# Patient Record
Sex: Male | Born: 1965
Health system: Southern US, Community
[De-identification: ages and names within clinical notes are randomized; demographics above are authoritative.]

## PROBLEM LIST (undated history)

## (undated) DIAGNOSIS — G4730 Sleep apnea, unspecified: Secondary | ICD-10-CM

## (undated) DIAGNOSIS — R0683 Snoring: Secondary | ICD-10-CM

## (undated) DIAGNOSIS — M1990 Unspecified osteoarthritis, unspecified site: Secondary | ICD-10-CM

## (undated) DIAGNOSIS — F419 Anxiety disorder, unspecified: Secondary | ICD-10-CM

## (undated) DIAGNOSIS — R569 Unspecified convulsions: Secondary | ICD-10-CM

## (undated) DIAGNOSIS — F32A Depression, unspecified: Secondary | ICD-10-CM

## (undated) DIAGNOSIS — D649 Anemia, unspecified: Secondary | ICD-10-CM

## (undated) DIAGNOSIS — T4145XA Adverse effect of unspecified anesthetic, initial encounter: Secondary | ICD-10-CM

## (undated) DIAGNOSIS — E119 Type 2 diabetes mellitus without complications: Secondary | ICD-10-CM

## (undated) DIAGNOSIS — F329 Major depressive disorder, single episode, unspecified: Secondary | ICD-10-CM

## (undated) DIAGNOSIS — T8859XA Other complications of anesthesia, initial encounter: Secondary | ICD-10-CM

## (undated) DIAGNOSIS — T7840XA Allergy, unspecified, initial encounter: Secondary | ICD-10-CM

## (undated) HISTORY — PX: HERNIA REPAIR: SHX51

## (undated) HISTORY — DX: Allergy, unspecified, initial encounter: T78.40XA

## (undated) HISTORY — PX: OTHER SURGICAL HISTORY: SHX169

## (undated) HISTORY — DX: Major depressive disorder, single episode, unspecified: F32.9

## (undated) HISTORY — DX: Anemia, unspecified: D64.9

## (undated) HISTORY — DX: Anxiety disorder, unspecified: F41.9

## (undated) HISTORY — DX: Depression, unspecified: F32.A

## (undated) HISTORY — DX: Type 2 diabetes mellitus without complications: E11.9

---

## 2006-06-21 ENCOUNTER — Encounter: Admission: RE | Admit: 2006-06-21 | Discharge: 2006-06-21 | Payer: Self-pay | Admitting: Gastroenterology

## 2007-12-03 ENCOUNTER — Encounter: Admission: RE | Admit: 2007-12-03 | Discharge: 2007-12-03 | Payer: Self-pay | Admitting: Family Medicine

## 2008-01-23 HISTORY — PX: SHOULDER SURGERY: SHX246

## 2008-01-23 HISTORY — PX: ELBOW SURGERY: SHX618

## 2008-08-05 ENCOUNTER — Emergency Department (HOSPITAL_COMMUNITY): Admission: EM | Admit: 2008-08-05 | Discharge: 2008-08-06 | Payer: Self-pay | Admitting: Emergency Medicine

## 2009-01-03 ENCOUNTER — Encounter
Admission: RE | Admit: 2009-01-03 | Discharge: 2009-01-12 | Payer: Self-pay | Admitting: Physical Medicine & Rehabilitation

## 2009-01-04 ENCOUNTER — Ambulatory Visit: Payer: Self-pay | Admitting: Physical Medicine & Rehabilitation

## 2009-01-22 HISTORY — PX: KNEE SURGERY: SHX244

## 2009-01-28 ENCOUNTER — Encounter
Admission: RE | Admit: 2009-01-28 | Discharge: 2009-04-28 | Payer: Self-pay | Admitting: Physical Medicine & Rehabilitation

## 2009-01-31 ENCOUNTER — Ambulatory Visit: Payer: Self-pay | Admitting: Physical Medicine & Rehabilitation

## 2009-05-05 ENCOUNTER — Encounter: Admission: RE | Admit: 2009-05-05 | Discharge: 2009-05-05 | Payer: Self-pay | Admitting: Rheumatology

## 2010-01-20 ENCOUNTER — Encounter: Payer: Self-pay | Admitting: Cardiovascular Disease

## 2010-01-20 ENCOUNTER — Ambulatory Visit: Admission: RE | Admit: 2010-01-20 | Discharge: 2010-01-20 | Payer: Self-pay | Source: Home / Self Care

## 2010-01-20 DIAGNOSIS — M79609 Pain in unspecified limb: Secondary | ICD-10-CM | POA: Insufficient documentation

## 2010-01-24 ENCOUNTER — Encounter: Payer: Self-pay | Admitting: Cardiovascular Disease

## 2010-02-23 NOTE — Miscellaneous (Signed)
Summary: Orders Update  Clinical Lists Changes  Problems: Added new problem of LEG PAIN, RIGHT (ICD-729.5) Orders: Added new Test order of Venous Duplex Lower Extremity (Venous Duplex Lower) - Signed

## 2010-02-28 ENCOUNTER — Other Ambulatory Visit: Payer: Self-pay | Admitting: Orthopedic Surgery

## 2010-02-28 ENCOUNTER — Ambulatory Visit
Admission: RE | Admit: 2010-02-28 | Discharge: 2010-02-28 | Disposition: A | Discharge status: Home / Self Care | Payer: BC Managed Care – PPO | Source: Ambulatory Visit | Attending: Orthopedic Surgery | Admitting: Orthopedic Surgery

## 2010-02-28 DIAGNOSIS — M79606 Pain in leg, unspecified: Secondary | ICD-10-CM

## 2010-02-28 DIAGNOSIS — R609 Edema, unspecified: Secondary | ICD-10-CM

## 2010-05-17 ENCOUNTER — Other Ambulatory Visit: Payer: Self-pay | Admitting: Family Medicine

## 2010-05-17 DIAGNOSIS — M545 Low back pain, unspecified: Secondary | ICD-10-CM

## 2010-05-23 ENCOUNTER — Other Ambulatory Visit: Payer: BC Managed Care – PPO

## 2010-06-05 ENCOUNTER — Ambulatory Visit
Admission: RE | Admit: 2010-06-05 | Discharge: 2010-06-05 | Disposition: A | Discharge status: Home / Self Care | Payer: BC Managed Care – PPO | Source: Ambulatory Visit | Attending: Family Medicine | Admitting: Family Medicine

## 2010-06-05 DIAGNOSIS — M545 Low back pain, unspecified: Secondary | ICD-10-CM

## 2011-05-16 ENCOUNTER — Emergency Department (HOSPITAL_COMMUNITY): Payer: BC Managed Care – PPO

## 2011-05-16 ENCOUNTER — Encounter (HOSPITAL_COMMUNITY): Payer: Self-pay | Admitting: Emergency Medicine

## 2011-05-16 ENCOUNTER — Emergency Department (HOSPITAL_COMMUNITY)
Admission: EM | Admit: 2011-05-16 | Discharge: 2011-05-16 | Disposition: A | Discharge status: Home Health | Payer: BC Managed Care – PPO | Attending: Emergency Medicine | Admitting: Emergency Medicine

## 2011-05-16 DIAGNOSIS — R05 Cough: Secondary | ICD-10-CM | POA: Insufficient documentation

## 2011-05-16 DIAGNOSIS — R509 Fever, unspecified: Secondary | ICD-10-CM

## 2011-05-16 DIAGNOSIS — I10 Essential (primary) hypertension: Secondary | ICD-10-CM

## 2011-05-16 DIAGNOSIS — R059 Cough, unspecified: Secondary | ICD-10-CM | POA: Insufficient documentation

## 2011-05-16 MED ORDER — ACETAMINOPHEN 500 MG PO TABS
1000.0000 mg | ORAL_TABLET | Freq: Once | ORAL | Status: AC
  Administered 2011-05-16: 1000 mg via ORAL
  Filled 2011-05-16: qty 2

## 2011-05-16 MED ORDER — AMOXICILLIN 500 MG PO CAPS: 500.0000 mg | ORAL_CAPSULE | Freq: Three times a day (TID) | ORAL | Status: AC

## 2011-05-16 NOTE — Discharge Instructions (Signed)
The x-ray did not show a definite pneumonia but it is possible that it is still too early to show up. Take antibiotics as prescribed. Follow up with your doctor if not getting any better. Return to emergency room for worsening symptoms   Fever, Adult A fever is a higher than normal body temperature. In an adult, an oral temperature around 98.6 F (37 C) is considered normal. A temperature of 100.4 F (38 C) or higher is generally considered a fever. Mild or moderate fevers generally have no long-term effects and often do not require treatment. Extreme fever (greater than or equal to 106 F or 41.1 C) can cause seizures. The sweating that may occur with repeated or prolonged fever may cause dehydration. Elderly people can develop confusion during a fever. A measured temperature can vary with:  Age.   Time of day.   Method of measurement (mouth, underarm, rectal, or ear).  The fever is confirmed by taking a temperature with a thermometer. Temperatures can be taken different ways. Some methods are accurate and some are not.  An oral temperature is used most commonly. Electronic thermometers are fast and accurate.   An ear temperature will only be accurate if the thermometer is positioned as recommended by the manufacturer.   A rectal temperature is accurate and done for those adults who have a condition where an oral temperature cannot be taken.   An underarm (axillary) temperature is not accurate and not recommended.  Fever is a symptom, not a disease.  CAUSES   Infections commonly cause fever.   Some noninfectious causes for fever include:   Some arthritis conditions.   Some thyroid or adrenal gland conditions.   Some immune system conditions.   Some types of cancer.   A medicine reaction.   High doses of certain street drugs such as methamphetamine.   Dehydration.   Exposure to high outside or room temperatures.   Occasionally, the source of a fever cannot be  determined. This is sometimes called a "fever of unknown origin" (FUO).   Some situations may lead to a temporary rise in body temperature that may go away on its own. Examples are:   Childbirth.   Surgery.   Intense exercise.  HOME CARE INSTRUCTIONS   Take appropriate medicines for fever. Follow dosing instructions carefully. If you use acetaminophen to reduce the fever, be careful to avoid taking other medicines that also contain acetaminophen. Do not take aspirin for a fever if you are younger than age 45. There is an association with Reye's syndrome. Reye's syndrome is a rare but potentially deadly disease.   If an infection is present and antibiotics have been prescribed, take them as directed. Finish them even if you start to feel better.   Rest as needed.   Maintain an adequate fluid intake. To prevent dehydration during an illness with prolonged or recurrent fever, you may need to drink extra fluid.Drink enough fluids to keep your urine clear or pale yellow.   Sponging or bathing with room temperature water may help reduce body temperature. Do not use ice water or alcohol sponge baths.   Dress comfortably, but do not over-bundle.  SEEK MEDICAL CARE IF:   You are unable to keep fluids down.   You develop vomiting or diarrhea.   You are not feeling at least partly better after 3 days.   You develop new symptoms or problems.  SEEK IMMEDIATE MEDICAL CARE IF:   You have shortness of breath or trouble breathing.  You develop excessive weakness.   You are dizzy or you faint.   You are extremely thirsty or you are making little or no urine.   You develop new pain that was not there before (such as in the head, neck, chest, back, or abdomen).   You have persistant vomiting and diarrhea for more than 1 to 2 days.   You develop a stiff neck or your eyes become sensitive to light.   You develop a skin rash.   You have a fever or persistent symptoms for more than 2 to 3  days.   You have a fever and your symptoms suddenly get worse.  MAKE SURE YOU:   Understand these instructions.   Will watch your condition.   Will get help right away if you are not doing well or get worse.  Document Released: 07/04/2000 Document Revised: 12/28/2010 Document Reviewed: 11/09/2010 [facility] Patient Information 2012 [place], [place].Pneumonia, Adult Pneumonia is an infection of the lungs.  CAUSES Pneumonia may be caused by bacteria or a virus. Usually, these infections are caused by breathing infectious particles into the lungs (respiratory tract). SYMPTOMS   Cough.   Fever.   Chest pain.   Increased rate of breathing.   Wheezing.   Mucus production.  DIAGNOSIS  If you have the common symptoms of pneumonia, your caregiver will typically confirm the diagnosis with a chest X-ray. The X-ray will show an abnormality in the lung (pulmonary infiltrate) if you have pneumonia. Other tests of your blood, urine, or sputum may be done to find the specific cause of your pneumonia. Your caregiver may also do tests (blood gases or pulse oximetry) to see how well your lungs are working. TREATMENT  Some forms of pneumonia may be spread to other people when you cough or sneeze. You may be asked to wear a mask before and during your exam. Pneumonia that is caused by bacteria is treated with antibiotic medicine. Pneumonia that is caused by the influenza virus may be treated with an antiviral medicine. Most other viral infections must run their course. These infections will not respond to antibiotics.  PREVENTION A pneumococcal shot (vaccine) is available to prevent a common bacterial cause of pneumonia. This is usually suggested for:  People over 50 years old.   Patients on chemotherapy.   People with chronic lung problems, such as bronchitis or emphysema.   People with immune system problems.  If you are over 65 or have a high risk condition, you may receive the pneumococcal  vaccine if you have not received it before. In some countries, a routine influenza vaccine is also recommended. This vaccine can help prevent some cases of pneumonia.You may be offered the influenza vaccine as part of your care. If you smoke, it is time to quit. You may receive instructions on how to stop smoking. Your caregiver can provide medicines and counseling to help you quit. HOME CARE INSTRUCTIONS   Cough suppressants may be used if you are losing too much rest. However, coughing protects you by clearing your lungs. You should avoid using cough suppressants if you can.   Your caregiver may have prescribed medicine if he or she thinks your pneumonia is caused by a bacteria or influenza. Finish your medicine even if you start to feel better.   Your caregiver may also prescribe an expectorant. This loosens the mucus to be coughed up.   Only take over-the-counter or prescription medicines for pain, discomfort, or fever as directed by your caregiver.  Do not smoke. Smoking is a common cause of bronchitis and can contribute to pneumonia. If you are a smoker and continue to smoke, your cough may last several weeks after your pneumonia has cleared.   A cold steam vaporizer or humidifier in your room or home may help loosen mucus.   Coughing is often worse at night. Sleeping in a semi-upright position in a recliner or using a couple pillows under your head will help with this.   Get rest as you feel it is needed. Your body will usually let you know when you need to rest.  SEEK IMMEDIATE MEDICAL CARE IF:   Your illness becomes worse. This is especially true if you are elderly or weakened from any other disease.   You cannot control your cough with suppressants and are losing sleep.   You begin coughing up blood.   You develop pain which is getting worse or is uncontrolled with medicines.   You have a fever.   Any of the symptoms which initially brought you in for treatment are getting  worse rather than better.   You develop shortness of breath or chest pain.  MAKE SURE YOU:   Understand these instructions.   Will watch your condition.   Will get help right away if you are not doing well or get worse.  Document Released: 01/08/2005 Document Revised: 12/28/2010 Document Reviewed: 03/30/2010 [facility] Patient Information 2012 [place], [place].

## 2011-05-16 NOTE — ED Notes (Signed)
AVW:[id number]<XB> Expected date:05/16/11<BR> Expected time:<BR> Means of arrival:<BR> Comments:<BR> EMS 80 GC - fever

## 2011-05-16 NOTE — ED Notes (Signed)
Pt states he has had a temp of 104 for 4 days. Has taken tylenol, ibuprofen, etc... But fever continues to return. Burns up and sweats and has chills. Has been drinking water non-stop. Went to PCP today but states that they said it could just be bronchitis. States he just got back from [place] and there was a hispanic man infront of him that was coughing continually. Last ibuprofen was at 805

## 2011-05-16 NOTE — ED Provider Notes (Signed)
History     CSN: [id number]  Arrival date & time 05/16/11  2044   First MD Initiated Contact with Patient 05/16/11 2058      Chief Complaint  Patient presents with  . Fever     HPI Pt has been having fever for the last few days.  Pt went to see his doctor today and was started empricially on a z pack.  He has been having some cough.  No vomiting, diarrhea, dysuria.  No headache or neck stiffness.  No foreign travel.  He was exposed to a man that was coughing a lot [place] the [place].  Pt normally does not have high blood pressure.  He has been taking ibuprofen without relief. History reviewed. No pertinent past medical history.  Past Surgical History  Procedure Date  . Knee surgery   . Back surgery     No family history on file.  History  Substance Use Topics  . Smoking status: Not on file  . Smokeless tobacco: Not on file  . Alcohol Use:       Review of Systems  All other systems reviewed and are negative.    Allergies  Review of patient's allergies indicates no known allergies.  Home Medications  No current outpatient prescriptions on file.  BP 200/20  Pulse 110  Temp(Src) 102.8 F (39.3 C) (Tympanic)  Resp 18  SpO2 95%  Physical Exam  Nursing note and vitals reviewed. Constitutional: He appears well-developed and well-nourished. No distress.  HENT:  Head: Normocephalic and atraumatic.  Right Ear: External ear normal.  Left Ear: External ear normal.  Eyes: Conjunctivae are normal. Right eye exhibits no discharge. Left eye exhibits no discharge. No scleral icterus.  Neck: Neck supple. No tracheal deviation present.  Cardiovascular: Normal rate, regular rhythm and intact distal pulses.   Pulmonary/Chest: Effort normal. No stridor. No respiratory distress. He has no wheezes. He has rales in the left middle field and the left lower field.  Abdominal: Soft. Bowel sounds are normal. He exhibits no distension. There is no tenderness. There is no rebound and no  guarding.  Musculoskeletal: He exhibits no edema and no tenderness.  Neurological: He is alert. He has normal strength. No sensory deficit. Cranial nerve deficit:  no gross defecits noted. He exhibits normal muscle tone. He displays no seizure activity. Coordination normal.  Skin: Skin is warm and dry. No rash noted.  Psychiatric: He has a normal mood and affect.    ED Course  Procedures (including critical care time)  Labs Reviewed - No data to display Dg Chest 2 View  05/16/2011  *RADIOLOGY REPORT*  Clinical Data: Fever, cough, and shortness of breath for 2 days. Smoker.  CHEST - 2 VIEW  Comparison: None.  Findings: Normal heart size and pulmonary vascularity.  Patchy central interstitial changes in the lungs which might represent fibrosis or chronic bronchitic changes.  No focal airspace consolidation in the lungs.  No blunting of costophrenic angles. No pneumothorax.  IMPRESSION: Fibrosis versus chronic bronchitic changes in the lungs.  No evidence of active pulmonary disease.  Original Report Authenticated By: Marlon Pel, M.D.     1. Fever       MDM  Clinically the patient appears to have a left lower lobe pneumonia. He has crackles on exam in the left lower lung.  He has been having fevers as well as a cough. Patient denies any other symptoms to suggest an alternative etiology. He was noted to be hypertensive here in  the emergency room. He is asymptomatic with this and I have encouraged to follow up with his PCP about this.        Celene Kras, MD 05/16/11 2258

## 2011-10-29 ENCOUNTER — Encounter (HOSPITAL_COMMUNITY): Payer: Self-pay | Admitting: *Deleted

## 2011-10-29 ENCOUNTER — Observation Stay (HOSPITAL_COMMUNITY)
Admission: EM | Admit: 2011-10-29 | Discharge: 2011-10-31 | Disposition: A | Discharge status: Home / Self Care | Payer: BC Managed Care – PPO | Attending: Internal Medicine | Admitting: Internal Medicine

## 2011-10-29 DIAGNOSIS — M549 Dorsalgia, unspecified: Secondary | ICD-10-CM | POA: Insufficient documentation

## 2011-10-29 DIAGNOSIS — F17200 Nicotine dependence, unspecified, uncomplicated: Secondary | ICD-10-CM | POA: Insufficient documentation

## 2011-10-29 DIAGNOSIS — R739 Hyperglycemia, unspecified: Secondary | ICD-10-CM | POA: Diagnosis present

## 2011-10-29 DIAGNOSIS — J189 Pneumonia, unspecified organism: Principal | ICD-10-CM | POA: Diagnosis present

## 2011-10-29 DIAGNOSIS — M79609 Pain in unspecified limb: Secondary | ICD-10-CM

## 2011-10-29 DIAGNOSIS — Z79899 Other long term (current) drug therapy: Secondary | ICD-10-CM | POA: Insufficient documentation

## 2011-10-29 DIAGNOSIS — E119 Type 2 diabetes mellitus without complications: Secondary | ICD-10-CM | POA: Diagnosis present

## 2011-10-29 DIAGNOSIS — IMO0002 Reserved for concepts with insufficient information to code with codable children: Secondary | ICD-10-CM | POA: Insufficient documentation

## 2011-10-29 DIAGNOSIS — G8929 Other chronic pain: Secondary | ICD-10-CM | POA: Insufficient documentation

## 2011-10-29 DIAGNOSIS — J441 Chronic obstructive pulmonary disease with (acute) exacerbation: Secondary | ICD-10-CM | POA: Insufficient documentation

## 2011-10-29 LAB — COMPREHENSIVE METABOLIC PANEL
ALT: 66 U/L — ABNORMAL HIGH (ref 0–53)
Albumin: 3.9 g/dL (ref 3.5–5.2)
Alkaline Phosphatase: 133 U/L — ABNORMAL HIGH (ref 39–117)
BUN: 7 mg/dL (ref 6–23)
CO2: 29 mEq/L (ref 19–32)
Calcium: 9.2 mg/dL (ref 8.4–10.5)
Chloride: 99 mEq/L (ref 96–112)
Creatinine, Ser: 0.85 mg/dL (ref 0.50–1.35)
Total Bilirubin: 0.6 mg/dL (ref 0.3–1.2)
Total Protein: 7.4 g/dL (ref 6.0–8.3)

## 2011-10-29 LAB — CBC WITH DIFFERENTIAL/PLATELET
Lymphocytes Relative: 14 % (ref 12–46)
Lymphs Abs: 1.6 10*3/uL (ref 0.7–4.0)
MCH: 31 pg (ref 26.0–34.0)
MCHC: 34.3 g/dL (ref 30.0–36.0)
Monocytes Absolute: 0.6 10*3/uL (ref 0.1–1.0)
Platelets: 145 10*3/uL — ABNORMAL LOW (ref 150–400)
RBC: 4.77 MIL/uL (ref 4.22–5.81)
RDW: 12.9 % (ref 11.5–15.5)
WBC: 12 10*3/uL — ABNORMAL HIGH (ref 4.0–10.5)

## 2011-10-29 NOTE — ED Notes (Signed)
Pulse ox consistently staying between 96-97% while ambulating

## 2011-10-29 NOTE — ED Notes (Signed)
Chest pain for 2 weeks he has been seen at numerus places for the same and was seen today at [facility] walk-in and was diagnosed with pneumonia.

## 2011-10-29 NOTE — ED Provider Notes (Signed)
History     CSN: [id number]  Arrival date & time 10/29/11  1943   First MD Initiated Contact with Patient 10/29/11 2259      Chief Complaint  Patient presents with  . Chest Pain    (Consider location/radiation/quality/duration/timing/severity/associated sxs/prior treatment) HPI HX per PT cough and not feeling well last week, went to [place] walk in clinic and had an xray, was told he has bronchitis and started on Avelox. Finished RX and today woke with HA, temp to 103 and mild cough. He saw PCP at [facility] again, had another xray. PCP called, told him he has PNA and was prescribed Augmentin. Tonight labored breathing and L sided sharp CP worse with  Breathing, called PCP and referred here. Using albuterol with no sig relief. Has arthritis no other medical issues.    History reviewed. No pertinent past medical history.  Past Surgical History  Procedure Date  . Knee surgery   . Back surgery     No family history on file.  History  Substance Use Topics  . Smoking status: Current Every Day Smoker  . Smokeless tobacco: Not on file  . Alcohol Use: No      Review of Systems  Constitutional: Negative for fever and chills.  HENT: Negative for neck pain and neck stiffness.   Eyes: Negative for pain.  Respiratory: Positive for cough and shortness of breath.   Cardiovascular: Positive for chest pain.  Gastrointestinal: Negative for vomiting and abdominal pain.  Genitourinary: Negative for dysuria.  Musculoskeletal: Negative for back pain.  Skin: Negative for rash.  Neurological: Negative for headaches.  All other systems reviewed and are negative.    Allergies  Indocin and Zanaflex  Home Medications   Current Outpatient Rx  Name Route Sig Dispense Refill  . ALBUTEROL SULFATE HFA 108 (90 BASE) MCG/ACT IN AERS Inhalation Inhale 2 puffs into the lungs every 6 (six) hours as needed. For shortness of breath    . ALLOPURINOL 300 MG PO TABS Oral Take 300 mg by mouth daily.    Marland Kitchen  ALPRAZOLAM 1 MG PO TABS Oral Take 1 mg by mouth at bedtime as needed. For anxiety    . AMOXICILLIN-POT CLAVULANATE 875-125 MG PO TABS Oral Take 1 tablet by mouth 2 (two) times daily.    . BUMETANIDE 0.5 MG PO TABS Oral Take 0.5 mg by mouth daily.    . CYCLOBENZAPRINE HCL 10 MG PO TABS Oral Take 10 mg by mouth 3 (three) times daily as needed. For muscle spasms    . DULOXETINE HCL 60 MG PO CPEP Oral Take 60 mg by mouth 2 (two) times daily.    Marland Kitchen MIRTAZAPINE 30 MG PO TABS Oral Take 30 mg by mouth at bedtime.    . OXYCODONE HCL ER 80 MG PO TB12 Oral Take 160 mg by mouth every 12 (twelve) hours.    . OXYCODONE HCL 30 MG PO TABS Oral Take 30 mg by mouth every 4 (four) hours as needed. For breakthrough pain    . PREDNISONE 5 MG PO TABS Oral Take 15 mg by mouth daily.      BP 146/81  Pulse 95  Temp 98 F (36.7 C) (Oral)  Resp 20  SpO2 92%  Physical Exam  Constitutional: He is oriented to person, place, and time. He appears well-developed and well-nourished.  HENT:  Head: Normocephalic and atraumatic.  Eyes: Conjunctivae normal and EOM are normal. Pupils are equal, round, and reactive to light.  Neck: Trachea normal. Neck  supple. No thyromegaly present.  Cardiovascular: Normal rate, regular rhythm, S1 normal, S2 normal and normal pulses.     No systolic murmur is present   No diastolic murmur is present  Pulses:      Radial pulses are 2+ on the right side, and 2+ on the left side.  Pulmonary/Chest: Effort normal. He has no wheezes. He has no rhonchi. He has no rales. He exhibits no tenderness.       Course bilateral breath sounds  Abdominal: Soft. Normal appearance and bowel sounds are normal. There is no tenderness. There is no rebound, no guarding, no CVA tenderness and negative Murphy's sign.  Musculoskeletal:       BLE:s Calves nontender, no cords or erythema, negative Homans sign  Neurological: He is alert and oriented to person, place, and time. He has normal strength. No cranial nerve  deficit or sensory deficit. GCS eye subscore is 4. GCS verbal subscore is 5. GCS motor subscore is 6.  Skin: Skin is warm and dry. No rash noted. He is not diaphoretic.  Psychiatric: His speech is normal.       Cooperative and appropriate    ED Course  Procedures (including critical care time)  Results for orders placed during the hospital encounter of 10/29/11  CBC WITH DIFFERENTIAL      Component Value Range   WBC 12.0 (*) 4.0 - 10.5 K/uL   RBC 4.77  4.22 - 5.81 MIL/uL   Hemoglobin 14.8  13.0 - 17.0 g/dL   HCT 19.1  47.8 - 29.5 %   MCV 90.4  78.0 - 100.0 fL   MCH 31.0  26.0 - 34.0 pg   MCHC 34.3  30.0 - 36.0 g/dL   RDW 62.1  30.8 - 65.7 %   Platelets 145 (*) 150 - 400 K/uL   Neutrophils Relative 80 (*) 43 - 77 %   Neutro Abs 9.7 (*) 1.7 - 7.7 K/uL   Lymphocytes Relative 14  12 - 46 %   Lymphs Abs 1.6  0.7 - 4.0 K/uL   Monocytes Relative 5  3 - 12 %   Monocytes Absolute 0.6  0.1 - 1.0 K/uL   Eosinophils Relative 1  0 - 5 %   Eosinophils Absolute 0.1  0.0 - 0.7 K/uL   Basophils Relative 0  0 - 1 %   Basophils Absolute 0.0  0.0 - 0.1 K/uL  COMPREHENSIVE METABOLIC PANEL      Component Value Range   Sodium 138  135 - 145 mEq/L   Potassium 4.2  3.5 - 5.1 mEq/L   Chloride 99  96 - 112 mEq/L   CO2 29  19 - 32 mEq/L   Glucose, Bld 260 (*) 70 - 99 mg/dL   BUN 7  6 - 23 mg/dL   Creatinine, Ser 8.46  0.50 - 1.35 mg/dL   Calcium 9.2  8.4 - 96.2 mg/dL   Total Protein 7.4  6.0 - 8.3 g/dL   Albumin 3.9  3.5 - 5.2 g/dL   AST 49 (*) 0 - 37 U/L   ALT 66 (*) 0 - 53 U/L   Alkaline Phosphatase 133 (*) 39 - 117 U/L   Total Bilirubin 0.6  0.3 - 1.2 mg/dL   GFR calc non Af Amer >90  >90 mL/min   GFR calc Af Amer >90  >90 mL/min  POCT I-STAT TROPONIN I      Component Value Range   Troponin i, poc 0.00  0.00 - 0.08 ng/mL  Comment 3            Ct Angio Chest Pe W/cm &/or Wo Cm  10/30/2011  *RADIOLOGY REPORT*  Clinical Data: Chest pain shortness of breath, recent diagnosis of pneumonia   CT ANGIOGRAPHY CHEST  Technique:  Multidetector CT imaging of the chest using the standard protocol during bolus administration of intravenous contrast. Multiplanar reconstructed images including MIPs were obtained and reviewed to evaluate the vascular anatomy.  Contrast: OMNIPAQUE IOHEXOL 350 MG/ML SOLN  Comparison: Chest radiograph - 10/29/2011; 10/14/2011  Vascular Findings:  There is adequate opacification of the pulmonary vasculature in the main pulmonary artery measuring 250 HU.  There are no discrete filling defects within the pulmonary arterial tree to the level of the bilateral subsegmental pulmonary arteries.  Evaluation of the distal subsegmental pulmonary arteries is degraded secondary to suboptimal vessel opacification.  Normal caliber of the main pulmonary artery.  Normal heart size.  No pericardial effusion.  Normal caliber of the thoracic aorta.  Normal configuration of the aortic arch. Visualized portions of the cervical vasculature are patent.  No thoracic aortic dissection.  No periaortic stranding.  Nonvascular findings:  Ill-defined heterogeneous air space opacities within the left lower lung (images 57, 67 and 68, series 6) correlate with the findings on recent chest radiograph and are worrisome for infection. Bibasilar dependent ground-glass opacities compatible with atelectasis.  No pleural effusion or pneumothorax.  The central airways appear patent.  Scattered shoddy mediastinal lymph nodes are not enlarged by CT criteria with index pretracheal node measuring 8 mm in short axis diameter (image 34, series 5), not enlarged by CT criteria. Borderline enlarged right hilar lymph node measures approximately 1.1 cm in short axis diameter (image 41).  No definite left hilar adenopathy.  No axillary adenopathy.  Incidental imaging of the upper abdomen demonstrates diffuse decreased attenuation of the hepatic parenchyma suggestive of hepatic steatosis.  Possible sub centimeter nodule within the  left lobe of the thyroid lobe ( image 5).  No acute or aggressive osseous abnormalities.  IMPRESSION:  1.  Negative for pulmonary embolism to the level of the bilateral subsegmental pulmonary arteries. 2.  Ill-defined left lower lobe heterogeneous air space opacities correlate with the findings on recent chest radiograph and are worrisome for infection.   A follow-up chest radiograph in 4 to 6 weeks after treatment is recommended to ensure resolution.  3.  Shoddy mediastinal and right hilar lymph nodes, presumably reactive in etiology. 4.  Hepatic steatosis suspected.   Original Report Authenticated By: Waynard Reeds, M.D.      Date: 10/29/2011  Rate: 89  Rhythm: normal sinus rhythm  QRS Axis: normal  Intervals: normal  ST/T Wave abnormalities: nonspecific ST changes  Conduction Disutrbances:none  Narrative Interpretation:   Old EKG Reviewed: none available  Labs reviewed - PT aware he has had elevated LFTs in the past, no ABD pain or RUQ pain.   Labs and imaging reviewed. 2:23 AM d/w [facility] hospitalist on call, concern for PNA despite full course of avelox - failed outpatient management. Plan admit IV ABx. Bed request placed, DR Onalee Hua to evaluate in the ED.   MDM   Cough, fever, finished Avelox and now placed on Augment for PNA. Temp 103 at home. CT and labs as above. VS and nursing notes reviewed.         Sunnie Nielsen, MD 10/30/11 [phone]

## 2011-10-30 ENCOUNTER — Emergency Department (HOSPITAL_COMMUNITY): Payer: BC Managed Care – PPO

## 2011-10-30 ENCOUNTER — Encounter (HOSPITAL_COMMUNITY): Payer: Self-pay | Admitting: Radiology

## 2011-10-30 DIAGNOSIS — M79609 Pain in unspecified limb: Secondary | ICD-10-CM

## 2011-10-30 DIAGNOSIS — J189 Pneumonia, unspecified organism: Secondary | ICD-10-CM | POA: Diagnosis present

## 2011-10-30 LAB — GLUCOSE, CAPILLARY: Glucose-Capillary: 116 mg/dL — ABNORMAL HIGH (ref 70–99)

## 2011-10-30 LAB — LEGIONELLA ANTIGEN, URINE

## 2011-10-30 LAB — CBC WITH DIFFERENTIAL/PLATELET
Eosinophils Absolute: 0.1 10*3/uL (ref 0.0–0.7)
Eosinophils Relative: 1 % (ref 0–5)
Hemoglobin: 13.9 g/dL (ref 13.0–17.0)
Lymphs Abs: 3.2 10*3/uL (ref 0.7–4.0)
MCH: 31.2 pg (ref 26.0–34.0)
MCV: 90.8 fL (ref 78.0–100.0)
Monocytes Absolute: 0.6 10*3/uL (ref 0.1–1.0)
Monocytes Relative: 6 % (ref 3–12)
RBC: 4.46 MIL/uL (ref 4.22–5.81)

## 2011-10-30 LAB — BASIC METABOLIC PANEL
CO2: 27 mEq/L (ref 19–32)
Glucose, Bld: 197 mg/dL — ABNORMAL HIGH (ref 70–99)
Potassium: 3.7 mEq/L (ref 3.5–5.1)
Sodium: 136 mEq/L (ref 135–145)

## 2011-10-30 LAB — HEMOGLOBIN A1C: Mean Plasma Glucose: 154 mg/dL — ABNORMAL HIGH (ref <117)

## 2011-10-30 MED ORDER — MIRTAZAPINE 30 MG PO TABS
30.0000 mg | ORAL_TABLET | Freq: Every day | ORAL | Status: DC
  Administered 2011-10-30: 30 mg via ORAL
  Filled 2011-10-30 (×2): qty 1

## 2011-10-30 MED ORDER — INSULIN ASPART 100 UNIT/ML ~~LOC~~ SOLN
0.0000 [IU] | Freq: Three times a day (TID) | SUBCUTANEOUS | Status: DC
  Administered 2011-10-31: 2 [IU] via SUBCUTANEOUS

## 2011-10-30 MED ORDER — PREDNISONE 10 MG PO TABS
15.0000 mg | ORAL_TABLET | Freq: Every day | ORAL | Status: DC
Start: 2011-10-30 — End: 2011-10-30
  Administered 2011-10-30: 15 mg via ORAL
  Filled 2011-10-30 (×2): qty 1

## 2011-10-30 MED ORDER — SODIUM CHLORIDE 0.9 % IJ SOLN
3.0000 mL | Freq: Two times a day (BID) | INTRAMUSCULAR | Status: DC
  Administered 2011-10-30: 3 mL via INTRAVENOUS

## 2011-10-30 MED ORDER — OXYCODONE HCL 40 MG PO TB12
160.0000 mg | ORAL_TABLET | Freq: Two times a day (BID) | ORAL | Status: DC
  Administered 2011-10-30 – 2011-10-31 (×3): 160 mg via ORAL
  Filled 2011-10-30 (×3): qty 4

## 2011-10-30 MED ORDER — VANCOMYCIN HCL 1000 MG IV SOLR
1250.0000 mg | Freq: Three times a day (TID) | INTRAVENOUS | Status: DC
  Filled 2011-10-30 (×2): qty 1250

## 2011-10-30 MED ORDER — CYCLOBENZAPRINE HCL 10 MG PO TABS
10.0000 mg | ORAL_TABLET | Freq: Three times a day (TID) | ORAL | Status: DC | PRN
  Administered 2011-10-30: 10 mg via ORAL
  Filled 2011-10-30: qty 1

## 2011-10-30 MED ORDER — BUMETANIDE 0.5 MG PO TABS
0.5000 mg | ORAL_TABLET | Freq: Every day | ORAL | Status: DC
  Administered 2011-10-30 – 2011-10-31 (×2): 0.5 mg via ORAL
  Filled 2011-10-30 (×2): qty 1

## 2011-10-30 MED ORDER — AZITHROMYCIN 500 MG IV SOLR
500.0000 mg | INTRAVENOUS | Status: DC
  Administered 2011-10-30: 500 mg via INTRAVENOUS
  Filled 2011-10-30 (×2): qty 500

## 2011-10-30 MED ORDER — PREDNISONE 20 MG PO TABS
40.0000 mg | ORAL_TABLET | Freq: Every day | ORAL | Status: DC
  Administered 2011-10-31: 40 mg via ORAL
  Filled 2011-10-30 (×3): qty 2

## 2011-10-30 MED ORDER — ALPRAZOLAM 0.5 MG PO TABS
1.0000 mg | ORAL_TABLET | Freq: Every evening | ORAL | Status: DC | PRN
  Administered 2011-10-30: 1 mg via ORAL
  Filled 2011-10-30: qty 2

## 2011-10-30 MED ORDER — SODIUM CHLORIDE 0.9 % IV SOLN
250.0000 mL | INTRAVENOUS | Status: DC | PRN
  Administered 2011-10-30: 250 mL via INTRAVENOUS

## 2011-10-30 MED ORDER — ALLOPURINOL 300 MG PO TABS
300.0000 mg | ORAL_TABLET | Freq: Every day | ORAL | Status: DC
  Administered 2011-10-30 – 2011-10-31 (×2): 300 mg via ORAL
  Filled 2011-10-30 (×2): qty 1

## 2011-10-30 MED ORDER — OXYCODONE HCL 5 MG PO TABS: 30.0000 mg | ORAL_TABLET | ORAL | Status: DC | PRN

## 2011-10-30 MED ORDER — IOHEXOL 350 MG/ML SOLN
100.0000 mL | Freq: Once | INTRAVENOUS | Status: AC | PRN
  Administered 2011-10-30: 100 mL via INTRAVENOUS

## 2011-10-30 MED ORDER — CEFTRIAXONE SODIUM 1 G IJ SOLR
1.0000 g | INTRAMUSCULAR | Status: DC
  Administered 2011-10-30: 1 g via INTRAVENOUS
  Filled 2011-10-30 (×2): qty 10

## 2011-10-30 MED ORDER — SODIUM CHLORIDE 0.9 % IJ SOLN: 3.0000 mL | INTRAMUSCULAR | Status: DC | PRN

## 2011-10-30 MED ORDER — SODIUM CHLORIDE 0.9 % IV SOLN: INTRAVENOUS | Status: DC

## 2011-10-30 MED ORDER — DULOXETINE HCL 60 MG PO CPEP
60.0000 mg | ORAL_CAPSULE | Freq: Two times a day (BID) | ORAL | Status: DC
  Administered 2011-10-30 – 2011-10-31 (×3): 60 mg via ORAL
  Filled 2011-10-30 (×4): qty 1

## 2011-10-30 MED ORDER — OXYCODONE HCL 80 MG PO TB12: 160.0000 mg | ORAL_TABLET | Freq: Two times a day (BID) | ORAL | Status: DC

## 2011-10-30 MED ORDER — VANCOMYCIN HCL IN DEXTROSE 1-5 GM/200ML-% IV SOLN: 1000.0000 mg | Freq: Once | INTRAVENOUS | Status: DC

## 2011-10-30 MED ORDER — PIPERACILLIN-TAZOBACTAM 3.375 G IVPB: 3.3750 g | Freq: Once | INTRAVENOUS | Status: DC

## 2011-10-30 MED ORDER — VANCOMYCIN HCL 1000 MG IV SOLR
2000.0000 mg | Freq: Once | INTRAVENOUS | Status: AC
  Administered 2011-10-30: 2000 mg via INTRAVENOUS
  Filled 2011-10-30: qty 2000

## 2011-10-30 MED ORDER — PIPERACILLIN-TAZOBACTAM 3.375 G IVPB
3.3750 g | Freq: Three times a day (TID) | INTRAVENOUS | Status: DC
  Administered 2011-10-30: 3.375 g via INTRAVENOUS
  Filled 2011-10-30 (×3): qty 50

## 2011-10-30 NOTE — H&P (Signed)
PCP:   Lenora Boys, MD   Chief Complaint:  Fever, cough, cp  HPI: 46 yo male tx for pna with 10 days of avelox finished several days ago comes in with fever 103 yesterday and general malaise since yesterday.  Ct chest today shows pna.  No other med issues except arthritis.  Otherwise doing well.  No n/v/d.  No abd pain.  Some mild pleuritic cp associated with cough.    Review of Systems:  O/w neg  Past Medical History: History reviewed. No pertinent past medical history. Past Surgical History  Procedure Date  . Knee surgery   . Back surgery     Medications: Prior to Admission medications   Medication Sig Start Date End Date Taking? Authorizing Provider  albuterol (PROVENTIL HFA;VENTOLIN HFA) 108 (90 BASE) MCG/ACT inhaler Inhale 2 puffs into the lungs every 6 (six) hours as needed. For shortness of breath   Yes Historical Provider, MD  allopurinol (ZYLOPRIM) 300 MG tablet Take 300 mg by mouth daily.   Yes Historical Provider, MD  ALPRAZolam Prudy Feeler) 1 MG tablet Take 1 mg by mouth at bedtime as needed. For anxiety   Yes Historical Provider, MD  amoxicillin-clavulanate (AUGMENTIN) 875-125 MG per tablet Take 1 tablet by mouth 2 (two) times daily.   Yes Historical Provider, MD  bumetanide (BUMEX) 0.5 MG tablet Take 0.5 mg by mouth daily.   Yes Historical Provider, MD  cyclobenzaprine (FLEXERIL) 10 MG tablet Take 10 mg by mouth 3 (three) times daily as needed. For muscle spasms   Yes Historical Provider, MD  DULoxetine (CYMBALTA) 60 MG capsule Take 60 mg by mouth 2 (two) times daily.   Yes Historical Provider, MD  mirtazapine (REMERON) 30 MG tablet Take 30 mg by mouth at bedtime.   Yes Historical Provider, MD  oxyCODONE (OXYCONTIN) 80 MG 12 hr tablet Take 160 mg by mouth every 12 (twelve) hours.   Yes Historical Provider, MD  oxycodone (ROXICODONE) 30 MG immediate release tablet Take 30 mg by mouth every 4 (four) hours as needed. For breakthrough pain   Yes Historical Provider, MD    predniSONE (DELTASONE) 5 MG tablet Take 15 mg by mouth daily.   Yes Historical Provider, MD    Allergies:   Allergies  Allergen Reactions  . Indocin (Indomethacin) Nausea And Vomiting  . Zanaflex (Tizanidine Hcl) Swelling    Social History:  reports that he has been smoking.  He does not have any smokeless tobacco history on file. He reports that he does not drink alcohol. His drug history not on file.  Physical Exam: Filed Vitals:   10/29/11 1949 10/30/11 0040  BP: 146/81 121/73  Pulse: 95 68  Temp: 98 F (36.7 C) 98 F (36.7 C)  TempSrc: Oral Oral  Resp: 20 20  SpO2: 92% 93%   General appearance: alert, cooperative and no distress Lungs: clear to auscultation bilaterally Heart: regular rate and rhythm, S1, S2 normal, no murmur, click, rub or gallop Abdomen: soft, non-tender; bowel sounds normal; no masses,  no organomegaly Extremities: extremities normal, atraumatic, no cyanosis or edema Pulses: 2+ and symmetric Skin: Skin color, texture, turgor normal. No rashes or lesions Neurologic: Grossly normal    Labs on Admission:   [facility] 10/29/11 2005  NA 138  K 4.2  CL 99  CO2 29  GLUCOSE 260*  BUN 7  CREATININE 0.85  CALCIUM 9.2  MG --  PHOS --    Basename 10/29/11 2005  AST 49*  ALT 66*  ALKPHOS  133*  BILITOT 0.6  PROT 7.4  ALBUMIN 3.9    Basename 10/29/11 2005  WBC 12.0*  NEUTROABS 9.7*  HGB 14.8  HCT 43.1  MCV 90.4  PLT 145*   Radiological Exams on Admission: Ct Angio Chest Pe W/cm &/or Wo Cm  10/30/2011  *RADIOLOGY REPORT*  Clinical Data: Chest pain shortness of breath, recent diagnosis of pneumonia  CT ANGIOGRAPHY CHEST  Technique:  Multidetector CT imaging of the chest using the standard protocol during bolus administration of intravenous contrast. Multiplanar reconstructed images including MIPs were obtained and reviewed to evaluate the vascular anatomy.  Contrast: OMNIPAQUE IOHEXOL 350 MG/ML SOLN  Comparison: Chest radiograph -  10/29/2011; 10/14/2011  Vascular Findings:  There is adequate opacification of the pulmonary vasculature in the main pulmonary artery measuring 250 HU.  There are no discrete filling defects within the pulmonary arterial tree to the level of the bilateral subsegmental pulmonary arteries.  Evaluation of the distal subsegmental pulmonary arteries is degraded secondary to suboptimal vessel opacification.  Normal caliber of the main pulmonary artery.  Normal heart size.  No pericardial effusion.  Normal caliber of the thoracic aorta.  Normal configuration of the aortic arch. Visualized portions of the cervical vasculature are patent.  No thoracic aortic dissection.  No periaortic stranding.  Nonvascular findings:  Ill-defined heterogeneous air space opacities within the left lower lung (images 57, 67 and 68, series 6) correlate with the findings on recent chest radiograph and are worrisome for infection. Bibasilar dependent ground-glass opacities compatible with atelectasis.  No pleural effusion or pneumothorax.  The central airways appear patent.  Scattered shoddy mediastinal lymph nodes are not enlarged by CT criteria with index pretracheal node measuring 8 mm in short axis diameter (image 34, series 5), not enlarged by CT criteria. Borderline enlarged right hilar lymph node measures approximately 1.1 cm in short axis diameter (image 41).  No definite left hilar adenopathy.  No axillary adenopathy.  Incidental imaging of the upper abdomen demonstrates diffuse decreased attenuation of the hepatic parenchyma suggestive of hepatic steatosis.  Possible sub centimeter nodule within the left lobe of the thyroid lobe ( image 5).  No acute or aggressive osseous abnormalities.  IMPRESSION:  1.  Negative for pulmonary embolism to the level of the bilateral subsegmental pulmonary arteries. 2.  Ill-defined left lower lobe heterogeneous air space opacities correlate with the findings on recent chest radiograph and are worrisome  for infection.   A follow-up chest radiograph in 4 to 6 weeks after treatment is recommended to ensure resolution.  3.  Shoddy mediastinal and right hilar lymph nodes, presumably reactive in etiology. 4.  Hepatic steatosis suspected.   Original Report Authenticated By: Waynard Reeds, M.D.     Assessment/Plan Present on Admission:  46 yo male with failed outp tx pna .PNA (pneumonia)  Broaden abx to vanco/zosyn.  Blood cx done.  vss all stable.  Obtain sputum sample.  Ck hiv.  pna pathway.   DAVID,RACHAL A [id number] 10/30/2011, 2:38 AM

## 2011-10-30 NOTE — Progress Notes (Signed)
Sent page to [facility]. Pt has no history of Diabetes. CBG on arrival to floor 197. I am asking if they want to start SSI orders. Pt did state he has been on 80 mg of prednisone for 8 days prior to admission.

## 2011-10-30 NOTE — Progress Notes (Signed)
Inpatient Diabetes Program Recommendations  AACE/ADA: New Consensus Statement on Inpatient Glycemic Control (2013)  Target Ranges:  Prepandial:   less than 140 mg/dL      Peak postprandial:   less than 180 mg/dL (1-2 hours)      Critically ill patients:  140 - 180 mg/dL   Reason for Visit: Results for Brandon Miller, Brandon Miller (MRN [id number]) as of 10/30/2011 11:13  Ref. Range 10/29/2011 20:05 10/30/2011 04:45  Glucose Latest Range: 70-99 mg/dL 409 (H) 811 (H)   Note Lab glucoses elevated.  No previous history of diabetes noted.  Please check A1C and consider checking CBG's tid with meals and HS.  If CBG's greater than 150 mg/dL, please add Novolog moderate correction.   Will follow.

## 2011-10-30 NOTE — Progress Notes (Signed)
ANTIBIOTIC CONSULT NOTE - INITIAL  Pharmacy Consult for Vancomycin and Zosyn  Indication: pneumonia  Allergies  Allergen Reactions  . Indocin (Indomethacin) Nausea And Vomiting  . Zanaflex (Tizanidine Hcl) Swelling    Patient Measurements: Height: 6\' 3"  (190.5 cm) Weight: 269 lb 12.8 oz (122.38 kg) IBW/kg (Calculated) : 84.5  Adjusted Body Weight: 100 kg  Vital Signs: Temp: 98.1 F (36.7 C) (10/08 0318) Temp src: Oral (10/08 0318) BP: 128/75 mmHg (10/08 0318) Pulse Rate: 77  (10/08 0318) Intake/Output from previous day:   Intake/Output from this shift:    Labs:  Basename 10/29/11 2005  WBC 12.0*  HGB 14.8  PLT 145*  LABCREA --  CREATININE 0.85   Estimated Creatinine Clearance: 154.8 ml/min (by C-G formula based on Cr of 0.85). No results found for this basename: VANCOTROUGH:2,VANCOPEAK:2,VANCORANDOM:2,GENTTROUGH:2,GENTPEAK:2,GENTRANDOM:2,TOBRATROUGH:2,TOBRAPEAK:2,TOBRARND:2,AMIKACINPEAK:2,AMIKACINTROU:2,AMIKACIN:2, in the last 72 hours   Microbiology: No results found for this or any previous visit (from the past 720 hour(s)).  Medical History: History reviewed. No pertinent past medical history.  Medications:  Prescriptions prior to admission  Medication Sig Dispense Refill  . albuterol (PROVENTIL HFA;VENTOLIN HFA) 108 (90 BASE) MCG/ACT inhaler Inhale 2 puffs into the lungs every 6 (six) hours as needed. For shortness of breath      . allopurinol (ZYLOPRIM) 300 MG tablet Take 300 mg by mouth daily.      Marland Kitchen ALPRAZolam (XANAX) 1 MG tablet Take 1 mg by mouth at bedtime as needed. For anxiety      . amoxicillin-clavulanate (AUGMENTIN) 875-125 MG per tablet Take 1 tablet by mouth 2 (two) times daily.      . bumetanide (BUMEX) 0.5 MG tablet Take 0.5 mg by mouth daily.      . cyclobenzaprine (FLEXERIL) 10 MG tablet Take 10 mg by mouth 3 (three) times daily as needed. For muscle spasms      . DULoxetine (CYMBALTA) 60 MG capsule Take 60 mg by mouth 2 (two) times daily.       . mirtazapine (REMERON) 30 MG tablet Take 30 mg by mouth at bedtime.      Marland Kitchen oxyCODONE (OXYCONTIN) 80 MG 12 hr tablet Take 160 mg by mouth every 12 (twelve) hours.      Marland Kitchen oxycodone (ROXICODONE) 30 MG immediate release tablet Take 30 mg by mouth every 4 (four) hours as needed. For breakthrough pain      . predniSONE (DELTASONE) 5 MG tablet Take 15 mg by mouth daily.       Assessment: 46 yo male with PNA for empiric antibiotics  Goal of Therapy:  Vancomycin trough level 15-20 mcg/ml  Plan:  Zosyn 3.375 g IV q8h Vancomycin 2 g IV now, then 1250 mg IV q8h  Josselyne Onofrio, Gary Fleet 10/30/2011,3:50 AM

## 2011-10-30 NOTE — ED Notes (Signed)
Unable to locate pt at this time - pt not in room or restroom.

## 2011-10-30 NOTE — ED Notes (Signed)
Patient noted to have intermittent low oxygen readings of 87 percent on room air,  Oxygen placed per orders

## 2011-10-30 NOTE — Progress Notes (Signed)
[facility] PROGRESS NOTE  Assessment/Plan: PNA (pneumonia) (10/30/2011) -patient has never been hospitalize. -Will start on rocephin and azithro. -Afebrile, leukocytosis resolved. -increase steroids.  Code Status: full  Disposition Plan: home  Antibiotics:  Vanc and zosyn 10/29/2011  Rocephin and azithro 10/30/2011  HPI/Subjective: SOB and cough improved.  Objective: Filed Vitals:   10/30/11 0040 10/30/11 0318 10/30/11 0351 10/30/11 0352  BP: 121/73 128/75 132/64   Pulse: 68 77 84   Temp: 98 F (36.7 C) 98.1 F (36.7 C) 98 F (36.7 C)   TempSrc: Oral Oral Oral   Resp: 20 18 14    Height:  6\' 3"  (1.905 m)  6\' 3"  (1.905 m)  Weight:  122.38 kg (269 lb 12.8 oz)  122.38 kg (269 lb 12.8 oz)  SpO2: 93% 95% 98%     Intake/Output Summary (Last 24 hours) at 10/30/11 0840 Last data filed at 10/30/11 0700  Gross per 24 hour  Intake      0 ml  Output   1950 ml  Net  -1950 ml   Filed Weights   10/30/11 0318 10/30/11 0352  Weight: 122.38 kg (269 lb 12.8 oz) 122.38 kg (269 lb 12.8 oz)    Exam:  General: Alert, awake, oriented x3, in no acute distress.  HEENT: No bruits, no goiter.  Heart: Regular rate and rhythm, without murmurs, rubs, gallops.  Lungs: Good air movement, wheezing bilaterally Abdomen: Soft, nontender, nondistended, positive bowel sounds.  Neuro: Grossly intact, nonfocal.   Data Reviewed: Basic Metabolic Panel:  Lab 10/30/11 1610 10/29/11 2005  NA 136 138  K 3.7 4.2  CL 98 99  CO2 27 29  GLUCOSE 197* 260*  BUN 7 7  CREATININE 0.80 0.85  CALCIUM 8.7 9.2  MG -- --  PHOS -- --   Liver Function Tests:  Lab 10/29/11 2005  AST 49*  ALT 66*  ALKPHOS 133*  BILITOT 0.6  PROT 7.4  ALBUMIN 3.9   No results found for this basename: LIPASE:5,AMYLASE:5 in the last 168 hours No results found for this basename: AMMONIA:5 in the last 168 hours CBC:  Lab 10/30/11 0445 10/29/11 2005  WBC 10.4 12.0*  NEUTROABS 6.5 9.7*  HGB 13.9 14.8  HCT  40.5 43.1  MCV 90.8 90.4  PLT 124* 145*   Cardiac Enzymes: No results found for this basename: CKTOTAL:5,CKMB:5,CKMBINDEX:5,TROPONINI:5 in the last 168 hours BNP (last 3 results) No results found for this basename: PROBNP:3 in the last 8760 hours CBG:  Lab 10/30/11 0503  GLUCAP 197*    No results found for this or any previous visit (from the past 240 hour(s)).   Studies: Ct Angio Chest Pe W/cm &/or Wo Cm  10/30/2011  *RADIOLOGY REPORT*  Clinical Data: Chest pain shortness of breath, recent diagnosis of pneumonia  CT ANGIOGRAPHY CHEST  Technique:  Multidetector CT imaging of the chest using the standard protocol during bolus administration of intravenous contrast. Multiplanar reconstructed images including MIPs were obtained and reviewed to evaluate the vascular anatomy.  Contrast: OMNIPAQUE IOHEXOL 350 MG/ML SOLN  Comparison: Chest radiograph - 10/29/2011; 10/14/2011  Vascular Findings:  There is adequate opacification of the pulmonary vasculature in the main pulmonary artery measuring 250 HU.  There are no discrete filling defects within the pulmonary arterial tree to the level of the bilateral subsegmental pulmonary arteries.  Evaluation of the distal subsegmental pulmonary arteries is degraded secondary to suboptimal vessel opacification.  Normal caliber of the main pulmonary artery.  Normal heart size.  No pericardial effusion.  Normal caliber of the thoracic aorta.  Normal configuration of the aortic arch. Visualized portions of the cervical vasculature are patent.  No thoracic aortic dissection.  No periaortic stranding.  Nonvascular findings:  Ill-defined heterogeneous air space opacities within the left lower lung (images 57, 67 and 68, series 6) correlate with the findings on recent chest radiograph and are worrisome for infection. Bibasilar dependent ground-glass opacities compatible with atelectasis.  No pleural effusion or pneumothorax.  The central airways appear patent.   Scattered shoddy mediastinal lymph nodes are not enlarged by CT criteria with index pretracheal node measuring 8 mm in short axis diameter (image 34, series 5), not enlarged by CT criteria. Borderline enlarged right hilar lymph node measures approximately 1.1 cm in short axis diameter (image 41).  No definite left hilar adenopathy.  No axillary adenopathy.  Incidental imaging of the upper abdomen demonstrates diffuse decreased attenuation of the hepatic parenchyma suggestive of hepatic steatosis.  Possible sub centimeter nodule within the left lobe of the thyroid lobe ( image 5).  No acute or aggressive osseous abnormalities.  IMPRESSION:  1.  Negative for pulmonary embolism to the level of the bilateral subsegmental pulmonary arteries. 2.  Ill-defined left lower lobe heterogeneous air space opacities correlate with the findings on recent chest radiograph and are worrisome for infection.   A follow-up chest radiograph in 4 to 6 weeks after treatment is recommended to ensure resolution.  3.  Shoddy mediastinal and right hilar lymph nodes, presumably reactive in etiology. 4.  Hepatic steatosis suspected.   Original Report Authenticated By: Waynard Reeds, M.D.     Scheduled Meds:   . allopurinol  300 mg Oral Daily  . bumetanide  0.5 mg Oral Daily  . DULoxetine  60 mg Oral BID  . mirtazapine  30 mg Oral QHS  . oxyCODONE  160 mg Oral Q12H  . piperacillin-tazobactam (ZOSYN)  IV  3.375 g Intravenous Q8H  . predniSONE  15 mg Oral Q breakfast  . sodium chloride  3 mL Intravenous Q12H  . vancomycin  1,250 mg Intravenous Q8H  . vancomycin  2,000 mg Intravenous Once  . DISCONTD: sodium chloride   Intravenous STAT  . DISCONTD: piperacillin-tazobactam (ZOSYN)  IV  3.375 g Intravenous Once  . DISCONTD: vancomycin  1,000 mg Intravenous Once   Continuous Infusions:    Marinda Elk  [facility] Pager [phone]. If 8PM-8AM, please contact night-coverage at [email or web address], password  [facility] 10/30/2011, 8:40 AM  LOS: 1 day

## 2011-10-30 NOTE — ED Notes (Signed)
Pt returned to room - states he had to go out to his truck to take his night time medications - smells of cigarette smoke.

## 2011-10-31 DIAGNOSIS — E119 Type 2 diabetes mellitus without complications: Secondary | ICD-10-CM | POA: Diagnosis present

## 2011-10-31 DIAGNOSIS — R739 Hyperglycemia, unspecified: Secondary | ICD-10-CM | POA: Diagnosis present

## 2011-10-31 DIAGNOSIS — R7309 Other abnormal glucose: Secondary | ICD-10-CM

## 2011-10-31 MED ORDER — PREDNISONE 5 MG PO TABS: 15.0000 mg | ORAL_TABLET | Freq: Every day | ORAL | Status: DC

## 2011-10-31 MED ORDER — PREDNISONE (PAK) 10 MG PO TABS: 40.0000 mg | ORAL_TABLET | Freq: Every day | ORAL | Status: DC

## 2011-10-31 MED ORDER — LEVOFLOXACIN 500 MG PO TABS: 500.0000 mg | ORAL_TABLET | Freq: Every day | ORAL | Status: DC

## 2011-10-31 NOTE — Progress Notes (Signed)
Pt given discharge instructions, medication lists, follow up appointments, and when to call the doctor.  Pt verbalizes understanding. Brandon Miller   

## 2011-10-31 NOTE — Care Management Note (Signed)
    Page 1 of 1   10/31/2011     11:43:49 AM   CARE MANAGEMENT NOTE 10/31/2011  Patient:  DONNELLE, MINCY   Account Number:  [id number]  Date Initiated:  10/31/2011  Documentation initiated by:  Maylen Waltermire  Subjective/Objective Assessment:   PT ADM 10/7 WITH PNEUMONIA; PTA, PT INDEPENDENT, LIVES WITH FRIENDS.     Action/Plan:   WILL FOLLOW FOR HOME NEEDS AS PT PROGRESSES.  NO HOME NEEDS ANTICIPATED.   Anticipated DC Date:  10/31/2011   Anticipated DC Plan:  HOME/SELF CARE      DC Planning Services  CM consult      Choice offered to / List presented to:             Status of service:  Completed, signed off Medicare Important Message given?   (If response is "NO", the following Medicare IM given date fields will be blank) Date Medicare IM given:   Date Additional Medicare IM given:    Discharge Disposition:  HOME/SELF CARE  Per UR Regulation:  Reviewed for med. necessity/level of care/duration of stay  If discussed at Long Length of Stay Meetings, dates discussed:    Comments:

## 2011-11-02 NOTE — Discharge Summary (Signed)
Physician Discharge Summary  Patient ID: Yaacov Koziol MRN: [id number] DOB/AGE: May 21, 1965 46 y.o.  Admit date: 10/29/2011 Discharge date: 11/02/2011  Primary Care Physician:  Lenora Boys, MD  Disposition and Follow-up:  PCP in 1 week FU CXR in 4-6 weeks to ensure resolution  Discharge Diagnoses:   Community acquired pneumonia COPD exacerbation Active Problems:  Diabetes mellitus, type 2  Hyperglycemia exacerbated by steroids  Chronic back pain  Tobacco use  Arthritis ? RA followed by Rheumatology    Medication List     As of 11/02/2011 11:21 AM    STOP taking these medications         amoxicillin-clavulanate 875-125 MG per tablet   Commonly known as: AUGMENTIN      TAKE these medications         albuterol 108 (90 BASE) MCG/ACT inhaler   Commonly known as: PROVENTIL HFA;VENTOLIN HFA   Inhale 2 puffs into the lungs every 6 (six) hours as needed. For shortness of breath      allopurinol 300 MG tablet   Commonly known as: ZYLOPRIM   Take 300 mg by mouth daily.      ALPRAZolam 1 MG tablet   Commonly known as: XANAX   Take 1 mg by mouth at bedtime as needed. For anxiety      bumetanide 0.5 MG tablet   Commonly known as: BUMEX   Take 0.5 mg by mouth daily.      cyclobenzaprine 10 MG tablet   Commonly known as: FLEXERIL   Take 10 mg by mouth 3 (three) times daily as needed. For muscle spasms      DULoxetine 60 MG capsule   Commonly known as: CYMBALTA   Take 60 mg by mouth 2 (two) times daily.      levofloxacin 500 MG tablet   Commonly known as: LEVAQUIN   Take 1 tablet (500 mg total) by mouth daily. For 7 days      mirtazapine 30 MG tablet   Commonly known as: REMERON   Take 30 mg by mouth at bedtime.      oxycodone 30 MG immediate release tablet   Commonly known as: ROXICODONE   Take 30 mg by mouth every 4 (four) hours as needed. For breakthrough pain      oxyCODONE 80 MG 12 hr tablet   Commonly known as: OXYCONTIN   Take 160 mg by mouth every 12  (twelve) hours.      predniSONE 5 MG tablet   Commonly known as: DELTASONE   Take 3 tablets (15 mg total) by mouth daily. Resume 15mg  daily after prednisone taper      predniSONE 10 MG tablet   Commonly known as: STERAPRED UNI-PAK   Take 4 tablets (40 mg total) by mouth daily. Take 40mg  for 3 days then 20mg  for 3 days them back to basal dose of 15mg  daily         Disposition and Follow-up:  PCP in 1 week  Significant Diagnostic Studies:  CT ANGIOGRAPHY CHEST IMPRESSION:  1. Negative for pulmonary embolism to the level of the bilateral subsegmental pulmonary arteries. 2. Ill-defined left lower lobe heterogeneous air space opacities correlate with the findings on recent chest radiograph and are worrisome for infection. A follow-up chest radiograph in 4 to 6 weeks after treatment is recommended to ensure resolution.  3. Shoddy mediastinal and right hilar lymph nodes, presumably reactive in etiology. 4. Hepatic steatosis suspected.     Brief H and P: 46 yo male  tx for pna with 10 days of avelox finished several days ago comes in with fever 103 yesterday and general malaise since yesterday. Ct chest today shows pna. No other med issues except arthritis. Otherwise doing well. No n/v/d. No abd pain. Some mild pleuritic cp associated with cough.   Hospital Course:  1. Community Acquired pneumonia with COPD exacerbation and ongoing tobacco use, clinically improved with IV antibiotics /Steroids, nebs. defervesced and leukocytosis resolved Discharged home on oral levofloxacin with a prednisone taper till his basal dose of 15mg  as prescribed by his rheumatologist, advised to quit smoking  2. Hyperglycemia secondary to DM vs steroids His HbAic was 7.0 above the threshold for diagnosis of DM, but patient stated that he had been on steroids for few months now for suspected RA per his rheumatologist. This will need to be followed closely, and rechecked once off steroids, advised to be on  a carbohydrate modified diet/life style modification  3. Chronic pain: continued on home dose of narcotics  Time spent on Discharge:  Signed: Dalanie Kisner [facility]  11/02/2011, 11:22 AM

## 2011-11-05 LAB — CULTURE, BLOOD (ROUTINE X 2): Culture: NO GROWTH

## 2012-10-02 ENCOUNTER — Ambulatory Visit: Payer: Self-pay | Admitting: Family Medicine

## 2012-10-02 VITALS — BP 112/74 | HR 76 | Temp 98.2°F | Resp 18 | Ht 74.0 in | Wt 267.0 lb

## 2012-10-02 DIAGNOSIS — E119 Type 2 diabetes mellitus without complications: Secondary | ICD-10-CM

## 2012-10-02 DIAGNOSIS — R739 Hyperglycemia, unspecified: Secondary | ICD-10-CM

## 2012-10-02 DIAGNOSIS — Z0289 Encounter for other administrative examinations: Secondary | ICD-10-CM

## 2012-10-02 NOTE — Progress Notes (Signed)
UA- spgr- 1.015, glucose-neg, protein-neg, blood-neg

## 2012-10-02 NOTE — Progress Notes (Addendum)
Commercial Driver Medical Examination   Brandon Miller is a 47 y.o. male who presents today for a commercial driver fitness determination physical exam. The patient reports no problems.  Note from PCP Dr. Foy Guadalajara reviewed and scanned inwho states that pt has been on oxycodone stable dose for a prolonged period and that he has become completely tolerant to his sedative effects and that he would be less safe to drive OFF of the medication. His psychiatric medications are also prescribed by his PCP and he state she only takes the alprazolam, mirtazapine, and flexeril in the evening when he is not working to help him relax and sleep. He has been on oxycodone for over 4 years and alprazolam for over 6 years.  He reports that his cbgs have been about 100 in the a.m. Fasting.  Denies any hypoglycemic episodes, does not know what his a1c is but reports his DM, which was diagnosed < 1 yr prev, has been well controlled on glucophage and glucotrol.  The following portions of the patient's history were reviewed and updated as appropriate: allergies, current medications, past family history, past medical history, past social history, past surgical history and problem list. Review of Systems Pertinent items are noted in HPI.   Objective:    Vision:  Uncorrected Corrected Horizontal Field of Vision  Right Eye 20/30 NA 70 degrees  Left Eye  20/30 NA 70 degrees  Both Eyes  20/30 NA    Applicant can recognize and distinguish among traffic control signals and devices showing standard red, green, and amber colors.     Monocular Vision?: No   Hearing:   500 Hz 1000 Hz 2000 Hz 4000 Hz  Right Ear  NA NA NA NA  Left Ear  NA NA NA NA   Can hear forced whisper at 9 feet bilaterally    BP 112/74  Pulse 76  Temp(Src) 98.2 F (36.8 C) (Oral)  Resp 18  Ht 6\' 2"  (1.88 m)  Wt 267 lb (121.11 kg)  BMI 34.27 kg/m2  SpO2 93%  General Appearance:    Alert, cooperative, no distress, appears stated age  Head:     Normocephalic, without obvious abnormality, atraumatic  Eyes:    PERRL, conjunctiva/corneas clear, EOM's intact, fundi    benign, both eyes       Ears:    Normal TM's and external ear canals, both ears  Nose:   Nares normal, septum midline, mucosa normal, no drainage    or sinus tenderness  Throat:   Lips, mucosa, and tongue normal; teeth and gums normal  Neck:   Supple, symmetrical, trachea midline, no adenopathy;       thyroid:  No enlargement/tenderness/nodules; no carotid   bruit or JVD  Back:     Symmetric, no curvature, ROM normal, no CVA tenderness  Lungs:     Clear to auscultation bilaterally, respirations unlabored  Chest wall:    No tenderness or deformity  Heart:    Regular rate and rhythm, S1 and S2 normal, no murmur, rub   or gallop  Abdomen:     Soft, non-tender, bowel sounds active all four quadrants,    no masses, no organomegaly - small umbilical hernia and diastasis recti, abdominal obesity  Genitalia:    Normal male without lesion, discharge or tenderness     Extremities:   Extremities normal, atraumatic, no cyanosis or edema  Pulses:   1+ DP pulse and symmetric all extremities  Skin:   Skin color, texture, turgor normal, no  rashes or lesions  Lymph nodes:   Cervical, supraclavicular, and axillary nodes normal  Neurologic:   CNII-XII intact. Normal strength, sensation and reflexes      Throughout, decreased lumbar flexion and extension    Labs: No results found for this basename: SPECGRAV, PROTEINUR, BILIRUBINUR, GLUCOSEU  See nurse note, SG 1.015, neg prot, neg blood, neg sugar    Assessment:    Healthy male exam.  Meets standards, but periodic monitoring required due to DM, sedative medications, tobacco use.  Driver qualified only for 1 year.    Plan:    Medical examiners certificate completed and printed. Return as needed.

## 2012-10-07 ENCOUNTER — Encounter: Payer: Self-pay | Admitting: Family Medicine

## 2013-05-18 ENCOUNTER — Other Ambulatory Visit: Payer: Self-pay | Admitting: Physician Assistant

## 2013-06-08 ENCOUNTER — Encounter (HOSPITAL_BASED_OUTPATIENT_CLINIC_OR_DEPARTMENT_OTHER): Payer: Self-pay | Admitting: *Deleted

## 2013-06-08 NOTE — Progress Notes (Signed)
06/08/13 1537  OBSTRUCTIVE SLEEP APNEA  Have you ever been diagnosed with sleep apnea through a sleep study? No  Do you snore loudly (loud enough to be heard through closed doors)?  1  Do you often feel tired, fatigued, or sleepy during the daytime? 0  Has anyone observed you stop breathing during your sleep? 1  Do you have, or are you being treated for high blood pressure? 1  BMI more than 35 kg/m2? 0  Age over 47 years old? 0  Neck circumference greater than 40 cm/16 inches? 1  Gender: 1  Obstructive Sleep Apnea Score 5  Score 4 or greater  Results sent to PCP

## 2013-06-08 NOTE — Progress Notes (Signed)
Pt to come in for ekg-bmet-probably has sleep apnea-told to bring meds and overnight bag

## 2013-06-10 ENCOUNTER — Encounter (HOSPITAL_BASED_OUTPATIENT_CLINIC_OR_DEPARTMENT_OTHER)
Admission: RE | Admit: 2013-06-10 | Discharge: 2013-06-10 | Disposition: A | Discharge status: Home / Self Care | Payer: BC Managed Care – PPO | Source: Ambulatory Visit | Attending: Orthopedic Surgery | Admitting: Orthopedic Surgery

## 2013-06-10 LAB — BASIC METABOLIC PANEL
BUN: 13 mg/dL (ref 6–23)
CALCIUM: 8.9 mg/dL (ref 8.4–10.5)
CO2: 24 meq/L (ref 19–32)
CREATININE: 0.89 mg/dL (ref 0.50–1.35)
Chloride: 104 mEq/L (ref 96–112)
GFR calc Af Amer: >90 mL/min (ref >90)
Glucose, Bld: 158 mg/dL — ABNORMAL HIGH (ref 70–99)
Potassium: 4 mEq/L (ref 3.7–5.3)
Sodium: 139 mEq/L (ref 137–147)

## 2013-06-10 NOTE — H&P (Signed)
  [facility] [address] [place], [place] [address] [phone] A Division of [facility]  Ninetta Lights, M.D.   Robert A. Noemi Chapel, M.D.   Faythe Casa, M.D.   Johnny Bridge, M.D.   Almedia Balls, M.D Ernesta Amble. Brandon Brandon Miller Brandon Miller, M.D.  Joseph Pierini, M.D.  Lanier Prude, M.D.    Verner Chol, M.D. Mary L. Fenton Malling, PA-C  Kirstin A. Shepperson, PA-C  Josh Maquon, PA-C [place], [place]   RE:  Brandon Brandon Miller, Brandon Miller PROGRESS NOTE: 04-28-13 Brandon Miller comes in for his right knee. Recurrent medial mechanical symptoms. Recently seen and evaluated by Dr. Alfonso Ramus. Notes from that visit 3/23 reviewed. X-rays also reviewed which show reasonable maintenance of joint space on 4 view standing films. Continues under care for RA with Humira and Methotrexate. This is controlling symptoms in multiple joints. Right knee continues to be markedly symptomatic. Given failure with conservative treatment underwent an MRI. This shows progressive medial meniscus degeneration with extrusion and a new tear. Mild changes otherwise. Not a lot of inflammation except for suprapatellar pouch. I reviewed the scan and report and discussed it with him. Remaining history general exam is outlined included in the chart.  EXAMINATION: In general inflammatory arthritis is under fairly good control. His right knee has 1 to 2+ effusion, point tenderness medial joint line positive medial McMurray's. Stable ligaments.  DISPOSITION: Rheumatoid arthritis under good control medically. Progressive medial meniscus tear right knee. Sufficient symptoms to warrant treatment. More than 25 minutes spent covering these issues and plans with him. Proceed with arthroscopic assessment and debridement. Discussed this with him he understands. Discussed risks benefits and possible complications in detail. Paperwork complete all questions answered.  Ninetta Lights,  M.D.  Electronically verified by Ninetta Lights, M.D. DFM:kah D 04-30-13 T 04-30-13

## 2013-06-11 ENCOUNTER — Encounter (HOSPITAL_BASED_OUTPATIENT_CLINIC_OR_DEPARTMENT_OTHER): Payer: Self-pay | Admitting: *Deleted

## 2013-06-11 ENCOUNTER — Ambulatory Visit (HOSPITAL_BASED_OUTPATIENT_CLINIC_OR_DEPARTMENT_OTHER): Payer: BC Managed Care – PPO | Admitting: Certified Registered"

## 2013-06-11 ENCOUNTER — Encounter (HOSPITAL_BASED_OUTPATIENT_CLINIC_OR_DEPARTMENT_OTHER)
Admission: RE | Disposition: A | Discharge status: Home / Self Care | Payer: Self-pay | Source: Ambulatory Visit | Attending: Orthopedic Surgery

## 2013-06-11 ENCOUNTER — Ambulatory Visit (HOSPITAL_BASED_OUTPATIENT_CLINIC_OR_DEPARTMENT_OTHER)
Admission: RE | Admit: 2013-06-11 | Discharge: 2013-06-11 | Disposition: A | Discharge status: Home / Self Care | Payer: BC Managed Care – PPO | Source: Ambulatory Visit | Attending: Orthopedic Surgery | Admitting: Orthopedic Surgery

## 2013-06-11 ENCOUNTER — Encounter (HOSPITAL_BASED_OUTPATIENT_CLINIC_OR_DEPARTMENT_OTHER): Payer: BC Managed Care – PPO | Admitting: Certified Registered"

## 2013-06-11 DIAGNOSIS — F411 Generalized anxiety disorder: Secondary | ICD-10-CM | POA: Insufficient documentation

## 2013-06-11 DIAGNOSIS — F329 Major depressive disorder, single episode, unspecified: Secondary | ICD-10-CM | POA: Insufficient documentation

## 2013-06-11 DIAGNOSIS — M2240 Chondromalacia patellae, unspecified knee: Secondary | ICD-10-CM | POA: Insufficient documentation

## 2013-06-11 DIAGNOSIS — M1710 Unilateral primary osteoarthritis, unspecified knee: Secondary | ICD-10-CM | POA: Insufficient documentation

## 2013-06-11 DIAGNOSIS — E119 Type 2 diabetes mellitus without complications: Secondary | ICD-10-CM | POA: Insufficient documentation

## 2013-06-11 DIAGNOSIS — F17200 Nicotine dependence, unspecified, uncomplicated: Secondary | ICD-10-CM | POA: Insufficient documentation

## 2013-06-11 DIAGNOSIS — M069 Rheumatoid arthritis, unspecified: Secondary | ICD-10-CM | POA: Insufficient documentation

## 2013-06-11 DIAGNOSIS — M659 Unspecified synovitis and tenosynovitis, unspecified site: Secondary | ICD-10-CM | POA: Insufficient documentation

## 2013-06-11 DIAGNOSIS — F3289 Other specified depressive episodes: Secondary | ICD-10-CM | POA: Insufficient documentation

## 2013-06-11 DIAGNOSIS — X58XXXA Exposure to other specified factors, initial encounter: Secondary | ICD-10-CM | POA: Insufficient documentation

## 2013-06-11 DIAGNOSIS — IMO0002 Reserved for concepts with insufficient information to code with codable children: Secondary | ICD-10-CM | POA: Insufficient documentation

## 2013-06-11 HISTORY — DX: Adverse effect of unspecified anesthetic, initial encounter: T41.45XA

## 2013-06-11 HISTORY — DX: Other complications of anesthesia, initial encounter: T88.59XA

## 2013-06-11 HISTORY — DX: Snoring: R06.83

## 2013-06-11 HISTORY — PX: KNEE ARTHROSCOPY WITH MEDIAL MENISECTOMY: SHX5651

## 2013-06-11 LAB — GLUCOSE, CAPILLARY
GLUCOSE-CAPILLARY: 185 mg/dL — AB (ref 70–99)
GLUCOSE-CAPILLARY: 120 mg/dL — AB (ref 70–99)

## 2013-06-11 LAB — POCT HEMOGLOBIN-HEMACUE: Hemoglobin: 14.6 g/dL (ref 13.0–17.0)

## 2013-06-11 SURGERY — ARTHROSCOPY, KNEE, WITH MEDIAL MENISCECTOMY
Anesthesia: General | Site: Knee | Laterality: Right

## 2013-06-11 MED ORDER — OXYCODONE HCL 5 MG PO TABS
5.0000 mg | ORAL_TABLET | Freq: Once | ORAL | Status: AC | PRN
  Administered 2013-06-11: 5 mg via ORAL

## 2013-06-11 MED ORDER — FENTANYL CITRATE 0.05 MG/ML IJ SOLN
INTRAMUSCULAR | Status: DC | PRN
  Administered 2013-06-11: 100 ug via INTRAVENOUS
  Administered 2013-06-11 (×3): 25 ug via INTRAVENOUS

## 2013-06-11 MED ORDER — METHYLPREDNISOLONE ACETATE 80 MG/ML IJ SUSP
INTRAMUSCULAR | Status: DC | PRN
  Administered 2013-06-11: 80 mg

## 2013-06-11 MED ORDER — MIDAZOLAM HCL 2 MG/2ML IJ SOLN
INTRAMUSCULAR | Status: AC
Start: 2013-06-11 — End: 2013-06-11
  Filled 2013-06-11: qty 2

## 2013-06-11 MED ORDER — CEFAZOLIN SODIUM 1-5 GM-% IV SOLN
INTRAVENOUS | Status: AC
  Filled 2013-06-11: qty 50

## 2013-06-11 MED ORDER — OXYCODONE HCL 5 MG PO TABS
ORAL_TABLET | ORAL | Status: AC
  Filled 2013-06-11: qty 1

## 2013-06-11 MED ORDER — SODIUM CHLORIDE 0.9 % IR SOLN
Status: DC | PRN
  Administered 2013-06-11: 6000 mL

## 2013-06-11 MED ORDER — OXYCODONE HCL 5 MG PO TABS: 5.0000 mg | ORAL_TABLET | ORAL | Status: DC | PRN

## 2013-06-11 MED ORDER — FENTANYL CITRATE 0.05 MG/ML IJ SOLN: 50.0000 ug | INTRAMUSCULAR | Status: DC | PRN

## 2013-06-11 MED ORDER — ACETAMINOPHEN 500 MG PO TABS
ORAL_TABLET | ORAL | Status: AC
  Filled 2013-06-11: qty 2

## 2013-06-11 MED ORDER — MIDAZOLAM HCL 2 MG/2ML IJ SOLN: 1.0000 mg | INTRAMUSCULAR | Status: DC | PRN

## 2013-06-11 MED ORDER — HYDROMORPHONE HCL PF 1 MG/ML IJ SOLN
0.2500 mg | INTRAMUSCULAR | Status: DC | PRN
  Administered 2013-06-11 (×2): 0.5 mg via INTRAVENOUS

## 2013-06-11 MED ORDER — KETOROLAC TROMETHAMINE 30 MG/ML IJ SOLN
INTRAMUSCULAR | Status: DC | PRN
  Administered 2013-06-11: 30 mg via INTRAVENOUS

## 2013-06-11 MED ORDER — BUPIVACAINE HCL (PF) 0.25 % IJ SOLN
INTRAMUSCULAR | Status: AC
  Filled 2013-06-11: qty 30

## 2013-06-11 MED ORDER — DEXTROSE 5 % IV SOLN
3.0000 g | INTRAVENOUS | Status: AC
  Administered 2013-06-11: 3 g via INTRAVENOUS

## 2013-06-11 MED ORDER — PROPOFOL 10 MG/ML IV BOLUS
INTRAVENOUS | Status: DC | PRN
  Administered 2013-06-11: 200 mg via INTRAVENOUS

## 2013-06-11 MED ORDER — CHLORHEXIDINE GLUCONATE 4 % EX LIQD: 60.0000 mL | Freq: Once | CUTANEOUS | Status: DC

## 2013-06-11 MED ORDER — LACTATED RINGERS IV SOLN
INTRAVENOUS | Status: DC
  Administered 2013-06-11 (×2): via INTRAVENOUS

## 2013-06-11 MED ORDER — ACETAMINOPHEN 500 MG PO TABS: 1000.0000 mg | ORAL_TABLET | Freq: Once | ORAL | Status: DC

## 2013-06-11 MED ORDER — ONDANSETRON HCL 4 MG/2ML IJ SOLN
INTRAMUSCULAR | Status: DC | PRN
  Administered 2013-06-11: 4 mg via INTRAVENOUS

## 2013-06-11 MED ORDER — METOCLOPRAMIDE HCL 5 MG/ML IJ SOLN
INTRAMUSCULAR | Status: DC | PRN
Start: 2013-06-11 — End: 2013-06-11
  Administered 2013-06-11: 10 mg via INTRAVENOUS

## 2013-06-11 MED ORDER — HYDROMORPHONE HCL PF 1 MG/ML IJ SOLN
INTRAMUSCULAR | Status: AC
  Filled 2013-06-11: qty 1

## 2013-06-11 MED ORDER — CEFAZOLIN SODIUM-DEXTROSE 2-3 GM-% IV SOLR
INTRAVENOUS | Status: AC
  Filled 2013-06-11: qty 50

## 2013-06-11 MED ORDER — OXYCODONE HCL 5 MG/5ML PO SOLN: 5.0000 mg | Freq: Once | ORAL | Status: AC | PRN

## 2013-06-11 MED ORDER — LIDOCAINE HCL (CARDIAC) 20 MG/ML IV SOLN
INTRAVENOUS | Status: DC | PRN
  Administered 2013-06-11: 100 mg via INTRAVENOUS

## 2013-06-11 MED ORDER — BUPIVACAINE HCL (PF) 0.5 % IJ SOLN
INTRAMUSCULAR | Status: AC
  Filled 2013-06-11: qty 30

## 2013-06-11 MED ORDER — MIDAZOLAM HCL 5 MG/5ML IJ SOLN
INTRAMUSCULAR | Status: DC | PRN
  Administered 2013-06-11: 2 mg via INTRAVENOUS

## 2013-06-11 MED ORDER — FENTANYL CITRATE 0.05 MG/ML IJ SOLN
INTRAMUSCULAR | Status: AC
  Filled 2013-06-11: qty 4

## 2013-06-11 MED ORDER — METHYLPREDNISOLONE ACETATE 80 MG/ML IJ SUSP
INTRAMUSCULAR | Status: AC
  Filled 2013-06-11: qty 1

## 2013-06-11 MED ORDER — LACTATED RINGERS IV SOLN
INTRAVENOUS | Status: DC
  Administered 2013-06-11: 07:00:00 via INTRAVENOUS

## 2013-06-11 MED ORDER — BUPIVACAINE HCL (PF) 0.5 % IJ SOLN
INTRAMUSCULAR | Status: DC | PRN
  Administered 2013-06-11: 20 mL

## 2013-06-11 SURGICAL SUPPLY — 38 items
BANDAGE ELASTIC 6 VELCRO ST LF (GAUZE/BANDAGES/DRESSINGS) ×2 IMPLANT
BLADE CUDA 5.5 (BLADE) IMPLANT
BLADE CUDA GRT WHITE 3.5 (BLADE) IMPLANT
BLADE CUTTER GATOR 3.5 (BLADE) ×2 IMPLANT
BLADE CUTTER MENIS 5.5 (BLADE) IMPLANT
BLADE GREAT WHITE 4.2 (BLADE) ×2 IMPLANT
BUR OVAL 4.0 (BURR) IMPLANT
CANISTER SUCT 3000ML (MISCELLANEOUS) IMPLANT
CUTTER MENISCUS  4.2MM (BLADE)
CUTTER MENISCUS 4.2MM (BLADE) IMPLANT
DRAPE ARTHROSCOPY W/POUCH 90 (DRAPES) ×2 IMPLANT
DURAPREP 26ML APPLICATOR (WOUND CARE) ×2 IMPLANT
ELECT MENISCUS 165MM 90D (ELECTRODE) IMPLANT
ELECT REM PT RETURN 9FT ADLT (ELECTROSURGICAL)
ELECTRODE REM PT RTRN 9FT ADLT (ELECTROSURGICAL) IMPLANT
GAUZE SPONGE 4X4 12PLY STRL (GAUZE/BANDAGES/DRESSINGS) ×4 IMPLANT
GAUZE XEROFORM 1X8 LF (GAUZE/BANDAGES/DRESSINGS) ×2 IMPLANT
GLOVE BIOGEL PI IND STRL 7.0 (GLOVE) ×1 IMPLANT
GLOVE BIOGEL PI INDICATOR 7.0 (GLOVE) ×1
GLOVE ECLIPSE 6.5 STRL STRAW (GLOVE) ×2 IMPLANT
GLOVE ORTHO TXT STRL SZ7.5 (GLOVE) ×2 IMPLANT
GOWN STRL REUS W/ TWL LRG LVL3 (GOWN DISPOSABLE) ×2 IMPLANT
GOWN STRL REUS W/ TWL XL LVL3 (GOWN DISPOSABLE) ×1 IMPLANT
GOWN STRL REUS W/TWL LRG LVL3 (GOWN DISPOSABLE) ×4
GOWN STRL REUS W/TWL XL LVL3 (GOWN DISPOSABLE) ×2
HOLDER KNEE FOAM BLUE (MISCELLANEOUS) ×2 IMPLANT
IV NS IRRIG 3000ML ARTHROMATIC (IV SOLUTION) ×4 IMPLANT
KNEE WRAP E Z 3 GEL PACK (MISCELLANEOUS) ×2 IMPLANT
MANIFOLD NEPTUNE II (INSTRUMENTS) ×2 IMPLANT
PACK ARTHROSCOPY DSU (CUSTOM PROCEDURE TRAY) ×2 IMPLANT
PACK BASIN DAY SURGERY FS (CUSTOM PROCEDURE TRAY) ×2 IMPLANT
PENCIL BUTTON HOLSTER BLD 10FT (ELECTRODE) IMPLANT
SET ARTHROSCOPY TUBING (MISCELLANEOUS) ×2
SET ARTHROSCOPY TUBING LN (MISCELLANEOUS) ×1 IMPLANT
SUT ETHILON 3 0 PS 1 (SUTURE) ×2 IMPLANT
SUT VIC AB 3-0 FS2 27 (SUTURE) IMPLANT
TOWEL OR 17X24 6PK STRL BLUE (TOWEL DISPOSABLE) ×2 IMPLANT
WATER STERILE IRR 1000ML POUR (IV SOLUTION) ×2 IMPLANT

## 2013-06-11 NOTE — Anesthesia Preprocedure Evaluation (Addendum)
Anesthesia Evaluation  Patient identified by MRN, date of birth, ID band Patient awake    Reviewed: Allergy & Precautions, H&P , NPO status , Patient's Chart, lab work & pertinent test results  History of Anesthesia Complications (+) PROLONGED EMERGENCENegative for: history of anesthetic complications  Airway Mallampati: II TM Distance: >3 FB Neck ROM: Full    Dental no notable dental hx. (+) Teeth Intact, Dental Advisory Given   Pulmonary Current Smoker,  breath sounds clear to auscultation  Pulmonary exam normal       Cardiovascular negative cardio ROS  Rhythm:Regular Rate:Normal     Neuro/Psych Anxiety Depression negative neurological ROS     GI/Hepatic negative GI ROS, Neg liver ROS,   Endo/Other  diabetes, Type 2, Oral Hypoglycemic Agents  Renal/GU negative Renal ROS  negative genitourinary   Musculoskeletal   Abdominal   Peds  Hematology negative hematology ROS (+)   Anesthesia Other Findings   Reproductive/Obstetrics negative OB ROS                          Anesthesia Physical Anesthesia Plan  ASA: III  Anesthesia Plan: General   Post-op Pain Management:    Induction: Intravenous  Airway Management Planned: LMA  Additional Equipment:   Intra-op Plan:   Post-operative Plan: Extubation in OR  Informed Consent: I have reviewed the patients History and Physical, chart, labs and discussed the procedure including the risks, benefits and alternatives for the proposed anesthesia with the patient or authorized representative who has indicated his/her understanding and acceptance.   Dental advisory given  Plan Discussed with: CRNA  Anesthesia Plan Comments:         Anesthesia Quick Evaluation

## 2013-06-11 NOTE — Interval H&P Note (Signed)
History and Physical Interval Note:  06/11/2013 7:31 AM  Brandon Miller  has presented today for surgery, with the diagnosis of RIGHT KNEE [place]  The various methods of treatment have been discussed with the patient and family. After consideration of risks, benefits and other options for treatment, the patient has consented to  Procedure(s): RIGHT KNEE ARTHROSCOPY WITH MEDIAL MENISECTOMY AND CHONDROPLASTY (Right) as a surgical intervention .  The patient's history has been reviewed, patient examined, no change in status, stable for surgery.  I have reviewed the patient's chart and labs.  Questions were answered to the patient's satisfaction.     Ninetta Lights

## 2013-06-11 NOTE — Discharge Instructions (Signed)
Arthroscopic Procedure, Knee, Care After Refer to this sheet in the next few weeks. These discharge instructions provide you with general information on caring for yourself after you leave the hospital. Your health care provider may also give you specific instructions. Your treatment has been planned according to the most current medical practices available, but unavoidable complications sometimes occur. If you have any problems or questions after discharge, please call your health care provider. HOME CARE INSTRUCTIONS   Weight bearing as tolerated.  May remove dressing and place band-aids over incisions in 3 days.  May apply ice for up to 20 minutes at a time for pain and swelling.  Follow up appointment in one week.    It is normal to be sore for a couple days after surgery. See your health care provider if this seems to be getting worse rather than better.  Only take over-the-counter or prescription medicines for pain, discomfort, or fever as directed by your health care provider.  Take showers rather than baths, or as directed by your health care provider.  Change bandages (dressings) if necessary or as directed.  You may resume normal diet and activities as directed or allowed.  Avoid lifting and driving until you are directed otherwise.  Make an appointment to see your health care provider for stitches (suture) or staple removal as directed.  You may put ice on the area.  Put the ice in a plastic bag. Place a towel between your skin and the bag.  Leave the ice on for 15 20 minutes, three to four times per day for the first 2 days.  Elevate the knee above the level of your heart to reduce swelling, and avoid dangling the leg.  Do 10-15 ankle pumps (pointing your toes toward you and then away from you) two to three times daily.  If you are given compression stockings to wear after surgery, use them for as long as your surgeon tells you (around 10-14 days).  Avoid smoking and  exposure to second-hand smoke. SEEK MEDICAL CARE IF:   You have increased bleeding from your wounds.  You see redness or swelling or you have increasing pain in your wounds.  You have pus coming from your wound.  You have a fever or persistent symptoms for more than 2 3 days.  You notice a bad smell coming from the wound or dressing.  You have severe pain with any motion of your knee. SEEK IMMEDIATE MEDICAL CARE IF:   You develop a rash.  You have difficulty breathing.  You develop any reaction or side effects to medicines taken.  You develop pain in the calves or back of the knee.  You develop chest pain, shortness of breath, or difficulty breathing.  You develop numbness or tingling in the leg or foot. MAKE SURE YOU:   Understand these instructions.  Will watch your condition.  Will get help right away if you are not doing well or you get worse. Document Released: 07/28/2004 Document Revised: 09/10/2012 Document Reviewed: 06/05/2012 [facility] Patient Information 2014 [place], [place].    Post Anesthesia Home Care Instructions  Activity: Get plenty of rest for the remainder of the day. A responsible adult should stay with you for 24 hours following the procedure.  For the next 24 hours, DO NOT: -Drive a car -Paediatric nurse -Drink alcoholic beverages -Take any medication unless instructed by your physician -Make any legal decisions or sign important papers.  Meals: Start with liquid foods such as gelatin or soup. Progress  to regular foods as tolerated. Avoid greasy, spicy, heavy foods. If nausea and/or vomiting occur, drink only clear liquids until the nausea and/or vomiting subsides. Call your physician if vomiting continues.  Special Instructions/Symptoms: Your throat may feel dry or sore from the anesthesia or the breathing tube placed in your throat during surgery. If this causes discomfort, gargle with warm salt water. The discomfort should disappear within  24 hours.

## 2013-06-11 NOTE — Anesthesia Procedure Notes (Signed)
Procedure Name: LMA Insertion Date/Time: 06/11/2013 7:42 AM Performed by: Lyndee Leo Pre-anesthesia Checklist: Patient identified, Emergency Drugs available, Suction available and Patient being monitored Patient Re-evaluated:Patient Re-evaluated prior to inductionOxygen Delivery Method: Circle System Utilized Preoxygenation: Pre-oxygenation with 100% oxygen Intubation Type: IV induction Ventilation: Mask ventilation without difficulty LMA: LMA inserted LMA Size: 5.0 Number of attempts: 1 Airway Equipment and Method: bite block Placement Confirmation: positive ETCO2 Tube secured with: Tape Dental Injury: Teeth and Oropharynx as per pre-operative assessment

## 2013-06-11 NOTE — Transfer of Care (Signed)
Immediate Anesthesia Transfer of Care Note  Patient: Brandon Miller  Procedure(s) Performed: Procedure(s) with comments: RIGHT KNEE ARTHROSCOPY WITH MEDIAL MENISECTOMY AND CHONDROPLASTY (Right) - Chondroplasty, loose bodies, medial menisectomy, lysis of adhesions.  Patient Location: PACU  Anesthesia Type:General  Level of Consciousness: awake, sedated and patient cooperative  Airway & Oxygen Therapy: Patient Spontanous Breathing and Patient connected to face mask oxygen  Post-op Assessment: Report given to PACU RN and Post -op Vital signs reviewed and stable  Post vital signs: Reviewed and stable  Complications: No apparent anesthesia complications

## 2013-06-11 NOTE — Anesthesia Postprocedure Evaluation (Signed)
  Anesthesia Post-op Note  Patient: Brandon Miller  Procedure(s) Performed: Procedure(s) with comments: RIGHT KNEE ARTHROSCOPY WITH MEDIAL MENISECTOMY AND CHONDROPLASTY (Right) - Chondroplasty, loose bodies, medial menisectomy, lysis of adhesions.  Patient Location: PACU  Anesthesia Type:General  Level of Consciousness: awake and alert   Airway and Oxygen Therapy: Patient Spontanous Breathing  Post-op Pain: mild  Post-op Assessment: Post-op Vital signs reviewed, Patient's Cardiovascular Status Stable and Respiratory Function Stable  Post-op Vital Signs: Reviewed  Filed Vitals:   06/11/13 0930  BP: 104/58  Pulse: 60  Temp:   Resp: 14    Complications: No apparent anesthesia complications

## 2013-06-12 ENCOUNTER — Encounter (HOSPITAL_BASED_OUTPATIENT_CLINIC_OR_DEPARTMENT_OTHER): Payer: Self-pay | Admitting: Orthopedic Surgery

## 2013-06-12 NOTE — Op Note (Signed)
NAMEBALDO, Brandon Miller               ACCOUNT NO.:  [id number]  MEDICAL RECORD NO.:  [id number]  LOCATION:                                 FACILITY:  PHYSICIAN:  Ninetta Lights, M.D. DATE OF BIRTH:  Jun 16, 1965  DATE OF PROCEDURE:  06/11/2013 DATE OF DISCHARGE:  06/11/2013                              OPERATIVE REPORT   PREOPERATIVE DIAGNOSIS:  Right knee progressive degenerative arthritis chondromalacia, recurrent meniscus tear.  POSTOPERATIVE DIAGNOSIS:  Right knee progressive degenerative arthritis chondromalacia, recurrent meniscus tear.  Grade 2 and 3 changes in the peak of patella and diffusely medial compartment numerous chondral loose bodies.  Some reactive synovitis.  Adhesions anteromedial.  PROCEDURE:  Right knee exam under anesthesia, arthroscopy.  Removal of loose bodies.  Chondroplasty patella medial femoral condyle.  Partial medial meniscectomy.  Lysis of adhesions.  SURGEON:  Ninetta Lights, M.D.  ASSIST:  Doran Stabler, PA  ANESTHESIA:  General.  ESTIMATED BLOOD LOSS:  Minimal.  SPECIMENS:  None.  CULTURES:  None.  COMPLICATIONS:  None.  DRESSING:  Soft compressive.  DESCRIPTION OF PROCEDURE:  The patient was brought to operating room, placed on the operating table in supine position.  After adequate anesthesia had been obtained, leg holder applied.  Leg prepped and draped in usual sterile fashion.  Two portals, one each medial and lateral parapatellar.  Arthroscope was introduced.  Knee distended and inspected.  Numerous chondral loose bodies throughout the knee debrided. Reactive synovitis debrided.  Large adhesions anteromedially resected. Recurrent tearing of the medial meniscus throughout the posterior middle 3rd.  Taken out to a stable rim, tapered into remaining meniscus and removing most of the posterior half.  Some grade 2, mild 3 change throughout the compartment debrided.  ACL intact.  Lateral meniscus looked great and  lateral compartment looked great.  Entire knee examined.  No other findings were appreciated.  Instruments were removed.  Knee injected with Depo-Medrol and Marcaine.  Anesthesia reversed.  Brought to the recovery room. Tolerated the surgery well.  No complications.     Ninetta Lights, M.D.     DFM/MEDQ  D:  06/11/2013  T:  06/11/2013  Job:  [id number]

## 2013-06-25 ENCOUNTER — Other Ambulatory Visit: Payer: Self-pay | Admitting: Physical Medicine and Rehabilitation

## 2013-06-25 DIAGNOSIS — M545 Low back pain, unspecified: Secondary | ICD-10-CM

## 2013-06-25 DIAGNOSIS — M5137 Other intervertebral disc degeneration, lumbosacral region: Secondary | ICD-10-CM

## 2013-06-25 DIAGNOSIS — IMO0002 Reserved for concepts with insufficient information to code with codable children: Secondary | ICD-10-CM

## 2013-06-25 DIAGNOSIS — G894 Chronic pain syndrome: Secondary | ICD-10-CM

## 2013-07-05 ENCOUNTER — Ambulatory Visit
Admission: RE | Admit: 2013-07-05 | Discharge: 2013-07-05 | Disposition: A | Discharge status: Home / Self Care | Payer: BC Managed Care – PPO | Source: Ambulatory Visit | Attending: Physical Medicine and Rehabilitation | Admitting: Physical Medicine and Rehabilitation

## 2013-07-05 DIAGNOSIS — G894 Chronic pain syndrome: Secondary | ICD-10-CM

## 2013-07-05 DIAGNOSIS — M545 Low back pain, unspecified: Secondary | ICD-10-CM

## 2013-07-05 DIAGNOSIS — IMO0002 Reserved for concepts with insufficient information to code with codable children: Secondary | ICD-10-CM

## 2013-07-05 DIAGNOSIS — M5137 Other intervertebral disc degeneration, lumbosacral region: Secondary | ICD-10-CM

## 2014-08-10 ENCOUNTER — Other Ambulatory Visit: Payer: Self-pay | Admitting: Physician Assistant

## 2014-08-10 DIAGNOSIS — Z96659 Presence of unspecified artificial knee joint: Secondary | ICD-10-CM

## 2014-08-10 NOTE — H&P (Signed)
TOTAL KNEE ADMISSION H&P  Patient is being admitted for right total knee arthroplasty.  Subjective:  Chief Complaint:right knee pain.  HPI: Brandon Miller, 49 y.o. male, has a history of pain and functional disability in the right knee due to arthritis and has failed non-surgical conservative treatments for greater than 12 weeks to includeNSAID's and/or analgesics, corticosteriod injections, viscosupplementation injections and activity modification.  Onset of symptoms was gradual, starting 8 years ago with gradually worsening course since that time. The patient noted prior procedures on the knee to include  arthroscopy and menisectomy on the right knee(s).  Patient currently rates pain in the right knee(s) at 9 out of 10 with activity. Patient has night pain, worsening of pain with activity and weight bearing and crepitus.  Patient has evidence of subchondral sclerosis and joint space narrowing by imaging studies. There is no active infection.  Patient Active Problem List   Diagnosis Date Noted  . Diabetes mellitus, type 2 10/31/2011  . Hyperglycemia 10/31/2011  . PNA (pneumonia) 10/30/2011  . LEG PAIN, RIGHT 01/20/2010   Past Medical History  Diagnosis Date  . Allergy   . Anemia   . Depression   . Diabetes mellitus without complication   . Anxiety   . Snores   . Complication of anesthesia     hard to wake up-was told he may have sleep apnea-never tested    Past Surgical History  Procedure Laterality Date  . Knee surgery Right 2011  . Hernia repair    . Elbow surgery Right 2010    Dr. Percell Miller  . Shoulder surgery Right 2010    Dr. Percell Miller  . Back surgery      no surgery  . Knee arthroscopy with medial menisectomy Right 06/11/2013    Procedure: RIGHT KNEE ARTHROSCOPY WITH MEDIAL MENISECTOMY AND CHONDROPLASTY;  Surgeon: Ninetta Lights, MD;  Location: [facility];  Service: Orthopedics;  Laterality: Right;  Chondroplasty, loose bodies, medial menisectomy, lysis of  adhesions.     (Not in a hospital admission) Allergies  Allergen Reactions  . Indocin [Indomethacin] Nausea And Vomiting  . Zanaflex [Tizanidine Hcl] Swelling    History  Substance Use Topics  . Smoking status: Current Every Day Smoker -- 0.50 packs/day for 25 years  . Smokeless tobacco: Not on file  . Alcohol Use: No    No family history on file.   Review of Systems  Constitutional: Negative.   HENT: Negative.   Eyes: Negative.   Respiratory: Negative.   Cardiovascular: Negative.   Gastrointestinal: Negative.   Genitourinary: Negative.   Musculoskeletal: Positive for back pain and joint pain.  Skin: Negative.   Neurological: Negative.   Endo/Heme/Allergies: Negative.   Psychiatric/Behavioral: Negative.     Objective:  Physical Exam  Constitutional: He is oriented to person, place, and time. He appears well-developed and well-nourished.  HENT:  Head: Normocephalic and atraumatic.  Eyes: EOM are normal. Pupils are equal, round, and reactive to light.  Neck: Normal range of motion. Neck supple.  Cardiovascular: Normal rate and regular rhythm.  Exam reveals no friction rub.   No murmur heard. Respiratory: Effort normal and breath sounds normal. No respiratory distress. He has no wheezes. He has no rales.  GI: Soft. Bowel sounds are normal.  Musculoskeletal:  Examination of his right knee reveals range of motion 0-120 degrees.  He is stable to valgus and varus stress.  Examination of his left knee reveals range of motion from 0-120 degrees.  Marked patellofemoral crepitus.  Medial joint line tenderness.  Nothing laterally.    Neurological: He is alert and oriented to person, place, and time.  Skin: Skin is warm and dry.  Psychiatric: He has a normal mood and affect. His behavior is normal. Judgment and thought content normal.    Vital signs in last 24 hours: @VSRANGES @  Labs:   Estimated body mass index is 33.93 kg/(m^2) as calculated from the following:   Height  as of 06/11/13: 6\' 2"  (1.88 m).   Weight as of 06/11/13: 119.92 kg (264 lb 6 oz).   Imaging Review Plain radiographs demonstrate severe degenerative joint disease of the right knee(s). The overall alignment isneutral. The bone quality appears to be fair for age and reported activity level.  Assessment/Plan:  End stage arthritis, right knee   The patient history, physical examination, clinical judgment of the provider and imaging studies are consistent with end stage degenerative joint disease of the right knee(s) and total knee arthroplasty is deemed medically necessary. The treatment options including medical management, injection therapy arthroscopy and arthroplasty were discussed at length. The risks and benefits of total knee arthroplasty were presented and reviewed. The risks due to aseptic loosening, infection, stiffness, patella tracking problems, thromboembolic complications and other imponderables were discussed. The patient acknowledged the explanation, agreed to proceed with the plan and consent was signed. Patient is being admitted for inpatient treatment for surgery, pain control, PT, OT, prophylactic antibiotics, VTE prophylaxis, progressive ambulation and ADL's and discharge planning. The patient is planning to be discharged home with home health services

## 2014-08-12 ENCOUNTER — Encounter (HOSPITAL_COMMUNITY): Payer: Self-pay

## 2014-08-12 ENCOUNTER — Encounter (HOSPITAL_COMMUNITY)
Admission: RE | Admit: 2014-08-12 | Discharge: 2014-08-12 | Disposition: A | Discharge status: Home / Self Care | Payer: BLUE CROSS/BLUE SHIELD | Source: Ambulatory Visit | Attending: Orthopedic Surgery | Admitting: Orthopedic Surgery

## 2014-08-12 DIAGNOSIS — G4733 Obstructive sleep apnea (adult) (pediatric): Secondary | ICD-10-CM | POA: Diagnosis not present

## 2014-08-12 DIAGNOSIS — Z79899 Other long term (current) drug therapy: Secondary | ICD-10-CM | POA: Insufficient documentation

## 2014-08-12 DIAGNOSIS — Z01818 Encounter for other preprocedural examination: Secondary | ICD-10-CM | POA: Diagnosis not present

## 2014-08-12 DIAGNOSIS — E119 Type 2 diabetes mellitus without complications: Secondary | ICD-10-CM | POA: Insufficient documentation

## 2014-08-12 DIAGNOSIS — M069 Rheumatoid arthritis, unspecified: Secondary | ICD-10-CM | POA: Insufficient documentation

## 2014-08-12 DIAGNOSIS — Z01812 Encounter for preprocedural laboratory examination: Secondary | ICD-10-CM | POA: Insufficient documentation

## 2014-08-12 DIAGNOSIS — R9431 Abnormal electrocardiogram [ECG] [EKG]: Secondary | ICD-10-CM | POA: Diagnosis not present

## 2014-08-12 DIAGNOSIS — M1711 Unilateral primary osteoarthritis, right knee: Secondary | ICD-10-CM | POA: Insufficient documentation

## 2014-08-12 DIAGNOSIS — Z0183 Encounter for blood typing: Secondary | ICD-10-CM | POA: Diagnosis not present

## 2014-08-12 HISTORY — DX: Unspecified osteoarthritis, unspecified site: M19.90

## 2014-08-12 HISTORY — DX: Sleep apnea, unspecified: G47.30

## 2014-08-12 LAB — COMPREHENSIVE METABOLIC PANEL
ALBUMIN: 3.8 g/dL (ref 3.5–5.0)
ALT: 23 U/L (ref 17–63)
AST: 27 U/L (ref 15–41)
Alkaline Phosphatase: 113 U/L (ref 38–126)
Anion gap: 7 (ref 5–15)
BILIRUBIN TOTAL: 0.5 mg/dL (ref 0.3–1.2)
BUN: 7 mg/dL (ref 6–20)
CHLORIDE: 102 mmol/L (ref 101–111)
CO2: 27 mmol/L (ref 22–32)
Calcium: 8.7 mg/dL — ABNORMAL LOW (ref 8.9–10.3)
Creatinine, Ser: 0.88 mg/dL (ref 0.61–1.24)
GFR calc non Af Amer: >60 mL/min (ref >60)
Glucose, Bld: 149 mg/dL — ABNORMAL HIGH (ref 65–99)
POTASSIUM: 3.8 mmol/L (ref 3.5–5.1)
Sodium: 136 mmol/L (ref 135–145)
Total Protein: 6.5 g/dL (ref 6.5–8.1)

## 2014-08-12 LAB — CBC WITH DIFFERENTIAL/PLATELET
BASOS ABS: 0 10*3/uL (ref 0.0–0.1)
BASOS PCT: 0 % (ref 0–1)
EOS ABS: 0.3 10*3/uL (ref 0.0–0.7)
Eosinophils Relative: 4 % (ref 0–5)
HCT: 39.3 % (ref 39.0–52.0)
Hemoglobin: 13.8 g/dL (ref 13.0–17.0)
LYMPHS PCT: 45 % (ref 12–46)
Lymphs Abs: 3 10*3/uL (ref 0.7–4.0)
MCH: 29.6 pg (ref 26.0–34.0)
MCHC: 35.1 g/dL (ref 30.0–36.0)
MCV: 84.2 fL (ref 78.0–100.0)
Monocytes Absolute: 0.6 10*3/uL (ref 0.1–1.0)
Monocytes Relative: 8 % (ref 3–12)
NEUTROS ABS: 2.9 10*3/uL (ref 1.7–7.7)
Neutrophils Relative %: 43 % (ref 43–77)
PLATELETS: 186 10*3/uL (ref 150–400)
RBC: 4.67 MIL/uL (ref 4.22–5.81)
RDW: 12.9 % (ref 11.5–15.5)
WBC: 6.7 10*3/uL (ref 4.0–10.5)

## 2014-08-12 LAB — PROTIME-INR
INR: 0.95 (ref 0.00–1.49)
PROTHROMBIN TIME: 12.8 s (ref 11.6–15.2)

## 2014-08-12 LAB — SURGICAL PCR SCREEN
MRSA, PCR: NEGATIVE
STAPHYLOCOCCUS AUREUS: NEGATIVE

## 2014-08-12 LAB — APTT: APTT: 30 s (ref 24–37)

## 2014-08-12 LAB — ABO/RH: ABO/RH(D): B POS

## 2014-08-12 LAB — TYPE AND SCREEN
ABO/RH(D): B POS
ANTIBODY SCREEN: NEGATIVE

## 2014-08-12 LAB — GLUCOSE, CAPILLARY: GLUCOSE-CAPILLARY: 129 mg/dL — AB (ref 65–99)

## 2014-08-12 NOTE — Progress Notes (Addendum)
Open places noted on arms pt states they are almost healed, but he keeps them covered with band aids. PCP is Dr Briscoe Deutscher Denies seeing a cardiologist Denies ever having a card cath, stress test, or echo. Reports that he doesn't take his blood sugars often, but when he does they run 110-120's States he had a sleep study, but was never able to wear the Cpap Dr Delman Kitten office called for last hgba1c was done last in Feb of this year order placed for hgba1c on the day of surgery.

## 2014-08-12 NOTE — Pre-Procedure Instructions (Signed)
Calum Cormier  08/12/2014      [id number] - [place], [place] - [address] [place] [address] Phone: [phone] Fax: [phone]    Your procedure is scheduled on Aug. 3  Report to [facility] at 1100 A.M.  Call this number if you have problems the morning of surgery:  [phone]   Remember:  Do not eat food or drink liquids after midnight.  Take these medicines the morning of surgery with A SIP OF WATER allopurinol, Flexeril if needed, Oxycodone if needed, Cymbalta Stop taking aspirin, Ibuprofen, BC's, Goody's herbal medications, Fish Oil, Aleve    Do not wear jewelry, make-up or nail polish.  Do not wear lotions, powders, or perfumes.  You may wear deodorant.  Do not shave 48 hours prior to surgery.  Men may shave face and neck.  Do not bring valuables to the hospital.  [facility] is not responsible for any belongings or valuables.  Contacts, dentures or bridgework may not be worn into surgery.  Leave your suitcase in the car.  After surgery it may be brought to your room.  For patients admitted to the hospital, discharge time will be determined by your treatment team.  Patients discharged the day of surgery will not be allowed to drive home.    Special instructions:  [place] - Preparing for Surgery  Before surgery, you can play an important role.  Because skin is not sterile, your skin needs to be as free of germs as possible.  You can reduce the number of germs on you skin by washing with CHG (chlorahexidine gluconate) soap before surgery.  CHG is an antiseptic cleaner which kills germs and bonds with the skin to continue killing germs even after washing.  Please DO NOT use if you have an allergy to CHG or antibacterial soaps.  If your skin becomes reddened/irritated stop using the CHG and inform your nurse when you arrive at Short Stay.  Do not shave (including legs and  underarms) for at least 48 hours prior to the first CHG shower.  You may shave your face.  Please follow these instructions carefully:   1.  Shower with CHG Soap the night before surgery and the    morning of Surgery.  2.  If you choose to wash your hair, wash your hair first as usual with your  normal shampoo.  3.  After you shampoo, rinse your hair and body thoroughly to remove the  Shampoo.  4.  Use CHG as you would any other liquid soap.  You can apply chg directly  to the skin and wash gently with scrungie or a clean washcloth.  5.  Apply the CHG Soap to your body ONLY FROM THE NECK DOWN.  Do not use on open wounds or open sores.  Avoid contact with your eyes,   ears, mouth and genitals (private parts).  Wash genitals (private parts)    with your normal soap.  6.  Wash thoroughly, paying special attention to the area where your surgery  will be performed.  7.  Thoroughly rinse your body with warm water from the neck down.  8.  DO NOT shower/wash with your normal soap after using and rinsing off  the CHG Soap.  9.  Pat yourself dry with a clean towel.            10.  Wear clean pajamas.  11.  Place clean sheets on your bed the night of your first shower and do not  sleep with pets.  Day of Surgery  Do not apply any lotions/deoderants the morning of surgery.  Please wear clean clothes to the hospital/surgery center.     Please read over the following fact sheets that you were given. Pain Booklet, Coughing and Deep Breathing, Blood Transfusion Information, MRSA Information and Surgical Site Infection Prevention

## 2014-08-13 LAB — URINE CULTURE: CULTURE: NO GROWTH

## 2014-08-16 ENCOUNTER — Encounter (HOSPITAL_COMMUNITY): Payer: Self-pay

## 2014-08-16 NOTE — Progress Notes (Signed)
Anesthesia Chart Review: Patient is a 49 year old male scheduled for right TKA on 08/25/14 by Dr. Kathryne Hitch.  History includes smoking, anemia, depression, anxiety, DM2, arthritis (RA), back surgery. He was told he was hard to wake up after a previous surgery, but may have been related to undiagnosed OSAo--he now reports OSA history but has been intolerant to CPAP. BMI is consistent with obesity. PCP is Dr. Briscoe Deutscher.  Meds include Humira (on hold), allopurinol, Xanax, Adderall XR, Bumex, Flexeril, Cymbalta, Allegra, Folvite, glipizide, metformin, methotrexate (on hold), Remeron, oxycodone.  08/12/14 EKG: NSR, possible LAE, cannot rule out anterior infarct (age undetermined).  Overall, I think his EKG is stable when compared to prior tracings in Epic dating back to 10/29/11. Patient denied prior cardiac testing.   Preoperative labs noted. He is for an A1C on arrival (last A1C at PCP was > 60 days). Patient reported intermittent home glucose monitoring with typical results ~ 110-120's.   If no acute changes then I anticipate that he can proceed as planned.  George Hugh [facility] Short Stay Center/Anesthesiology Phone [phone] 08/16/2014 6:30 PM

## 2014-08-24 NOTE — Progress Notes (Signed)
Attempted to reach patient x4. Patient phone was full and not accepting messages. Called Dr. Debroah Loop office and patient did not answer, nor could I leave a message. The second number was a former employer.

## 2014-08-24 NOTE — Progress Notes (Signed)
Leory Plowman, RN spoke with patient, informed  him of surgery time change and to arrive at short stay at 0930. Pt Voiced understanding.

## 2014-08-24 NOTE — Progress Notes (Signed)
Attempted to call patient's emergency contact to try to reach patient about time change.  Voice mail not set up at [phone]

## 2014-08-25 ENCOUNTER — Inpatient Hospital Stay (HOSPITAL_COMMUNITY): Payer: BLUE CROSS/BLUE SHIELD | Admitting: Certified Registered Nurse Anesthetist

## 2014-08-25 ENCOUNTER — Inpatient Hospital Stay (HOSPITAL_COMMUNITY)
Admission: RE | Admit: 2014-08-25 | Discharge: 2014-08-26 | DRG: 470 | Disposition: A | Discharge status: Home Health | Payer: BLUE CROSS/BLUE SHIELD | Source: Ambulatory Visit | Attending: Orthopedic Surgery | Admitting: Orthopedic Surgery

## 2014-08-25 ENCOUNTER — Inpatient Hospital Stay (HOSPITAL_COMMUNITY): Payer: BLUE CROSS/BLUE SHIELD | Admitting: Vascular Surgery

## 2014-08-25 ENCOUNTER — Encounter (HOSPITAL_COMMUNITY)
Admission: RE | Disposition: A | Discharge status: Home Health | Payer: Self-pay | Source: Ambulatory Visit | Attending: Orthopedic Surgery

## 2014-08-25 ENCOUNTER — Inpatient Hospital Stay (HOSPITAL_COMMUNITY): Payer: BLUE CROSS/BLUE SHIELD

## 2014-08-25 ENCOUNTER — Encounter (HOSPITAL_COMMUNITY): Payer: Self-pay | Admitting: *Deleted

## 2014-08-25 DIAGNOSIS — M1711 Unilateral primary osteoarthritis, right knee: Principal | ICD-10-CM | POA: Diagnosis present

## 2014-08-25 DIAGNOSIS — M1710 Unilateral primary osteoarthritis, unspecified knee: Secondary | ICD-10-CM | POA: Diagnosis present

## 2014-08-25 DIAGNOSIS — F17210 Nicotine dependence, cigarettes, uncomplicated: Secondary | ICD-10-CM | POA: Diagnosis present

## 2014-08-25 DIAGNOSIS — D62 Acute posthemorrhagic anemia: Secondary | ICD-10-CM | POA: Diagnosis not present

## 2014-08-25 DIAGNOSIS — Z96659 Presence of unspecified artificial knee joint: Secondary | ICD-10-CM

## 2014-08-25 DIAGNOSIS — E119 Type 2 diabetes mellitus without complications: Secondary | ICD-10-CM | POA: Diagnosis present

## 2014-08-25 DIAGNOSIS — M25561 Pain in right knee: Secondary | ICD-10-CM | POA: Diagnosis present

## 2014-08-25 DIAGNOSIS — M179 Osteoarthritis of knee, unspecified: Secondary | ICD-10-CM | POA: Diagnosis present

## 2014-08-25 HISTORY — PX: TOTAL KNEE ARTHROPLASTY: SHX125

## 2014-08-25 LAB — GLUCOSE, CAPILLARY
GLUCOSE-CAPILLARY: 89 mg/dL (ref 65–99)
GLUCOSE-CAPILLARY: 73 mg/dL (ref 65–99)
Glucose-Capillary: 98 mg/dL (ref 65–99)
Glucose-Capillary: 108 mg/dL — ABNORMAL HIGH (ref 65–99)
Glucose-Capillary: 92 mg/dL (ref 65–99)

## 2014-08-25 SURGERY — ARTHROPLASTY, KNEE, TOTAL
Anesthesia: Regional | Site: Knee | Laterality: Right

## 2014-08-25 MED ORDER — ONDANSETRON HCL 4 MG/2ML IJ SOLN: 4.0000 mg | Freq: Four times a day (QID) | INTRAMUSCULAR | Status: DC | PRN

## 2014-08-25 MED ORDER — HYDROMORPHONE HCL 1 MG/ML IJ SOLN
INTRAMUSCULAR | Status: AC
  Filled 2014-08-25: qty 1

## 2014-08-25 MED ORDER — ACETAMINOPHEN 325 MG PO TABS: 650.0000 mg | ORAL_TABLET | Freq: Four times a day (QID) | ORAL | Status: DC | PRN

## 2014-08-25 MED ORDER — CEFAZOLIN SODIUM-DEXTROSE 2-3 GM-% IV SOLR
2.0000 g | INTRAVENOUS | Status: AC
  Administered 2014-08-25: 2 g via INTRAVENOUS

## 2014-08-25 MED ORDER — SUCCINYLCHOLINE CHLORIDE 20 MG/ML IJ SOLN
INTRAMUSCULAR | Status: AC
  Filled 2014-08-25: qty 2

## 2014-08-25 MED ORDER — METHOTREXATE 2.5 MG PO TABS: 20.0000 mg | ORAL_TABLET | ORAL | Status: DC

## 2014-08-25 MED ORDER — ONDANSETRON HCL 4 MG/2ML IJ SOLN
INTRAMUSCULAR | Status: DC | PRN
  Administered 2014-08-25: 4 mg via INTRAVENOUS

## 2014-08-25 MED ORDER — HYDROMORPHONE HCL 1 MG/ML IJ SOLN
0.5000 mg | INTRAMUSCULAR | Status: DC | PRN
  Administered 2014-08-25 (×2): 0.5 mg via INTRAVENOUS
  Administered 2014-08-25: 1 mg via INTRAVENOUS

## 2014-08-25 MED ORDER — LIDOCAINE HCL (CARDIAC) 20 MG/ML IV SOLN
INTRAVENOUS | Status: AC
  Filled 2014-08-25: qty 15

## 2014-08-25 MED ORDER — BISACODYL 5 MG PO TBEC: 5.0000 mg | DELAYED_RELEASE_TABLET | Freq: Every day | ORAL | Status: DC | PRN

## 2014-08-25 MED ORDER — MIDAZOLAM HCL 2 MG/2ML IJ SOLN
INTRAMUSCULAR | Status: AC
  Filled 2014-08-25: qty 2

## 2014-08-25 MED ORDER — ALPRAZOLAM 0.5 MG PO TABS
1.0000 mg | ORAL_TABLET | Freq: Every evening | ORAL | Status: DC | PRN
  Administered 2014-08-25: 1 mg via ORAL
  Filled 2014-08-25: qty 2

## 2014-08-25 MED ORDER — BUPIVACAINE LIPOSOME 1.3 % IJ SUSP
20.0000 mL | INTRAMUSCULAR | Status: DC
  Filled 2014-08-25 (×2): qty 20

## 2014-08-25 MED ORDER — LACTATED RINGERS IV SOLN
INTRAVENOUS | Status: DC
  Administered 2014-08-25 (×2): via INTRAVENOUS

## 2014-08-25 MED ORDER — CHLORHEXIDINE GLUCONATE 4 % EX LIQD: 60.0000 mL | Freq: Once | CUTANEOUS | Status: DC

## 2014-08-25 MED ORDER — HYDROMORPHONE HCL 1 MG/ML IJ SOLN
0.5000 mg | INTRAMUSCULAR | Status: DC | PRN
  Administered 2014-08-25 – 2014-08-26 (×6): 1 mg via INTRAVENOUS
  Filled 2014-08-25 (×6): qty 1

## 2014-08-25 MED ORDER — ACETAMINOPHEN 10 MG/ML IV SOLN
INTRAVENOUS | Status: AC
Start: 2014-08-25 — End: 2014-08-26
  Filled 2014-08-25: qty 100

## 2014-08-25 MED ORDER — DIPHENHYDRAMINE HCL 12.5 MG/5ML PO ELIX: 12.5000 mg | ORAL_SOLUTION | ORAL | Status: DC | PRN

## 2014-08-25 MED ORDER — BUPIVACAINE HCL 0.5 % IJ SOLN
INTRAMUSCULAR | Status: DC | PRN
  Administered 2014-08-25: 10 mL

## 2014-08-25 MED ORDER — MIRTAZAPINE 15 MG PO TABS
30.0000 mg | ORAL_TABLET | Freq: Every day | ORAL | Status: DC
  Administered 2014-08-25: 30 mg via ORAL
  Filled 2014-08-25: qty 2

## 2014-08-25 MED ORDER — PROPOFOL 10 MG/ML IV BOLUS
INTRAVENOUS | Status: DC | PRN
  Administered 2014-08-25: 250 mg via INTRAVENOUS

## 2014-08-25 MED ORDER — LIDOCAINE HCL (CARDIAC) 20 MG/ML IV SOLN
INTRAVENOUS | Status: DC | PRN
  Administered 2014-08-25: 80 mg via INTRAVENOUS

## 2014-08-25 MED ORDER — POLYETHYLENE GLYCOL 3350 17 G PO PACK: 17.0000 g | PACK | Freq: Every day | ORAL | Status: DC | PRN

## 2014-08-25 MED ORDER — MENTHOL 3 MG MT LOZG: 1.0000 | LOZENGE | OROMUCOSAL | Status: DC | PRN

## 2014-08-25 MED ORDER — METOCLOPRAMIDE HCL 5 MG PO TABS: 5.0000 mg | ORAL_TABLET | Freq: Three times a day (TID) | ORAL | Status: DC | PRN

## 2014-08-25 MED ORDER — CELECOXIB 200 MG PO CAPS
200.0000 mg | ORAL_CAPSULE | Freq: Two times a day (BID) | ORAL | Status: DC
  Administered 2014-08-25 – 2014-08-26 (×2): 200 mg via ORAL
  Filled 2014-08-25 (×2): qty 1

## 2014-08-25 MED ORDER — ROCURONIUM BROMIDE 50 MG/5ML IV SOLN
INTRAVENOUS | Status: AC
  Filled 2014-08-25: qty 2

## 2014-08-25 MED ORDER — ONDANSETRON HCL 4 MG PO TABS: 4.0000 mg | ORAL_TABLET | Freq: Four times a day (QID) | ORAL | Status: DC | PRN

## 2014-08-25 MED ORDER — FENTANYL CITRATE (PF) 250 MCG/5ML IJ SOLN
INTRAMUSCULAR | Status: AC
  Filled 2014-08-25: qty 5

## 2014-08-25 MED ORDER — OXYCODONE HCL 5 MG PO TABS
5.0000 mg | ORAL_TABLET | ORAL | Status: DC | PRN
  Administered 2014-08-25 – 2014-08-26 (×6): 10 mg via ORAL
  Filled 2014-08-25 (×5): qty 2

## 2014-08-25 MED ORDER — MIDAZOLAM HCL 2 MG/2ML IJ SOLN
INTRAMUSCULAR | Status: AC
  Filled 2014-08-25: qty 4

## 2014-08-25 MED ORDER — SODIUM CHLORIDE 0.9 % IR SOLN
Status: DC | PRN
  Administered 2014-08-25: 3000 mL

## 2014-08-25 MED ORDER — BUPIVACAINE-EPINEPHRINE (PF) 0.5% -1:200000 IJ SOLN
INTRAMUSCULAR | Status: AC
  Filled 2014-08-25: qty 30

## 2014-08-25 MED ORDER — KETOROLAC TROMETHAMINE 30 MG/ML IJ SOLN
INTRAMUSCULAR | Status: DC | PRN
  Administered 2014-08-25: 30 mg via INTRAVENOUS

## 2014-08-25 MED ORDER — HYDROMORPHONE HCL 4 MG PO TABS: ORAL_TABLET | ORAL | Status: DC

## 2014-08-25 MED ORDER — ONDANSETRON HCL 4 MG PO TABS: 4.0000 mg | ORAL_TABLET | Freq: Three times a day (TID) | ORAL | Status: DC | PRN

## 2014-08-25 MED ORDER — HYDROMORPHONE HCL 1 MG/ML IJ SOLN
0.2500 mg | INTRAMUSCULAR | Status: DC | PRN
  Administered 2014-08-25 (×4): 0.5 mg via INTRAVENOUS

## 2014-08-25 MED ORDER — DOCUSATE SODIUM 100 MG PO CAPS
100.0000 mg | ORAL_CAPSULE | Freq: Two times a day (BID) | ORAL | Status: DC
Start: 2014-08-25 — End: 2014-08-26
  Administered 2014-08-25 – 2014-08-26 (×2): 100 mg via ORAL
  Filled 2014-08-25 (×2): qty 1

## 2014-08-25 MED ORDER — OXYCODONE HCL 5 MG PO TABS: 5.0000 mg | ORAL_TABLET | Freq: Once | ORAL | Status: DC | PRN

## 2014-08-25 MED ORDER — OXYCODONE HCL 5 MG/5ML PO SOLN: 5.0000 mg | Freq: Once | ORAL | Status: DC | PRN

## 2014-08-25 MED ORDER — BUPIVACAINE-EPINEPHRINE (PF) 0.5% -1:200000 IJ SOLN
INTRAMUSCULAR | Status: DC | PRN
  Administered 2014-08-25: 30 mL via PERINEURAL

## 2014-08-25 MED ORDER — ROCURONIUM BROMIDE 100 MG/10ML IV SOLN
INTRAVENOUS | Status: DC | PRN
  Administered 2014-08-25: 50 mg via INTRAVENOUS

## 2014-08-25 MED ORDER — MAGNESIUM CITRATE PO SOLN: 1.0000 | Freq: Once | ORAL | Status: AC | PRN

## 2014-08-25 MED ORDER — CEFAZOLIN SODIUM-DEXTROSE 2-3 GM-% IV SOLR
INTRAVENOUS | Status: AC
  Filled 2014-08-25: qty 50

## 2014-08-25 MED ORDER — METHOCARBAMOL 1000 MG/10ML IJ SOLN
500.0000 mg | Freq: Four times a day (QID) | INTRAMUSCULAR | Status: DC | PRN
  Filled 2014-08-25: qty 5

## 2014-08-25 MED ORDER — FENTANYL CITRATE (PF) 100 MCG/2ML IJ SOLN
INTRAMUSCULAR | Status: AC
  Administered 2014-08-25: 100 ug
  Filled 2014-08-25: qty 2

## 2014-08-25 MED ORDER — CEFAZOLIN SODIUM-DEXTROSE 2-3 GM-% IV SOLR
2.0000 g | Freq: Four times a day (QID) | INTRAVENOUS | Status: AC
  Administered 2014-08-25 (×2): 2 g via INTRAVENOUS
  Filled 2014-08-25 (×2): qty 50

## 2014-08-25 MED ORDER — POTASSIUM CHLORIDE IN NACL 20-0.9 MEQ/L-% IV SOLN
INTRAVENOUS | Status: DC
  Administered 2014-08-25: 18:00:00 via INTRAVENOUS
  Filled 2014-08-25: qty 1000

## 2014-08-25 MED ORDER — EPHEDRINE SULFATE 50 MG/ML IJ SOLN
INTRAMUSCULAR | Status: AC
  Filled 2014-08-25: qty 1

## 2014-08-25 MED ORDER — SODIUM CHLORIDE 0.9 % IV SOLN
INTRAVENOUS | Status: DC | PRN
  Administered 2014-08-25: 60 mL

## 2014-08-25 MED ORDER — ACETAMINOPHEN 10 MG/ML IV SOLN
1000.0000 mg | Freq: Four times a day (QID) | INTRAVENOUS | Status: DC
  Administered 2014-08-25: 1000 mg via INTRAVENOUS

## 2014-08-25 MED ORDER — FENTANYL CITRATE (PF) 100 MCG/2ML IJ SOLN
INTRAMUSCULAR | Status: DC | PRN
  Administered 2014-08-25: 50 ug via INTRAVENOUS
  Administered 2014-08-25: 100 ug via INTRAVENOUS
  Administered 2014-08-25: 50 ug via INTRAVENOUS
  Administered 2014-08-25: 100 ug via INTRAVENOUS
  Administered 2014-08-25: 50 ug via INTRAVENOUS
  Administered 2014-08-25: 100 ug via INTRAVENOUS
  Administered 2014-08-25: 50 ug via INTRAVENOUS
  Administered 2014-08-25: 100 ug via INTRAVENOUS
  Administered 2014-08-25: 50 ug via INTRAVENOUS
  Administered 2014-08-25: 100 ug via INTRAVENOUS

## 2014-08-25 MED ORDER — GLYCOPYRROLATE 0.2 MG/ML IJ SOLN
INTRAMUSCULAR | Status: AC
  Filled 2014-08-25: qty 7

## 2014-08-25 MED ORDER — DULOXETINE HCL 60 MG PO CPEP
60.0000 mg | ORAL_CAPSULE | Freq: Two times a day (BID) | ORAL | Status: DC
  Administered 2014-08-25 – 2014-08-26 (×2): 60 mg via ORAL
  Filled 2014-08-25 (×2): qty 1

## 2014-08-25 MED ORDER — METFORMIN HCL ER 500 MG PO TB24
500.0000 mg | ORAL_TABLET | Freq: Every day | ORAL | Status: DC
  Administered 2014-08-26: 500 mg via ORAL
  Filled 2014-08-25: qty 1

## 2014-08-25 MED ORDER — ALLOPURINOL 300 MG PO TABS
300.0000 mg | ORAL_TABLET | Freq: Every day | ORAL | Status: DC
  Administered 2014-08-26: 300 mg via ORAL
  Filled 2014-08-25: qty 1

## 2014-08-25 MED ORDER — INSULIN ASPART 100 UNIT/ML ~~LOC~~ SOLN: 0.0000 [IU] | Freq: Three times a day (TID) | SUBCUTANEOUS | Status: DC

## 2014-08-25 MED ORDER — SODIUM CHLORIDE 0.9 % IJ SOLN
INTRAMUSCULAR | Status: AC
  Filled 2014-08-25: qty 10

## 2014-08-25 MED ORDER — GLIPIZIDE ER 5 MG PO TB24
5.0000 mg | ORAL_TABLET | Freq: Every day | ORAL | Status: DC
  Administered 2014-08-26: 5 mg via ORAL
  Filled 2014-08-25 (×3): qty 1

## 2014-08-25 MED ORDER — APIXABAN 2.5 MG PO TABS
2.5000 mg | ORAL_TABLET | Freq: Two times a day (BID) | ORAL | Status: DC
  Administered 2014-08-26: 2.5 mg via ORAL
  Filled 2014-08-25: qty 1

## 2014-08-25 MED ORDER — OXYCODONE HCL ER 10 MG PO T12A
20.0000 mg | EXTENDED_RELEASE_TABLET | Freq: Two times a day (BID) | ORAL | Status: DC
  Administered 2014-08-25 – 2014-08-26 (×2): 20 mg via ORAL
  Filled 2014-08-25 (×2): qty 2

## 2014-08-25 MED ORDER — ACETAMINOPHEN 650 MG RE SUPP: 650.0000 mg | Freq: Four times a day (QID) | RECTAL | Status: DC | PRN

## 2014-08-25 MED ORDER — PHENYLEPHRINE 40 MCG/ML (10ML) SYRINGE FOR IV PUSH (FOR BLOOD PRESSURE SUPPORT)
PREFILLED_SYRINGE | INTRAVENOUS | Status: AC
  Filled 2014-08-25: qty 30

## 2014-08-25 MED ORDER — MIDAZOLAM HCL 2 MG/2ML IJ SOLN
INTRAMUSCULAR | Status: AC
  Administered 2014-08-25: 2 mg
  Filled 2014-08-25: qty 2

## 2014-08-25 MED ORDER — PHENOL 1.4 % MT LIQD: 1.0000 | OROMUCOSAL | Status: DC | PRN

## 2014-08-25 MED ORDER — OXYCODONE HCL 5 MG PO TABS
ORAL_TABLET | ORAL | Status: AC
  Filled 2014-08-25: qty 2

## 2014-08-25 MED ORDER — NEOSTIGMINE METHYLSULFATE 10 MG/10ML IV SOLN
INTRAVENOUS | Status: AC
  Filled 2014-08-25: qty 3

## 2014-08-25 MED ORDER — NEOSTIGMINE METHYLSULFATE 10 MG/10ML IV SOLN
INTRAVENOUS | Status: DC | PRN
  Administered 2014-08-25: 3 mg via INTRAVENOUS

## 2014-08-25 MED ORDER — MIDAZOLAM HCL 2 MG/2ML IJ SOLN
1.0000 mg | INTRAMUSCULAR | Status: DC | PRN
  Administered 2014-08-25 (×3): 1 mg via INTRAVENOUS

## 2014-08-25 MED ORDER — APIXABAN 2.5 MG PO TABS: ORAL_TABLET | ORAL | Status: DC

## 2014-08-25 MED ORDER — METOCLOPRAMIDE HCL 5 MG/ML IJ SOLN: 5.0000 mg | Freq: Three times a day (TID) | INTRAMUSCULAR | Status: DC | PRN

## 2014-08-25 MED ORDER — METHOCARBAMOL 500 MG PO TABS
ORAL_TABLET | ORAL | Status: AC
  Filled 2014-08-25: qty 1

## 2014-08-25 MED ORDER — ONDANSETRON HCL 4 MG/2ML IJ SOLN
INTRAMUSCULAR | Status: AC
  Filled 2014-08-25: qty 8

## 2014-08-25 MED ORDER — GLYCOPYRROLATE 0.2 MG/ML IJ SOLN
INTRAMUSCULAR | Status: DC | PRN
  Administered 2014-08-25: .4 mg via INTRAVENOUS

## 2014-08-25 MED ORDER — METHOTREXATE (ANTI-RHEUMATIC) 2.5 MG PO TABS: 20.0000 mg | ORAL_TABLET | ORAL | Status: DC

## 2014-08-25 MED ORDER — METHOCARBAMOL 500 MG PO TABS
500.0000 mg | ORAL_TABLET | Freq: Four times a day (QID) | ORAL | Status: DC | PRN
  Administered 2014-08-25 – 2014-08-26 (×4): 500 mg via ORAL
  Filled 2014-08-25 (×3): qty 1

## 2014-08-25 MED ORDER — CYCLOBENZAPRINE HCL 10 MG PO TABS: 10.0000 mg | ORAL_TABLET | Freq: Three times a day (TID) | ORAL | Status: DC | PRN

## 2014-08-25 SURGICAL SUPPLY — 69 items
APL SKNCLS STERI-STRIP NONHPOA (GAUZE/BANDAGES/DRESSINGS) ×1
BANDAGE ELASTIC 4 VELCRO ST LF (GAUZE/BANDAGES/DRESSINGS) ×2 IMPLANT
BANDAGE ELASTIC 6 VELCRO ST LF (GAUZE/BANDAGES/DRESSINGS) ×2 IMPLANT
BANDAGE ESMARK 6X9 LF (GAUZE/BANDAGES/DRESSINGS) ×1 IMPLANT
BENZOIN TINCTURE PRP APPL 2/3 (GAUZE/BANDAGES/DRESSINGS) ×2 IMPLANT
BLADE SAG 18X100X1.27 (BLADE) ×4 IMPLANT
BNDG CMPR 9X6 STRL LF SNTH (GAUZE/BANDAGES/DRESSINGS) ×1
BNDG ESMARK 6X9 LF (GAUZE/BANDAGES/DRESSINGS) ×2
BOWL SMART MIX CTS (DISPOSABLE) ×2 IMPLANT
CAPT KNEE TOTAL 3 ×1 IMPLANT
CEMENT BONE SIMPLEX SPEEDSET (Cement) ×4 IMPLANT
CLSR STERI-STRIP ANTIMIC 1/2X4 (GAUZE/BANDAGES/DRESSINGS) ×1 IMPLANT
COVER SURGICAL LIGHT HANDLE (MISCELLANEOUS) ×2 IMPLANT
CUFF TOURNIQUET SINGLE 34IN LL (TOURNIQUET CUFF) ×2 IMPLANT
DRAPE EXTREMITY T 121X128X90 (DRAPE) ×2 IMPLANT
DRAPE IMP U-DRAPE 54X76 (DRAPES) ×2 IMPLANT
DRAPE PROXIMA HALF (DRAPES) ×2 IMPLANT
DRAPE U-SHAPE 47X51 STRL (DRAPES) ×2 IMPLANT
DRSG PAD ABDOMINAL 8X10 ST (GAUZE/BANDAGES/DRESSINGS) ×2 IMPLANT
DURAPREP 26ML APPLICATOR (WOUND CARE) ×4 IMPLANT
ELECT CAUTERY BLADE 6.4 (BLADE) ×2 IMPLANT
ELECT REM PT RETURN 9FT ADLT (ELECTROSURGICAL) ×2
ELECTRODE REM PT RTRN 9FT ADLT (ELECTROSURGICAL) ×1 IMPLANT
EVACUATOR 1/8 PVC DRAIN (DRAIN) ×2 IMPLANT
FACESHIELD WRAPAROUND (MASK) ×4 IMPLANT
FACESHIELD WRAPAROUND OR TEAM (MASK) ×2 IMPLANT
GAUZE SPONGE 4X4 12PLY STRL (GAUZE/BANDAGES/DRESSINGS) ×2 IMPLANT
GLOVE BIOGEL PI IND STRL 7.0 (GLOVE) ×1 IMPLANT
GLOVE BIOGEL PI INDICATOR 7.0 (GLOVE) ×1
GLOVE ORTHO TXT STRL SZ7.5 (GLOVE) ×2 IMPLANT
GLOVE SURG ORTHO 7.0 STRL STRW (GLOVE) ×2 IMPLANT
GOWN STRL REUS W/ TWL LRG LVL3 (GOWN DISPOSABLE) ×2 IMPLANT
GOWN STRL REUS W/ TWL XL LVL3 (GOWN DISPOSABLE) ×1 IMPLANT
GOWN STRL REUS W/TWL LRG LVL3 (GOWN DISPOSABLE) ×4
GOWN STRL REUS W/TWL XL LVL3 (GOWN DISPOSABLE) ×2
HANDPIECE INTERPULSE COAX TIP (DISPOSABLE) ×2
IMMOBILIZER KNEE 22 UNIV (SOFTGOODS) ×2 IMPLANT
IMMOBILIZER KNEE 24 THIGH 36 (MISCELLANEOUS) IMPLANT
IMMOBILIZER KNEE 24 UNIV (MISCELLANEOUS)
KIT BASIN OR (CUSTOM PROCEDURE TRAY) ×2 IMPLANT
KIT ROOM TURNOVER OR (KITS) ×2 IMPLANT
MANIFOLD NEPTUNE II (INSTRUMENTS) ×2 IMPLANT
NDL 18GX1X1/2 (RX/OR ONLY) (NEEDLE) ×1 IMPLANT
NDL HYPO 25GX1X1/2 BEV (NEEDLE) ×1 IMPLANT
NEEDLE 18GX1X1/2 (RX/OR ONLY) (NEEDLE) ×2 IMPLANT
NEEDLE HYPO 25GX1X1/2 BEV (NEEDLE) ×2 IMPLANT
NS IRRIG 1000ML POUR BTL (IV SOLUTION) ×2 IMPLANT
PACK TOTAL JOINT (CUSTOM PROCEDURE TRAY) ×2 IMPLANT
PACK UNIVERSAL I (CUSTOM PROCEDURE TRAY) ×2 IMPLANT
PAD ARMBOARD 7.5X6 YLW CONV (MISCELLANEOUS) ×4 IMPLANT
PAD CAST 4YDX4 CTTN HI CHSV (CAST SUPPLIES) ×1 IMPLANT
PADDING CAST COTTON 4X4 STRL (CAST SUPPLIES) ×2
PADDING CAST COTTON 6X4 STRL (CAST SUPPLIES) ×1 IMPLANT
SET HNDPC FAN SPRY TIP SCT (DISPOSABLE) ×1 IMPLANT
SPONGE GAUZE 4X4 12PLY STER LF (GAUZE/BANDAGES/DRESSINGS) ×1 IMPLANT
STRIP CLOSURE SKIN 1/2X4 (GAUZE/BANDAGES/DRESSINGS) ×4 IMPLANT
SUCTION FRAZIER TIP 10 FR DISP (SUCTIONS) ×2 IMPLANT
SUT MNCRL AB 4-0 PS2 18 (SUTURE) ×2 IMPLANT
SUT VIC AB 0 CT1 27 (SUTURE)
SUT VIC AB 0 CT1 27XBRD ANBCTR (SUTURE) IMPLANT
SUT VIC AB 1 CTX 36 (SUTURE) ×2
SUT VIC AB 1 CTX36XBRD ANBCTR (SUTURE) ×1 IMPLANT
SUT VIC AB 2-0 CT1 27 (SUTURE) ×4
SUT VIC AB 2-0 CT1 TAPERPNT 27 (SUTURE) ×2 IMPLANT
SYR 50ML LL SCALE MARK (SYRINGE) ×2 IMPLANT
SYR CONTROL 10ML LL (SYRINGE) ×2 IMPLANT
TOWEL OR 17X24 6PK STRL BLUE (TOWEL DISPOSABLE) ×2 IMPLANT
TOWEL OR 17X26 10 PK STRL BLUE (TOWEL DISPOSABLE) ×2 IMPLANT
WATER STERILE IRR 1000ML POUR (IV SOLUTION) ×2 IMPLANT

## 2014-08-25 NOTE — Anesthesia Preprocedure Evaluation (Signed)
Anesthesia Evaluation  Patient identified by MRN, date of birth, ID band Patient awake    Reviewed: Allergy & Precautions, NPO status , Patient's Chart, lab work & pertinent test results  Airway Mallampati: II   Neck ROM: full    Dental   Pulmonary sleep apnea , Current Smoker,  breath sounds clear to auscultation        Cardiovascular negative cardio ROS  Rhythm:regular Rate:Normal     Neuro/Psych Anxiety Depression    GI/Hepatic   Endo/Other  diabetes, Type 2obese  Renal/GU      Musculoskeletal  (+) Arthritis -,   Abdominal   Peds  Hematology   Anesthesia Other Findings   Reproductive/Obstetrics                             Anesthesia Physical Anesthesia Plan  ASA: II  Anesthesia Plan: General and Regional   Post-op Pain Management: MAC Combined w/ Regional for Post-op pain   Induction: Intravenous  Airway Management Planned: LMA  Additional Equipment:   Intra-op Plan:   Post-operative Plan:   Informed Consent: I have reviewed the patients History and Physical, chart, labs and discussed the procedure including the risks, benefits and alternatives for the proposed anesthesia with the patient or authorized representative who has indicated his/her understanding and acceptance.     Plan Discussed with: CRNA, Anesthesiologist and Surgeon  Anesthesia Plan Comments:         Anesthesia Quick Evaluation

## 2014-08-25 NOTE — Anesthesia Procedure Notes (Addendum)
Anesthesia Regional Block:  Femoral nerve block  Pre-Anesthetic Checklist: ,, timeout performed, Correct Patient, Correct Site, Correct Laterality, Correct Procedure,, site marked, risks and benefits discussed, Surgical consent,  Pre-op evaluation,  At surgeon's request and post-op pain management  Laterality: Right  Prep: chloraprep       Needles:  Injection technique: Single-shot  Needle Type: Echogenic Stimulator Needle     Needle Length: 9cm 9 cm Needle Gauge: 21 and 21 G    Additional Needles:  Procedures: nerve stimulator Femoral nerve block  Nerve Stimulator or Paresthesia:  Response: Quadriceps muscle contraction, 0.45 mA,   Additional Responses:   Narrative:  Start time: 08/25/2014 10:46 AM End time: 08/25/2014 10:56 AM Injection made incrementally with aspirations every 5 mL.  Performed by: Personally  Anesthesiologist: HODIERNE, ADAM  Additional Notes: Functioning IV was confirmed and monitors were applied.  A 44mm 21ga Arrow echogenic stimulator needle was used. Sterile prep and drape,hand hygiene and sterile gloves were used.  Negative aspiration and negative test dose prior to incremental administration of local anesthetic. The patient tolerated the procedure well.     Procedure Name: Intubation Date/Time: 08/25/2014 11:38 AM Performed by: Merdis Delay Pre-anesthesia Checklist: Patient identified, Emergency Drugs available, Suction available, Patient being monitored and Timeout performed Patient Re-evaluated:Patient Re-evaluated prior to inductionOxygen Delivery Method: Circle system utilized Preoxygenation: Pre-oxygenation with 100% oxygen Intubation Type: IV induction Ventilation: Mask ventilation without difficulty and Oral airway inserted - appropriate to patient size Laryngoscope Size: Mac and 4 Grade View: Grade III Tube type: Oral Tube size: 7.5 mm Number of attempts: 2 Airway Equipment and Method: Video-laryngoscopy Placement Confirmation:  positive ETCO2,  CO2 detector,  breath sounds checked- equal and bilateral and ETT inserted through vocal cords under direct vision Secured at: 23 cm Tube secured with: Tape Dental Injury: Teeth and Oropharynx as per pre-operative assessment  Difficulty Due To: Difficulty was unanticipated Comments: Grade 3/4 view with MAC 4- anterior airway with steep angle. Glidescope utilized on second pass.

## 2014-08-25 NOTE — Transfer of Care (Signed)
Immediate Anesthesia Transfer of Care Note  Patient: Brandon Miller  Procedure(s) Performed: Procedure(s): TOTAL KNEE ARTHROPLASTY (Right)  Patient Location: PACU  Anesthesia Type:General  Level of Consciousness: awake, alert  and oriented  Airway & Oxygen Therapy: Patient Spontanous Breathing and Patient connected to nasal cannula oxygen  Post-op Assessment: Report given to RN and Post -op Vital signs reviewed and stable  Post vital signs: Reviewed and stable  Last Vitals:  Filed Vitals:   08/25/14 1319  BP:   Pulse:   Temp: 36.6 C  Resp:     Complications: No apparent anesthesia complications

## 2014-08-25 NOTE — Progress Notes (Signed)
Orthopedic Tech Progress Note Patient Details:  Brandon Miller 03-Apr-1965 [id number]  CPM Right Knee CPM Right Knee: On Right Knee Flexion (Degrees): 90 Right Knee Extension (Degrees): 0 Additional Comments: trapeze bar patient helper Viewed order from doctor's order list  [name] 08/25/2014, 2:05 PM

## 2014-08-25 NOTE — Procedures (Signed)
Pt refused Cpap. RT told patient to inform respiratory if he changes his mind.

## 2014-08-25 NOTE — Plan of Care (Signed)
Problem: Consults Goal: Diagnosis- Total Joint Replacement Outcome: Completed/Met Date Met:  08/25/14 Primary Total Knee right

## 2014-08-25 NOTE — Interval H&P Note (Signed)
History and Physical Interval Note:  08/25/2014 8:26 AM  Brandon Miller  has presented today for surgery, with the diagnosis of DJD RIGHT KNEE  The various methods of treatment have been discussed with the patient and family. After consideration of risks, benefits and other options for treatment, the patient has consented to  Procedure(s): TOTAL KNEE ARTHROPLASTY (Right) as a surgical intervention .  The patient's history has been reviewed, patient examined, no change in status, stable for surgery.  I have reviewed the patient's chart and labs.  Questions were answered to the patient's satisfaction.     Ninetta Lights

## 2014-08-25 NOTE — Discharge Summary (Signed)
Patient ID: Brandon Miller MRN: [id number] DOB/AGE: [birth year] 49 y.o.  Admit date: 08/25/2014 Discharge date: 08/26/2014  Admission Diagnoses:  Active Problems:   DJD (degenerative joint disease) of knee   Discharge Diagnoses:  Same  Past Medical History  Diagnosis Date  . Allergy   . Anemia   . Depression   . Diabetes mellitus without complication   . Anxiety   . Snores   . Complication of anesthesia     hard to wake up-was told he may have sleep apnea-never tested  . Sleep apnea   . Arthritis     RA    Surgeries: Procedure(s): TOTAL KNEE ARTHROPLASTY on 08/25/2014   Consultants:    Discharged Condition: Improved  Hospital Course: Brandon Miller is an 49 y.o. male who was admitted 08/25/2014 for operative treatment of primary localized osteoarthritis right knee. Patient has severe unremitting pain that affects sleep, daily activities, and work/hobbies. After pre-op clearance the patient was taken to the operating room on 08/25/2014 and underwent  Procedure(s): TOTAL KNEE ARTHROPLASTY.  Patient with a pre-op Hb of 13.8 developed ABLA on pod #1 with a Hb of 11.1.  Gurvir is currently stable but we will continue to follow.  Patient was given perioperative antibiotics:      Anti-infectives    Start     Dose/Rate Route Frequency Ordered Stop   08/25/14 1715  ceFAZolin (ANCEF) IVPB 2 g/50 mL premix     2 g 100 mL/hr over 30 Minutes Intravenous Every 6 hours 08/25/14 1638 08/25/14 2336   08/25/14 0937  ceFAZolin (ANCEF) 2-3 GM-% IVPB SOLR    Comments:  Nyoka Cowden   : cabinet override      08/25/14 7062 08/25/14 2144   08/25/14 0930  ceFAZolin (ANCEF) IVPB 2 g/50 mL premix     2 g 100 mL/hr over 30 Minutes Intravenous On call to O.R. 08/25/14 0930 08/25/14 1126       Patient was given sequential compression devices, early ambulation, and chemoprophylaxis to prevent DVT.  Patient benefited maximally from hospital stay and there were no complications.    Recent vital  signs:  Patient Vitals for the past 24 hrs:  BP Temp Temp src Pulse Resp SpO2 Height Weight  08/26/14 0126 (!) 120/57 mmHg 98.4 F (36.9 C) - 68 16 93 % - -  08/25/14 2101 (!) 102/48 mmHg 98 F (36.7 C) - (!) 55 17 96 % - -  08/25/14 1708 (!) 102/50 mmHg 98.1 F (36.7 C) - 75 18 97 % - -  08/25/14 1635 (!) 123/99 mmHg 98 F (36.7 C) Oral 74 16 96 % - -  08/25/14 1600 (!) 109/40 mmHg 98.2 F (36.8 C) - 70 17 100 % - -  08/25/14 1545 (!) 98/40 mmHg - - 77 12 100 % - -  08/25/14 1530 (!) 93/42 mmHg - - 60 11 98 % - -  08/25/14 1515 (!) 96/52 mmHg - - 65 (!) 9 100 % - -  08/25/14 1500 94/63 mmHg - - 72 19 100 % - -  08/25/14 1445 (!) 81/59 mmHg - - 64 14 100 % - -  08/25/14 1430 (!) 96/35 mmHg - - 62 15 98 % - -  08/25/14 1415 (!) 94/43 mmHg - - (!) 55 (!) 8 (!) 89 % - -  08/25/14 1400 115/78 mmHg - - 62 14 100 % - -  08/25/14 1345 - - - 68 18 98 % - -  08/25/14 1340 112/64 mmHg - -  67 11 98 % - -  08/25/14 1334 - - - 68 16 98 % - -  08/25/14 1322 (!) 171/140 mmHg - - 75 19 97 % - -  08/25/14 1319 - 97.9 F (36.6 C) - 73 19 (!) 88 % - -  08/25/14 1055 (!) 114/35 mmHg - - 61 18 100 % - -  08/25/14 1050 - - - 64 (!) 23 95 % - -  08/25/14 1045 - - - (!) 56 13 93 % - -  08/25/14 1035 - - - 63 (!) 9 95 % - -  08/25/14 1025 - - - 69 16 95 % - -  08/25/14 0939 123/69 mmHg 98.8 F (37.1 C) Oral 69 18 98 % _0  (1.854 m) 118.888 kg (262 lb 1.6 oz)     Recent laboratory studies:   Recent Labs  08/26/14 0413  WBC 7.5  HGB 11.1*  HCT 33.5*  PLT 150     Discharge Medications:     Medication List    STOP taking these medications        oxycodone 30 MG immediate release tablet  Commonly known as:  ROXICODONE     OXYCONTIN 80 mg T12a 12 hr tablet  Generic drug:  OxyCODONE      TAKE these medications        allopurinol 300 MG tablet  Commonly known as:  ZYLOPRIM  Take 300 mg by mouth daily.     ALPRAZolam 1 MG tablet  Commonly known as:  XANAX  Take 1 mg by mouth at  bedtime as needed. For anxiety     amphetamine-dextroamphetamine 25 MG 24 hr capsule  Commonly known as:  ADDERALL XR  Take 25 mg by mouth every morning.     apixaban 2.5 MG Tabs tablet  Commonly known as:  ELIQUIS  Take 1 tab po q12 hours x 14 days following surgery to prevent blood clots     bisacodyl 5 MG EC tablet  Commonly known as:  DULCOLAX  Take 1 tablet (5 mg total) by mouth daily as needed for moderate constipation.     bumetanide 0.5 MG tablet  Commonly known as:  BUMEX  Take 0.5 mg by mouth daily.     cyclobenzaprine 10 MG tablet  Commonly known as:  FLEXERIL  Take 10 mg by mouth 3 (three) times daily as needed. For muscle spasms     DULoxetine 60 MG capsule  Commonly known as:  CYMBALTA  Take 60 mg by mouth 2 (two) times daily.     fexofenadine 180 MG tablet  Commonly known as:  ALLEGRA  Take 180 mg by mouth daily.     folic acid 1 MG tablet  Commonly known as:  FOLVITE  Take 1 mg by mouth daily.     glipiZIDE 5 MG 24 hr tablet  Commonly known as:  GLUCOTROL XL  Take 5 mg by mouth daily with breakfast.     HUMIRA 20 MG/0.4ML Kit  Generic drug:  Adalimumab  Inject into the skin. Every 2 weeks     HYDROmorphone 4 MG tablet  Commonly known as:  DILAUDID  Take 1-2 tabs po q4-6 hours prn pain     metFORMIN 500 MG 24 hr tablet  Commonly known as:  GLUCOPHAGE-XR  TAKE 1 TABLET BY MOUTH EVERY DAY WITH EVENING MEAL     methotrexate 2.5 MG tablet  Commonly known as:  RHEUMATREX  Take 20 mg by mouth once a week. Sunday  mirtazapine 30 MG tablet  Commonly known as:  REMERON  Take 30 mg by mouth at bedtime.     ondansetron 4 MG tablet  Commonly known as:  ZOFRAN  Take 1 tablet (4 mg total) by mouth every 8 (eight) hours as needed for nausea or vomiting.        Diagnostic Studies: Dg Knee Right Port  08/25/2014   CLINICAL DATA:  Right knee replacement  EXAM: PORTABLE RIGHT KNEE - 1-2 VIEW  COMPARISON:  MRI 09/07/2011  FINDINGS: Changes of right knee  replacement. Soft tissue drain in place. Soft tissue and joint space gas present. No hardware or bony complicating feature.  IMPRESSION: Right knee replacement without complicating feature   Electronically Signed   By: Rolm Baptise M.D.   On: 08/25/2014 13:51    Disposition: 01-Home or Self Care    Follow-up Information    Follow up with Ninetta Lights, MD. Schedule an appointment as soon as possible for a visit in 2 weeks.   Specialty:  Orthopedic Surgery   Contact information:   [address] [place] [address] [phone]        Signed: Fannie Knee 08/26/2014, 5:57 AM

## 2014-08-25 NOTE — H&P (View-Only) (Signed)
TOTAL KNEE ADMISSION H&P  Patient is being admitted for right total knee arthroplasty.  Subjective:  Chief Complaint:right knee pain.  HPI: Brandon Miller, 49 y.o. male, has a history of pain and functional disability in the right knee due to arthritis and has failed non-surgical conservative treatments for greater than 12 weeks to includeNSAID's and/or analgesics, corticosteriod injections, viscosupplementation injections and activity modification.  Onset of symptoms was gradual, starting 8 years ago with gradually worsening course since that time. The patient noted prior procedures on the knee to include  arthroscopy and menisectomy on the right knee(s).  Patient currently rates pain in the right knee(s) at 9 out of 10 with activity. Patient has night pain, worsening of pain with activity and weight bearing and crepitus.  Patient has evidence of subchondral sclerosis and joint space narrowing by imaging studies. There is no active infection.  Patient Active Problem List   Diagnosis Date Noted  . Diabetes mellitus, type 2 10/31/2011  . Hyperglycemia 10/31/2011  . PNA (pneumonia) 10/30/2011  . LEG PAIN, RIGHT 01/20/2010   Past Medical History  Diagnosis Date  . Allergy   . Anemia   . Depression   . Diabetes mellitus without complication   . Anxiety   . Snores   . Complication of anesthesia     hard to wake up-was told he may have sleep apnea-never tested    Past Surgical History  Procedure Laterality Date  . Knee surgery Right 2011  . Hernia repair    . Elbow surgery Right 2010    Dr. Percell Miller  . Shoulder surgery Right 2010    Dr. Percell Miller  . Back surgery      no surgery  . Knee arthroscopy with medial menisectomy Right 06/11/2013    Procedure: RIGHT KNEE ARTHROSCOPY WITH MEDIAL MENISECTOMY AND CHONDROPLASTY;  Surgeon: Ninetta Lights, MD;  Location: [facility];  Service: Orthopedics;  Laterality: Right;  Chondroplasty, loose bodies, medial menisectomy, lysis of  adhesions.     (Not in a hospital admission) Allergies  Allergen Reactions  . Indocin [Indomethacin] Nausea And Vomiting  . Zanaflex [Tizanidine Hcl] Swelling    History  Substance Use Topics  . Smoking status: Current Every Day Smoker -- 0.50 packs/day for 25 years  . Smokeless tobacco: Not on file  . Alcohol Use: No    No family history on file.   Review of Systems  Constitutional: Negative.   HENT: Negative.   Eyes: Negative.   Respiratory: Negative.   Cardiovascular: Negative.   Gastrointestinal: Negative.   Genitourinary: Negative.   Musculoskeletal: Positive for back pain and joint pain.  Skin: Negative.   Neurological: Negative.   Endo/Heme/Allergies: Negative.   Psychiatric/Behavioral: Negative.     Objective:  Physical Exam  Constitutional: He is oriented to person, place, and time. He appears well-developed and well-nourished.  HENT:  Head: Normocephalic and atraumatic.  Eyes: EOM are normal. Pupils are equal, round, and reactive to light.  Neck: Normal range of motion. Neck supple.  Cardiovascular: Normal rate and regular rhythm.  Exam reveals no friction rub.   No murmur heard. Respiratory: Effort normal and breath sounds normal. No respiratory distress. He has no wheezes. He has no rales.  GI: Soft. Bowel sounds are normal.  Musculoskeletal:  Examination of his right knee reveals range of motion 0-120 degrees.  He is stable to valgus and varus stress.  Examination of his left knee reveals range of motion from 0-120 degrees.  Marked patellofemoral crepitus.  Medial joint line tenderness.  Nothing laterally.    Neurological: He is alert and oriented to person, place, and time.  Skin: Skin is warm and dry.  Psychiatric: He has a normal mood and affect. His behavior is normal. Judgment and thought content normal.    Vital signs in last 24 hours: @VSRANGES @  Labs:   Estimated body mass index is 33.93 kg/(m^2) as calculated from the following:   Height  as of 06/11/13: 6\' 2"  (1.88 m).   Weight as of 06/11/13: 119.92 kg (264 lb 6 oz).   Imaging Review Plain radiographs demonstrate severe degenerative joint disease of the right knee(s). The overall alignment isneutral. The bone quality appears to be fair for age and reported activity level.  Assessment/Plan:  End stage arthritis, right knee   The patient history, physical examination, clinical judgment of the provider and imaging studies are consistent with end stage degenerative joint disease of the right knee(s) and total knee arthroplasty is deemed medically necessary. The treatment options including medical management, injection therapy arthroscopy and arthroplasty were discussed at length. The risks and benefits of total knee arthroplasty were presented and reviewed. The risks due to aseptic loosening, infection, stiffness, patella tracking problems, thromboembolic complications and other imponderables were discussed. The patient acknowledged the explanation, agreed to proceed with the plan and consent was signed. Patient is being admitted for inpatient treatment for surgery, pain control, PT, OT, prophylactic antibiotics, VTE prophylaxis, progressive ambulation and ADL's and discharge planning. The patient is planning to be discharged home with home health services

## 2014-08-26 ENCOUNTER — Encounter (HOSPITAL_COMMUNITY): Payer: Self-pay | Admitting: Orthopedic Surgery

## 2014-08-26 LAB — GLUCOSE, CAPILLARY
Glucose-Capillary: 115 mg/dL — ABNORMAL HIGH (ref 65–99)
Glucose-Capillary: 114 mg/dL — ABNORMAL HIGH (ref 65–99)
Glucose-Capillary: 120 mg/dL — ABNORMAL HIGH (ref 65–99)

## 2014-08-26 LAB — CBC
HEMATOCRIT: 33.5 % — AB (ref 39.0–52.0)
Hemoglobin: 11.1 g/dL — ABNORMAL LOW (ref 13.0–17.0)
MCH: 28.7 pg (ref 26.0–34.0)
MCHC: 33.1 g/dL (ref 30.0–36.0)
MCV: 86.6 fL (ref 78.0–100.0)
PLATELETS: 150 10*3/uL (ref 150–400)
RBC: 3.87 MIL/uL — AB (ref 4.22–5.81)
RDW: 13.8 % (ref 11.5–15.5)
WBC: 7.5 10*3/uL (ref 4.0–10.5)

## 2014-08-26 LAB — BASIC METABOLIC PANEL
ANION GAP: 4 — AB (ref 5–15)
BUN: 8 mg/dL (ref 6–20)
CO2: 27 mmol/L (ref 22–32)
Calcium: 8.1 mg/dL — ABNORMAL LOW (ref 8.9–10.3)
Chloride: 106 mmol/L (ref 101–111)
Creatinine, Ser: 0.84 mg/dL (ref 0.61–1.24)
GFR calc Af Amer: >60 mL/min (ref >60)
GLUCOSE: 98 mg/dL (ref 65–99)
Potassium: 4.3 mmol/L (ref 3.5–5.1)
Sodium: 137 mmol/L (ref 135–145)

## 2014-08-26 LAB — HEMOGLOBIN A1C
HEMOGLOBIN A1C: 6.2 % — AB (ref 4.8–5.6)
MEAN PLASMA GLUCOSE: 131 mg/dL

## 2014-08-26 NOTE — Care Management Note (Signed)
Case Management Note  Patient Details  Name: Brandon Miller MRN: [id number] Date of Birth: 1965/08/27  Subjective/Objective:                 S/p right total knee arthroplasty   Action/Plan: Set up for HHPT with [facility] by MD office. Spoke with patient, no change in discharge plan. [employer] has delivered CPM, rolling walker and 3N1 to patient's home. Patient states that his sister will be assisting him after discharge.    Expected Discharge Date:                  Expected Discharge Plan:  [place]  In-House Referral:  NA  Discharge planning Services  CM Consult  Post Acute Care Choice:  Durable Medical Equipment, Home Health Choice offered to:  Patient  DME Arranged:  3-N-1, CPM, Walker rolling DME Agency:  [employer]  HH Arranged:  PT [place]:  [place]  Status of Service:  Completed, signed off  Medicare Important Message Given:    Date Medicare IM Given:    Medicare IM give by:    Date Additional Medicare IM Given:    Additional Medicare Important Message give by:     If discussed at [place] of Stay Meetings, dates discussed:    Additional Comments:  Nila Nephew, RN 08/26/2014, 12:11 PM

## 2014-08-26 NOTE — Discharge Instructions (Signed)
INSTRUCTIONS AFTER JOINT REPLACEMENT   o Remove items at home which could result in a fall. This includes throw rugs or furniture in walking pathways o ICE to the affected joint every three hours while awake for 30 minutes at a time, for at least the first 3-5 days, and then as needed for pain and swelling.  Continue to use ice for pain and swelling. You may notice swelling that will progress down to the foot and ankle.  This is normal after surgery.  Elevate your leg when you are not up walking on it.   o Continue to use the breathing machine you got in the hospital (incentive spirometer) which will help keep your temperature down.  It is common for your temperature to cycle up and down following surgery, especially at night when you are not up moving around and exerting yourself.  The breathing machine keeps your lungs expanded and your temperature down.  BLOOD THINNER: TAKE EILIQUIS AS INSTRUCTED FOR A TOTAL OF 14 DAYS FOLLOWING SURGERY TO PREVENT BLOOD CLOTS.  ONCE FINISHED WITH THIS, TAKE ASPIRIN 325 MG ONE TAB DAILY FOR THE NEXT 14 DAYS.  THIS IS ALSO TO PREVENT BLOOD CLOTS.  DIET:  As you were doing prior to hospitalization, we recommend a well-balanced diet.  DRESSING / WOUND CARE / SHOWERING  You may change your dressing 3-5 days after surgery.  Then change the dressing every day with sterile gauze.  Please use good hand washing techniques before changing the dressing.  Do not use any lotions or creams on the incision until instructed by your surgeon. and You may shower 3 days after surgery, but keep the wounds dry during showering.  You may use an occlusive plastic wrap (Press'n Seal for example), NO SOAKING/SUBMERGING IN THE BATHTUB.  If the bandage gets wet, change with a clean dry gauze.  If the incision gets wet, pat the wound dry with a clean towel.  ACTIVITY  o Increase activity slowly as tolerated, but follow the weight bearing instructions below.   o No driving for 6 weeks or until  further direction given by your physician.  You cannot drive while taking narcotics.  o No lifting or carrying greater than 10 lbs. until further directed by your surgeon. o Avoid periods of inactivity such as sitting longer than an hour when not asleep. This helps prevent blood clots.  o You may return to work once you are authorized by your doctor.     WEIGHT BEARING   Weight bearing as tolerated with assist device (walker, cane, etc) as directed, use it as long as suggested by your surgeon or therapist, typically at least 4-6 weeks.   EXERCISES  Results after joint replacement surgery are often greatly improved when you follow the exercise, range of motion and muscle strengthening exercises prescribed by your doctor. Safety measures are also important to protect the joint from further injury. Any time any of these exercises cause you to have increased pain or swelling, decrease what you are doing until you are comfortable again and then slowly increase them. If you have problems or questions, call your caregiver or physical therapist for advice.   Rehabilitation is important following a joint replacement. After just a few days of immobilization, the muscles of the leg can become weakened and shrink (atrophy).  These exercises are designed to build up the tone and strength of the thigh and leg muscles and to improve motion. Often times heat used for twenty to thirty minutes before  working out will loosen up your tissues and help with improving the range of motion but do not use heat for the first two weeks following surgery (sometimes heat can increase post-operative swelling).   These exercises can be done on a training (exercise) mat, on the floor, on a table or on a bed. Use whatever works the best and is most comfortable for you.    Use music or television while you are exercising so that the exercises are a pleasant break in your day. This will make your life better with the exercises acting  as a break in your routine that you can look forward to.   Perform all exercises about fifteen times, three times per day or as directed.  You should exercise both the operative leg and the other leg as well.  Exercises include:    Quad Sets - Tighten up the muscle on the front of the thigh (Quad) and hold for 5-10 seconds.    Straight Leg Raises - With your knee straight (if you were given a brace, keep it on), lift the leg to 60 degrees, hold for 3 seconds, and slowly lower the leg.  Perform this exercise against resistance later as your leg gets stronger.   Leg Slides: Lying on your back, slowly slide your foot toward your buttocks, bending your knee up off the floor (only go as far as is comfortable). Then slowly slide your foot back down until your leg is flat on the floor again.   Angel Wings: Lying on your back spread your legs to the side as far apart as you can without causing discomfort.   Hamstring Strength:  Lying on your back, push your heel against the floor with your leg straight by tightening up the muscles of your buttocks.  Repeat, but this time bend your knee to a comfortable angle, and push your heel against the floor.  You may put a pillow under the heel to make it more comfortable if necessary.   A rehabilitation program following joint replacement surgery can speed recovery and prevent re-injury in the future due to weakened muscles. Contact your doctor or a physical therapist for more information on knee rehabilitation.    CONSTIPATION  Constipation is defined medically as fewer than three stools per week and severe constipation as less than one stool per week.  Even if you have a regular bowel pattern at home, your normal regimen is likely to be disrupted due to multiple reasons following surgery.  Combination of anesthesia, postoperative narcotics, change in appetite and fluid intake all can affect your bowels.   YOU MUST use at least one of the following options; they  are listed in order of increasing strength to get the job done.  They are all available over the counter, and you may need to use some, POSSIBLY even all of these options:    Drink plenty of fluids (prune juice may be helpful) and high fiber foods Colace 100 mg by mouth twice a day  Senokot for constipation as directed and as needed Dulcolax (bisacodyl), take with full glass of water  Miralax (polyethylene glycol) once or twice a day as needed.  If you have tried all these things and are unable to have a bowel movement in the first 3-4 days after surgery call either your surgeon or your primary doctor.    If you experience loose stools or diarrhea, hold the medications until you stool forms back up.  If your symptoms do not  get better within 1 week or if they get worse, check with your doctor.  If you experience "the worst abdominal pain ever" or develop nausea or vomiting, please contact the office immediately for further recommendations for treatment.   ITCHING:  If you experience itching with your medications, try taking only a single pain pill, or even half a pain pill at a time.  You can also use Benadryl over the counter for itching or also to help with sleep.   TED HOSE STOCKINGS:  Use stockings on both legs until for at least 2 weeks or as directed by physician office. They may be removed at night for sleeping.  MEDICATIONS:  See your medication summary on the After Visit Summary that nursing will review with you.  You may have some home medications which will be placed on hold until you complete the course of blood thinner medication.  It is important for you to complete the blood thinner medication as prescribed.  PRECAUTIONS:  If you experience chest pain or shortness of breath - call 911 immediately for transfer to the hospital emergency department.   If you develop a fever greater that 101 F, purulent drainage from wound, increased redness or drainage from wound, foul odor from the  wound/dressing, or calf pain - CONTACT YOUR SURGEON.                                                   FOLLOW-UP APPOINTMENTS:  If you do not already have a post-op appointment, please call the office for an appointment to be seen by your surgeon.  Guidelines for how soon to be seen are listed in your After Visit Summary, but are typically between 1-4 weeks after surgery.  OTHER INSTRUCTIONS:   Knee Replacement:  Do not place pillow under knee, focus on keeping the knee straight while resting. CPM instructions: 0-90 degrees, 2 hours in the morning, 2 hours in the afternoon, and 2 hours in the evening. Place foam block, curve side up under heel at all times except when in CPM or when walking.  DO NOT modify, tear, cut, or change the foam block in any way.  MAKE SURE YOU:   Understand these instructions.   Get help right away if you are not doing well or get worse.    Thank you for letting us be a part of your medical care team.  It is a privilege we respect greatly.  We hope these instructions will help you stay on track for a fast and full recovery!   Information on my medicine - ELIQUIS (apixaban)  This medication education was reviewed with me or my healthcare representative as part of my discharge preparation.  The pharmacist that spoke with me during my hospital stay was:  Tad Moore, [facility]  Why was Eliquis prescribed for you? Eliquis was prescribed for you to reduce the risk of forming blood clots that can cause a stroke if you have a medical condition called atrial fibrillation (a type of irregular heartbeat) OR to reduce the risk of a blood clots forming after orthopedic surgery.  What do You need to know about Eliquis ? Take your Eliquis TWICE DAILY - one tablet in the morning and one tablet in the evening with or without food.  It would be best to take the doses about the same  time each day.  If you have difficulty swallowing the tablet whole please discuss with your  pharmacist how to take the medication safely.  Take Eliquis exactly as prescribed by your doctor and DO NOT stop taking Eliquis without talking to the doctor who prescribed the medication.  Stopping may increase your risk of developing a new clot or stroke.  Refill your prescription before you run out.  After discharge, you should have regular check-up appointments with your healthcare provider that is prescribing your Eliquis.  In the future your dose may need to be changed if your kidney function or weight changes by a significant amount or as you get older.  What do you do if you miss a dose? If you miss a dose, take it as soon as you remember on the same day and resume taking twice daily.  Do not take more than one dose of ELIQUIS at the same time.  Important Safety Information A possible side effect of Eliquis is bleeding. You should call your healthcare provider right away if you experience any of the following: ? Bleeding from an injury or your nose that does not stop. ? Unusual colored urine (red or dark brown) or unusual colored stools (red or black). ? Unusual bruising for unknown reasons. ? A serious fall or if you hit your head (even if there is no bleeding).  Some medicines may interact with Eliquis and might increase your risk of bleeding or clotting while on Eliquis. To help avoid this, consult your healthcare provider or pharmacist prior to using any new prescription or non-prescription medications, including herbals, vitamins, non-steroidal anti-inflammatory drugs (NSAIDs) and supplements.  This website has more information on Eliquis (apixaban): [email or web address].no.

## 2014-08-26 NOTE — Progress Notes (Signed)
D/C teaching complete, Rx's for anticoagulant. NV, constipation and pain reviewed. Reinforced to patient to only take Dilaudid PO or his Percocet Rx he has at home - to take separately and not to take/ combine at the same time per instructions Karenann Cai, PA and Dr. Maryla Morrow. Denied questions. Gave copy of d/c instructions along with his Rx's. Tech with take to front [place] tower exit to meet his sister for drive home.

## 2014-08-26 NOTE — Progress Notes (Signed)
Patient's wife came out into the hallway outside of his room, requested I call Dr. Percell Miller stating he is getting restless and agitated, that Dr. Percell Miller is aware he takes more pain medicine at home than what he is getting here. Telephone call to Tawanna Cooler, PA, informed her of wife's concern. She state Dr. Percell Miller is aware and to instruct the patient he may Rx for pain management for d/c is Dilaudid 4 mg, to take 1-2 PO per instructions or he may take the Percocet, not both. Stated Dr. Percell Miller stated he could leave to day if patient decides or tomorrow. This RN verbally informed both patient and wife of pain management instructions and d/c information.

## 2014-08-26 NOTE — Progress Notes (Signed)
Physical Therapy Treatment Patient Details Name: Brandon Miller MRN: [id number] DOB: March 25, 1965 Today's Date: 08/26/2014    History of Present Illness Brandon Miller, 49 y.o. male, has a history of pain and functional disability in the right knee due to arthritis. Pt is s/p R total knee arthroplasty.    PT Comments    Able to increase ambulation to 125 feet and up/down 2 steps. Reviewed HEP and provided with copy. Anticipate patient will be ready for D/C to home with family assistance when released.   Follow Up Recommendations  Home health PT     Equipment Recommendations  Other (comment)    Recommendations for Other Services       Precautions / Restrictions Precautions Precautions: Knee Precaution Booklet Issued: Yes (comment) Precaution Comments: HEP  Restrictions Weight Bearing Restrictions: Yes RLE Weight Bearing: Weight bearing as tolerated    Mobility  Bed Mobility                  Transfers Overall transfer level: Needs assistance Equipment used: Rolling walker (2 wheeled) Transfers: Sit to/from Stand Sit to Stand: Supervision            Ambulation/Gait Ambulation/Gait assistance: Supervision Ambulation Distance (Feet): 125 Feet Assistive device: Rolling walker (2 wheeled) Gait Pattern/deviations: Step-through pattern;Decreased weight shift to right;Decreased step length - right Gait velocity: decreased       Stairs Stairs: Yes Stairs assistance: Min guard Stair Management: With crutches Number of Stairs: 2 General stair comments: safe and steady pattern demonstrated  Wheelchair Mobility    Modified Rankin (Stroke Patients Only)       Balance Overall balance assessment: Needs assistance Sitting-balance support: No upper extremity supported Sitting balance-Leahy Scale: Good     Standing balance support: No upper extremity supported Standing balance-Leahy Scale: Fair                      Cognition Arousal/Alertness:  Awake/alert Behavior During Therapy: WFL for tasks assessed/performed Overall Cognitive Status: Within Functional Limits for tasks assessed                      Exercises Total Joint Exercises Ankle Circles/Pumps: AROM;Both;10 reps Quad Sets: Strengthening;Right;10 reps Heel Slides: AAROM;Right;10 reps Hip ABduction/ADduction: Right;5 reps;Supine;Strengthening    General Comments        Pertinent Vitals/Pain Pain Assessment: Faces Faces Pain Scale: Hurts little more Pain Location: Rt knee Pain Descriptors / Indicators: Guarding;Grimacing Pain Intervention(s): Monitored during session    Home Living                      Prior Function            PT Goals (current goals can now be found in the care plan section) Acute Rehab PT Goals Patient Stated Goal: to go home PT Goal Formulation: With patient Time For Goal Achievement: 09/09/14 Potential to Achieve Goals: Good Progress towards PT goals: Progressing toward goals    Frequency  7X/week    PT Plan Current plan remains appropriate    Co-evaluation             End of Session Equipment Utilized During Treatment: Gait belt Activity Tolerance: Patient tolerated treatment well Patient left: in bed;in CPM;with call bell/phone within reach     Time: 8315-1761 PT Time Calculation (min) (ACUTE ONLY): 28 min  Charges:  $Gait Training: 8-22 mins $Therapeutic Exercise: 8-22 mins  G Codes:      Cassell Clement, PT, CSCS Pager [phone] Office [phone]  08/26/2014, 4:18 PM

## 2014-08-26 NOTE — Op Note (Signed)
Brandon Miller, [name]                ACCOUNT NO.:  [id number]  MEDICAL RECORD NO.:  [id number]  LOCATION:  5N28C                        FACILITY:  [place]  PHYSICIAN:  Ninetta Lights, M.D. DATE OF BIRTH:  Jul 23, 1965  DATE OF PROCEDURE:  08/25/2014 DATE OF DISCHARGE:                              OPERATIVE REPORT   PREOPERATIVE DIAGNOSIS:  Right knee end-stage arthritis, primary, localized.  POSTOPERATIVE DIAGNOSES:  Right knee end-stage arthritis, primary, localized.  PROCEDURE:  Right knee modified minimally-invasive total knee replacement with Stryker Triathlon prosthesis.  Soft tissue balancing. Cemented pegged posterior stabilized #8 femoral component.  Cemented #8 tibial component, 9-mm PS insert.  Cemented resurfacing 40-mm patellar component.  SURGEON:  Ninetta Lights, M.D.  ASSISTANT:  Tawanna Cooler, PA, present throughout the entire case and necessary for timely completion of procedure.  ANESTHESIA:  General.  BLOOD LOSS:  Minimal.  SPECIMENS:  None.  CULTURES:  None.  COMPLICATIONS:  None.  DRESSINGS:  Soft compressive knee immobilizer.  DRAINS:  Hemovac x1.  TOURNIQUET TIME:  50 minutes.  DESCRIPTION OF PROCEDURE:  The patient was brought to the operating room and placed on the operating table in supine position.  After adequate anesthesia had been obtained, tourniquet applied, prepped and draped in usual sterile fashion.  Exsanguinated with elevation of Esmarch. Tourniquet was inflated to 350 mmHg.  Straight incision above the patella down to tibial tubercle.  Medial arthrotomy, vastus splitting, preserving quad tendon.  Intramedullary guide, distal femur, 8-mm resection, 5 degrees of valgus.  Using epicondylar axis, the femur was sized, cut, and fitted for posterior stabilized pegged #8 component. Proximal tibial resection with extramedullary guide, 3-degree posterior slope cut.  Size #3 component.  Patella exposed.  Posterior 11-mm was removed.   Drilled, sized, and fitted for a 40-mm component.  Trial was put in place.  A 9-mm tibial insert.  With this construct, nicely balanced knee.  Full motion and good biomechanical axis, good patellar tracking confirmed.  Tibia was marked for rotation and hand reamed.  All trials were removed.  Copious irrigation with a pulse irrigating device. Cement prepared, placed on all components, firmly seated.  Polyethylene attached to tibia, knee reduced.  Patella was held with clamp.  Once the cement hardened, the knee was irrigated again.  Soft tissue was injected with Exparel.  Hemovac was placed.  Arthrotomy was closed with #1 Ethibond.  Subcutaneous and subcuticular closure.  Sterile compressive dressing applied.  Tourniquet was deflated and removed.  Knee immobilizer applied.  Anesthesia reversed.  Brought to the recovery room.  Tolerated the surgery well.  No complications.     Ninetta Lights, M.D.     DFM/MEDQ  D:  08/25/2014  T:  08/26/2014  Job:  [id number]

## 2014-08-26 NOTE — Evaluation (Signed)
Physical Therapy Evaluation Patient Details Name: Brandon Miller MRN: [id number] DOB: [birth year] Today's Date: 08/26/2014   History of Present Illness  Brandon Miller, 49 y.o. male, has a history of pain and functional disability in the right knee due to arthritis. Pt is s/p R total knee arthroplasty.  Clinical Impression  Pt is s/p TKA resulting in the deficits listed below (see PT Problem List). Pt will benefit from skilled PT to increase their independence and safety with mobility to allow discharge to the venue listed below. Patient reporting having trouble with pain control during and after session.      Follow Up Recommendations Home health PT    Equipment Recommendations  Other (comment) (states having rw)    Recommendations for Other Services       Precautions / Restrictions Precautions Precautions: Knee Restrictions Weight Bearing Restrictions: Yes RLE Weight Bearing: Weight bearing as tolerated      Mobility  Bed Mobility Overal bed mobility: Needs Assistance Bed Mobility: Supine to Sit     Supine to sit: Supervision        Transfers Overall transfer level: Needs assistance Equipment used: Rolling walker (2 wheeled) Transfers: Sit to/from Stand Sit to Stand: Min guard Stand pivot transfers: Min assist       General transfer comment: Pt received in recliner chair this session with sit <> stand with Min A and arm rest with RW. Pt ambulated 10' into bathroom for toilet transfer with stand pivot transfer onto elevated toilet seat. Pt required min verbal cues for hand placement and safety.  Ambulation/Gait Ambulation/Gait assistance: Min guard Ambulation Distance (Feet): 60 Feet Assistive device: Rolling walker (2 wheeled) Gait Pattern/deviations: Step-to pattern;Decreased step length - right;Decreased weight shift to right Gait velocity: decreased      Stairs            Wheelchair Mobility    Modified Rankin (Stroke Patients Only)        Balance Overall balance assessment: Needs assistance Sitting-balance support: No upper extremity supported Sitting balance-Leahy Scale: Good     Standing balance support: Bilateral upper extremity supported Standing balance-Leahy Scale: Poor Standing balance comment: using rw                             Pertinent Vitals/Pain Pain Assessment: 0-10 Pain Score: 10-Worst pain ever Pain Location: Rt knee Pain Descriptors / Indicators: Aching Pain Intervention(s): Monitored during session;Limited activity within patient's tolerance    Home Living Family/patient expects to be discharged to:: Private residence Living Arrangements: Alone;Other relatives Available Help at Discharge: Family Type of Home: House Home Access: Stairs to enter Entrance Stairs-Rails: None (porch post ) Technical brewer of Steps: 1 Home Layout: One level Home Equipment: Environmental consultant - 2 wheels      Prior Function Level of Independence: Independent               Hand Dominance   Dominant Hand: Right    Extremity/Trunk Assessment   Upper Extremity Assessment: Overall WFL for tasks assessed           Lower Extremity Assessment: RLE deficits/detail RLE Deficits / Details: unable to perform SLR       Communication   Communication: No difficulties  Cognition Arousal/Alertness: Awake/alert Behavior During Therapy: WFL for tasks assessed/performed Overall Cognitive Status: Within Functional Limits for tasks assessed  General Comments      Exercises Total Joint Exercises Ankle Circles/Pumps: AROM;Both;10 reps Quad Sets: Strengthening;Right;10 reps Heel Slides: AAROM;Right;10 reps      Assessment/Plan    PT Assessment Patient needs continued PT services  PT Diagnosis Difficulty walking   PT Problem List Decreased strength;Decreased range of motion;Decreased activity tolerance;Decreased balance;Decreased mobility  PT Treatment  Interventions DME instruction;Gait training;Functional mobility training;Stair training;Therapeutic activities;Therapeutic exercise;Patient/family education   PT Goals (Current goals can be found in the Care Plan section) Acute Rehab PT Goals Patient Stated Goal: to go home PT Goal Formulation: With patient Time For Goal Achievement: 09/09/14 Potential to Achieve Goals: Good    Frequency 7X/week   Barriers to discharge        Co-evaluation               End of Session Equipment Utilized During Treatment: Gait belt Activity Tolerance: Patient limited by pain Patient left: in chair;with family/visitor present;with call bell/phone within reach (in bone foam) Nurse Communication: Mobility status         Time: 3235-5732 PT Time Calculation (min) (ACUTE ONLY): 24 min   Charges:   PT Evaluation $Initial PT Evaluation Tier I: 1 Procedure PT Treatments $Gait Training: 8-22 mins   PT G Codes:        Cassell Clement, PT, CSCS Pager [phone] Office [phone]  08/26/2014, 11:01 AM

## 2014-08-26 NOTE — Evaluation (Signed)
Occupational Therapy Evaluation Patient Details Name: Brandon Miller MRN: [id number] DOB: October 29, 1965 Today's Date: 08/26/2014    History of Present Illness Brandon Miller, 49 y.o. male, has a history of pain and functional disability in the right knee due to arthritis. Pt is s/p R total knee arthroplasty.   Clinical Impression   Patient presenting with deconditioning, decreased I in self care, and decreased I in functional transfers/mobility. Patient reports being I in ADLs and IADLs PTA. Patient currently functioning at min A for functional mobility and transfer with mod A for LB self care. Patient will benefit from acute OT to increase overall independence in the areas of ADLs, functional mobility, safety, and pt education in order to safely discharge home.    Follow Up Recommendations  Home health OT    Equipment Recommendations  3 in 1 bedside comode;Tub/shower bench    Recommendations for Other Services  (none)     Precautions / Restrictions Restrictions Weight Bearing Restrictions: Yes RLE Weight Bearing: Weight bearing as tolerated      Mobility Bed Mobility                  Transfers Overall transfer level: Needs assistance Equipment used: Rolling walker (2 wheeled) Transfers: Sit to/from [employer] Transfers Sit to Stand: Min assist Stand pivot transfers: Min assist       General transfer comment: Pt received in recliner chair this session with sit <> stand with Min A and arm rest with RW. Pt ambulated 10' into bathroom for toilet transfer with stand pivot transfer onto elevated toilet seat. Pt required min verbal cues for hand placement and safety.    Balance Overall balance assessment: Needs assistance Sitting-balance support: No upper extremity supported Sitting balance-Leahy Scale: Good     Standing balance support: No upper extremity supported;During functional activity Standing balance-Leahy Scale: Fair Standing balance comment: standing at  sink side to wash hands with min A for balance.             ADL Overall ADL's : Needs assistance/impaired     Grooming: Wash/dry hands;Wash/dry face;Oral care;Brushing hair;Applying deodorant;Set up   Upper Body Bathing: Set up   Lower Body Bathing: Moderate assistance;Sit to/from stand   Upper Body Dressing : Set up   Lower Body Dressing: Moderate assistance   Toilet Transfer: Minimal assistance   Toileting- Clothing Manipulation and Hygiene: Min guard       Functional mobility during ADLs: Minimal assistance;Rolling walker       Vision     Perception     Praxis      Pertinent Vitals/Pain Pain Assessment: 0-10 Pain Score: 10-Worst pain ever Pain Descriptors / Indicators: Aching Pain Intervention(s): Limited activity within patient's tolerance     Hand Dominance Right   Extremity/Trunk Assessment Upper Extremity Assessment Upper Extremity Assessment: Overall WFL for tasks assessed   Lower Extremity Assessment Lower Extremity Assessment: Defer to PT evaluation       Communication Communication Communication: No difficulties   Cognition Arousal/Alertness: Awake/alert Behavior During Therapy: WFL for tasks assessed/performed Overall Cognitive Status: Within Functional Limits for tasks assessed         General Comments       Exercises       Shoulder Instructions      Home Living Family/patient expects to be discharged to:: Private residence Living Arrangements: Alone;Other relatives Available Help at Discharge: Family Type of Home: House Home Access: Stairs to enter [employer] of Steps: 1 Entrance Stairs-Rails: None Home Layout:  One level     Bathroom Shower/Tub: Tub/shower unit;Curtain Shower/tub characteristics: Industrial/product designer: Yes How Accessible: Accessible via walker Home Equipment: Bedside commode;Walker - 2 wheels          Prior Functioning/Environment Level of  Independence: Independent             OT Diagnosis: Generalized weakness   OT Problem List: Decreased strength;Decreased activity tolerance;Impaired balance (sitting and/or standing);Decreased safety awareness;Pain;Decreased knowledge of use of DME or AE   OT Treatment/Interventions: Self-care/ADL training;Therapeutic exercise;Energy conservation;DME and/or AE instruction;Patient/family education;Therapeutic activities;Balance training    OT Goals(Current goals can be found in the care plan section) Acute Rehab OT Goals Patient Stated Goal: to go home OT Goal Formulation: With patient Time For Goal Achievement: 09/09/14 Potential to Achieve Goals: Good ADL Goals Pt Will Perform Upper Body Bathing: with modified independence;sitting Pt Will Perform Lower Body Bathing: with supervision;with adaptive equipment;sit to/from stand Pt Will Perform Upper Body Dressing: with modified independence;sitting Pt Will Perform Lower Body Dressing: with supervision;with adaptive equipment;sit to/from stand Pt Will Transfer to Toilet: with modified independence;bedside commode;stand pivot transfer Pt Will Perform Toileting - Clothing Manipulation and hygiene: with modified independence;sit to/from stand Pt Will Perform Tub/Shower Transfer: with supervision;ambulating;tub bench;rolling walker;Stand pivot transfer  OT Frequency: Min 2X/week   Barriers to D/C:  (none at this time)          Co-evaluation              End of Session Equipment Utilized During Treatment: Rolling walker  Activity Tolerance: Patient tolerated treatment well Patient left: in chair;with call bell/phone within reach;with family/visitor present   Time: 9532-0233 OT Time Calculation (min): 23 min Charges:  OT General Charges $OT Visit: 1 Procedure OT Evaluation $Initial OT Evaluation Tier I: 1 Procedure OT Treatments $Self Care/Home Management : 8-22 mins G-Codes:    Phineas Semen 09-06-2014, 10:16  AM

## 2014-08-26 NOTE — Progress Notes (Signed)
Subjective: 1 Day Post-Op Procedure(s) (LRB): TOTAL KNEE ARTHROPLASTY (Right) Patient reports pain as moderate.  No nausea/vomiting, lightheadedness/dizziness, chest pain/sob.  Positive flatus but no bm.  Tolerating diet.  Objective: Vital signs in last 24 hours: Temp:  [97.9 F (36.6 C)-98.8 F (37.1 C)] 98.4 F (36.9 C) (08/04 0126) Pulse Rate:  [55-77] 68 (08/04 0126) Resp:  [8-23] 16 (08/04 0126) BP: (81-171)/(35-140) 120/57 mmHg (08/04 0126) SpO2:  [88 %-100 %] 93 % (08/04 0126) Weight:  [118.888 kg (262 lb 1.6 oz)] 118.888 kg (262 lb 1.6 oz) (08/03 0939)  Intake/Output from previous day: 08/03 0701 - 08/04 0700 In: 2600 [P.O.:600; I.V.:1600; IV Piggyback:100] Out: 1200 [Urine:725; Drains:475] Intake/Output this shift: Total I/O In: 100 [IV Piggyback:100] Out: 1000 [Urine:725; Drains:275]   Recent Labs  08/26/14 0413  HGB 11.1*    Recent Labs  08/26/14 0413  WBC 7.5  RBC 3.87*  HCT 33.5*  PLT 150    Recent Labs  08/26/14 0413  NA 137  K 4.3  CL 106  CO2 27  BUN 8  CREATININE 0.84  GLUCOSE 98  CALCIUM 8.1*   No results for input(s): LABPT, INR in the last 72 hours.  Neurologically intact Neurovascular intact Sensation intact distally Intact pulses distally Dorsiflexion/Plantar flexion intact Incision: moderate drainage No cellulitis present Compartment soft  hemovac drain pulled by me today Dressing changed by me today  Assessment/Plan: 1 Day Post-Op Procedure(s) (LRB): TOTAL KNEE ARTHROPLASTY (Right) Advance diet Up with therapy D/C IV fluids Discharge home with home health today vs. Tomorrow depending on pain control and how well he does with PT WBAT RLE ABLA- mild and stable Dry dressing change prn  Fannie Knee 08/26/2014, 6:16 AM

## 2014-08-27 NOTE — Anesthesia Postprocedure Evaluation (Signed)
Anesthesia Post Note  Patient: Brandon Miller  Procedure(s) Performed: Procedure(s) (LRB): TOTAL KNEE ARTHROPLASTY (Right)  Anesthesia type: General  Patient location: PACU  Post pain: Pain level controlled and Adequate analgesia  Post assessment: Post-op Vital signs reviewed, Patient's Cardiovascular Status Stable, Respiratory Function Stable, Patent Airway and Pain level controlled  Last Vitals:  Filed Vitals:   08/26/14 1500  BP: 126/66  Pulse: 70  Temp: 36.7 C  Resp: 18    Post vital signs: Reviewed and stable  Level of consciousness: awake, alert  and oriented  Complications: No apparent anesthesia complications

## 2015-09-18 ENCOUNTER — Emergency Department (HOSPITAL_COMMUNITY)
Admission: EM | Admit: 2015-09-18 | Discharge: 2015-09-18 | Disposition: A | Discharge status: Home / Self Care | Payer: BLUE CROSS/BLUE SHIELD | Attending: Physician Assistant | Admitting: Physician Assistant

## 2015-09-18 ENCOUNTER — Encounter (HOSPITAL_COMMUNITY): Payer: Self-pay

## 2015-09-18 DIAGNOSIS — E119 Type 2 diabetes mellitus without complications: Secondary | ICD-10-CM | POA: Insufficient documentation

## 2015-09-18 DIAGNOSIS — Z7901 Long term (current) use of anticoagulants: Secondary | ICD-10-CM | POA: Insufficient documentation

## 2015-09-18 DIAGNOSIS — Z96651 Presence of right artificial knee joint: Secondary | ICD-10-CM | POA: Diagnosis not present

## 2015-09-18 DIAGNOSIS — R780 Finding of alcohol in blood: Secondary | ICD-10-CM

## 2015-09-18 DIAGNOSIS — Z7984 Long term (current) use of oral hypoglycemic drugs: Secondary | ICD-10-CM | POA: Diagnosis not present

## 2015-09-18 DIAGNOSIS — Z0283 Encounter for blood-alcohol and blood-drug test: Secondary | ICD-10-CM | POA: Diagnosis present

## 2015-09-18 LAB — ETHANOL: ALCOHOL ETHYL (B): 79 mg/dL — AB (ref <5)
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
## 2015-09-18 NOTE — Discharge Instructions (Signed)
Your test read as ethanol level of .79,  please do not drink and drive. It is dangerous you could hurt yourself or someone else.

## 2017-09-27 DIAGNOSIS — M4056 Lordosis, unspecified, lumbar region: Secondary | ICD-10-CM | POA: Diagnosis not present

## 2017-09-27 DIAGNOSIS — M25562 Pain in left knee: Secondary | ICD-10-CM | POA: Diagnosis not present

## 2017-09-28 DIAGNOSIS — E099 Drug or chemical induced diabetes mellitus without complications: Secondary | ICD-10-CM | POA: Diagnosis not present

## 2017-09-28 DIAGNOSIS — E781 Pure hyperglyceridemia: Secondary | ICD-10-CM | POA: Diagnosis not present

## 2017-10-05 DIAGNOSIS — R945 Abnormal results of liver function studies: Secondary | ICD-10-CM | POA: Diagnosis not present

## 2017-10-05 DIAGNOSIS — Z0001 Encounter for general adult medical examination with abnormal findings: Secondary | ICD-10-CM | POA: Diagnosis not present

## 2017-10-05 DIAGNOSIS — Z23 Encounter for immunization: Secondary | ICD-10-CM | POA: Diagnosis not present

## 2017-11-16 DIAGNOSIS — F17200 Nicotine dependence, unspecified, uncomplicated: Secondary | ICD-10-CM | POA: Diagnosis not present

## 2018-01-26 ENCOUNTER — Other Ambulatory Visit: Payer: Self-pay

## 2018-01-26 ENCOUNTER — Emergency Department (HOSPITAL_COMMUNITY)
Admission: EM | Admit: 2018-01-26 | Discharge: 2018-01-27 | Disposition: A | Discharge status: Home / Self Care | Payer: 59 | Attending: Emergency Medicine | Admitting: Emergency Medicine

## 2018-01-26 ENCOUNTER — Encounter (HOSPITAL_COMMUNITY): Payer: Self-pay | Admitting: *Deleted

## 2018-01-26 DIAGNOSIS — R51 Headache: Secondary | ICD-10-CM | POA: Diagnosis present

## 2018-01-26 DIAGNOSIS — E119 Type 2 diabetes mellitus without complications: Secondary | ICD-10-CM | POA: Diagnosis not present

## 2018-01-26 DIAGNOSIS — B349 Viral infection, unspecified: Secondary | ICD-10-CM | POA: Diagnosis not present

## 2018-01-26 DIAGNOSIS — Z7984 Long term (current) use of oral hypoglycemic drugs: Secondary | ICD-10-CM | POA: Insufficient documentation

## 2018-01-26 DIAGNOSIS — F17210 Nicotine dependence, cigarettes, uncomplicated: Secondary | ICD-10-CM | POA: Insufficient documentation

## 2018-01-26 DIAGNOSIS — Z79899 Other long term (current) drug therapy: Secondary | ICD-10-CM | POA: Diagnosis not present

## 2018-01-26 NOTE — ED Triage Notes (Signed)
Pt c/o generalized body aches x 4 days; pt states he had n/v/d last week before the body aches started

## 2018-01-27 LAB — CK: Total CK: 95 U/L (ref 49–397)

## 2018-01-27 LAB — INFLUENZA PANEL BY PCR (TYPE A & B)
INFLBPCR: NEGATIVE
Influenza A By PCR: NEGATIVE

## 2018-01-27 LAB — BASIC METABOLIC PANEL
Anion gap: 8 (ref 5–15)
BUN: 16 mg/dL (ref 6–20)
CALCIUM: 7.8 mg/dL — AB (ref 8.9–10.3)
CO2: 22 mmol/L (ref 22–32)
Chloride: 106 mmol/L (ref 98–111)
Creatinine, Ser: 1.08 mg/dL (ref 0.61–1.24)
GLUCOSE: 92 mg/dL (ref 70–99)
Potassium: 3.9 mmol/L (ref 3.5–5.1)
Sodium: 136 mmol/L (ref 135–145)

## 2018-01-27 LAB — CBC WITH DIFFERENTIAL/PLATELET
Abs Immature Granulocytes: 0.02 10*3/uL (ref 0.00–0.07)
Basophils Absolute: 0.1 10*3/uL (ref 0.0–0.1)
Basophils Relative: 1 %
Eosinophils Absolute: 0.1 10*3/uL (ref 0.0–0.5)
Eosinophils Relative: 2 %
HCT: 45.2 % (ref 39.0–52.0)
Hemoglobin: 15.2 g/dL (ref 13.0–17.0)
Immature Granulocytes: 0 %
Lymphocytes Relative: 38 %
Lymphs Abs: 2.9 10*3/uL (ref 0.7–4.0)
MCH: 31 pg (ref 26.0–34.0)
MCHC: 33.6 g/dL (ref 30.0–36.0)
MCV: 92.1 fL (ref 80.0–100.0)
MONOS PCT: 7 %
Monocytes Absolute: 0.5 10*3/uL (ref 0.1–1.0)
NRBC: 0 % (ref 0.0–0.2)
Neutro Abs: 4 10*3/uL (ref 1.7–7.7)
Neutrophils Relative %: 52 %
PLATELETS: 158 10*3/uL (ref 150–400)
RBC: 4.91 MIL/uL (ref 4.22–5.81)
RDW: 12 % (ref 11.5–15.5)
WBC: 7.6 10*3/uL (ref 4.0–10.5)

## 2018-01-27 MED ORDER — CALCIUM CITRATE-VITAMIN D 250-100 MG-UNIT PO TABS: 1.0000 | ORAL_TABLET | Freq: Two times a day (BID) | ORAL | 0 refills | Status: DC

## 2018-01-27 MED ORDER — HYDROCODONE-ACETAMINOPHEN 5-325 MG PO TABS
1.0000 | ORAL_TABLET | Freq: Once | ORAL | Status: AC
  Administered 2018-01-27: 1 via ORAL
  Filled 2018-01-27: qty 1

## 2018-01-27 NOTE — ED Provider Notes (Signed)
[facility] EMERGENCY DEPARTMENT Provider Note   CSN: [id number] Arrival date & time: 01/26/18  2302     History   Chief Complaint Chief Complaint  Patient presents with  . Generalized Body Aches    HPI Brandon Miller is a 53 y.o. male.  The history is provided by the patient and a relative.  Illness  This is a new problem. The current episode started more than 2 days ago. The problem occurs daily. The problem has been gradually worsening. Associated symptoms include headaches. Pertinent negatives include no chest pain, no abdominal pain and no shortness of breath. Nothing aggravates the symptoms. Nothing relieves the symptoms.  Patient with history of depression, diabetes, rheumatoid arthritis presents with myalgias.  He reports the past 4 days has had diffuse body aches and headache.  No cough, no vomiting.  No sore throat. No rash.  No known carbon monoxide exposure. He does report that last week he had several days of nausea vomiting diarrhea that has since resolved.   Past Medical History:  Diagnosis Date  . Allergy   . Anemia   . Anxiety   . Arthritis    RA  . Complication of anesthesia    hard to wake up-was told he may have sleep apnea-never tested  . Depression   . Diabetes mellitus without complication ([place])   . Sleep apnea   . Snores     Patient Active Problem List   Diagnosis Date Noted  . DJD (degenerative joint disease) of knee 08/25/2014  . Diabetes mellitus, type 2 ([place]) 10/31/2011  . Hyperglycemia 10/31/2011  . PNA (pneumonia) 10/30/2011  . LEG PAIN, RIGHT 01/20/2010    Past Surgical History:  Procedure Laterality Date  . ELBOW SURGERY Right 2010   Dr. Percell Miller  . HERNIA REPAIR    . KNEE ARTHROSCOPY WITH MEDIAL MENISECTOMY Right 06/11/2013   Procedure: RIGHT KNEE ARTHROSCOPY WITH MEDIAL MENISECTOMY AND CHONDROPLASTY;  Surgeon: Ninetta Lights, MD;  Location: [facility];  Service: Orthopedics;  Laterality: Right;  Chondroplasty, loose  bodies, medial menisectomy, lysis of adhesions.  Marland Kitchen KNEE SURGERY Right 2011  . right ankle    . SHOULDER SURGERY Right 2010   Dr. Percell Miller  . TOTAL KNEE ARTHROPLASTY Right 08/25/2014   Procedure: TOTAL KNEE ARTHROPLASTY;  Surgeon: Ninetta Lights, MD;  Location: [facility];  Service: Orthopedics;  Laterality: Right;        Home Medications    Prior to Admission medications   Medication Sig Start Date End Date Taking? Authorizing Provider  Adalimumab (HUMIRA) 20 MG/0.4ML KIT Inject into the skin. Every 2 weeks    [provider]  allopurinol (ZYLOPRIM) 300 MG tablet Take 300 mg by mouth daily.    [provider]  ALPRAZolam Duanne Moron) 1 MG tablet Take 1 mg by mouth at bedtime as needed. For anxiety    [provider]  amphetamine-dextroamphetamine (ADDERALL XR) 25 MG 24 hr capsule Take 25 mg by mouth every morning.    [provider]  apixaban (ELIQUIS) 2.5 MG TABS tablet Take 1 tab po q12 hours x 14 days following surgery to prevent blood clots 08/25/14   Aundra Dubin, PA-C  bisacodyl (DULCOLAX) 5 MG EC tablet Take 1 tablet (5 mg total) by mouth daily as needed for moderate constipation. 08/25/14   Aundra Dubin, PA-C  bumetanide (BUMEX) 0.5 MG tablet Take 0.5 mg by mouth daily.    [provider]  cyclobenzaprine (FLEXERIL) 10 MG tablet Take  10 mg by mouth 3 (three) times daily as needed. For muscle spasms    [provider]  DULoxetine (CYMBALTA) 60 MG capsule Take 60 mg by mouth 2 (two) times daily.    [provider]  fexofenadine (ALLEGRA) 180 MG tablet Take 180 mg by mouth daily.    [provider]  folic acid (FOLVITE) 1 MG tablet Take 1 mg by mouth daily.    [provider]  glipiZIDE (GLUCOTROL XL) 5 MG 24 hr tablet Take 5 mg by mouth daily with breakfast.    [provider]  HYDROmorphone (DILAUDID) 4 MG tablet Take 1-2 tabs po q4-6 hours prn pain 08/25/14   Aundra Dubin, PA-C  metFORMIN  (GLUCOPHAGE-XR) 500 MG 24 hr tablet TAKE 1 TABLET BY MOUTH EVERY DAY WITH EVENING MEAL 06/27/14   [provider]  methotrexate (RHEUMATREX) 2.5 MG tablet Take 20 mg by mouth once a week. Sunday    [provider]  mirtazapine (REMERON) 30 MG tablet Take 30 mg by mouth at bedtime.    [provider]  ondansetron (ZOFRAN) 4 MG tablet Take 1 tablet (4 mg total) by mouth every 8 (eight) hours as needed for nausea or vomiting. 08/25/14   Aundra Dubin, PA-C    Family History History reviewed. No pertinent family history.  Social History Social History   Tobacco Use  . Smoking status: Current Every Day Smoker    Packs/day: 0.50    Years: 25.00    Pack years: 12.50    Types: Cigarettes  . Smokeless tobacco: Never Used  Substance Use Topics  . Alcohol use: Yes    Comment: occasionally  . Drug use: No     Allergies   Daypro [oxaprozin]; Indocin [indomethacin]; and Zanaflex [tizanidine hcl]   Review of Systems Review of Systems  Constitutional: Positive for fatigue.  Respiratory: Negative for shortness of breath.   Cardiovascular: Negative for chest pain.  Gastrointestinal: Negative for abdominal pain.  Musculoskeletal: Positive for myalgias. Negative for neck stiffness.  Neurological: Positive for headaches.  All other systems reviewed and are negative.    Physical Exam Updated Vital Signs BP 114/71 (BP Location: Left Arm)   Pulse 67   Temp 98.3 F (36.8 C) (Oral)   Resp 17   Ht 1.88 m (_0 )   Wt 95.3 kg   SpO2 96%   BMI 26.96 kg/m   Physical Exam CONSTITUTIONAL: Well developed/well nourished HEAD: Normocephalic/atraumatic EYES: EOMI/PERRL ENMT: Mucous membranes moist uvula midline without erythema or exudates NECK: supple no meningeal signs SPINE/BACK:entire spine nontender CV: S1/S2 noted, no murmurs/rubs/gallops noted LUNGS: Lungs are clear to auscultation bilaterally, no apparent distress ABDOMEN: soft, nontender, no rebound or  guarding, bowel sounds noted throughout abdomen GU:no cva tenderness NEURO: Pt is awake/alert/appropriate, moves all extremitiesx4.  No facial droop.   EXTREMITIES: pulses normal/equal, full ROM SKIN: warm, color normal no rash PSYCH: no abnormalities of mood noted, alert and oriented to situation   ED Treatments / Results  Labs (all labs ordered are listed, but only abnormal results are displayed) Labs Reviewed  BASIC METABOLIC PANEL - Abnormal; Notable for the following components:      Result Value   Calcium 7.8 (*)    All other components within normal limits  CBC WITH DIFFERENTIAL/PLATELET  CK  INFLUENZA PANEL BY PCR (TYPE A & B)    EKG None  Radiology No results found.  Procedures Procedures (including critical care time)  Medications Ordered in ED Medications  HYDROcodone-acetaminophen (NORCO/VICODIN) 5-325 MG per tablet 1 tablet (1 tablet Oral Given 01/27/18 0138)     Initial Impression / Assessment and Plan / ED Course  I have reviewed the triage vital signs and the nursing notes.  Pertinent labs  results that were available during my care of the patient were reviewed by me and considered in my medical decision making (see chart for details).     She presents for myalgias for several days.  No fever recorded here, though he have had a fever.  His symptoms sound consistent with a viral illness, though no cough or sore throat.  No signs of meningitis.  He is clinically well-appearing.  Labs reassuring except mild hypocalcemia, will advise oral calcium supplementation 2:28 AM Headache nearly resolved.  Patient is awake and alert and moves comfortably without any difficulty. I updated his med list as he is on minimal medications at this time.  He is no longer on therapy for RA. He is awake alert, no acute distress.  No meningeal signs.  He is smiling and interactive. Strong suspicion this is combination of viral illness as well as aftereffects of his recent GI  illness. He may have had fevers in the past several days that have since resolved. Hypocalcemia may have contributed some of his symptoms. He is appropriate for discharge and outpatient management.  We discussed return precautions. Final Clinical Impressions(s) / ED Diagnoses   Final diagnoses:  Viral syndrome  Hypocalcemia    ED Discharge Orders         Ordered    calcium-vitamin D 250-100 MG-UNIT tablet  2 times daily     01/27/18 0215           Ripley Fraise, MD 01/27/18 [phone]

## 2018-02-04 ENCOUNTER — Observation Stay (HOSPITAL_COMMUNITY)
Admission: EM | Admit: 2018-02-04 | Discharge: 2018-02-06 | Disposition: A | Discharge status: Home / Self Care | Payer: 59 | Attending: Internal Medicine | Admitting: Internal Medicine

## 2018-02-04 ENCOUNTER — Other Ambulatory Visit: Payer: Self-pay

## 2018-02-04 ENCOUNTER — Encounter (HOSPITAL_COMMUNITY): Payer: Self-pay | Admitting: *Deleted

## 2018-02-04 ENCOUNTER — Emergency Department (HOSPITAL_COMMUNITY): Payer: 59

## 2018-02-04 DIAGNOSIS — K7010 Alcoholic hepatitis without ascites: Secondary | ICD-10-CM | POA: Insufficient documentation

## 2018-02-04 DIAGNOSIS — Z79899 Other long term (current) drug therapy: Secondary | ICD-10-CM | POA: Insufficient documentation

## 2018-02-04 DIAGNOSIS — F17210 Nicotine dependence, cigarettes, uncomplicated: Secondary | ICD-10-CM | POA: Diagnosis not present

## 2018-02-04 DIAGNOSIS — F10230 Alcohol dependence with withdrawal, uncomplicated: Secondary | ICD-10-CM

## 2018-02-04 DIAGNOSIS — Z811 Family history of alcohol abuse and dependence: Secondary | ICD-10-CM | POA: Insufficient documentation

## 2018-02-04 DIAGNOSIS — F1020 Alcohol dependence, uncomplicated: Secondary | ICD-10-CM | POA: Diagnosis present

## 2018-02-04 DIAGNOSIS — F32A Depression, unspecified: Secondary | ICD-10-CM

## 2018-02-04 DIAGNOSIS — E119 Type 2 diabetes mellitus without complications: Secondary | ICD-10-CM | POA: Insufficient documentation

## 2018-02-04 DIAGNOSIS — R740 Nonspecific elevation of levels of transaminase and lactic acid dehydrogenase [LDH]: Secondary | ICD-10-CM | POA: Insufficient documentation

## 2018-02-04 DIAGNOSIS — H538 Other visual disturbances: Secondary | ICD-10-CM | POA: Diagnosis not present

## 2018-02-04 DIAGNOSIS — R251 Tremor, unspecified: Secondary | ICD-10-CM | POA: Diagnosis present

## 2018-02-04 DIAGNOSIS — F10232 Alcohol dependence with withdrawal with perceptual disturbance: Secondary | ICD-10-CM | POA: Diagnosis present

## 2018-02-04 DIAGNOSIS — Z888 Allergy status to other drugs, medicaments and biological substances status: Secondary | ICD-10-CM | POA: Insufficient documentation

## 2018-02-04 DIAGNOSIS — Z96651 Presence of right artificial knee joint: Secondary | ICD-10-CM | POA: Insufficient documentation

## 2018-02-04 DIAGNOSIS — F13239 Sedative, hypnotic or anxiolytic dependence with withdrawal, unspecified: Secondary | ICD-10-CM | POA: Diagnosis not present

## 2018-02-04 DIAGNOSIS — R569 Unspecified convulsions: Principal | ICD-10-CM | POA: Insufficient documentation

## 2018-02-04 DIAGNOSIS — R51 Headache: Secondary | ICD-10-CM | POA: Diagnosis not present

## 2018-02-04 DIAGNOSIS — F419 Anxiety disorder, unspecified: Secondary | ICD-10-CM | POA: Insufficient documentation

## 2018-02-04 DIAGNOSIS — F10239 Alcohol dependence with withdrawal, unspecified: Secondary | ICD-10-CM | POA: Diagnosis not present

## 2018-02-04 DIAGNOSIS — F10932 Alcohol use, unspecified with withdrawal with perceptual disturbance: Secondary | ICD-10-CM

## 2018-02-04 DIAGNOSIS — R7401 Elevation of levels of liver transaminase levels: Secondary | ICD-10-CM

## 2018-02-04 DIAGNOSIS — H532 Diplopia: Secondary | ICD-10-CM | POA: Diagnosis not present

## 2018-02-04 DIAGNOSIS — Z720 Tobacco use: Secondary | ICD-10-CM

## 2018-02-04 DIAGNOSIS — F329 Major depressive disorder, single episode, unspecified: Secondary | ICD-10-CM | POA: Insufficient documentation

## 2018-02-04 DIAGNOSIS — R253 Fasciculation: Secondary | ICD-10-CM | POA: Insufficient documentation

## 2018-02-04 DIAGNOSIS — E876 Hypokalemia: Secondary | ICD-10-CM | POA: Insufficient documentation

## 2018-02-04 DIAGNOSIS — F10930 Alcohol use, unspecified with withdrawal, uncomplicated: Secondary | ICD-10-CM | POA: Diagnosis present

## 2018-02-04 DIAGNOSIS — F1019 Alcohol abuse with unspecified alcohol-induced disorder: Secondary | ICD-10-CM

## 2018-02-04 LAB — CBC WITH DIFFERENTIAL/PLATELET
ABS IMMATURE GRANULOCYTES: 0.02 10*3/uL (ref 0.00–0.07)
Basophils Absolute: 0 10*3/uL (ref 0.0–0.1)
Basophils Relative: 1 %
EOS PCT: 1 %
Eosinophils Absolute: 0 10*3/uL (ref 0.0–0.5)
HEMATOCRIT: 44.1 % (ref 39.0–52.0)
HEMOGLOBIN: 15.2 g/dL (ref 13.0–17.0)
IMMATURE GRANULOCYTES: 0 %
LYMPHS ABS: 1.3 10*3/uL (ref 0.7–4.0)
Lymphocytes Relative: 19 %
MCH: 30.8 pg (ref 26.0–34.0)
MCHC: 34.5 g/dL (ref 30.0–36.0)
MCV: 89.5 fL (ref 80.0–100.0)
Monocytes Absolute: 0.5 10*3/uL (ref 0.1–1.0)
Monocytes Relative: 8 %
NEUTROS PCT: 71 %
NRBC: 0 % (ref 0.0–0.2)
Neutro Abs: 4.6 10*3/uL (ref 1.7–7.7)
Platelets: 112 10*3/uL — ABNORMAL LOW (ref 150–400)
RBC: 4.93 MIL/uL (ref 4.22–5.81)
RDW: 11.9 % (ref 11.5–15.5)
WBC: 6.4 10*3/uL (ref 4.0–10.5)

## 2018-02-04 LAB — BASIC METABOLIC PANEL
Anion gap: 8 (ref 5–15)
BUN: 10 mg/dL (ref 6–20)
CHLORIDE: 105 mmol/L (ref 98–111)
CO2: 20 mmol/L — AB (ref 22–32)
CREATININE: 0.73 mg/dL (ref 0.61–1.24)
Calcium: 8.3 mg/dL — ABNORMAL LOW (ref 8.9–10.3)
GFR calc Af Amer: >60 mL/min (ref >60)
GFR calc non Af Amer: >60 mL/min (ref >60)
Glucose, Bld: 117 mg/dL — ABNORMAL HIGH (ref 70–99)
Potassium: 3.6 mmol/L (ref 3.5–5.1)
Sodium: 133 mmol/L — ABNORMAL LOW (ref 135–145)

## 2018-02-04 LAB — ETHANOL: Alcohol, Ethyl (B): <10 mg/dL (ref <10)

## 2018-02-04 LAB — MAGNESIUM: Magnesium: 2.1 mg/dL (ref 1.7–2.4)

## 2018-02-04 LAB — TSH: TSH: 3.595 u[IU]/mL (ref 0.350–4.500)

## 2018-02-04 MED ORDER — THIAMINE HCL 100 MG/ML IJ SOLN
INTRAMUSCULAR | Status: AC
  Filled 2018-02-04: qty 2

## 2018-02-04 MED ORDER — AMPHETAMINE-DEXTROAMPHET ER 25 MG PO CP24: 25.0000 mg | ORAL_CAPSULE | ORAL | Status: DC

## 2018-02-04 MED ORDER — LORAZEPAM 2 MG/ML IJ SOLN
2.0000 mg | INTRAMUSCULAR | Status: DC | PRN
  Administered 2018-02-04 – 2018-02-06 (×6): 2 mg via INTRAVENOUS
  Filled 2018-02-04 (×6): qty 1

## 2018-02-04 MED ORDER — THIAMINE HCL 100 MG/ML IJ SOLN
Freq: Once | INTRAVENOUS | Status: DC
  Filled 2018-02-04: qty 1000

## 2018-02-04 MED ORDER — M.V.I. ADULT IV INJ
INJECTION | INTRAVENOUS | Status: AC
  Filled 2018-02-04: qty 10

## 2018-02-04 MED ORDER — FOLIC ACID 5 MG/ML IJ SOLN
INTRAMUSCULAR | Status: AC
  Filled 2018-02-04: qty 0.2

## 2018-02-04 MED ORDER — CALCIUM GLUCONATE-NACL 1-0.675 GM/50ML-% IV SOLN
1.0000 g | Freq: Once | INTRAVENOUS | Status: AC
  Administered 2018-02-04: 1000 mg via INTRAVENOUS
  Filled 2018-02-04: qty 50

## 2018-02-04 MED ORDER — SODIUM CHLORIDE 0.9 % IV SOLN
1.0000 g | Freq: Once | INTRAVENOUS | Status: DC
  Filled 2018-02-04: qty 10

## 2018-02-04 MED ORDER — SODIUM CHLORIDE 0.9 % IV BOLUS
1000.0000 mL | Freq: Once | INTRAVENOUS | Status: AC
  Administered 2018-02-04: 1000 mL via INTRAVENOUS

## 2018-02-04 MED ORDER — THIAMINE HCL 100 MG/ML IJ SOLN
100.0000 mg | Freq: Once | INTRAMUSCULAR | Status: AC
  Administered 2018-02-04: 100 mg via INTRAVENOUS
  Filled 2018-02-04: qty 2

## 2018-02-04 MED ORDER — LORAZEPAM 2 MG/ML IJ SOLN
2.0000 mg | Freq: Once | INTRAMUSCULAR | Status: AC
  Administered 2018-02-04: 2 mg via INTRAVENOUS
  Filled 2018-02-04: qty 1

## 2018-02-04 MED ORDER — ONDANSETRON HCL 4 MG PO TABS: 4.0000 mg | ORAL_TABLET | Freq: Four times a day (QID) | ORAL | Status: DC | PRN

## 2018-02-04 MED ORDER — ONDANSETRON HCL 4 MG/2ML IJ SOLN: 4.0000 mg | Freq: Four times a day (QID) | INTRAMUSCULAR | Status: DC | PRN

## 2018-02-04 NOTE — ED Notes (Addendum)
Patient complaining of double vision and tremors since last night. States he did not pick up his calcium prescription "because I just didn't have time and didn't know how much it was going to cost." Equal strength bilaterally in upper and lower extremities. Also complaining of headache.

## 2018-02-04 NOTE — H&P (Signed)
TRH H&P    Patient Demographics:    Brandon Miller, is a 53 y.o. male  MRN: [id number]  DOB - Apr 14, 1965  Admit Date - 02/04/2018  Referring MD/NP/PA: Dr. Eulis Foster  Outpatient Primary MD for the patient is Celene Squibb, MD  Patient coming from: Home  Chief complaint-blurry vision   HPI:    Brandon Miller  is a 53 y.o. male, with history of diabetes mellitus, diet controlled, anxiety, alcohol abuse came to hospital with complaints of blurry vision.  Patient says that he also noted double vision yesterday.  Has been feeling nervous and tremulousness.  Dizzy when he tries to walk.  Complains of generalized body aches. Denied fever chills nausea vomiting. No chest pain or shortness of breath. Patient takes Adderall and Wellbutrin In the ED when patient was about to get MRI he had tonic-clonic seizure.  Ativan 2 mg IV was given.  CT of the head and MRI brain were unremarkable. Patient is not in alcohol withdrawal at this time. No previous history of CAD No history of CHF   Review of systems:    In addition to the HPI above,   All other systems reviewed and are negative.    Past History of the following :    Past Medical History:  Diagnosis Date  . Allergy   . Anemia   . Anxiety   . Arthritis    RA  . Complication of anesthesia    hard to wake up-was told he may have sleep apnea-never tested  . Depression   . Diabetes mellitus without complication ([place])   . Sleep apnea   . Snores       Past Surgical History:  Procedure Laterality Date  . ELBOW SURGERY Right 2010   Dr. Percell Miller  . HERNIA REPAIR    . KNEE ARTHROSCOPY WITH MEDIAL MENISECTOMY Right 06/11/2013   Procedure: RIGHT KNEE ARTHROSCOPY WITH MEDIAL MENISECTOMY AND CHONDROPLASTY;  Surgeon: Ninetta Lights, MD;  Location: [facility];  Service: Orthopedics;  Laterality: Right;  Chondroplasty, loose bodies, medial menisectomy, lysis  of adhesions.  Marland Kitchen KNEE SURGERY Right 2011  . right ankle    . SHOULDER SURGERY Right 2010   Dr. Percell Miller  . TOTAL KNEE ARTHROPLASTY Right 08/25/2014   Procedure: TOTAL KNEE ARTHROPLASTY;  Surgeon: Ninetta Lights, MD;  Location: [facility];  Service: Orthopedics;  Laterality: Right;      Social History:      Social History   Tobacco Use  . Smoking status: Current Every Day Smoker    Packs/day: 0.50    Years: 25.00    Pack years: 12.50    Types: Cigarettes  . Smokeless tobacco: Never Used  Substance Use Topics  . Alcohol use: Yes    Comment: occasionally       Family History :   Patient brother had very serious alcohol abuse   Home Medications:   Prior to Admission medications   Medication Sig Start Date End Date Taking? Authorizing Provider  ALPRAZolam Duanne Moron) 1 MG tablet Take 1-2  mg by mouth at bedtime. For anxiety    Yes [provider]  amphetamine-dextroamphetamine (ADDERALL XR) 25 MG 24 hr capsule Take 25 mg by mouth every morning.   Yes [provider]  buPROPion (WELLBUTRIN XL) 300 MG 24 hr tablet Take 300 mg by mouth daily. 12/21/17  Yes [provider]  calcium-vitamin D 250-100 MG-UNIT tablet Take 1 tablet by mouth 2 (two) times daily. Patient not taking: Reported on 02/04/2018 01/27/18   Ripley Fraise, MD     Allergies:     Allergies  Allergen Reactions  . Daypro [Oxaprozin] Other (See Comments)    'tears his stomach up"  . Diclofenac Sodium Other (See Comments)    dizzy  . Indocin [Indomethacin] Nausea And Vomiting  . Zanaflex [Tizanidine Hcl] Swelling     Physical Exam:   Vitals  Blood pressure 124/75, pulse 78, temperature 98.1 F (36.7 C), temperature source Oral, resp. rate (!) 24, height 6\' 2"  (1.88 m), weight 95.3 kg, SpO2 98 %.  1.  General: Appears in no acute distress  2. Psychiatric: Alert, oriented x3, intact judgment and insight.  3. Neurologic: Cranial nerves II through XII grossly intact, moving all  extremities.  No focal deficit noted.  4. HEENMT:  Atraumatic normocephalic, oral mucosa is moist.  PERRLA  5. Respiratory : Clear to auscultation bilaterally, no wheezing or crackles auscultated.  Normal respiratory effort  6. Cardiovascular : S1-S2 regular, no murmurs rubs auscultated.  No leg edema noted.  7. Gastrointestinal:  Abdomen is soft, nontender no hepatosplenomegaly.  No rigidity or guarding.  8. Skin:  No rash noted  9.Musculoskeletal:  No limitation of range of motion in both upper and lower extremities    Data Review:    CBC Recent Labs  Lab 02/04/18 1543  WBC 6.4  HGB 15.2  HCT 44.1  PLT 112*  MCV 89.5  MCH 30.8  MCHC 34.5  RDW 11.9  LYMPHSABS 1.3  MONOABS 0.5  EOSABS 0.0  BASOSABS 0.0   ------------------------------------------------------------------------------------------------------------------  Results for orders placed or performed during the hospital encounter of 02/04/18 (from the past 48 hour(s))  CBC with Differential     Status: Abnormal   Collection Time: 02/04/18  3:43 PM  Result Value Ref Range   WBC 6.4 4.0 - 10.5 K/uL   RBC 4.93 4.22 - 5.81 MIL/uL   Hemoglobin 15.2 13.0 - 17.0 g/dL   HCT 44.1 39.0 - 52.0 %   MCV 89.5 80.0 - 100.0 fL   MCH 30.8 26.0 - 34.0 pg   MCHC 34.5 30.0 - 36.0 g/dL   RDW 11.9 11.5 - 15.5 %   Platelets 112 (L) 150 - 400 K/uL    Comment: PLATELET COUNT CONFIRMED BY SMEAR SPECIMEN CHECKED FOR CLOTS Immature Platelet Fraction may be clinically indicated, consider ordering this additional test [id number]    nRBC 0.0 0.0 - 0.2 %   Neutrophils Relative % 71 %   Neutro Abs 4.6 1.7 - 7.7 K/uL   Lymphocytes Relative 19 %   Lymphs Abs 1.3 0.7 - 4.0 K/uL   Monocytes Relative 8 %   Monocytes Absolute 0.5 0.1 - 1.0 K/uL   Eosinophils Relative 1 %   Eosinophils Absolute 0.0 0.0 - 0.5 K/uL   Basophils Relative 1 %   Basophils Absolute 0.0 0.0 - 0.1 K/uL   Immature Granulocytes 0 %   Abs Immature  Granulocytes 0.02 0.00 - 0.07 K/uL    Comment: Performed at [facility], [address]  [place], [place] [address]  Basic metabolic panel     Status: Abnormal   Collection Time: 02/04/18  3:43 PM  Result Value Ref Range   Sodium 133 (L) 135 - 145 mmol/L   Potassium 3.6 3.5 - 5.1 mmol/L   Chloride 105 98 - 111 mmol/L   CO2 20 (L) 22 - 32 mmol/L   Glucose, Bld 117 (H) 70 - 99 mg/dL   BUN 10 6 - 20 mg/dL   Creatinine, Ser 0.73 0.61 - 1.24 mg/dL   Calcium 8.3 (L) 8.9 - 10.3 mg/dL   GFR calc non Af Amer >60 >60 mL/min   GFR calc Af Amer >60 >60 mL/min   Anion gap 8 5 - 15    Comment: Performed at [facility], [address], [place], [place] [address]  Ethanol     Status: None   Collection Time: 02/04/18  3:43 PM  Result Value Ref Range   Alcohol, Ethyl (B) <10 <10 mg/dL    Comment: (NOTE) Lowest detectable limit for serum alcohol is 10 mg/dL. For medical purposes only. Performed at [facility], [address] [place], [place] [address]     Chemistries  Recent Labs  Lab 02/04/18 1543  NA 133*  K 3.6  CL 105  CO2 20*  GLUCOSE 117*  BUN 10  CREATININE 0.73  CALCIUM 8.3*   ------------------------------------------------------------------------------------------------------------------  ------------------------------------------------------------------------------------------------------------------ GFR: Estimated Creatinine Clearance: 127 mL/min (by C-G formula based on SCr of 0.73 mg/dL). Liver Function Tests: No results for input(s): AST, ALT, ALKPHOS, BILITOT, PROT, ALBUMIN in the last 168 hours. No results for input(s): LIPASE, AMYLASE in the last 168 hours. No results for input(s): AMMONIA in the last 168 hours. Coagulation Profile: No results for input(s): INR, PROTIME in the last 168 hours. Cardiac Enzymes: No results for input(s): CKTOTAL, CKMB, CKMBINDEX, TROPONINI in the last 168 hours. BNP (last 3 results) No results for input(s): PROBNP in the last 8760  hours. HbA1C: No results for input(s): HGBA1C in the last 72 hours. CBG: No results for input(s): GLUCAP in the last 168 hours. Lipid Profile: No results for input(s): CHOL, HDL, LDLCALC, TRIG, CHOLHDL, LDLDIRECT in the last 72 hours. Thyroid Function Tests: No results for input(s): TSH, T4TOTAL, FREET4, T3FREE, THYROIDAB in the last 72 hours. Anemia Panel: No results for input(s): VITAMINB12, FOLATE, FERRITIN, TIBC, IRON, RETICCTPCT in the last 72 hours.  --------------------------------------------------------------------------------------------------------------- Urine analysis: No results found for: COLORURINE, APPEARANCEUR, LABSPEC, PHURINE, GLUCOSEU, HGBUR, BILIRUBINUR, KETONESUR, PROTEINUR, UROBILINOGEN, NITRITE, LEUKOCYTESUR    Imaging Results:    Ct Head Wo Contrast  Result Date: 02/04/2018 CLINICAL DATA:  Double vision EXAM: CT HEAD WITHOUT CONTRAST TECHNIQUE: Contiguous axial images were obtained from the base of the skull through the vertex without intravenous contrast. COMPARISON:  None. FINDINGS: Brain: No evidence of acute infarction, hemorrhage, hydrocephalus, extra-axial collection or mass lesion/mass effect. Vascular: No hyperdense vessel or unexpected calcification. Skull: Normal. Negative for fracture or focal lesion. Sinuses/Orbits: No acute finding. Other: None. IMPRESSION: No acute abnormality noted. Electronically Signed   By: Inez Catalina M.D.   On: 02/04/2018 18:57   Mr Brain Wo Contrast  Result Date: 02/04/2018 CLINICAL DATA:  Headache. Diplopia. Ataxia, stroke suspected. Seizure. EXAM: MRI HEAD WITHOUT CONTRAST TECHNIQUE: Multiplanar, multiecho pulse sequences of the brain and surrounding structures were obtained without intravenous contrast. COMPARISON:  None. FINDINGS: Brain: No acute infarct, hemorrhage, or mass lesion is present. The ventricles are of normal size. No significant white matter lesions are present. No significant extraaxial fluid  collection is  present. The internal auditory canals are within normal limits. The brainstem and cerebellum are within normal limits. Vascular: Flow is present in the major intracranial arteries. The skull is Skull and upper cervical spine: The craniocervical junction is normal. Upper cervical spine is within normal limits. Marrow signal is unremarkable. Sinuses/Orbits: Left maxillary antrostomy is noted. Paranasal sinuses are clear. There is some fluid in the right mastoid air cells. No obstructing nasopharyngeal lesion is present. The globes and orbits are within normal limits. IMPRESSION: 1. Normal MRI the brain for age. No acute or focal lesion to explain headache, diplopia, or seizure. 2. Left maxillary sinus surgery. Electronically Signed   By: San Morelle M.D.   On: 02/04/2018 18:21    My personal review of EKG: Rhythm NSR   Assessment & Plan:    Active Problems:   Alcohol withdrawal seizure without complication ([place])   1. Alcohol withdrawal seizure-patient likely has alcohol withdrawal seizure, CT head and MRI brain are unremarkable.  Will monitor in stepdown unit, start CIWA protocol.  Will obtain EEG in a.m.  Patient also takes Wellbutrin which lowers the seizure threshold.  Will discontinue Wellbutrin at this time.  2. Alcohol abuse-counseled, patient will need outpatient rehab information at the time of discharge.    DVT Prophylaxis-   SCDs  AM Labs Ordered, also please review Full Orders  Family Communication: Admission, patients condition and plan of care including tests being ordered have been discussed with the patient and his sister at bedside* who indicate understanding and agree with the plan and Code Status.  Code Status: Full code  Admission status: Inpatient: Based on patients clinical presentation and evaluation of above clinical data, I have made determination that patient meets Inpatient criteria at this time.  Patient with alcohol withdrawal seizure, admitted with CIWA  protocol  Time spent in minutes : 60 minutes   Oswald Hillock M.D on 02/04/2018 at 10:04 PM  Between 7am to 7pm - Pager - [phone]. After 7pm go to [email or web address] - password [facility]   [facility] - Office  [phone]

## 2018-02-04 NOTE — ED Notes (Signed)
Call from MRI stating patient needed medication for anxiety due to inability to tolerate MRI machine. Advised Dr Eulis Foster, verbal order for ativan 2mg  IV to be given. While pulling medication MRI called and states patient is having seizure. Went to MRI and patient was postictal. Gave 2 mg ativan IV. Patient was diaphoretic and breathing on his own. No monitor to obtain vital signs. Patient brought back to room 12.

## 2018-02-04 NOTE — ED Provider Notes (Signed)
[facility] EMERGENCY DEPARTMENT Provider Note   CSN: [id number] Arrival date & time: 02/04/18  1511     History   Chief Complaint Chief Complaint  Patient presents with  . Eye Problem    HPI Brandon Miller is a 53 y.o. male.  HPI   Patient presents for evaluation of difficulty sleeping because when he closes his eyes, he feels like his eyes are twitching.  When he opens his eyes he has "double vision."  Also feels nervousness and tremulousness.  When he tries to walk he feels unsteady as if he is dizzy.  He has mild headache and neck pain at this time.  He denies fever, chills, nausea, vomiting, weakness or paresthesia.  He was here several days ago and diagnosed with possible viral syndrome and hypocalcemia.  He was prescribed calcium but has not gotten the prescription yet.  He works a job Engineer, agricultural.  He was unable to work today.  No prior similar problems.  He takes Adderall and a benzodiazepine for anxiety.  There are no other known modifying factors.  Past Medical History:  Diagnosis Date  . Allergy   . Anemia   . Anxiety   . Arthritis    RA  . Complication of anesthesia    hard to wake up-was told he may have sleep apnea-never tested  . Depression   . Diabetes mellitus without complication ([place])   . Sleep apnea   . Snores     Patient Active Problem List   Diagnosis Date Noted  . DJD (degenerative joint disease) of knee 08/25/2014  . Diabetes mellitus, type 2 ([place]) 10/31/2011  . Hyperglycemia 10/31/2011  . PNA (pneumonia) 10/30/2011  . LEG PAIN, RIGHT 01/20/2010    Past Surgical History:  Procedure Laterality Date  . ELBOW SURGERY Right 2010   Dr. Percell Miller  . HERNIA REPAIR    . KNEE ARTHROSCOPY WITH MEDIAL MENISECTOMY Right 06/11/2013   Procedure: RIGHT KNEE ARTHROSCOPY WITH MEDIAL MENISECTOMY AND CHONDROPLASTY;  Surgeon: Ninetta Lights, MD;  Location: [place];  Service: Orthopedics;  Laterality: Right;  Chondroplasty, loose  bodies, medial menisectomy, lysis of adhesions.  Marland Kitchen KNEE SURGERY Right 2011  . right ankle    . SHOULDER SURGERY Right 2010   Dr. Percell Miller  . TOTAL KNEE ARTHROPLASTY Right 08/25/2014   Procedure: TOTAL KNEE ARTHROPLASTY;  Surgeon: Ninetta Lights, MD;  Location: [place];  Service: Orthopedics;  Laterality: Right;        Home Medications    Prior to Admission medications   Medication Sig Start Date End Date Taking? Authorizing Provider  ALPRAZolam Duanne Moron) 1 MG tablet Take 1-2 mg by mouth at bedtime. For anxiety    Yes [provider]  amphetamine-dextroamphetamine (ADDERALL XR) 25 MG 24 hr capsule Take 25 mg by mouth every morning.   Yes [provider]  buPROPion (WELLBUTRIN XL) 300 MG 24 hr tablet Take 300 mg by mouth daily. 12/21/17  Yes [provider]  calcium-vitamin D 250-100 MG-UNIT tablet Take 1 tablet by mouth 2 (two) times daily. Patient not taking: Reported on 02/04/2018 01/27/18   Ripley Fraise, MD    Family History No family history on file.  Social History Social History   Tobacco Use  . Smoking status: Current Every Day Smoker    Packs/day: 0.50    Years: 25.00    Pack years: 12.50    Types: Cigarettes  . Smokeless tobacco: Never Used  Substance Use Topics  .  Alcohol use: Yes    Comment: occasionally  . Drug use: No     Allergies   Daypro [oxaprozin]; Diclofenac sodium; Indocin [indomethacin]; and Zanaflex [tizanidine hcl]   Review of Systems Review of Systems  All other systems reviewed and are negative.    Physical Exam Updated Vital Signs BP 124/75   Pulse 78   Temp 98.1 F (36.7 C) (Oral)   Resp (!) 24   Ht 6\' 2"  (1.88 m)   Wt 95.3 kg   SpO2 98%   BMI 26.96 kg/m   Physical Exam Vitals signs and nursing note reviewed.  Constitutional:      General: He is in acute distress.     Appearance: Normal appearance. He is well-developed. He is not ill-appearing or diaphoretic.  HENT:     Head: Normocephalic and  atraumatic.     Right Ear: External ear normal.     Left Ear: External ear normal.     Mouth/Throat:     Mouth: Mucous membranes are moist.     Pharynx: No oropharyngeal exudate or posterior oropharyngeal erythema.  Eyes:     Conjunctiva/sclera: Conjunctivae normal.     Pupils: Pupils are equal, round, and reactive to light.  Neck:     Musculoskeletal: Normal range of motion and neck supple.     Trachea: Phonation normal.  Cardiovascular:     Rate and Rhythm: Normal rate and regular rhythm.     Heart sounds: Normal heart sounds.  Pulmonary:     Effort: Pulmonary effort is normal.     Breath sounds: Normal breath sounds.  Abdominal:     Palpations: Abdomen is soft.     Tenderness: There is no abdominal tenderness.  Musculoskeletal: Normal range of motion.  Skin:    General: Skin is warm and dry.  Neurological:     Mental Status: He is alert and oriented to person, place, and time.     Cranial Nerves: No cranial nerve deficit.     Sensory: No sensory deficit.     Motor: No abnormal muscle tone.     Coordination: Coordination normal.     Comments: No dysarthria, or aphasia.  Ataxic movements of the arms, bilaterally.  No truncal ataxia.  No nystagmus.  Psychiatric:        Behavior: Behavior normal.        Thought Content: Thought content normal.        Judgment: Judgment normal.      ED Treatments / Results  Labs (all labs ordered are listed, but only abnormal results are displayed) Labs Reviewed  CBC WITH DIFFERENTIAL/PLATELET - Abnormal; Notable for the following components:      Result Value   Platelets 112 (*)    All other components within normal limits  BASIC METABOLIC PANEL - Abnormal; Notable for the following components:   Sodium 133 (*)    CO2 20 (*)    Glucose, Bld 117 (*)    Calcium 8.3 (*)    All other components within normal limits  ETHANOL    EKG EKG Interpretation  Date/Time:  Tuesday February 04 2018 16:10:05 EST Ventricular Rate:  73 PR  Interval:    QRS Duration: 103 QT Interval:  420 QTC Calculation: 463 R Axis:   71 Text Interpretation:  Sinus rhythm Minimal ST depression, inferior leads since last tracing no significant change Confirmed by Daleen Bo [phone]) on 02/04/2018 6:19:34 PM   Radiology Ct Head Wo Contrast  Result Date: 02/04/2018 CLINICAL DATA:  Double vision EXAM: CT HEAD WITHOUT CONTRAST TECHNIQUE: Contiguous axial images were obtained from the base of the skull through the vertex without intravenous contrast. COMPARISON:  None. FINDINGS: Brain: No evidence of acute infarction, hemorrhage, hydrocephalus, extra-axial collection or mass lesion/mass effect. Vascular: No hyperdense vessel or unexpected calcification. Skull: Normal. Negative for fracture or focal lesion. Sinuses/Orbits: No acute finding. Other: None. IMPRESSION: No acute abnormality noted. Electronically Signed   By: Inez Catalina M.D.   On: 02/04/2018 18:57   Mr Brain Wo Contrast  Result Date: 02/04/2018 CLINICAL DATA:  Headache. Diplopia. Ataxia, stroke suspected. Seizure. EXAM: MRI HEAD WITHOUT CONTRAST TECHNIQUE: Multiplanar, multiecho pulse sequences of the brain and surrounding structures were obtained without intravenous contrast. COMPARISON:  None. FINDINGS: Brain: No acute infarct, hemorrhage, or mass lesion is present. The ventricles are of normal size. No significant white matter lesions are present. No significant extraaxial fluid collection is present. The internal auditory canals are within normal limits. The brainstem and cerebellum are within normal limits. Vascular: Flow is present in the major intracranial arteries. The skull is Skull and upper cervical spine: The craniocervical junction is normal. Upper cervical spine is within normal limits. Marrow signal is unremarkable. Sinuses/Orbits: Left maxillary antrostomy is noted. Paranasal sinuses are clear. There is some fluid in the right mastoid air cells. No obstructing nasopharyngeal  lesion is present. The globes and orbits are within normal limits. IMPRESSION: 1. Normal MRI the brain for age. No acute or focal lesion to explain headache, diplopia, or seizure. 2. Left maxillary sinus surgery. Electronically Signed   By: San Morelle M.D.   On: 02/04/2018 18:21    Procedures .Critical Care Performed by: Daleen Bo, MD Authorized by: Daleen Bo, MD   Critical care provider statement:    Critical care time (minutes):  45   Critical care start time:  02/04/2018 3:55 PM   Critical care end time:  02/04/2018 7:20 PM   Critical care time was exclusive of:  Separately billable procedures and treating other patients   Critical care was necessary to treat or prevent imminent or life-threatening deterioration of the following conditions:  CNS failure or compromise   Critical care was time spent personally by me on the following activities:  Blood draw for specimens, development of treatment plan with patient or surrogate, discussions with consultants, evaluation of patient's response to treatment, examination of patient, obtaining history from patient or surrogate, ordering and performing treatments and interventions, ordering and review of laboratory studies, pulse oximetry, re-evaluation of patient's condition, review of old charts and ordering and review of radiographic studies   (including critical care time)  Medications Ordered in ED Medications  sodium chloride 0.9 % bolus 1,000 mL (1,000 mLs Intravenous New Bag/Given 02/04/18 1709)  calcium gluconate 1 g/ 50 mL sodium chloride IVPB (1,000 mg Intravenous New Bag/Given 02/04/18 1710)  LORazepam (ATIVAN) injection 2 mg (2 mg Intravenous Given 02/04/18 1658)  thiamine (B-1) injection 100 mg (100 mg Intravenous Given 02/04/18 1947)     Initial Impression / Assessment and Plan / ED Course  I have reviewed the triage vital signs and the nursing notes.  Pertinent labs & imaging results that were available during my  care of the patient were reviewed by me and considered in my medical decision making (see chart for details).  Clinical Course as of Feb 05 2024  Tue Feb 04, 2018  1656 Patient currently on MRI able and unable to tolerate procedure.  Ativan ordered for MRI tolerance.   [  EW]  1704 Patient was brought back from MRI, prior to receiving Ativan, because he had a "3-minute seizure."  On arrival back to the ED he is postictal.  He is breathing normally.  Currently he has been placed on monitors.  Will stabilize and order head CT, followed by further treatment as needed.   [EW]  1918 Normal, negative  Ethanol [EW]  1918 CBC with Differential(!) [EW]  1919 Normal  CBC with Differential(!) [EW]  1919 Normal except sodium low, CO2 low, glucose high, calcium low  Basic metabolic panel(!) [EW]    Clinical Course User Index [EW] Daleen Bo, MD     Patient Vitals for the past 24 hrs:  BP Temp Temp src Pulse Resp SpO2 Height Weight  02/04/18 1830 124/75 - - 78 (!) 24 - - -  02/04/18 1714 127/76 - - 98 (!) 22 98 % - -  02/04/18 1620 136/90 - - 61 13 - - -  02/04/18 1524 - - - - - - 6\' 2"  (1.88 m) 95.3 kg  02/04/18 1523 (!) 160/94 98.1 F (36.7 C) Oral 82 18 99 % - -    7:30 PM Reevaluation with update and discussion. After initial assessment and treatment, an updated evaluation reveals patient is alert, calm and has less tremor at this time.  Patient admits to very heavy alcohol use, 8-10 beers almost daily, for 6 months.  Currently, his sister is here with him and relates that the patient has been drinking a lot because of prior sternal turmoil related to loss of job, knee injury, and a failed relationship.  The patient lives alone.  Patient's brother had very serious alcohol abuse, and while he had "DTs."  Patient is willing to stay overnight and begin treatment for alcoholism. Daleen Bo   Medical Decision Making: Tremulousness, with tonic-clonic seizure.  Patient with unspecified eye  disorder, with normal MRI findings.  Patient appears to be having moderately severe alcohol withdrawal symptoms, with possible early DTs symptoms.  As of 7:30 p.m., he is not in active alcohol withdrawal.  Patient would benefit from an overnight observation, with monitoring of CIWA protocol, and initiation of Librium versus Ativan, PRN.  Patient will benefit from treatment for alcoholism including behavioral therapy and peer support.  CRITICAL CARE-yes Performed by: Daleen Bo   Nursing Notes Reviewed/ Care Coordinated Applicable Imaging Reviewed Interpretation of Laboratory Data incorporated into ED treatment   7:57 PM-Consult complete with hospitalist. Patient case explained and discussed.  He agrees to admit patient for further evaluation and treatment. Call ended at 8:20 PM  Plan: Admit    Final Clinical Impressions(s) / ED Diagnoses   Final diagnoses:  Alcohol withdrawal seizure with perceptual disturbance ([place])  Alcoholism ([place])  Tremor  Depression, unspecified depression type    ED Discharge Orders    None       Daleen Bo, MD 02/04/18 2026

## 2018-02-04 NOTE — ED Triage Notes (Signed)
Pt c/o double vision, bilateral numbness to hands that started last night around 9pm. Pt unsteady on his feet in triage, states that he has been feeling bad since his last visit to the er.

## 2018-02-05 ENCOUNTER — Inpatient Hospital Stay (HOSPITAL_COMMUNITY)
Admit: 2018-02-05 | Discharge: 2018-02-05 | Disposition: A | Discharge status: Home / Self Care | Payer: 59 | Attending: Internal Medicine | Admitting: Internal Medicine

## 2018-02-05 DIAGNOSIS — F329 Major depressive disorder, single episode, unspecified: Secondary | ICD-10-CM | POA: Diagnosis not present

## 2018-02-05 DIAGNOSIS — F10232 Alcohol dependence with withdrawal with perceptual disturbance: Secondary | ICD-10-CM | POA: Diagnosis not present

## 2018-02-05 DIAGNOSIS — R740 Nonspecific elevation of levels of transaminase and lactic acid dehydrogenase [LDH]: Secondary | ICD-10-CM | POA: Diagnosis not present

## 2018-02-05 DIAGNOSIS — F32A Depression, unspecified: Secondary | ICD-10-CM | POA: Insufficient documentation

## 2018-02-05 DIAGNOSIS — Z720 Tobacco use: Secondary | ICD-10-CM

## 2018-02-05 DIAGNOSIS — F1019 Alcohol abuse with unspecified alcohol-induced disorder: Secondary | ICD-10-CM | POA: Diagnosis not present

## 2018-02-05 DIAGNOSIS — R7401 Elevation of levels of liver transaminase levels: Secondary | ICD-10-CM

## 2018-02-05 LAB — CBC
HCT: 41.6 % (ref 39.0–52.0)
Hemoglobin: 14.3 g/dL (ref 13.0–17.0)
MCH: 31.1 pg (ref 26.0–34.0)
MCHC: 34.4 g/dL (ref 30.0–36.0)
MCV: 90.4 fL (ref 80.0–100.0)
NRBC: 0 % (ref 0.0–0.2)
Platelets: 94 10*3/uL — ABNORMAL LOW (ref 150–400)
RBC: 4.6 MIL/uL (ref 4.22–5.81)
RDW: 12.1 % (ref 11.5–15.5)
WBC: 6.4 10*3/uL (ref 4.0–10.5)

## 2018-02-05 LAB — MRSA PCR SCREENING: MRSA by PCR: NEGATIVE

## 2018-02-05 LAB — COMPREHENSIVE METABOLIC PANEL
ALT: 53 U/L — ABNORMAL HIGH (ref 0–44)
AST: 82 U/L — AB (ref 15–41)
Albumin: 3.7 g/dL (ref 3.5–5.0)
Alkaline Phosphatase: 57 U/L (ref 38–126)
Anion gap: 7 (ref 5–15)
BUN: 10 mg/dL (ref 6–20)
CO2: 22 mmol/L (ref 22–32)
Calcium: 8.2 mg/dL — ABNORMAL LOW (ref 8.9–10.3)
Chloride: 108 mmol/L (ref 98–111)
Creatinine, Ser: 0.76 mg/dL (ref 0.61–1.24)
GFR calc Af Amer: >60 mL/min (ref >60)
GFR calc non Af Amer: >60 mL/min (ref >60)
Glucose, Bld: 99 mg/dL (ref 70–99)
Potassium: 3.5 mmol/L (ref 3.5–5.1)
Sodium: 137 mmol/L (ref 135–145)
TOTAL PROTEIN: 6.7 g/dL (ref 6.5–8.1)
Total Bilirubin: 1.7 mg/dL — ABNORMAL HIGH (ref 0.3–1.2)

## 2018-02-05 LAB — CK: Total CK: 100 U/L (ref 49–397)

## 2018-02-05 MED ORDER — POTASSIUM CHLORIDE CRYS ER 20 MEQ PO TBCR
20.0000 meq | EXTENDED_RELEASE_TABLET | Freq: Once | ORAL | Status: AC
  Administered 2018-02-05: 20 meq via ORAL
  Filled 2018-02-05: qty 1

## 2018-02-05 MED ORDER — VITAMIN B-1 100 MG PO TABS
100.0000 mg | ORAL_TABLET | Freq: Every day | ORAL | Status: DC
  Administered 2018-02-05 – 2018-02-06 (×2): 100 mg via ORAL
  Filled 2018-02-05 (×2): qty 1

## 2018-02-05 MED ORDER — ACETAMINOPHEN 325 MG PO TABS: 650.0000 mg | ORAL_TABLET | Freq: Four times a day (QID) | ORAL | Status: DC | PRN

## 2018-02-05 MED ORDER — ZOLPIDEM TARTRATE 5 MG PO TABS
5.0000 mg | ORAL_TABLET | Freq: Once | ORAL | Status: AC
  Administered 2018-02-05: 5 mg via ORAL
  Filled 2018-02-05: qty 1

## 2018-02-05 MED ORDER — CALCIUM CARBONATE-VITAMIN D 500-200 MG-UNIT PO TABS
1.0000 | ORAL_TABLET | Freq: Two times a day (BID) | ORAL | Status: DC
  Administered 2018-02-05 – 2018-02-06 (×3): 1 via ORAL
  Filled 2018-02-05 (×8): qty 1

## 2018-02-05 MED ORDER — NICOTINE 14 MG/24HR TD PT24
14.0000 mg | MEDICATED_PATCH | Freq: Every day | TRANSDERMAL | Status: DC
  Administered 2018-02-05 – 2018-02-06 (×2): 14 mg via TRANSDERMAL
  Filled 2018-02-05 (×2): qty 1

## 2018-02-05 MED ORDER — ZOLPIDEM TARTRATE 5 MG PO TABS
5.0000 mg | ORAL_TABLET | Freq: Every evening | ORAL | Status: DC | PRN
  Administered 2018-02-05: 5 mg via ORAL
  Filled 2018-02-05: qty 1

## 2018-02-05 MED ORDER — ALPRAZOLAM 0.5 MG PO TABS: 1.0000 mg | ORAL_TABLET | Freq: Two times a day (BID) | ORAL | Status: DC | PRN

## 2018-02-05 NOTE — Progress Notes (Signed)
EEG completed, results pending. 

## 2018-02-05 NOTE — Procedures (Signed)
History: 53 year old male being evaluated for withdrawal  Sedation: Lorazepam 3 hours before EEG  Technique: This is a 21 channel routine scalp EEG performed at the bedside with bipolar and monopolar montages arranged in accordance to the international 10/20 system of electrode placement. One channel was dedicated to EKG recording.    Background: The background consists of intermixed alpha and beta activities. There is a well defined posterior dominant rhythm of 12 hz that attenuates with eye opening.  There is also a slow variant posterior dominant rhythm seen at times.  There is anterior shifting of the posterior dominant rhythm associated with sleep.  Sleep is briefly recorded with normal appearing structures.   Photic stimulation: Physiologic driving is not performed  EEG Abnormalities: None  Clinical Interpretation: This normal EEG is recorded in the waking and sleep state.   There were multiple notes of isolated face, arm and leg twitching by the technician, these were not associated with any electrographic change and this study does not support an epileptic nature to these movements.  There was no seizure or seizure predisposition recorded on this study. Please note that lack of epileptiform activity on EEG does not preclude the possibility of epilepsy.   Roland Rack, MD [facility] [phone]  If 7pm- 7am, please page neurology on call as listed in [place].

## 2018-02-05 NOTE — Progress Notes (Signed)
PROGRESS NOTE  Brandon Miller KXF:[id number] DOB: 1965/08/29 DOA: 02/04/2018 PCP: Celene Squibb, MD  Brief History:  53 year old male with a history of diet-controlled diabetes mellitus, anxiety/depression presenting with tremulousness and nervousness for approximately 1 to 2 days.  The patient stated that he was feeling like his eyes were "twitching" when he was going to sleep.  When he opened his eyes, he felt like he had double vision.  The patient was in the emergency department on 01/27/2018.  He was discharged home in stable condition with a URI and hypocalcemia.  He had some subjective fevers and chills.  He denied any vomiting, diarrhea, chest pain, shortness breath, coughing, hemoptysis.  He had had some intermittent headaches but did not have any neck pain or confusion.  There is no abdominal pain, dysuria, hematuria.  Upon presentation, the patient was afebrile hemodynamically stable saturating 99% on room air.  MRI of the brain was obtained.  In route to MRI, the patient had a seizure in the ED biting his tongue.  This was thought to be from alcohol withdrawal.  As result, the patient was admitted for further evaluation  Assessment/Plan: Alcohol withdrawal/Xanax withdrawal seizure/alcohol withdrawal -Restart alprazolam -Alcohol withdrawal protocol -The [place] has been queried for this patient--he has been prescribed alprazolam 1 mg, #60 on a monthly basis by one provider; his Adderall refill was 12/17/2017 -Continue thiamine -Discontinue Wellbutrin -EEG -Patient continues to have a degree of nervousness and tremulousness  Alcohol abuse -Cessation discussed -Last drank alcohol 02/02/2018 -Alcohol withdrawal protocol  Hypocalcemia -Start calcium supplementation -Check vitamin D  Transaminasemia -Secondary to alcohol abuse/alcohol induced hepatitis -No abdominal pain      Disposition Plan:   Home 1/16 if stable  Family  Communication:  No Family at bedside  Consultants:  none  Code Status:  FULL   DVT Prophylaxis:   [place] Lovenox   Procedures: As Listed in Progress Note Above  Antibiotics: None      Subjective: Patient still feels a little anxious.  He denies any fever, chills, headache, chest pain, shortness breath, nausea, vomiting, diarrhea, bone pain, dysuria, hematuria.  There is no headache or neck pain.  Objective: Vitals:   02/04/18 2346 02/04/18 2357 02/05/18 0625 02/05/18 0736  BP: 128/72 128/72 129/77 126/76  Pulse: 67 67 79 70  Resp:  (!) 22 19 20   Temp:      TempSrc:      SpO2:  99% 98% 99%  Weight:      Height:       No intake or output data in the 24 hours ending 02/05/18 0800 Weight change:  Exam:   General:  Pt is alert, follows commands appropriately, not in acute distress  HEENT: No icterus, No thrush, No neck mass, /AT  Cardiovascular: RRR, S1/S2, no rubs, no gallops  Respiratory: Diminished breath sounds but clear to auscultation.  No wheezing.  Good air movement.  Abdomen: Soft/+BS, non tender, non distended, no guarding  Extremities: No edema, No lymphangitis, No petechiae, No rashes, no synovitis  Neuro:  CN II-XII intact, strength 4/5 in RUE, RLE, strength 4/5 LUE, LLE; sensation intact bilateral; no dysmetria; babinski equivocal     Data Reviewed: I have personally reviewed following labs and imaging studies Basic Metabolic Panel: Recent Labs  Lab 02/04/18 1543 02/05/18 0548  NA 133* 137  K 3.6 3.5  CL 105 108  CO2 20* 22  GLUCOSE 117* 99  BUN 10 10  CREATININE 0.73 0.76  CALCIUM 8.3* 8.2*  MG 2.1  --    Liver Function Tests: Recent Labs  Lab 02/05/18 0548  AST 82*  ALT 53*  ALKPHOS 57  BILITOT 1.7*  PROT 6.7  ALBUMIN 3.7   No results for input(s): LIPASE, AMYLASE in the last 168 hours. No results for input(s): AMMONIA in the last 168 hours. Coagulation Profile: No results for input(s): INR, PROTIME in the last 168  hours. CBC: Recent Labs  Lab 02/04/18 1543 02/05/18 0548  WBC 6.4 6.4  NEUTROABS 4.6  --   HGB 15.2 14.3  HCT 44.1 41.6  MCV 89.5 90.4  PLT 112* 94*   Cardiac Enzymes: Recent Labs  Lab 02/04/18 1543  CKTOTAL 100   BNP: Invalid input(s): POCBNP CBG: No results for input(s): GLUCAP in the last 168 hours. HbA1C: No results for input(s): HGBA1C in the last 72 hours. Urine analysis: No results found for: COLORURINE, APPEARANCEUR, LABSPEC, PHURINE, GLUCOSEU, HGBUR, BILIRUBINUR, KETONESUR, PROTEINUR, UROBILINOGEN, NITRITE, LEUKOCYTESUR Sepsis Labs: @LABRCNTIP (procalcitonin:4,lacticidven:4) )No results found for this or any previous visit (from the past 240 hour(s)).   Scheduled Meds: . amphetamine-dextroamphetamine  25 mg Oral BH-q7a   Continuous Infusions:  Procedures/Studies: Ct Head Wo Contrast  Result Date: 02/04/2018 CLINICAL DATA:  Double vision EXAM: CT HEAD WITHOUT CONTRAST TECHNIQUE: Contiguous axial images were obtained from the base of the skull through the vertex without intravenous contrast. COMPARISON:  None. FINDINGS: Brain: No evidence of acute infarction, hemorrhage, hydrocephalus, extra-axial collection or mass lesion/mass effect. Vascular: No hyperdense vessel or unexpected calcification. Skull: Normal. Negative for fracture or focal lesion. Sinuses/Orbits: No acute finding. Other: None. IMPRESSION: No acute abnormality noted. Electronically Signed   By: Inez Catalina M.D.   On: 02/04/2018 18:57   Mr Brain Wo Contrast  Result Date: 02/04/2018 CLINICAL DATA:  Headache. Diplopia. Ataxia, stroke suspected. Seizure. EXAM: MRI HEAD WITHOUT CONTRAST TECHNIQUE: Multiplanar, multiecho pulse sequences of the brain and surrounding structures were obtained without intravenous contrast. COMPARISON:  None. FINDINGS: Brain: No acute infarct, hemorrhage, or mass lesion is present. The ventricles are of normal size. No significant white matter lesions are present. No significant  extraaxial fluid collection is present. The internal auditory canals are within normal limits. The brainstem and cerebellum are within normal limits. Vascular: Flow is present in the major intracranial arteries. The skull is Skull and upper cervical spine: The craniocervical junction is normal. Upper cervical spine is within normal limits. Marrow signal is unremarkable. Sinuses/Orbits: Left maxillary antrostomy is noted. Paranasal sinuses are clear. There is some fluid in the right mastoid air cells. No obstructing nasopharyngeal lesion is present. The globes and orbits are within normal limits. IMPRESSION: 1. Normal MRI the brain for age. No acute or focal lesion to explain headache, diplopia, or seizure. 2. Left maxillary sinus surgery. Electronically Signed   By: San Morelle M.D.   On: 02/04/2018 18:21    Orson Eva, DO  [facility] Pager [phone]  If 7PM-7AM, please contact night-coverage [email or web address] Password [email or web address] 02/05/2018, 8:00 AM   LOS: 1 day

## 2018-02-05 NOTE — ED Notes (Signed)
Pt reports last time he drank alcohol was Sunday. He drank 6 beers

## 2018-02-06 DIAGNOSIS — F1019 Alcohol abuse with unspecified alcohol-induced disorder: Secondary | ICD-10-CM | POA: Diagnosis not present

## 2018-02-06 DIAGNOSIS — F329 Major depressive disorder, single episode, unspecified: Secondary | ICD-10-CM | POA: Diagnosis not present

## 2018-02-06 DIAGNOSIS — F10232 Alcohol dependence with withdrawal with perceptual disturbance: Secondary | ICD-10-CM | POA: Diagnosis not present

## 2018-02-06 DIAGNOSIS — S01512A Laceration without foreign body of oral cavity, initial encounter: Secondary | ICD-10-CM | POA: Diagnosis not present

## 2018-02-06 DIAGNOSIS — F10932 Alcohol use, unspecified with withdrawal with perceptual disturbance: Secondary | ICD-10-CM

## 2018-02-06 DIAGNOSIS — Z720 Tobacco use: Secondary | ICD-10-CM | POA: Diagnosis not present

## 2018-02-06 LAB — HIV ANTIBODY (ROUTINE TESTING W REFLEX): HIV SCREEN 4TH GENERATION: NONREACTIVE

## 2018-02-06 LAB — COMPREHENSIVE METABOLIC PANEL
ALT: 55 U/L — ABNORMAL HIGH (ref 0–44)
ANION GAP: 7 (ref 5–15)
AST: 74 U/L — ABNORMAL HIGH (ref 15–41)
Albumin: 3.7 g/dL (ref 3.5–5.0)
Alkaline Phosphatase: 53 U/L (ref 38–126)
BUN: 9 mg/dL (ref 6–20)
CO2: 25 mmol/L (ref 22–32)
Calcium: 8.7 mg/dL — ABNORMAL LOW (ref 8.9–10.3)
Chloride: 106 mmol/L (ref 98–111)
Creatinine, Ser: 0.68 mg/dL (ref 0.61–1.24)
GFR calc Af Amer: >60 mL/min (ref >60)
GFR calc non Af Amer: >60 mL/min (ref >60)
Glucose, Bld: 87 mg/dL (ref 70–99)
POTASSIUM: 3.2 mmol/L — AB (ref 3.5–5.1)
Sodium: 138 mmol/L (ref 135–145)
Total Bilirubin: 2.4 mg/dL — ABNORMAL HIGH (ref 0.3–1.2)
Total Protein: 6.8 g/dL (ref 6.5–8.1)

## 2018-02-06 MED ORDER — POTASSIUM CHLORIDE CRYS ER 20 MEQ PO TBCR
40.0000 meq | EXTENDED_RELEASE_TABLET | Freq: Once | ORAL | Status: AC
  Administered 2018-02-06: 40 meq via ORAL
  Filled 2018-02-06: qty 2

## 2018-02-06 MED ORDER — THIAMINE HCL 100 MG PO TABS: 100.0000 mg | ORAL_TABLET | Freq: Every day | ORAL | Status: DC

## 2018-02-06 NOTE — Discharge Summary (Signed)
Physician Discharge Summary  Brandon Miller NWG:[id number] DOB: [birth year] DOA: 02/04/2018  PCP: Celene Squibb, MD  Admit date: 02/04/2018 Discharge date: 02/06/2018  Admitted From: Home Disposition:  Home   Recommendations for Outpatient Follow-up:  1. Follow up with PCP in 1-2 weeks 2. Please obtain BMP/CBC in one week    Discharge Condition: Stable CODE STATUS:FULL Diet recommendation:  Regular   Brief/Interim Summary: 53 year old male with a history of diet-controlled diabetes mellitus, anxiety/depression presenting with tremulousness and nervousness for approximately 1 to 2 days.  The patient stated that he was feeling like his eyes were "twitching" when he was going to sleep.  When he opened his eyes, he felt like he had double vision.  The patient was in the emergency department on 01/27/2018.  He was discharged home in stable condition with a URI and hypocalcemia.  He had some subjective fevers and chills.  He denied any vomiting, diarrhea, chest pain, shortness breath, coughing, hemoptysis.  He had had some intermittent headaches but did not have any neck pain or confusion.  There is no abdominal pain, dysuria, hematuria.  Upon presentation, the patient was afebrile hemodynamically stable saturating 99% on room air.  MRI of the brain was obtained.  In route to MRI, the patient had a seizure in the ED biting his tongue.  This was thought to be from alcohol withdrawal.  As result, the patient was admitted for further evaluation  Discharge Diagnoses:  Alcohol withdrawal/Xanax withdrawal seizure/alcohol withdrawal -Restart alprazolam -Alcohol withdrawal protocol -The [place] Controlled Substance Reporting System has been queried for this patient--he has been prescribed alprazolam 1 mg, #60 on a monthly basis by one provider; his Adderall refill was 12/17/2017--will not restart Adderall at this time -Continue thiamine -Discontinue Wellbutrin as this lowers seizure  threshold -EEG--negative for any epileptiform discharges; notation was made of isolated face, arm and leg twitching by the technician, these were not associated with any electrographic change and this study does not support an epileptic nature to these movements -Patient did have some confusion early morning 02/06/2018 (0130) likely secondary to Ambien -Later in the morning 02/06/2018, mental status returned back to baseline with any further signs of withdrawal -No further seizures during the hospitalization -resources provided for alcohol rehab  Alcohol abuse -Cessation discussed -Last drank alcohol 02/02/2018 -Alcohol withdrawal protocol  Hypocalcemia -Start calcium supplementation  Transaminasemia -Secondary to alcohol abuse/alcohol induced hepatitis -No abdominal pain -Improving  Hypokalemia -Repleted -Magnesium 2.1   Discharge Instructions   Allergies as of 02/06/2018      Reactions   Daypro [oxaprozin] Other (See Comments)   'tears his stomach up"   Diclofenac Sodium Other (See Comments)   dizzy   Indocin [indomethacin] Nausea And Vomiting   Zanaflex [tizanidine Hcl] Swelling      Medication List    STOP taking these medications   buPROPion 300 MG 24 hr tablet Commonly known as:  WELLBUTRIN XL     TAKE these medications   ALPRAZolam 1 MG tablet Commonly known as:  XANAX Take 1-2 mg by mouth at bedtime. For anxiety   amphetamine-dextroamphetamine 25 MG 24 hr capsule Commonly known as:  ADDERALL XR Take 25 mg by mouth every morning.   calcium-vitamin D 250-100 MG-UNIT tablet Take 1 tablet by mouth 2 (two) times daily.   thiamine 100 MG tablet Take 1 tablet (100 mg total) by mouth daily.       Allergies  Allergen Reactions  . Daypro [Oxaprozin] Other (See Comments)    'tears  his stomach up"  . Diclofenac Sodium Other (See Comments)    dizzy  . Indocin [Indomethacin] Nausea And Vomiting  . Zanaflex [Tizanidine Hcl] Swelling     Consultations:  none   Procedures/Studies: Ct Head Wo Contrast  Result Date: 02/04/2018 CLINICAL DATA:  Double vision EXAM: CT HEAD WITHOUT CONTRAST TECHNIQUE: Contiguous axial images were obtained from the base of the skull through the vertex without intravenous contrast. COMPARISON:  None. FINDINGS: Brain: No evidence of acute infarction, hemorrhage, hydrocephalus, extra-axial collection or mass lesion/mass effect. Vascular: No hyperdense vessel or unexpected calcification. Skull: Normal. Negative for fracture or focal lesion. Sinuses/Orbits: No acute finding. Other: None. IMPRESSION: No acute abnormality noted. Electronically Signed   By: Inez Catalina M.D.   On: 02/04/2018 18:57   Mr Brain Wo Contrast  Result Date: 02/04/2018 CLINICAL DATA:  Headache. Diplopia. Ataxia, stroke suspected. Seizure. EXAM: MRI HEAD WITHOUT CONTRAST TECHNIQUE: Multiplanar, multiecho pulse sequences of the brain and surrounding structures were obtained without intravenous contrast. COMPARISON:  None. FINDINGS: Brain: No acute infarct, hemorrhage, or mass lesion is present. The ventricles are of normal size. No significant white matter lesions are present. No significant extraaxial fluid collection is present. The internal auditory canals are within normal limits. The brainstem and cerebellum are within normal limits. Vascular: Flow is present in the major intracranial arteries. The skull is Skull and upper cervical spine: The craniocervical junction is normal. Upper cervical spine is within normal limits. Marrow signal is unremarkable. Sinuses/Orbits: Left maxillary antrostomy is noted. Paranasal sinuses are clear. There is some fluid in the right mastoid air cells. No obstructing nasopharyngeal lesion is present. The globes and orbits are within normal limits. IMPRESSION: 1. Normal MRI the brain for age. No acute or focal lesion to explain headache, diplopia, or seizure. 2. Left maxillary sinus surgery. Electronically  Signed   By: San Morelle M.D.   On: 02/04/2018 18:21        Discharge Exam: Vitals:   02/06/18 0600 02/06/18 0752  BP: 116/72   Pulse:    Resp: 16   Temp:  98.1 F (36.7 C)  SpO2:     Vitals:   02/06/18 0400 02/06/18 0500 02/06/18 0600 02/06/18 0752  BP: 121/72 107/66 116/72   Pulse: (!) 52 (!) 49    Resp: 19 17 16    Temp: (!) 97.4 F (36.3 C)   98.1 F (36.7 C)  TempSrc: Oral   Oral  SpO2: 98% 90%    Weight:  92.6 kg    Height:        General: Pt is alert, awake, not in acute distress Cardiovascular: RRR, S1/S2 +, no rubs, no gallops Respiratory: CTA bilaterally, no wheezing, no rhonchi Abdominal: Soft, NT, ND, bowel sounds + Extremities: no edema, no cyanosis   The results of significant diagnostics from this hospitalization (including imaging, microbiology, ancillary and laboratory) are listed below for reference.    Significant Diagnostic Studies: Ct Head Wo Contrast  Result Date: 02/04/2018 CLINICAL DATA:  Double vision EXAM: CT HEAD WITHOUT CONTRAST TECHNIQUE: Contiguous axial images were obtained from the base of the skull through the vertex without intravenous contrast. COMPARISON:  None. FINDINGS: Brain: No evidence of acute infarction, hemorrhage, hydrocephalus, extra-axial collection or mass lesion/mass effect. Vascular: No hyperdense vessel or unexpected calcification. Skull: Normal. Negative for fracture or focal lesion. Sinuses/Orbits: No acute finding. Other: None. IMPRESSION: No acute abnormality noted. Electronically Signed   By: Inez Catalina M.D.   On: 02/04/2018 18:57   Mr  Brain Wo Contrast  Result Date: 02/04/2018 CLINICAL DATA:  Headache. Diplopia. Ataxia, stroke suspected. Seizure. EXAM: MRI HEAD WITHOUT CONTRAST TECHNIQUE: Multiplanar, multiecho pulse sequences of the brain and surrounding structures were obtained without intravenous contrast. COMPARISON:  None. FINDINGS: Brain: No acute infarct, hemorrhage, or mass lesion is present. The  ventricles are of normal size. No significant white matter lesions are present. No significant extraaxial fluid collection is present. The internal auditory canals are within normal limits. The brainstem and cerebellum are within normal limits. Vascular: Flow is present in the major intracranial arteries. The skull is Skull and upper cervical spine: The craniocervical junction is normal. Upper cervical spine is within normal limits. Marrow signal is unremarkable. Sinuses/Orbits: Left maxillary antrostomy is noted. Paranasal sinuses are clear. There is some fluid in the right mastoid air cells. No obstructing nasopharyngeal lesion is present. The globes and orbits are within normal limits. IMPRESSION: 1. Normal MRI the brain for age. No acute or focal lesion to explain headache, diplopia, or seizure. 2. Left maxillary sinus surgery. Electronically Signed   By: San Morelle M.D.   On: 02/04/2018 18:21     Microbiology: Recent Results (from the past 240 hour(s))  MRSA PCR Screening     Status: None   Collection Time: 02/05/18  9:10 AM  Result Value Ref Range Status   MRSA by PCR NEGATIVE NEGATIVE Final    Comment:        The GeneXpert MRSA Assay (FDA approved for NASAL specimens only), is one component of a comprehensive MRSA colonization surveillance program. It is not intended to diagnose MRSA infection nor to guide or monitor treatment for MRSA infections. Performed at [facility], [address], [place], [place] [address]      Labs: Basic Metabolic Panel: Recent Labs  Lab 02/04/18 1543 02/05/18 0548 02/06/18 0513  NA 133* 137 138  K 3.6 3.5 3.2*  CL 105 108 106  CO2 20* 22 25  GLUCOSE 117* 99 87  BUN 10 10 9   CREATININE 0.73 0.76 0.68  CALCIUM 8.3* 8.2* 8.7*  MG 2.1  --   --    Liver Function Tests: Recent Labs  Lab 02/05/18 0548 02/06/18 0513  AST 82* 74*  ALT 53* 55*  ALKPHOS 57 53  BILITOT 1.7* 2.4*  PROT 6.7 6.8  ALBUMIN 3.7 3.7   No results for  input(s): LIPASE, AMYLASE in the last 168 hours. No results for input(s): AMMONIA in the last 168 hours. CBC: Recent Labs  Lab 02/04/18 1543 02/05/18 0548  WBC 6.4 6.4  NEUTROABS 4.6  --   HGB 15.2 14.3  HCT 44.1 41.6  MCV 89.5 90.4  PLT 112* 94*   Cardiac Enzymes: Recent Labs  Lab 02/04/18 1543  CKTOTAL 100   BNP: Invalid input(s): POCBNP CBG: No results for input(s): GLUCAP in the last 168 hours.  Time coordinating discharge:  36 minutes  Signed:  Orson Eva, DO [facility] Pager: [phone] 02/06/2018, 10:54 AM

## 2018-02-06 NOTE — Progress Notes (Signed)
Pt has done well throughout the night. He unhooked all his leads at one point as was very fidgety but was easily redirected. Another time he unhooked all leads and was standing in doorway looking a little confused, constantly wiping his head and wringing his hands. His pillow case and gown was soaked in sweat. Reoriented patient and made sure bed alarm was on. He has since slept.

## 2018-02-06 NOTE — Progress Notes (Signed)
AVS paperwork reviewed with pt. Pt to pick up thiamine OTC at pharmacy of choice. PIV x2 removed without difficulty. Pt gathered belongings and walk to private vehicle with sister.

## 2018-02-06 NOTE — Clinical Social Work Note (Signed)
LCSW asked by MD to provide pt with ETOH treatment resource information. Met with pt to assess. Per pt, he has been in addiction treatment in [place] in the past and he did have success with it. Discussed pt's current addiction concerns and offered emotional support as well as verbal and written information on treatment options. Pt states that he knows everyone wants him to quit drinking and he states he is going to strongly consider getting himself into treatment. Encouragement offered. Pt did not have any additional questions or concerns for LCSW at this time.

## 2018-04-04 DIAGNOSIS — M62830 Muscle spasm of back: Secondary | ICD-10-CM | POA: Diagnosis not present

## 2018-05-19 DIAGNOSIS — L03113 Cellulitis of right upper limb: Secondary | ICD-10-CM | POA: Diagnosis not present

## 2018-05-19 DIAGNOSIS — L03019 Cellulitis of unspecified finger: Secondary | ICD-10-CM | POA: Diagnosis not present

## 2018-08-14 ENCOUNTER — Emergency Department (HOSPITAL_COMMUNITY)
Admission: EM | Admit: 2018-08-14 | Discharge: 2018-08-15 | Disposition: A | Discharge status: Other Acute Inpatient Hospital | Payer: 59 | Source: Home / Self Care | Attending: Emergency Medicine | Admitting: Emergency Medicine

## 2018-08-14 ENCOUNTER — Other Ambulatory Visit: Payer: Self-pay

## 2018-08-14 ENCOUNTER — Encounter (HOSPITAL_COMMUNITY): Payer: Self-pay | Admitting: Emergency Medicine

## 2018-08-14 DIAGNOSIS — F17210 Nicotine dependence, cigarettes, uncomplicated: Secondary | ICD-10-CM | POA: Insufficient documentation

## 2018-08-14 DIAGNOSIS — R45851 Suicidal ideations: Secondary | ICD-10-CM | POA: Insufficient documentation

## 2018-08-14 DIAGNOSIS — F1029 Alcohol dependence with unspecified alcohol-induced disorder: Secondary | ICD-10-CM | POA: Insufficient documentation

## 2018-08-14 DIAGNOSIS — Z96651 Presence of right artificial knee joint: Secondary | ICD-10-CM | POA: Insufficient documentation

## 2018-08-14 DIAGNOSIS — E860 Dehydration: Secondary | ICD-10-CM | POA: Insufficient documentation

## 2018-08-14 DIAGNOSIS — Z79899 Other long term (current) drug therapy: Secondary | ICD-10-CM | POA: Insufficient documentation

## 2018-08-14 DIAGNOSIS — K7010 Alcoholic hepatitis without ascites: Secondary | ICD-10-CM

## 2018-08-14 DIAGNOSIS — Y908 Blood alcohol level of 240 mg/100 ml or more: Secondary | ICD-10-CM | POA: Insufficient documentation

## 2018-08-14 DIAGNOSIS — Z046 Encounter for general psychiatric examination, requested by authority: Secondary | ICD-10-CM | POA: Insufficient documentation

## 2018-08-14 DIAGNOSIS — Z20828 Contact with and (suspected) exposure to other viral communicable diseases: Secondary | ICD-10-CM | POA: Insufficient documentation

## 2018-08-14 DIAGNOSIS — E119 Type 2 diabetes mellitus without complications: Secondary | ICD-10-CM | POA: Insufficient documentation

## 2018-08-14 DIAGNOSIS — F329 Major depressive disorder, single episode, unspecified: Secondary | ICD-10-CM | POA: Insufficient documentation

## 2018-08-14 LAB — COMPREHENSIVE METABOLIC PANEL
ALT: 557 U/L — ABNORMAL HIGH (ref 0–44)
AST: 1924 U/L — ABNORMAL HIGH (ref 15–41)
Albumin: 4 g/dL (ref 3.5–5.0)
Alkaline Phosphatase: 130 U/L — ABNORMAL HIGH (ref 38–126)
Anion gap: 21 — ABNORMAL HIGH (ref 5–15)
BUN: 9 mg/dL (ref 6–20)
CO2: 20 mmol/L — ABNORMAL LOW (ref 22–32)
Calcium: 8.6 mg/dL — ABNORMAL LOW (ref 8.9–10.3)
Chloride: 99 mmol/L (ref 98–111)
Creatinine, Ser: 0.99 mg/dL (ref 0.61–1.24)
GFR calc Af Amer: >60 mL/min (ref >60)
GFR calc non Af Amer: >60 mL/min (ref >60)
Glucose, Bld: 137 mg/dL — ABNORMAL HIGH (ref 70–99)
Potassium: 3.3 mmol/L — ABNORMAL LOW (ref 3.5–5.1)
Sodium: 140 mmol/L (ref 135–145)
Total Bilirubin: 1.9 mg/dL — ABNORMAL HIGH (ref 0.3–1.2)
Total Protein: 7.5 g/dL (ref 6.5–8.1)

## 2018-08-14 LAB — CBC
HCT: 52.4 % — ABNORMAL HIGH (ref 39.0–52.0)
Hemoglobin: 17.9 g/dL — ABNORMAL HIGH (ref 13.0–17.0)
MCH: 31.9 pg (ref 26.0–34.0)
MCHC: 34.2 g/dL (ref 30.0–36.0)
MCV: 93.2 fL (ref 80.0–100.0)
Platelets: 176 10*3/uL (ref 150–400)
RBC: 5.62 MIL/uL (ref 4.22–5.81)
RDW: 11.9 % (ref 11.5–15.5)
WBC: 5.6 10*3/uL (ref 4.0–10.5)
nRBC: 0 % (ref 0.0–0.2)

## 2018-08-14 LAB — SALICYLATE LEVEL: Salicylate Lvl: <7 mg/dL (ref 2.8–30.0)

## 2018-08-14 LAB — ACETAMINOPHEN LEVEL: Acetaminophen (Tylenol), Serum: <10 ug/mL — ABNORMAL LOW (ref 10–30)

## 2018-08-14 LAB — ETHANOL: Alcohol, Ethyl (B): 288 mg/dL — ABNORMAL HIGH (ref <10)

## 2018-08-14 MED ORDER — LORAZEPAM 2 MG/ML IJ SOLN: 0.0000 mg | Freq: Two times a day (BID) | INTRAMUSCULAR | Status: DC

## 2018-08-14 MED ORDER — LORAZEPAM 1 MG PO TABS: 0.0000 mg | ORAL_TABLET | Freq: Four times a day (QID) | ORAL | Status: DC

## 2018-08-14 MED ORDER — SODIUM CHLORIDE 0.9 % IV BOLUS (SEPSIS)
2000.0000 mL | Freq: Once | INTRAVENOUS | Status: AC
  Administered 2018-08-15: 2000 mL via INTRAVENOUS

## 2018-08-14 MED ORDER — NICOTINE 21 MG/24HR TD PT24
21.0000 mg | MEDICATED_PATCH | Freq: Every day | TRANSDERMAL | Status: DC
  Filled 2018-08-14: qty 1

## 2018-08-14 MED ORDER — VITAMIN B-1 100 MG PO TABS: 100.0000 mg | ORAL_TABLET | Freq: Every day | ORAL | Status: DC

## 2018-08-14 MED ORDER — THIAMINE HCL 100 MG/ML IJ SOLN: 100.0000 mg | Freq: Every day | INTRAMUSCULAR | Status: DC

## 2018-08-14 MED ORDER — LORAZEPAM 1 MG PO TABS: 0.0000 mg | ORAL_TABLET | Freq: Two times a day (BID) | ORAL | Status: DC

## 2018-08-14 MED ORDER — LORAZEPAM 2 MG/ML IJ SOLN
0.0000 mg | Freq: Four times a day (QID) | INTRAMUSCULAR | Status: DC
  Administered 2018-08-15: 1 mg via INTRAVENOUS
  Filled 2018-08-14: qty 1

## 2018-08-14 NOTE — BH Assessment (Signed)
[facility] Assessment Progress Note   Patient is in waiting room.  No labs drawn.  Has not yet been seen by provider.  TTS to see patient when he is in room, seen by provider and labs are drawn.
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
## 2018-08-14 NOTE — ED Provider Notes (Signed)
[facility] DEPT Provider Note   CSN: [id number] Arrival date & time: 08/14/18  2131     History   Chief Complaint Chief Complaint  Patient presents with  . Suicidal    HPI Brandon Miller is a 53 y.o. male.     Patient is a 53 year old male with past medical history of anxiety, depression, arthritis.  He presents today for evaluation of suicidal ideation.  He was brought here by his sister after he told her he was thinking about "eating a bullet".  Patient reports feeling depressed over his occupation and lack thereof, finances, and relationship with family.  Patient also tells me that he is an alcoholic and has been drinking 24 beers per day for the past 3 years.  The history is provided by the patient.    Past Medical History:  Diagnosis Date  . Allergy   . Anemia   . Anxiety   . Arthritis    RA  . Complication of anesthesia    hard to wake up-was told he may have sleep apnea-never tested  . Depression   . Diabetes mellitus without complication ([place])   . Sleep apnea   . Snores     Patient Active Problem List   Diagnosis Date Noted  . Alcohol withdrawal seizure with perceptual disturbance ([place])   . Alcohol abuse with unspecified alcohol-induced disorder ([place]) 02/05/2018  . Transaminasemia 02/05/2018  . Tobacco abuse 02/05/2018  . Depression   . Alcohol withdrawal seizure without complication ([place]) 40/98/1191  . DJD (degenerative joint disease) of knee 08/25/2014  . Diabetes mellitus, type 2 ([place]) 10/31/2011  . Hyperglycemia 10/31/2011  . PNA (pneumonia) 10/30/2011  . LEG PAIN, RIGHT 01/20/2010    Past Surgical History:  Procedure Laterality Date  . ELBOW SURGERY Right 2010   Dr. Percell Miller  . HERNIA REPAIR    . KNEE ARTHROSCOPY WITH MEDIAL MENISECTOMY Right 06/11/2013   Procedure: RIGHT KNEE ARTHROSCOPY WITH MEDIAL MENISECTOMY AND CHONDROPLASTY;  Surgeon: Ninetta Lights, MD;  Location: [place];  Service:  Orthopedics;  Laterality: Right;  Chondroplasty, loose bodies, medial menisectomy, lysis of adhesions.  Marland Kitchen KNEE SURGERY Right 2011  . right ankle    . SHOULDER SURGERY Right 2010   Dr. Percell Miller  . TOTAL KNEE ARTHROPLASTY Right 08/25/2014   Procedure: TOTAL KNEE ARTHROPLASTY;  Surgeon: Ninetta Lights, MD;  Location: [place];  Service: Orthopedics;  Laterality: Right;        Home Medications    Prior to Admission medications   Medication Sig Start Date End Date Taking? Authorizing Provider  ALPRAZolam Duanne Moron) 1 MG tablet Take 1-2 mg by mouth at bedtime. For anxiety     [provider]  amphetamine-dextroamphetamine (ADDERALL XR) 25 MG 24 hr capsule Take 25 mg by mouth every morning.    [provider]  calcium-vitamin D 250-100 MG-UNIT tablet Take 1 tablet by mouth 2 (two) times daily. Patient not taking: Reported on 02/04/2018 01/27/18   Ripley Fraise, MD  thiamine 100 MG tablet Take 1 tablet (100 mg total) by mouth daily. 02/06/18   Orson Eva, MD    Family History History reviewed. No pertinent family history.  Social History Social History   Tobacco Use  . Smoking status: Current Every Day Smoker    Packs/day: 0.50    Years: 25.00    Pack years: 12.50    Types: Cigarettes  . Smokeless tobacco: Never Used  Substance Use Topics  . Alcohol use: Yes  Comment: occasionally  . Drug use: No     Allergies   Daypro [oxaprozin], Diclofenac sodium, Indocin [indomethacin], and Zanaflex [tizanidine hcl]   Review of Systems Review of Systems  All other systems reviewed and are negative.    Physical Exam Updated Vital Signs BP (!) 143/89 (BP Location: Left Arm)   Pulse 77   Temp 98.7 F (37.1 C)   Resp 18   Ht 6\' 2"  (1.88 m)   Wt 93 kg   SpO2 99%   BMI 26.32 kg/m   Physical Exam Vitals signs and nursing note reviewed.  Constitutional:      General: He is not in acute distress.    Appearance: He is well-developed. He is not diaphoretic.  HENT:      Head: Normocephalic and atraumatic.  Neck:     Musculoskeletal: Normal range of motion and neck supple.  Cardiovascular:     Rate and Rhythm: Normal rate and regular rhythm.     Heart sounds: No murmur. No friction rub.  Pulmonary:     Effort: Pulmonary effort is normal. No respiratory distress.     Breath sounds: Normal breath sounds. No wheezing or rales.  Abdominal:     General: Bowel sounds are normal. There is no distension.     Palpations: Abdomen is soft.     Tenderness: There is no abdominal tenderness.  Musculoskeletal: Normal range of motion.  Skin:    General: Skin is warm and dry.  Neurological:     Mental Status: He is alert and oriented to person, place, and time.     Coordination: Coordination normal.  Psychiatric:        Attention and Perception: Attention and perception normal.        Mood and Affect: Mood is depressed. Affect is blunt.        Speech: Speech normal.        Behavior: Behavior normal.        Thought Content: Thought content includes suicidal ideation. Thought content does not include homicidal ideation. Thought content includes suicidal plan. Thought content does not include homicidal plan.        Cognition and Memory: Cognition normal.        Judgment: Judgment is impulsive.      ED Treatments / Results  Labs (all labs ordered are listed, but only abnormal results are displayed) Labs Reviewed  COMPREHENSIVE METABOLIC PANEL  ETHANOL  SALICYLATE LEVEL  ACETAMINOPHEN LEVEL  CBC  RAPID URINE DRUG SCREEN, HOSP PERFORMED    EKG None  Radiology No results found.  Procedures Procedures (including critical care time)  Medications Ordered in ED Medications - No data to display   Initial Impression / Assessment and Plan / ED Course  I have reviewed the triage vital signs and the nursing notes.  Pertinent labs & imaging results that were available during my care of the patient were reviewed by me and considered in my medical decision  making (see chart for details).  Patient presenting with complaints of suicidal ideation.  He told his sister that he was "thinking about eating a bullet".  He has alluded to me on several occasions that he is suicidal and did admit to making this statement.  Patient's laboratory studies are pending.  He is in the process of undergoing TTS consultation.  They will determine the final disposition.  Final Clinical Impressions(s) / ED Diagnoses   Final diagnoses:  None    ED Discharge Orders    None  Veryl Speak, MD 08/14/18 2243

## 2018-08-14 NOTE — [facility] Counselor (Signed)
Clinician received consent from pt to contact his sister Wilburn Mylar, [phone]) to obtain collateral information. Clinician called and left a HIPPA compliant voice message with call back number.    Vertell Novak, [place], [facility], [facility] Triage Specialist [phone]

## 2018-08-14 NOTE — ED Triage Notes (Addendum)
Patient states that he feels like killing hisself because he states " he is a piss of shit". Patient states he cant pay his bills and he is tired of all this. Patient also states he is an alcoholic.

## 2018-08-15 ENCOUNTER — Other Ambulatory Visit: Payer: Self-pay

## 2018-08-15 ENCOUNTER — Inpatient Hospital Stay (HOSPITAL_COMMUNITY)
Admission: AD | Admit: 2018-08-15 | Discharge: 2018-08-18 | DRG: 885 | Disposition: A | Discharge status: Home / Self Care | Payer: 59 | Source: Intra-hospital | Attending: Psychiatry | Admitting: Psychiatry

## 2018-08-15 ENCOUNTER — Encounter (HOSPITAL_COMMUNITY): Payer: Self-pay

## 2018-08-15 DIAGNOSIS — R45851 Suicidal ideations: Secondary | ICD-10-CM | POA: Diagnosis present

## 2018-08-15 DIAGNOSIS — F329 Major depressive disorder, single episode, unspecified: Secondary | ICD-10-CM | POA: Diagnosis not present

## 2018-08-15 DIAGNOSIS — G4700 Insomnia, unspecified: Secondary | ICD-10-CM | POA: Diagnosis present

## 2018-08-15 DIAGNOSIS — Z79899 Other long term (current) drug therapy: Secondary | ICD-10-CM | POA: Diagnosis not present

## 2018-08-15 DIAGNOSIS — F419 Anxiety disorder, unspecified: Secondary | ICD-10-CM | POA: Diagnosis present

## 2018-08-15 DIAGNOSIS — E119 Type 2 diabetes mellitus without complications: Secondary | ICD-10-CM | POA: Diagnosis present

## 2018-08-15 DIAGNOSIS — F332 Major depressive disorder, recurrent severe without psychotic features: Principal | ICD-10-CM | POA: Diagnosis present

## 2018-08-15 DIAGNOSIS — F17210 Nicotine dependence, cigarettes, uncomplicated: Secondary | ICD-10-CM | POA: Diagnosis present

## 2018-08-15 DIAGNOSIS — Z20828 Contact with and (suspected) exposure to other viral communicable diseases: Secondary | ICD-10-CM | POA: Diagnosis present

## 2018-08-15 DIAGNOSIS — Z96651 Presence of right artificial knee joint: Secondary | ICD-10-CM | POA: Diagnosis present

## 2018-08-15 DIAGNOSIS — F1020 Alcohol dependence, uncomplicated: Secondary | ICD-10-CM | POA: Diagnosis present

## 2018-08-15 LAB — BASIC METABOLIC PANEL
Anion gap: 13 (ref 5–15)
BUN: 10 mg/dL (ref 6–20)
CO2: 22 mmol/L (ref 22–32)
Calcium: 8.2 mg/dL — ABNORMAL LOW (ref 8.9–10.3)
Chloride: 101 mmol/L (ref 98–111)
Creatinine, Ser: 0.96 mg/dL (ref 0.61–1.24)
GFR calc Af Amer: >60 mL/min (ref >60)
GFR calc non Af Amer: >60 mL/min (ref >60)
Glucose, Bld: 122 mg/dL — ABNORMAL HIGH (ref 70–99)
Potassium: 3.5 mmol/L (ref 3.5–5.1)
Sodium: 136 mmol/L (ref 135–145)

## 2018-08-15 LAB — RAPID URINE DRUG SCREEN, HOSP PERFORMED
Amphetamines: NOT DETECTED
Barbiturates: NOT DETECTED
Benzodiazepines: NOT DETECTED
Cocaine: NOT DETECTED
Opiates: NOT DETECTED
Tetrahydrocannabinol: NOT DETECTED

## 2018-08-15 LAB — GLUCOSE, CAPILLARY: Glucose-Capillary: 81 mg/dL (ref 70–99)

## 2018-08-15 LAB — PROTIME-INR
INR: 0.9 (ref 0.8–1.2)
Prothrombin Time: 12.4 seconds (ref 11.4–15.2)

## 2018-08-15 LAB — SARS CORONAVIRUS 2 BY RT PCR (HOSPITAL ORDER, PERFORMED IN ~~LOC~~ HOSPITAL LAB): SARS Coronavirus 2: NEGATIVE

## 2018-08-15 MED ORDER — FLUOXETINE HCL 20 MG PO CAPS
20.0000 mg | ORAL_CAPSULE | Freq: Every day | ORAL | Status: DC
  Administered 2018-08-15 – 2018-08-17 (×3): 20 mg via ORAL
  Filled 2018-08-15 (×5): qty 1

## 2018-08-15 MED ORDER — VITAMIN B-1 100 MG PO TABS
100.0000 mg | ORAL_TABLET | Freq: Every day | ORAL | Status: DC
  Administered 2018-08-16 – 2018-08-18 (×3): 100 mg via ORAL
  Filled 2018-08-15 (×5): qty 1

## 2018-08-15 MED ORDER — LOPERAMIDE HCL 2 MG PO CAPS: 2.0000 mg | ORAL_CAPSULE | ORAL | Status: AC | PRN

## 2018-08-15 MED ORDER — CLONIDINE HCL 0.1 MG PO TABS: 0.2000 mg | ORAL_TABLET | ORAL | Status: DC | PRN

## 2018-08-15 MED ORDER — LORAZEPAM 1 MG PO TABS
1.0000 mg | ORAL_TABLET | Freq: Every day | ORAL | Status: AC
  Administered 2018-08-18: 1 mg via ORAL
  Filled 2018-08-15: qty 1

## 2018-08-15 MED ORDER — LORAZEPAM 1 MG PO TABS
1.0000 mg | ORAL_TABLET | Freq: Three times a day (TID) | ORAL | Status: AC
  Administered 2018-08-16 (×3): 1 mg via ORAL
  Filled 2018-08-15 (×4): qty 1

## 2018-08-15 MED ORDER — LORAZEPAM 1 MG PO TABS
1.0000 mg | ORAL_TABLET | Freq: Two times a day (BID) | ORAL | Status: AC
  Administered 2018-08-17 (×2): 1 mg via ORAL
  Filled 2018-08-15 (×2): qty 1

## 2018-08-15 MED ORDER — ALUM & MAG HYDROXIDE-SIMETH 200-200-20 MG/5ML PO SUSP: 30.0000 mL | ORAL | Status: DC | PRN

## 2018-08-15 MED ORDER — ONDANSETRON 4 MG PO TBDP: 4.0000 mg | ORAL_TABLET | Freq: Four times a day (QID) | ORAL | Status: AC | PRN

## 2018-08-15 MED ORDER — ACETAMINOPHEN 325 MG PO TABS: 650.0000 mg | ORAL_TABLET | Freq: Four times a day (QID) | ORAL | Status: DC | PRN

## 2018-08-15 MED ORDER — LORAZEPAM 1 MG PO TABS: 1.0000 mg | ORAL_TABLET | Freq: Four times a day (QID) | ORAL | Status: AC | PRN

## 2018-08-15 MED ORDER — TRAZODONE HCL 50 MG PO TABS
50.0000 mg | ORAL_TABLET | Freq: Every evening | ORAL | Status: DC | PRN
  Administered 2018-08-15: 50 mg via ORAL
  Filled 2018-08-15: qty 1

## 2018-08-15 MED ORDER — LORAZEPAM 1 MG PO TABS
1.0000 mg | ORAL_TABLET | Freq: Four times a day (QID) | ORAL | Status: AC
  Administered 2018-08-15 (×4): 1 mg via ORAL
  Filled 2018-08-15 (×3): qty 1

## 2018-08-15 MED ORDER — HYDROXYZINE HCL 25 MG PO TABS
25.0000 mg | ORAL_TABLET | Freq: Four times a day (QID) | ORAL | Status: DC | PRN
  Administered 2018-08-15 (×2): 25 mg via ORAL
  Filled 2018-08-15 (×2): qty 1

## 2018-08-15 MED ORDER — NICOTINE 21 MG/24HR TD PT24
21.0000 mg | MEDICATED_PATCH | Freq: Every day | TRANSDERMAL | Status: DC
  Administered 2018-08-17 – 2018-08-18 (×2): 21 mg via TRANSDERMAL
  Filled 2018-08-15 (×7): qty 1

## 2018-08-15 MED ORDER — ADULT MULTIVITAMIN W/MINERALS CH
1.0000 | ORAL_TABLET | Freq: Every day | ORAL | Status: DC
  Administered 2018-08-15 – 2018-08-18 (×4): 1 via ORAL
  Filled 2018-08-15 (×7): qty 1

## 2018-08-15 NOTE — ED Notes (Signed)
Spoke with pts sister. Informed her pt is doing well and resting at this time. Also informed her that the pt will be transferred to [facility] this morning after Covid swab results come back.

## 2018-08-15 NOTE — ED Provider Notes (Signed)
Patient given fluids for dehydration.  Vitals are appropriate. His hepatitis work-up can continue as an outpatient.  Viral hepatitis panel is pending Patient can be admitted to behavioral health   Ripley Fraise, MD 08/15/18 [phone]

## 2018-08-15 NOTE — Tx Team (Signed)
Initial Treatment Plan 08/15/2018 3:57 AM [name] RCB:[id number]    PATIENT STRESSORS: Financial difficulties Health problems Medication change or noncompliance Substance abuse   PATIENT STRENGTHS: Ability for insight Average or above average intelligence Communication skills General fund of knowledge Motivation for treatment/growth Supportive family/friends   PATIENT IDENTIFIED PROBLEMS:   ETOH dependence  depression  anxiety  Suicidal ideation  "I need to get well"  "I need to stop feeling this way"         DISCHARGE CRITERIA:  Improved stabilization in mood, thinking, and/or behavior Motivation to continue treatment in a less acute level of care Need for constant or close observation no longer present Reduction of life-threatening or endangering symptoms to within safe limits Verbal commitment to aftercare and medication compliance Withdrawal symptoms are absent or subacute and managed without 24-hour nursing intervention  PRELIMINARY DISCHARGE PLAN: Attend aftercare/continuing care group Attend 12-step recovery group Outpatient therapy Return to previous living arrangement  PATIENT/FAMILY INVOLVEMENT: This treatment plan has been presented to and reviewed with the patient, Brandon Miller.  The patient and family have been given the opportunity to ask questions and make suggestions.  JEHU-APPIAH, Megan Salon, RN 08/15/2018, 3:57 AM

## 2018-08-15 NOTE — Progress Notes (Signed)
[place] NOVEL CORONAVIRUS (COVID-19) DAILY CHECK-OFF SYMPTOMS - answer yes or no to each - every day NO YES  Have you had a fever in the past 24 hours?  . Fever (Temp > 37.80C / 100F) X   Have you had any of these symptoms in the past 24 hours? . New Cough .  Sore Throat  .  Shortness of Breath .  Difficulty Breathing .  Unexplained Body Aches   X   Have you had any one of these symptoms in the past 24 hours not related to allergies?   . Runny Nose .  Nasal Congestion .  Sneezing   X   If you have had runny nose, nasal congestion, sneezing in the past 24 hours, has it worsened?  X   EXPOSURES - check yes or no X   Have you traveled outside the state in the past 14 days?  X   Have you been in contact with someone with a confirmed diagnosis of COVID-19 or PUI in the past 14 days without wearing appropriate PPE?  X   Have you been living in the same home as a person with confirmed diagnosis of COVID-19 or a PUI (household contact)?    X   Have you been diagnosed with COVID-19?    X              What to do next: Answered NO to all: Answered YES to anything:   Proceed with unit schedule Follow the [facility] Inpatient Flowsheet.   

## 2018-08-15 NOTE — B[facility] Counselor (Signed)
Adult Comprehensive Assessment  Patient ID: Brandon Miller, male   DOB: 02/01/1965, 53 y.o.   MRN: [id number]  Information Source: Information source: Patient  Current Stressors:  Patient states their primary concerns and needs for treatment are:: Has been drinking heavily lately, had difficulty going to work, had some passive SI without plan. Sister brought him to [facility]. Patient states their goals for this hospitilization and ongoing recovery are:: "Get off the alcohol, hopefully my depression will get better too. Get to feeling better." Educational / Learning stressors: No stressors Employment / Job issues: His job isn't stressful but it doesn't pay well. Family Relationships: Good family relationships, not too many around. Sister is supportive. Financial / Lack of resources (include bankruptcy): Change in income, overwhelmed with bills. Housing / Lack of housing: Lives alone Physical health (include injuries & life threatening diseases): Back pain, had a prior knee replacement Social relationships: Lives alone, lonely Substance abuse: Hx of alcohol abuse, has started drinking heavier lately. Progressed from drinking everynight to drinkign all day every day. Hx of pain pill abuse. Bereavement / Loss: Mother and two brothers passed away in the early 2000's.  Living/Environment/Situation:  Living Arrangements: Alone Living conditions (as described by patient or guardian): Single family home Who else lives in the home?: Self How long has patient lived in current situation?: Entire life, family home What is atmosphere in current home: Comfortable  Family History:  Marital status: Single Are you sexually active?: Yes What is your sexual orientation?: Straight Does patient have children?: No  Childhood History:  By whom was/is the patient raised?: Both parents Description of patient's relationship with caregiver when they were a child: "They were craxy but they were loving  parents." Patient's description of current relationship with people who raised him/her: Both deceased How were you disciplined when you got in trouble as a child/adolescent?: Appropriate Does patient have siblings?: Yes Number of Siblings: 4 Description of patient's current relationship with siblings: 2 older brothers, deceased. 1 living brother, 1 living sister. Good relationships. Did patient suffer any verbal/emotional/physical/sexual abuse as a child?: No Did patient suffer from severe childhood neglect?: No Has patient ever been sexually abused/assaulted/raped as an adolescent or adult?: No Was the patient ever a victim of a crime or a disaster?: No Witnessed domestic violence?: No Has patient been effected by domestic violence as an adult?: No  Education:  Highest grade of school patient has completedPsychiatrist Currently a student?: No Learning disability?: Yes What learning problems does patient have?: ADHD  Employment/Work Situation:   Employment situation: Employed 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, fast food How long has patient been employed?: 2 years Patient's job has been impacted by current illness: Yes Describe how patient's job has been impacted: Missed work all last week due to drinking What is the longest time patient has a held a job?: 28 years Where was the patient employed at that time?: [employer] Did You Receive Any Psychiatric Treatment/Services While in Passenger transport manager?: No Are There Guns or Other Weapons in [place]?: Yes Types of Guns/Weapons: several guns Are These Psychologist, educational?: Yes(Sister removed them)  Pensions consultant:   Museum/gallery curator resources: [employer], Income from employment  Alcohol/Substance Abuse:   What has been your use of drugs/alcohol within the last 12 months?: Heavy ETOH abuse Alcohol/Substance Abuse Treatment Hx: Past Tx, Inpatient, Past detox If yes, describe treatment: Detox about 2  years ago when he lost his job. Prior treatment for pain pill addiction (after knee  surgery) in [place]. Has alcohol/substance abuse ever caused legal problems?: No  Social Support System:   Patient's Community Support System: Fair Describe Community Support System: Family Type of faith/religion: Believes in God How does patient's faith help to cope with current illness?: yes  Leisure/Recreation:   Leisure and Hobbies: Play golf, hunting, fishing, outdoor stuff- not so much lately  Strengths/Needs:   What is the patient's perception of their strengths?: Fishing Patient states these barriers may affect/interfere with their treatment: none Patient states these barriers may affect their return to the community: none  Discharge Plan:   Currently receiving community mental health services: No Patient states concerns and preferences for aftercare planning are: still thinking it over Patient states they will know when they are safe and ready for discharge when: unsure Does patient have access to transportation?: Yes Does patient have financial barriers related to discharge medications?: No Patient description of barriers related to discharge medications: [employer], income from [place] Will patient be returning to same living situation after discharge?: Yes  Summary/Recommendations:   Summary and Recommendations (to be completed by the evaluator): Leveon is a 53 year old male from [place] ([place]). He presents to [facility] from [facility] voluntarily seeking treatment for ETOH abuse and passive SI without a plan. Patient reports that finances are his primary stressor. Patient is not current with outpatient providers. At the time of his assessment, he is unsure if he wants to pursue residential substance use treatment or outpatient follow up. Patient will benefit from crisis stabilization, medication management, therapeutic milieu, and referrals for service  Joellen Jersey. 08/15/2018

## 2018-08-15 NOTE — Progress Notes (Signed)
Patient ID: Brandon Miller, male   DOB: [birth year], 53 y.o.   MRN: [id number] Admission note: Patient is a  Voluntary admission in no acute distress for substance abuse, depression, and suicidal ideation. Pt reports after loosing his job of 28 years in 2016. He became depressed and has been drinking a case of beer daily. Pt endorses recent medication changes. Pt denies  past suicidal attempt by self harm. Pt admitted to unit per protocol, skin assessment and belonging search done. No skin issues noted. Consent signed by pt. Pt educated on therapeutic milieu rules. Pt was introduced to milieu by nursing staff. Fall risk / suicide safety plan explained to the patient. 15 minutes checks started for safety.

## 2018-08-15 NOTE — ED Notes (Signed)
[facility] contacted for transport to [facility]
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
## 2018-08-15 NOTE — Progress Notes (Signed)
D Pt is observed standing at the 300 hall med window this morning.He wears hosptial-issued patient scrubs. His hair is uncombed and he si unshaven. He endorses a flat, depressed affect. His scleras and mucous membranes are dry and pale.      A HE completed his daily assessment and took all of his scheduled meds. He wrote on the assessment he deneid having SI today and he rated his depression, hopelessness and anxeity " 09/29/08", respectively. He denies withdrawal symptoms to this Probation officer.     R Safety si in place and therapeutic relationship fostered.

## 2018-08-15 NOTE — [facility] Suicide Risk Assessment (Signed)
[facility] Admission Suicide Risk Assessment   Nursing information obtained from:  Patient Demographic factors:  Male, Divorced or widowed, Caucasian, Low socioeconomic status, Unemployed Current Mental Status:  Suicidal ideation indicated by patient Loss Factors:  Decrease in vocational status, Decline in physical health, Financial problems / change in socioeconomic status Historical Factors:  Family history of mental illness or substance abuse Risk Reduction Factors:  Living with another person, especially a relative, Positive social support  Total Time spent with patient: 45 minutes Principal Problem: <principal problem not specified> Diagnosis:  Active Problems:   Severe recurrent major depression without psychotic features ([place])  Subjective Data: Admitted reporting suicidal thoughts when intoxicated denies them now  Continued Clinical Symptoms:  Alcohol Use Disorder Identification Test Final Score (AUDIT): 40 The "Alcohol Use Disorders Identification Test", Guidelines for Use in Primary Care, Second Edition.  World Pharmacologist [facility]). Score between 0-7:  no or low risk or alcohol related problems. Score between 8-15:  moderate risk of alcohol related problems. Score between 16-19:  high risk of alcohol related problems. Score 20 or above:  warrants further diagnostic evaluation for alcohol dependence and treatment.   CLINICAL FACTORS:   Alcohol/Substance Abuse/Dependencies   Musculoskeletal: Strength & Muscle Tone: within normal limits Gait & Station: normal Patient leans: N/A  Psychiatric Specialty Exam: Physical Exam head atraumatic vitals show hypertension  ROS on review of systems does have hepatitis/neurological denies recent seizures reports history of seizures when coming up alcohol/denies head trauma/endocrine denies thyroid issues  Blood pressure (!) 139/96, pulse 86, temperature 98 F (36.7 C), temperature source Oral, resp. rate 16, height 6\' 2"  (1.88 m), weight  90.7 kg.Body mass index is 25.68 kg/m.  General Appearance: Fairly Groomed  Eye Contact:  Minimal  Speech:  Clear and Coherent  Volume:  Decreased  Mood:  Depressed, Dysphoric and Worthless  Affect:  Restricted  Thought Process:  Coherent and Descriptions of Associations: Intact  Orientation:  Full (Time, Place, and Person)  Thought Content:  Rumination  Suicidal Thoughts:  No  Homicidal Thoughts:  No  Memory:  Immediate;   Fair  Judgement:  Fair  Insight:  Fair  Psychomotor Activity:  Normal  Concentration:  Concentration: Fair  Recall:  [employer] of Knowledge:  Fair  Language:  Fair  Akathisia:  Negative  Handed:  Right  AIMS (if indicated):     Assets:  Communication Skills Desire for Improvement Housing Leisure Time Physical Health Resilience  ADL's:  Intact  Cognition:  WNL  Sleep:  Number of Hours: 1.75    COGNITIVE FEATURES THAT CONTRIBUTE TO RISK:  None    SUICIDE RISK:   Moderate:  Frequent suicidal ideation with limited intensity, and duration, some specificity in terms of plans, no associated intent, good self-control, limited dysphoria/symptomatology, some risk factors present, and identifiable protective factors, including available and accessible social support.  PLAN OF CARE: Begin detox regimen antidepressant therapy explore causes of hepatitis  I certify that inpatient services furnished can reasonably be expected to improve the patient's condition.   Johnn Hai, MD 08/15/2018, 10:22 AM

## 2018-08-15 NOTE — Progress Notes (Signed)
Patient rated his day a 2.  Patient's goal for today was to not feel to bad. Patient did not meet his goal.  Patient stated that meds at this time are the most helpful to him. Patient stated he is starting to feel worse and that he will need something to help him sleep tonight.

## 2018-08-15 NOTE — [facility] Group Notes (Signed)
LCSW Group Therapy Notes 08/15/2018 1:58 PM  Type of Therapy and Topic: Group Therapy: Overcoming Obstacles  Participation Level: Did Not Attend  Description of Group:  In this group patients will be encouraged to explore what they see as obstacles to their own wellness and recovery. They will be guided to discuss their thoughts, feelings, and behaviors related to these obstacles. The group will process together ways to cope with barriers, with attention given to specific choices patients can make. Each patient will be challenged to identify changes they are motivated to make in order to overcome their obstacles. This group will be process-oriented, with patients participating in exploration of their own experiences as well as giving and receiving support and challenge from other group members.  Therapeutic Goals: 1. Patient will identify personal and current obstacles as they relate to admission. 2. Patient will identify barriers that currently interfere with their wellness or overcoming obstacles.  3. Patient will identify feelings, thought process and behaviors related to these barriers. 4. Patient will identify two changes they are willing to make to overcome these obstacles:   Summary of Patient Progress Invited, chose not to attend.   Therapeutic Modalities:  Cognitive Behavioral Therapy Solution Focused Therapy Motivational Interviewing Relapse Prevention Therapy  Stephanie Acre, MSW, [facility] 08/15/2018 1:58 PM

## 2018-08-15 NOTE — ED Provider Notes (Addendum)
Assumed care in signout to follow-up on labs.  In brief, patient is here for suicidal ideation. He reports drinking 24 beers a day for 3 years.  He denies any other drug use.  He denies any recent APAP use, less likely APAP use/overdose Labs reveal significant hepatitis, likely related to his alcohol abuse.  He will also need a hepatitis panel. He is also dehydrated, will require IV fluids.   Ripley Fraise, MD 08/15/18 0001    Ripley Fraise, MD 08/15/18 [phone]

## 2018-08-15 NOTE — Tx Team (Signed)
Interdisciplinary Treatment and Diagnostic Plan Update  08/15/2018 Time of Session: 9:00am Brandon Miller MRN: [id number]  Principal Diagnosis: <principal problem not specified>  Secondary Diagnoses: Active Problems:   Severe recurrent major depression without psychotic features (HCC)   Current Medications:  Current Facility-Administered Medications  Medication Dose Route Frequency Provider Last Rate Last Dose  . alum & mag hydroxide-simeth (MAALOX/MYLANTA) 200-200-20 MG/5ML suspension 30 mL  30 mL Oral Q4H PRN Lindon Romp A, NP      . cloNIDine (CATAPRES) tablet 0.2 mg  0.2 mg Oral Q4H PRN Johnn Hai, MD      . FLUoxetine (PROZAC) capsule 20 mg  20 mg Oral Daily Johnn Hai, MD   20 mg at 08/15/18 0935  . hydrOXYzine (ATARAX/VISTARIL) tablet 25 mg  25 mg Oral Q6H PRN Lindon Romp A, NP   25 mg at 08/15/18 0343  . loperamide (IMODIUM) capsule 2-4 mg  2-4 mg Oral PRN Lindon Romp A, NP      . LORazepam (ATIVAN) tablet 1 mg  1 mg Oral Q6H PRN Lindon Romp A, NP      . LORazepam (ATIVAN) tablet 1 mg  1 mg Oral QID Lindon Romp A, NP   1 mg at 08/15/18 0829   Followed by  . [START ON 08/16/2018] LORazepam (ATIVAN) tablet 1 mg  1 mg Oral TID Rozetta Nunnery, NP       Followed by  . [START ON 08/17/2018] LORazepam (ATIVAN) tablet 1 mg  1 mg Oral BID Rozetta Nunnery, NP       Followed by  . [START ON 08/18/2018] LORazepam (ATIVAN) tablet 1 mg  1 mg Oral Daily Lindon Romp A, NP      . multivitamin with minerals tablet 1 tablet  1 tablet Oral Daily Lindon Romp A, NP   1 tablet at 08/15/18 0829  . nicotine (NICODERM CQ - dosed in mg/24 hours) patch 21 mg  21 mg Transdermal Q0600 Lindon Romp A, NP      . ondansetron (ZOFRAN-ODT) disintegrating tablet 4 mg  4 mg Oral Q6H PRN Rozetta Nunnery, NP      . Derrill Memo ON 08/16/2018] thiamine (VITAMIN B-1) tablet 100 mg  100 mg Oral Daily Lindon Romp A, NP      . traZODone (DESYREL) tablet 50 mg  50 mg Oral QHS PRN Rozetta Nunnery, NP       PTA  Medications: Medications Prior to Admission  Medication Sig Dispense Refill Last Dose  . ALPRAZolam (XANAX) 1 MG tablet Take 1-2 mg by mouth at bedtime. For anxiety      . amphetamine-dextroamphetamine (ADDERALL XR) 25 MG 24 hr capsule Take 25 mg by mouth every morning.     Marland Kitchen buPROPion (WELLBUTRIN XL) 150 MG 24 hr tablet Take 150 mg by mouth daily.     . calcium-vitamin D 250-100 MG-UNIT tablet Take 1 tablet by mouth 2 (two) times daily. (Patient not taking: Reported on 02/04/2018) 60 tablet 0   . thiamine 100 MG tablet Take 1 tablet (100 mg total) by mouth daily. (Patient not taking: Reported on 08/14/2018)       Patient Stressors: Financial difficulties Health problems Medication change or noncompliance Substance abuse  Patient Strengths: Ability for insight Average or above average intelligence Communication skills General fund of knowledge Motivation for treatment/growth Supportive family/friends  Treatment Modalities: Medication Management, Group therapy, Case management,  1 to 1 session with clinician, Psychoeducation, Recreational therapy.   Physician Treatment Plan for Primary Diagnosis: <principal  problem not specified> Long Term Goal(s): Improvement in symptoms so as ready for discharge Improvement in symptoms so as ready for discharge   Short Term Goals: Ability to identify changes in lifestyle to reduce recurrence of condition will improve Ability to verbalize feelings will improve Ability to disclose and discuss suicidal ideas Ability to demonstrate self-control will improve Ability to demonstrate self-control will improve Ability to maintain clinical measurements within normal limits will improve Compliance with prescribed medications will improve Ability to identify triggers associated with substance abuse/mental health issues will improve  Medication Management: Evaluate patient's response, side effects, and tolerance of medication regimen.  Therapeutic  Interventions: 1 to 1 sessions, Unit Group sessions and Medication administration.  Evaluation of Outcomes: Progressing  Physician Treatment Plan for Secondary Diagnosis: Active Problems:   Severe recurrent major depression without psychotic features ([place])  Long Term Goal(s): Improvement in symptoms so as ready for discharge Improvement in symptoms so as ready for discharge   Short Term Goals: Ability to identify changes in lifestyle to reduce recurrence of condition will improve Ability to verbalize feelings will improve Ability to disclose and discuss suicidal ideas Ability to demonstrate self-control will improve Ability to demonstrate self-control will improve Ability to maintain clinical measurements within normal limits will improve Compliance with prescribed medications will improve Ability to identify triggers associated with substance abuse/mental health issues will improve     Medication Management: Evaluate patient's response, side effects, and tolerance of medication regimen.  Therapeutic Interventions: 1 to 1 sessions, Unit Group sessions and Medication administration.  Evaluation of Outcomes: Progressing   RN Treatment Plan for Primary Diagnosis: <principal problem not specified> Long Term Goal(s): Knowledge of disease and therapeutic regimen to maintain health will improve  Short Term Goals: Ability to verbalize feelings will improve and Compliance with prescribed medications will improve  Medication Management: RN will administer medications as ordered by provider, will assess and evaluate patient's response and provide education to patient for prescribed medication. RN will report any adverse and/or side effects to prescribing provider.  Therapeutic Interventions: 1 on 1 counseling sessions, Psychoeducation, Medication administration, Evaluate responses to treatment, Monitor vital signs and CBGs as ordered, Perform/monitor CIWA, COWS, AIMS and Fall Risk screenings as  ordered, Perform wound care treatments as ordered.  Evaluation of Outcomes: Progressing   LCSW Treatment Plan for Primary Diagnosis: <principal problem not specified> Long Term Goal(s): Safe transition to appropriate next level of care at discharge, Engage patient in therapeutic group addressing interpersonal concerns.  Short Term Goals: Engage patient in aftercare planning with referrals and resources, Increase social support, Identify triggers associated with mental health/substance abuse issues and Increase skills for wellness and recovery  Therapeutic Interventions: Assess for all discharge needs, 1 to 1 time with Social worker, Explore available resources and support systems, Assess for adequacy in community support network, Educate family and significant other(s) on suicide prevention, Complete Psychosocial Assessment, Interpersonal group therapy.  Evaluation of Outcomes: Progressing   Progress in Treatment: Attending groups: Yes. Participating in groups: Yes. Taking medication as prescribed: Yes. Toleration medication: Yes. Family/Significant other contact made: No, will contact:  supports if consents are granted Patient understands diagnosis: Yes. Discussing patient identified problems/goals with staff: Yes. Medical problems stabilized or resolved: Yes. Denies suicidal/homicidal ideation: Yes. Issues/concerns per patient self-inventory: Yes.  New problem(s) identified: Yes, Describe:  financial stressors  New Short Term/Long Term Goal(s): detox, medication management for mood stabilization; elimination of SI thoughts; development of comprehensive mental wellness/sobriety plan.  Patient Goals:  "Feel better."  Discharge Plan or Barriers: CSW continuing to assess for appropriate referrals.   Reason for Continuation of Hospitalization: Anxiety Depression Suicidal ideation  Estimated Length of Stay: 3-5 days  Attendees: Patient: Brandon Miller 08/15/2018 12:21 PM   Physician: Dr.Farah 08/15/2018 12:21 PM  Nursing: Chong Sicilian, RN 08/15/2018 12:21 PM  RN Care Manager: 08/15/2018 12:21 PM  Social Worker: Stephanie Acre, [place] 08/15/2018 12:21 PM  Recreational Therapist:  08/15/2018 12:21 PM  Other:  08/15/2018 12:21 PM  Other:  08/15/2018 12:21 PM  Other: 08/15/2018 12:21 PM    Scribe for Treatment Team: Joellen Jersey, [place] 08/15/2018 12:21 PM

## 2018-08-16 DIAGNOSIS — F329 Major depressive disorder, single episode, unspecified: Secondary | ICD-10-CM

## 2018-08-16 LAB — HEPATITIS PANEL, ACUTE
HCV Ab: <0.1 s/co ratio (ref 0.0–0.9)
HCV Ab: <0.1 s/co ratio (ref 0.0–0.9)
Hep A IgM: NEGATIVE
Hep A IgM: NEGATIVE
Hep B C IgM: NEGATIVE
Hep B C IgM: NEGATIVE
Hepatitis B Surface Ag: NEGATIVE
Hepatitis B Surface Ag: NEGATIVE

## 2018-08-16 MED ORDER — TRAZODONE HCL 100 MG PO TABS
100.0000 mg | ORAL_TABLET | Freq: Every evening | ORAL | Status: DC | PRN
  Administered 2018-08-16 (×2): 100 mg via ORAL
  Filled 2018-08-16 (×2): qty 1

## 2018-08-16 MED ORDER — LIDOCAINE 5 % EX PTCH
1.0000 | MEDICATED_PATCH | CUTANEOUS | Status: DC
  Administered 2018-08-16 – 2018-08-17 (×2): 1 via TRANSDERMAL
  Filled 2018-08-16 (×5): qty 1

## 2018-08-16 NOTE — Progress Notes (Signed)
D: Patient observed up and visible in the milieu. Appears reserved, withdrawn and anxious. Restless at times. Affect anxious with congruent mood. Denies pain, physical complaints. COVID-19 screen negative, afebrile. Respiratory assessment WDL.  A: Medicated per orders, prn trazadone and vistaril given per request. Medication education provided. Level III obs in place for safety. Emotional support offered. Patient encouraged to complete Suicide Safety Plan before discharge. Encouraged to attend and participate in unit programming.  Fall prevention plan in place and reviewed with patient as pt is a high fall risk.   R: Patient verbalizes understanding of POC, falls prevention education. On reassess, patient is asleep. Patient denies SI/HI/AVH and remains safe on level III obs. Will continue to monitor throughout the night.

## 2018-08-16 NOTE — Progress Notes (Signed)
D Pt is observed OOB UAL on the 300 hall today toelrated fair. HE cont to endorse a flat, depressed affect. He wears hospital-issued pt scrubs. He is Geneticist, molecular. He looks down and away, avoiding eye contact.     A HE completed his daily assessment and on this he wrote he denied ahving SI today and he rated his depresison, hoepelssness and anxeity " 8/7/7/", respectively.     R Safety is in place.

## 2018-08-16 NOTE — [facility] Group Notes (Signed)
Identifying Needs     Date:  08/16/2018  Time:  1300  Type of Therapy:  Nurse Education   /   Idnetifying Needs :   The group focuses on teaching patients how to identify their needs and then hwo to develop healtheir coping skills to get their needs met.   Participation Level:  Active  Participation Quality:  Attentive  Affect:  Appropriate  Cognitive:  Alert  Insight:  Good  Engagement in Group:  Engaged  Modes of Intervention:  Education  Summary of Progress/Problems:  Lauralyn Primes 08/16/2018, 3:34 PM

## 2018-08-16 NOTE — Progress Notes (Signed)
[facility] Group Notes:  (Nursing/MHT/Case Management/Adjunct)  Date:  08/16/2018  Time:  2030  Type of Therapy:  wrap up group  Participation Level:  Active  Participation Quality:  Appropriate, Attentive, Sharing and Supportive  Affect:  Depressed  Cognitive:  Appropriate  Insight:  Improving  Engagement in Group:  Engaged  Modes of Intervention:  Clarification, Education and Support  Summary of Progress/Problems:  Brandon Miller 08/16/2018, 11:28 PM

## 2018-08-16 NOTE — Plan of Care (Signed)
  Problem: Education: Goal: Ability to state activities that reduce stress will improve Outcome: Progressing   

## 2018-08-16 NOTE — Progress Notes (Addendum)
[facility] MD Progress Note  08/16/2018 12:10 PM Brandon Miller  MRN:  [id number] Subjective:  "I didn't sleep well."  Mr. Brandon Miller found asleep in bed. He has been isolating to his room. He reports fatigue due to poor sleep overnight. Record shows 6 hours of sleep. He reports withdrawal symptoms- stomach cramps, anxiety, irritability, generalized weakness/malaise. He does report some improvement in mood and denies SI. He believes suicidal statements from prior to admission were largely related to alcohol intoxication at that time. Denies HI/AVH.  From admission H&P: Mr. Brandon Miller is 53 years of age he presented voluntarily on 7/23 reporting suicidal thoughts, financial stress, alcohol dependency, and his blood alcohol level was 288, drug screen negative for other compounds. The patient reports to me he had 1 prior detox in [place], but is been drinking at least 24 beers a day for the past 2 years.   Principal Problem: <principal problem not specified> Diagnosis: Active Problems:   Severe recurrent major depression without psychotic features ([place])  Total Time spent with patient: 15 minutes  Past Psychiatric History: See admission H&P  Past Medical History:  Past Medical History:  Diagnosis Date  . Allergy   . Anemia   . Anxiety   . Arthritis    RA  . Complication of anesthesia    hard to wake up-was told he may have sleep apnea-never tested  . Depression   . Diabetes mellitus without complication ([place])   . Sleep apnea   . Snores     Past Surgical History:  Procedure Laterality Date  . ELBOW SURGERY Right 2010   Dr. Percell Miller  . HERNIA REPAIR    . KNEE ARTHROSCOPY WITH MEDIAL MENISECTOMY Right 06/11/2013   Procedure: RIGHT KNEE ARTHROSCOPY WITH MEDIAL MENISECTOMY AND CHONDROPLASTY;  Surgeon: Ninetta Lights, MD;  Location: [facility];  Service: Orthopedics;  Laterality: Right;  Chondroplasty, loose bodies, medial menisectomy, lysis of adhesions.  [facility] KNEE SURGERY Right 2011  . right  ankle    . SHOULDER SURGERY Right 2010   Dr. Percell Miller  . TOTAL KNEE ARTHROPLASTY Right 08/25/2014   Procedure: TOTAL KNEE ARTHROPLASTY;  Surgeon: Ninetta Lights, MD;  Location: [facility];  Service: Orthopedics;  Laterality: Right;   Family History: History reviewed. No pertinent family history. Family Psychiatric  History: See admission H&P Social History:  Social History   Substance and Sexual Activity  Alcohol Use Yes   Comment: occasionally     Social History   Substance and Sexual Activity  Drug Use No    Social History   Socioeconomic History  . Marital status: Divorced    Spouse name: Not on file  . Number of children: Not on file  . Years of education: Not on file  . Highest education level: Not on file  Occupational History  . Not on file  Social Needs  . Financial resource strain: Not on file  . Food insecurity    Worry: Not on file    Inability: Not on file  . Transportation needs    Medical: Not on file    Non-medical: Not on file  Tobacco Use  . Smoking status: Current Every Day Smoker    Packs/day: 0.50    Years: 25.00    Pack years: 12.50    Types: Cigarettes  . Smokeless tobacco: Never Used  Substance and Sexual Activity  . Alcohol use: Yes    Comment: occasionally  . Drug use: No  . Sexual activity: Not Currently  Lifestyle  .  Physical activity    Days per week: Not on file    Minutes per session: Not on file  . Stress: Not on file  Relationships  . Social Herbalist on phone: Not on file    Gets together: Not on file    Attends religious service: Not on file    Active member of club or organization: Not on file    Attends meetings of clubs or organizations: Not on file    Relationship status: Not on file  Other Topics Concern  . Not on file  Social History Narrative  . Not on file   Additional Social History:                         Sleep: Poor  Appetite:  Fair  Current Medications: Current Facility-Administered  Medications  Medication Dose Route Frequency Provider Last Rate Last Dose  . alum & mag hydroxide-simeth (MAALOX/MYLANTA) 200-200-20 MG/5ML suspension 30 mL  30 mL Oral Q4H PRN Lindon Romp A, NP      . cloNIDine (CATAPRES) tablet 0.2 mg  0.2 mg Oral Q4H PRN Johnn Hai, MD      . FLUoxetine (PROZAC) capsule 20 mg  20 mg Oral Daily Johnn Hai, MD   20 mg at 08/16/18 0905  . hydrOXYzine (ATARAX/VISTARIL) tablet 25 mg  25 mg Oral Q6H PRN Lindon Romp A, NP   25 mg at 08/15/18 2119  . loperamide (IMODIUM) capsule 2-4 mg  2-4 mg Oral PRN Lindon Romp A, NP      . LORazepam (ATIVAN) tablet 1 mg  1 mg Oral Q6H PRN Lindon Romp A, NP      . LORazepam (ATIVAN) tablet 1 mg  1 mg Oral TID Lindon Romp A, NP   1 mg at 08/16/18 0904   Followed by  . [START ON 08/17/2018] LORazepam (ATIVAN) tablet 1 mg  1 mg Oral BID Rozetta Nunnery, NP       Followed by  . [START ON 08/18/2018] LORazepam (ATIVAN) tablet 1 mg  1 mg Oral Daily Lindon Romp A, NP      . multivitamin with minerals tablet 1 tablet  1 tablet Oral Daily Lindon Romp A, NP   1 tablet at 08/16/18 0905  . nicotine (NICODERM CQ - dosed in mg/24 hours) patch 21 mg  21 mg Transdermal Q0600 Lindon Romp A, NP      . ondansetron (ZOFRAN-ODT) disintegrating tablet 4 mg  4 mg Oral Q6H PRN Lindon Romp A, NP      . thiamine (VITAMIN B-1) tablet 100 mg  100 mg Oral Daily Lindon Romp A, NP   100 mg at 08/16/18 0906  . traZODone (DESYREL) tablet 50 mg  50 mg Oral QHS PRN Rozetta Nunnery, NP   50 mg at 08/15/18 2119    Lab Results:  Results for orders placed or performed during the hospital encounter of 08/15/18 (from the past 48 hour(s))  Glucose, capillary     Status: None   Collection Time: 08/15/18  7:15 AM  Result Value Ref Range   Glucose-Capillary 81 70 - 99 mg/dL  Hepatitis panel, acute     Status: None   Collection Time: 08/15/18  6:14 PM  Result Value Ref Range   Hepatitis B Surface Ag Negative Negative   HCV Ab <0.1 0.0 - 0.9 s/co ratio     Comment: (NOTE)  Negative:     < 0.8                             Indeterminate: 0.8 - 0.9                                  Positive:     > 0.9 The CDC recommends that a positive HCV antibody result be followed up with a HCV Nucleic Acid Amplification test ([id number]). Performed At: [facility] [place], [place] [id number] Rush Farmer MD UU:[id number]    Hep A IgM Negative Negative   Hep B C IgM Negative Negative    Blood Alcohol level:  Lab Results  Component Value Date   ETH 288 (H) 08/14/2018   ETH <10 47/42/5956    Metabolic Disorder Labs: Lab Results  Component Value Date   HGBA1C 6.2 (H) 08/25/2014   MPG 131 08/25/2014   MPG 154 (H) 10/30/2011   No results found for: PROLACTIN No results found for: CHOL, TRIG, HDL, CHOLHDL, VLDL, LDLCALC  Physical Findings: AIMS: Facial and Oral Movements Muscles of Facial Expression: None, normal Lips and Perioral Area: None, normal Jaw: None, normal Tongue: None, normal,Extremity Movements Upper (arms, wrists, hands, fingers): None, normal Lower (legs, knees, ankles, toes): None, normal, Trunk Movements Neck, shoulders, hips: None, normal, Overall Severity Severity of abnormal movements (highest score from questions above): None, normal Incapacitation due to abnormal movements: None, normal Patient's awareness of abnormal movements (rate only patient's report): No Awareness, Dental Status Current problems with teeth and/or dentures?: No Does patient usually wear dentures?: No  CIWA:  CIWA-Ar Total: 9 COWS:     Musculoskeletal: Strength & Muscle Tone: within normal limits Gait & Station: normal Patient leans: N/A  Psychiatric Specialty Exam: Physical Exam  Nursing note and vitals reviewed. Constitutional: He is oriented to person, place, and time. He appears well-developed and well-nourished.  Cardiovascular: Normal rate.  Respiratory: Effort normal.   Neurological: He is alert and oriented to person, place, and time.    Review of Systems  Constitutional: Positive for malaise/fatigue. Negative for fever.  Respiratory: Negative for cough and shortness of breath.   Cardiovascular: Negative for chest pain.  Gastrointestinal: Positive for abdominal pain. Negative for diarrhea, nausea and vomiting.  Musculoskeletal: Positive for myalgias.  Neurological: Negative for tremors and headaches.  Psychiatric/Behavioral: Positive for depression and substance abuse. Negative for hallucinations and suicidal ideas. The patient has insomnia. The patient is not nervous/anxious.     Blood pressure 127/80, pulse 64, temperature (!) 97.5 F (36.4 C), resp. rate 16, height 6\' 2"  (1.88 m), weight 90.7 kg.Body mass index is 25.68 kg/m.  General Appearance: Disheveled  Eye Contact:  Fair  Speech:  Clear and Coherent  Volume:  Normal  Mood:  Euthymic  Affect:  Constricted  Thought Process:  Coherent  Orientation:  Full (Time, Place, and Person)  Thought Content:  Logical  Suicidal Thoughts:  No  Homicidal Thoughts:  No  Memory:  Immediate;   Fair Recent;   Fair  Judgement:  Intact  Insight:  Fair  Psychomotor Activity:  Decreased  Concentration:  Concentration: Fair  Recall:  [employer] of Knowledge:  Fair  Language:  Fair  Akathisia:  No  Handed:  Right  AIMS (if indicated):     Assets:  Communication Skills Desire for [place]  ADL's:  Intact  Cognition:  WNL  Sleep:  Number of Hours: 6     Treatment Plan Summary: Daily contact with patient to assess and evaluate symptoms and progress in treatment and Medication management   Continue inpatient hospitalization.  Increase trazodone to 100 mg PO QHS PRN insomnia Continue Ativan CIWA protocol for alcohol withdrawal Continue Prozac 20 mg PO daily for mood Continue Vistaril 25 mg PO Q6HR PRN anxiety Continue thiamine 100 mg PO daily for  supplementation  Patient will participate in the therapeutic group milieu.  Discharge disposition in progress.   Connye Burkitt, NP 08/16/2018, 12:10 PM   Attest to NP progress note

## 2018-08-16 NOTE — [facility] Group Notes (Signed)
LCSW Group Therapy Note  08/16/2018   10:00-11:00am   Type of Therapy and Topic:  Group Therapy: Anger Cues and Responses  Participation Level:  Did Not Attend   Description of Group:   In this group, patients learned how to recognize the physical, cognitive, emotional, and behavioral responses they have to anger-provoking situations.  They identified a recent time they became angry and how they reacted.  They analyzed how their reaction was possibly beneficial and how it was possibly unhelpful.  The group discussed a variety of healthier coping skills that could help with such a situation in the future.  Deep breathing was practiced briefly.  Therapeutic Goals: 1. Patients will remember their last incident of anger and how they felt emotionally and physically, what their thoughts were at the time, and how they behaved. 2. Patients will identify how their behavior at that time worked for them, as well as how it worked against them. 3. Patients will explore possible new behaviors to use in future anger situations. 4. Patients will learn that anger itself is normal and cannot be eliminated, and that healthier reactions can assist with resolving conflict rather than worsening situations.  Summary of Patient Progress:  The patient did not attend.  Therapeutic Modalities:   Cognitive Behavioral Therapy  Rolanda Jay

## 2018-08-16 NOTE — Progress Notes (Signed)
[place] NOVEL CORONAVIRUS (COVID-19) DAILY CHECK-OFF SYMPTOMS - answer yes or no to each - every day NO YES  Have you had a fever in the past 24 hours?  . Fever (Temp > 37.80C / 100F) X   Have you had any of these symptoms in the past 24 hours? . New Cough .  Sore Throat  .  Shortness of Breath .  Difficulty Breathing .  Unexplained Body Aches   X   Have you had any one of these symptoms in the past 24 hours not related to allergies?   . Runny Nose .  Nasal Congestion .  Sneezing   X   If you have had runny nose, nasal congestion, sneezing in the past 24 hours, has it worsened?  X   EXPOSURES - check yes or no X   Have you traveled outside the state in the past 14 days?  X   Have you been in contact with someone with a confirmed diagnosis of COVID-19 or PUI in the past 14 days without wearing appropriate PPE?  X   Have you been living in the same home as a person with confirmed diagnosis of COVID-19 or a PUI (household contact)?    X   Have you been diagnosed with COVID-19?    X              What to do next: Answered NO to all: Answered YES to anything:   Proceed with unit schedule Follow the [facility] Inpatient Flowsheet.   

## 2018-08-17 MED ORDER — MIRTAZAPINE 7.5 MG PO TABS
7.5000 mg | ORAL_TABLET | Freq: Every day | ORAL | Status: DC
  Administered 2018-08-17: 7.5 mg via ORAL
  Filled 2018-08-17 (×4): qty 1

## 2018-08-17 MED ORDER — TRAZODONE HCL 150 MG PO TABS
150.0000 mg | ORAL_TABLET | Freq: Every day | ORAL | Status: DC
  Administered 2018-08-17: 150 mg via ORAL
  Filled 2018-08-17 (×4): qty 1

## 2018-08-17 MED ORDER — TRAZODONE HCL 50 MG PO TABS: 50.0000 mg | ORAL_TABLET | Freq: Every evening | ORAL | Status: DC | PRN

## 2018-08-17 NOTE — Plan of Care (Signed)
  Problem: Self-Concept: Goal: Level of anxiety will decrease Outcome: Progressing

## 2018-08-17 NOTE — Progress Notes (Signed)
[facility] Group Notes:  (Nursing/MHT/Case Management/Adjunct)  Date:  08/17/2018  Time:  2030  Type of Therapy:  wrap up group  Participation Level:  Active  Participation Quality:  Appropriate, Attentive, Sharing and Supportive  Affect:  Blunted  Cognitive:  Appropriate  Insight:  Improving  Engagement in Group:  Engaged  Modes of Intervention:  Clarification, Education and Support  Summary of Progress/Problems: Pt reports feeling better physically, emotionally, and mentally. Pt plans to stop bottling feelings up and start getting them off his chest. Pt is grateful for his family and sister.   Shellia Cleverly 08/17/2018, 10:45 PM

## 2018-08-17 NOTE — Progress Notes (Signed)
[place] NOVEL CORONAVIRUS (COVID-19) DAILY CHECK-OFF SYMPTOMS - answer yes or no to each - every day NO YES  Have you had a fever in the past 24 hours?  . Fever (Temp > 37.80C / 100F) X   Have you had any of these symptoms in the past 24 hours? . New Cough .  Sore Throat  .  Shortness of Breath .  Difficulty Breathing .  Unexplained Body Aches   X   Have you had any one of these symptoms in the past 24 hours not related to allergies?   . Runny Nose .  Nasal Congestion .  Sneezing   X   If you have had runny nose, nasal congestion, sneezing in the past 24 hours, has it worsened?  X   EXPOSURES - check yes or no X   Have you traveled outside the state in the past 14 days?  X   Have you been in contact with someone with a confirmed diagnosis of COVID-19 or PUI in the past 14 days without wearing appropriate PPE?  X   Have you been living in the same home as a person with confirmed diagnosis of COVID-19 or a PUI (household contact)?    X   Have you been diagnosed with COVID-19?    X              What to do next: Answered NO to all: Answered YES to anything:   Proceed with unit schedule Follow the [facility] Inpatient Flowsheet.   

## 2018-08-17 NOTE — [facility] Group Notes (Signed)
[facility] LCSW Group Therapy Note  Date/Time:  08/17/2018 9:00-10:00 or 10:00-11:00AM  Type of Therapy and Topic:  Group Therapy:  Healthy and Unhealthy Supports  Participation Level:  Active   Description of Group:  Patients in this group were introduced to the idea of adding a variety of healthy supports to address the various needs in their lives.Patients discussed what additional healthy supports could be helpful in their recovery and wellness after discharge in order to prevent future hospitalizations.   An emphasis was placed on using counselor, doctor, therapy groups, 12-step groups, and problem-specific support groups to expand supports.  They also worked as a group on developing a specific plan for several patients to deal with unhealthy supports through [place], psychoeducation with loved ones, and even termination of relationships.   Therapeutic Goals:   1)  discuss importance of adding supports to stay well once out of the hospital  2)  compare healthy versus unhealthy supports and identify some examples of each  3)  generate ideas and descriptions of healthy supports that can be added  4)  offer mutual support about how to address unhealthy supports  5)  encourage active participation in and adherence to discharge plan    Summary of Patient Progress:  The patient stated that current healthy supports in his life are his siblings while current unhealthy supports include  A girlfriend he needs to sever ties with.  The patient expressed a willingness to add both church and therapy as supports to help in his recovery journey.   Therapeutic Modalities:   Motivational Interviewing Brief Solution-Focused Therapy  Rolanda Jay

## 2018-08-17 NOTE — Progress Notes (Signed)
D Pt is observed on the 300 hall today. HE wears the same patient scrubs he wore yesterday. HE says the lidoderm pain patch helped " a little". He endorses a flat, dperessed affect. HE denies withdrawal symptoms to this Probation officer.     A HE completed his daily assessment and on this he wrote he denied SI and he rated his depression, hopelessness and anxiety " 3/2/2", respectively.     R Safety in place.

## 2018-08-17 NOTE — Progress Notes (Addendum)
[facility] MD Progress Note  08/17/2018 3:22 PM Brandon Miller  MRN:  [id number] Subjective:  "I'm ok. I still can't sleep at night."  Brandon Miller found sitting in the dayroom. He reports improving mood but states he is still having difficulty sleeping at night. 3 hours of sleep are recorded overnight after increased dose of trazodone. He does admit to history of insomnia and says he has been "drinking himself to sleep" at night recently. He denies other withdrawal symptoms. He denies SI. He denies history of SI outside of periods of alcohol intoxication. He is future-oriented, with plans to follow up outpatient and spend more time with family and at church to stay sober. He is requesting discharge tomorrow.  From admission H&P: Brandon Miller is 53 years of age he presented voluntarily on 7/23 reporting suicidal thoughts, financial stress, alcohol dependency, and his blood alcohol level was 288, drug screen negative for other compounds. The patient reports to me he had 1 prior detox in [place], but is been drinking at least 24 beers a day for the past 2 years.   Principal Problem: <principal problem not specified> Diagnosis: Active Problems:   Severe recurrent major depression without psychotic features ([place])  Total Time spent with patient: 15 minutes  Past Psychiatric History: See admission H&P  Past Medical History:  Past Medical History:  Diagnosis Date  . Allergy   . Anemia   . Anxiety   . Arthritis    RA  . Complication of anesthesia    hard to wake up-was told he may have sleep apnea-never tested  . Depression   . Diabetes mellitus without complication ([place])   . Sleep apnea   . Snores     Past Surgical History:  Procedure Laterality Date  . ELBOW SURGERY Right 2010   Dr. Percell Miller  . HERNIA REPAIR    . KNEE ARTHROSCOPY WITH MEDIAL MENISECTOMY Right 06/11/2013   Procedure: RIGHT KNEE ARTHROSCOPY WITH MEDIAL MENISECTOMY AND CHONDROPLASTY;  Surgeon: Ninetta Lights, MD;  Location: [place];  Service: Orthopedics;  Laterality: Right;  Chondroplasty, loose bodies, medial menisectomy, lysis of adhesions.  Marland Kitchen KNEE SURGERY Right 2011  . right ankle    . SHOULDER SURGERY Right 2010   Dr. Percell Miller  . TOTAL KNEE ARTHROPLASTY Right 08/25/2014   Procedure: TOTAL KNEE ARTHROPLASTY;  Surgeon: Ninetta Lights, MD;  Location: [place];  Service: Orthopedics;  Laterality: Right;   Family History: History reviewed. No pertinent family history. Family Psychiatric  History: See admission H&P Social History:  Social History   Substance and Sexual Activity  Alcohol Use Yes   Comment: occasionally     Social History   Substance and Sexual Activity  Drug Use No    Social History   Socioeconomic History  . Marital status: Divorced    Spouse name: Not on file  . Number of children: Not on file  . Years of education: Not on file  . Highest education level: Not on file  Occupational History  . Not on file  Social Needs  . Financial resource strain: Not on file  . Food insecurity    Worry: Not on file    Inability: Not on file  . Transportation needs    Medical: Not on file    Non-medical: Not on file  Tobacco Use  . Smoking status: Current Every Day Smoker    Packs/day: 0.50    Years: 25.00    Pack years: 12.50    Types:  Cigarettes  . Smokeless tobacco: Never Used  Substance and Sexual Activity  . Alcohol use: Yes    Comment: occasionally  . Drug use: No  . Sexual activity: Not Currently  Lifestyle  . Physical activity    Days per week: Not on file    Minutes per session: Not on file  . Stress: Not on file  Relationships  . Social Herbalist on phone: Not on file    Gets together: Not on file    Attends religious service: Not on file    Active member of club or organization: Not on file    Attends meetings of clubs or organizations: Not on file    Relationship status: Not on file  Other Topics Concern  . Not on file  Social History Narrative   . Not on file   Additional Social History:                         Sleep: Poor  Appetite:  Good  Current Medications: Current Facility-Administered Medications  Medication Dose Route Frequency Provider Last Rate Last Dose  . alum & mag hydroxide-simeth (MAALOX/MYLANTA) 200-200-20 MG/5ML suspension 30 mL  30 mL Oral Q4H PRN Lindon Romp A, NP      . cloNIDine (CATAPRES) tablet 0.2 mg  0.2 mg Oral Q4H PRN Johnn Hai, MD      . FLUoxetine (PROZAC) capsule 20 mg  20 mg Oral Daily Johnn Hai, MD   20 mg at 08/17/18 0272  . hydrOXYzine (ATARAX/VISTARIL) tablet 25 mg  25 mg Oral Q6H PRN Rozetta Nunnery, NP   25 mg at 08/15/18 2119  . lidocaine (LIDODERM) 5 % 1 patch  1 patch Transdermal Q24H Aija Scarfo, Myer Peer, MD   1 patch at 08/16/18 2106  . loperamide (IMODIUM) capsule 2-4 mg  2-4 mg Oral PRN Lindon Romp A, NP      . LORazepam (ATIVAN) tablet 1 mg  1 mg Oral Q6H PRN Lindon Romp A, NP      . LORazepam (ATIVAN) tablet 1 mg  1 mg Oral BID Lindon Romp A, NP   1 mg at 08/17/18 5366   Followed by  . [START ON 08/18/2018] LORazepam (ATIVAN) tablet 1 mg  1 mg Oral Daily Lindon Romp A, NP      . multivitamin with minerals tablet 1 tablet  1 tablet Oral Daily Lindon Romp A, NP   1 tablet at 08/17/18 4403  . nicotine (NICODERM CQ - dosed in mg/24 hours) patch 21 mg  21 mg Transdermal Q0600 Lindon Romp A, NP   21 mg at 08/17/18 0825  . ondansetron (ZOFRAN-ODT) disintegrating tablet 4 mg  4 mg Oral Q6H PRN Lindon Romp A, NP      . thiamine (VITAMIN B-1) tablet 100 mg  100 mg Oral Daily Lindon Romp A, NP   100 mg at 08/17/18 4742  . traZODone (DESYREL) tablet 100 mg  100 mg Oral QHS PRN,MR X 1 Connye Burkitt, NP   100 mg at 08/16/18 2318    Lab Results:  Results for orders placed or performed during the hospital encounter of 08/15/18 (from the past 48 hour(s))  Hepatitis panel, acute     Status: None   Collection Time: 08/15/18  6:14 PM  Result Value Ref Range   Hepatitis B  Surface Ag Negative Negative   HCV Ab <0.1 0.0 - 0.9 s/co ratio    Comment: (NOTE)  Negative:     < 0.8                             Indeterminate: 0.8 - 0.9                                  Positive:     > 0.9 The CDC recommends that a positive HCV antibody result be followed up with a HCV Nucleic Acid Amplification test ([id number]). Performed At: [facility] [place], [place] [id number] Rush Farmer MD ZH:[id number]    Hep A IgM Negative Negative   Hep B C IgM Negative Negative    Blood Alcohol level:  Lab Results  Component Value Date   ETH 288 (H) 08/14/2018   ETH <10 19/62/2297    Metabolic Disorder Labs: Lab Results  Component Value Date   HGBA1C 6.2 (H) 08/25/2014   MPG 131 08/25/2014   MPG 154 (H) 10/30/2011   No results found for: PROLACTIN No results found for: CHOL, TRIG, HDL, CHOLHDL, VLDL, LDLCALC  Physical Findings: AIMS: Facial and Oral Movements Muscles of Facial Expression: None, normal Lips and Perioral Area: None, normal Jaw: None, normal Tongue: None, normal,Extremity Movements Upper (arms, wrists, hands, fingers): None, normal Lower (legs, knees, ankles, toes): None, normal, Trunk Movements Neck, shoulders, hips: None, normal, Overall Severity Severity of abnormal movements (highest score from questions above): None, normal Incapacitation due to abnormal movements: None, normal Patient's awareness of abnormal movements (rate only patient's report): No Awareness, Dental Status Current problems with teeth and/or dentures?: No Does patient usually wear dentures?: No  CIWA:  CIWA-Ar Total: 4 COWS:     Musculoskeletal: Strength & Muscle Tone: within normal limits Gait & Station: normal Patient leans: N/A  Psychiatric Specialty Exam: Physical Exam  Nursing note and vitals reviewed. Constitutional: He is oriented to person, place, and time. He appears well-developed and well-nourished.   Respiratory: Effort normal.  Neurological: He is alert and oriented to person, place, and time.    Review of Systems  Constitutional: Negative.   Respiratory: Negative for cough and shortness of breath.   Cardiovascular: Negative for chest pain.  Gastrointestinal: Negative for diarrhea, nausea and vomiting.  Neurological: Negative for tremors and headaches.  Psychiatric/Behavioral: Positive for substance abuse. Negative for depression, hallucinations and suicidal ideas. The patient has insomnia. The patient is not nervous/anxious.     Blood pressure 115/78, pulse (!) 52, temperature 98.3 F (36.8 C), temperature source Oral, resp. rate 16, height 6\' 2"  (1.88 m), weight 90.7 kg, SpO2 97 %.Body mass index is 25.68 kg/m.  General Appearance: Casual  Eye Contact:  Good  Speech:  Clear and Coherent and Normal Rate  Volume:  Normal  Mood:  Euthymic  Affect:  Appropriate and Congruent  Thought Process:  Coherent and Goal Directed  Orientation:  Full (Time, Place, and Person)  Thought Content:  Logical  Suicidal Thoughts:  No  Homicidal Thoughts:  No  Memory:  Immediate;   Good Recent;   Good  Judgement:  Intact  Insight:  Fair  Psychomotor Activity:  Normal  Concentration:  Concentration: Good and Attention Span: Good  Recall:  [place] of Knowledge:  Fair  Language:  Good  Akathisia:  No  Handed:  Right  AIMS (if indicated):     Assets:  Communication Skills Desire for [place]  ADL's:  Intact  Cognition:  WNL  Sleep:  Number of Hours: 3     Treatment Plan Summary: Daily contact with patient to assess and evaluate symptoms and progress in treatment and Medication management   Discontinue Prozac Start Remeron 7.5 mg PO QHS for mood/sleep Increase trazodone to 150 mg PO QHS for sleep, with 50 mg PO QHS PRN insomnia Continue Ativan CIWA protocol for alcohol withdrawal Continue thiamine 100 mg PO daily for  supplementation  Patient will participate in the therapeutic group milieu.  Discharge disposition in progress.   Connye Burkitt, NP 08/17/2018, 3:22 PM   Attest to NP progress note

## 2018-08-17 NOTE — Plan of Care (Signed)
D: Patient is alert, oriented, pleasant, and cooperative. Denies SI, HI, AVH, and verbally contracts for safety.    A: Medications administered per MD order. Support provided. Patient educated on safety on the unit and medications. Routine safety checks every 15 minutes. Patient stated understanding to tell nurse about any new physical symptoms. Patient understands to tell staff of any needs.     R: No adverse drug reactions noted. Patient verbally contracts for safety. Patient remains safe at this time and will continue to monitor.   Problem: Safety: Goal: Ability to remain free from injury will improve Outcome: Progressing   [place] NOVEL CORONAVIRUS (COVID-19) DAILY CHECK-OFF SYMPTOMS - answer yes or no to each - every day NO YES  Have you had a fever in the past 24 hours?  Fever (Temp > 37.80C / 100F) X   Have you had any of these symptoms in the past 24 hours? New Cough  Sore Throat   Shortness of Breath  Difficulty Breathing  Unexplained Body Aches   X   Have you had any one of these symptoms in the past 24 hours not related to allergies?   Runny Nose  Nasal Congestion  Sneezing   X   If you have had runny nose, nasal congestion, sneezing in the past 24 hours, has it worsened?  X   EXPOSURES - check yes or no X   Have you traveled outside the state in the past 14 days?  X   Have you been in contact with someone with a confirmed diagnosis of COVID-19 or PUI in the past 14 days without wearing appropriate PPE?  X   Have you been living in the same home as a person with confirmed diagnosis of COVID-19 or a PUI (household contact)?    X   Have you been diagnosed with COVID-19?    X              What to do next: Answered NO to all: Answered YES to anything:   Proceed with unit schedule Follow the [facility] Inpatient Flowsheet.

## 2018-08-17 NOTE — Progress Notes (Signed)
D: Patient observed up and somewhat restless on unit. Patient states, "they told me they increased my sleep medicine. I sure hope so. I didn't sleep well last night. My withdrawal symptoms are better too - just some restlessness and anxiety." Patient's affect anxious, mood congruent. Denies pain. COVID-19 screen negative, afebrile. Respiratory assessment WDL.  A: Medicated per orders, prn trazadone given x 2 for sleep. Medication education provided. Level III obs in place for safety. Emotional support offered. Patient encouraged to complete Suicide Safety Plan before discharge. Encouraged to attend and participate in unit programming.  Fall prevention plan in place and reviewed with patient as pt is a high fall risk.   R: Patient verbalizes understanding of POC, falls prevention education. On reassess, patient was asleep. Patient denies SI/HI/AVH and remains safe on level III obs. Will continue to monitor throughout the night.

## 2018-08-17 NOTE — [facility] Group Notes (Signed)
[facility] Group Notes:  (Nursing/MHT/Case Management/Adjunct)  Date:  08/17/2018  Time:  2:06 PM  Type of Therapy:  Psychoeducational Skills  Participation Level:  Active  Participation Quality:  Appropriate and Attentive  Affect:  Appropriate  Cognitive:  Alert and Appropriate  Insight:  Appropriate and Good  Engagement in Group:  Engaged  Modes of Intervention:  Discussion and Education  Summary of Progress/Problems:  Patient was attentive and receptive.  Brandon Miller 08/17/2018, 2:06 PM

## 2018-08-18 MED ORDER — TRAZODONE HCL 150 MG PO TABS: 150.0000 mg | ORAL_TABLET | Freq: Every day | ORAL | 1 refills | Status: DC

## 2018-08-18 MED ORDER — GABAPENTIN 400 MG PO CAPS: 400.0000 mg | ORAL_CAPSULE | Freq: Three times a day (TID) | ORAL | 2 refills | Status: DC

## 2018-08-18 NOTE — Progress Notes (Signed)
Recreation Therapy Notes  Date:  7.27.20 Time: 0930 Location: [address] Dayroom  Group Topic: Stress Management  Goal Area(s) Addresses:  Patient will identify positive stress management techniques. Patient will identify benefits of using stress management post d/c.  Intervention:  Stress Management  Activity :  Meditation.  LRT introduced the stress management technique of meditation.  LRT played a meditation that focused on making the most of your day.  Patients were to listen to follow along as listen meditation played.  Education:  Stress Management, Discharge Planning.   Education Outcome: Acknowledges Education  Clinical Observations/Feedback: Pt did not attend group.     Victorino Sparrow, LRT/CTRS         Ria Comment, Alli Jasmer A 08/18/2018 10:48 AM

## 2018-08-18 NOTE — Discharge Summary (Signed)
Physician Discharge Summary Note  Patient:  Brandon Miller is an 53 y.o., male MRN:  [id number] DOB:  1965/09/25 Patient phone:  [phone] (home)  Patient address:   [address] [place] [address],  Total Time spent with patient: 45 minutes  Date of Admission:  08/15/2018 Date of Discharge: 08/18/2018 Reason for Admission:  Mr. Durr is 53 years of age he presented voluntarily on 7/23 reporting suicidal thoughts, financial stress, alcohol dependency, and his blood alcohol level was 288, drug screen negative for other compounds. The patient reports to me he had 1 prior detox in [place], but is been drinking at least 24 beers a day for the past 2 years.  He states he lives alone and no one is there drinking with him- Though he presented with suicidal thoughts denies current suicidal thoughts and understands what it is to contract for safety and agrees to contract for safety here.  He believes he has had a seizure related to alcohol withdrawal in the past he has had DUI charges in the past but he reports no history of hallucinations when coming off of alcohol. Collateral history revealed that the patient had a longstanding history depression, worse since the death of his parents, he also has access to numerous guns which have to be addressed prior to discharge.  Some of his actions seem to be in the context of an alcoholic blackout as he was behaving bizarrely, he does not recall the issues listed below by the sister.   Principal Problem: <principal problem not specified> Discharge Diagnoses: Active Problems:   Severe recurrent major depression without psychotic features [facility])   Past Psychiatric History: see eval  Past Medical History:  Past Medical History:  Diagnosis Date  . Allergy   . Anemia   . Anxiety   . Arthritis    RA  . Complication of anesthesia    hard to wake up-was told he may have sleep apnea-never tested  . Depression   . Diabetes mellitus without  complication ([place])   . Sleep apnea   . Snores     Past Surgical History:  Procedure Laterality Date  . ELBOW SURGERY Right 2010   Dr. Percell Miller  . HERNIA REPAIR    . KNEE ARTHROSCOPY WITH MEDIAL MENISECTOMY Right 06/11/2013   Procedure: RIGHT KNEE ARTHROSCOPY WITH MEDIAL MENISECTOMY AND CHONDROPLASTY;  Surgeon: Ninetta Lights, MD;  Location: [place];  Service: Orthopedics;  Laterality: Right;  Chondroplasty, loose bodies, medial menisectomy, lysis of adhesions.  Marland Kitchen KNEE SURGERY Right 2011  . right ankle    . SHOULDER SURGERY Right 2010   Dr. Percell Miller  . TOTAL KNEE ARTHROPLASTY Right 08/25/2014   Procedure: TOTAL KNEE ARTHROPLASTY;  Surgeon: Ninetta Lights, MD;  Location: [place];  Service: Orthopedics;  Laterality: Right;   Family History: History reviewed. No pertinent family history. Family Psychiatric  History: see eval Social History:  Social History   Substance and Sexual Activity  Alcohol Use Yes   Comment: occasionally     Social History   Substance and Sexual Activity  Drug Use No    Social History   Socioeconomic History  . Marital status: Divorced    Spouse name: Not on file  . Number of children: Not on file  . Years of education: Not on file  . Highest education level: Not on file  Occupational History  . Not on file  Social Needs  . Financial resource strain: Not on file  . Food insecurity  Worry: Not on file    Inability: Not on file  . Transportation needs    Medical: Not on file    Non-medical: Not on file  Tobacco Use  . Smoking status: Current Every Day Smoker    Packs/day: 0.50    Years: 25.00    Pack years: 12.50    Types: Cigarettes  . Smokeless tobacco: Never Used  Substance and Sexual Activity  . Alcohol use: Yes    Comment: occasionally  . Drug use: No  . Sexual activity: Not Currently  Lifestyle  . Physical activity    Days per week: Not on file    Minutes per session: Not on file  . Stress: Not on file   Relationships  . Social Herbalist on phone: Not on file    Gets together: Not on file    Attends religious service: Not on file    Active member of club or organization: Not on file    Attends meetings of clubs or organizations: Not on file    Relationship status: Not on file  Other Topics Concern  . Not on file  Social History Narrative  . Not on file    Hospital Course:   Patient was admitted under routine precautions he displayed no dangerous behaviors here and was always cooperative and compliant with med adjustments. His detoxification was very successful and he had minimal cravings tremors or withdrawal symptoms.  He was also treated for depressive symptoms.  By the date of discharge on 7/21 he benefited maximally from inpatient therapy at participating groups and individual therapy through both rehab based on cognitive-based.  He had no cravings tremors or withdrawal at this point in time no thoughts of harming himself and contracting fully as mentioned so he stable to go on the meds listed below Physical Findings: AIMS: Facial and Oral Movements Muscles of Facial Expression: None, normal Lips and Perioral Area: None, normal Jaw: None, normal Tongue: None, normal,Extremity Movements Upper (arms, wrists, hands, fingers): None, normal Lower (legs, knees, ankles, toes): None, normal, Trunk Movements Neck, shoulders, hips: None, normal, Overall Severity Severity of abnormal movements (highest score from questions above): None, normal Incapacitation due to abnormal movements: None, normal Patient's awareness of abnormal movements (rate only patient's report): No Awareness, Dental Status Current problems with teeth and/or dentures?: No Does patient usually wear dentures?: No  CIWA:  CIWA-Ar Total: 0 COWS:     Musculoskeletal: Strength & Muscle Tone: within normal limits Gait & Station: normal Patient leans: N/A  Psychiatric Specialty Exam: ROS  Blood pressure  111/73, pulse 76, temperature 98.3 F (36.8 C), temperature source Oral, resp. rate 16, height 6\' 2"  (1.88 m), weight 90.7 kg, SpO2 99 %.Body mass index is 25.68 kg/m.  General Appearance: Casual  Eye Contact::  Good  Speech:  Clear and Coherent409  Volume:  Normal  Mood:  Euthymic  Affect:  Full Range  Thought Process:  Coherent and Descriptions of Associations: Intact  Orientation:  Full (Time, Place, and Person)  Thought Content:  Logical  Suicidal Thoughts:  No  Homicidal Thoughts:  No  Memory:  Recent;   Good  Judgement:  Fair  Insight:  Good  Psychomotor Activity:  Normal  Concentration:  Good  Recall:  Good  Fund of Knowledge:Good  Language: Good  Akathisia:  Negative  Handed:  Right  AIMS (if indicated):     Assets:  Communication Skills Desire for Improvement Financial Resources/Insurance Housing Leisure Time Physical Health Resilience  Sleep:  Number of Hours: 4.5  Cognition: WNL  ADL's:  Intact     Have you used any form of tobacco in the last 30 days? (Cigarettes, Smokeless Tobacco, Cigars, and/or Pipes): Yes  Has this patient used any form of tobacco in the last 30 days? (Cigarettes, Smokeless Tobacco, Cigars, and/or Pipes) Yes, No  Blood Alcohol level:  Lab Results  Component Value Date   ETH 288 (H) 08/14/2018   ETH <10 28/63/8177    Metabolic Disorder Labs:  Lab Results  Component Value Date   HGBA1C 6.2 (H) 08/25/2014   MPG 131 08/25/2014   MPG 154 (H) 10/30/2011   No results found for: PROLACTIN No results found for: CHOL, TRIG, HDL, CHOLHDL, VLDL, LDLCALC  See Psychiatric Specialty Exam and Suicide Risk Assessment completed by Attending Physician prior to discharge.  Discharge destination:  Home  Is patient on multiple antipsychotic therapies at discharge:  No   Has Patient had three or more failed trials of antipsychotic monotherapy by history:  No  Recommended Plan for Multiple Antipsychotic Therapies: NA   Allergies as of  08/18/2018      Reactions   Daypro [oxaprozin] Other (See Comments)   'tears his stomach up"   Diclofenac Sodium Other (See Comments)   dizzy   Indocin [indomethacin] Nausea And Vomiting   Zanaflex [tizanidine Hcl] Swelling      Medication List    STOP taking these medications   ALPRAZolam 1 MG tablet Commonly known as: XANAX   amphetamine-dextroamphetamine 25 MG 24 hr capsule Commonly known as: ADDERALL XR     TAKE these medications     Indication  buPROPion 150 MG 24 hr tablet Commonly known as: WELLBUTRIN XL Take 150 mg by mouth daily.  Indication: Major Depressive Disorder   calcium-vitamin D 250-100 MG-UNIT tablet Take 1 tablet by mouth 2 (two) times daily.  Indication: 21-Hydroxylase Deficiency   gabapentin 400 MG capsule Commonly known as: Neurontin Take 1 capsule (400 mg total) by mouth 3 (three) times daily.  Indication: Abuse or Misuse of Alcohol   thiamine 100 MG tablet Take 1 tablet (100 mg total) by mouth daily.  Indication: Elevated Levels of the Amino Acid Alanine in the Blood   traZODone 150 MG tablet Commonly known as: DESYREL Take 1 tablet (150 mg total) by mouth at bedtime.  Indication: Abuse or Misuse of Alcohol      Follow-up [place] Follow up on 08/27/2018.   Why: Virtual medication management appointment with Marni Griffon is Wednesday, 8/5 at 10:00a.  Please call within 24 hours of discharge to confirm appt, give office email address and pay the $20 deposit.  Contact information: [address] [place] [address] [phone]        Patient Declines Follow up.   Contact information: Patient declines outpatient therapy referrals          Signed: Johnn Hai, MD 08/18/2018, 12:51 PM

## 2018-08-18 NOTE — Progress Notes (Signed)
  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  Patient to Follow up at: [facility] Follow up on 08/27/2018.   Why: Virtual medication management appointment with Marni Griffon is Wednesday, 8/5 at 10:00a.  Please call within 24 hours of discharge to confirm appt, give office email address and pay the $20 deposit.  Contact information: [address] [place] [address] [phone]        Patient Declines Follow up.   Contact information: Patient declines outpatient therapy referrals          Next level of care provider has access to [place] and Suicide Prevention discussed: Yes,  with sister,  Have you used any form of tobacco in the last 30 days? (Cigarettes, Smokeless Tobacco, Cigars, and/or Pipes): Yes  Has patient been referred to the Quitline?: Patient refused referral  Patient has been referred for addiction treatment: Yes  Joellen Jersey, [place] 08/18/2018, 10:12 AM

## 2018-08-18 NOTE — BHH Suicide Risk Assessment (Signed)
[facility] Discharge Suicide Risk Assessment   Principal Problem: <principal problem not specified> Discharge Diagnoses: Active Problems:   Severe recurrent major depression without psychotic features ([place])   Total Time spent with patient: 45 minutes  Musculoskeletal: Strength & Muscle Tone: within normal limits Gait & Station: normal Patient leans: N/A  Psychiatric Specialty Exam: ROS  Blood pressure 111/73, pulse 76, temperature 98.3 F (36.8 C), temperature source Oral, resp. rate 16, height 6\' 2"  (1.88 m), weight 90.7 kg, SpO2 99 %.Body mass index is 25.68 kg/m.  General Appearance: Casual  Eye Contact::  Good  Speech:  Clear and Coherent409  Volume:  Normal  Mood:  Euthymic  Affect:  Full Range  Thought Process:  Coherent and Descriptions of Associations: Intact  Orientation:  Full (Time, Place, and Person)  Thought Content:  Logical  Suicidal Thoughts:  No  Homicidal Thoughts:  No  Memory:  Recent;   Good  Judgement:  Fair  Insight:  Good  Psychomotor Activity:  Normal  Concentration:  Good  Recall:  Good  Fund of Knowledge:Good  Language: Good  Akathisia:  Negative  Handed:  Right  AIMS (if indicated):     Assets:  Communication Skills Desire for Improvement Financial Resources/Insurance Housing Leisure Time Physical Health Resilience  Sleep:  Number of Hours: 4.5  Cognition: WNL  ADL's:  Intact   Mental Status Per Nursing Assessment::   On Admission:  Suicidal ideation indicated by patient  Demographic Factors:  Unemployed  Loss Factors: NA  Historical Factors: Impulsivity  Risk Reduction Factors:   Sense of responsibility to family and Religious beliefs about death  Continued Clinical Symptoms:  Alcohol/Substance Abuse/Dependencies  Cognitive Features That Contribute To Risk:  n/a   Suicide Risk:  Minimal: No identifiable suicidal ideation.  Patients presenting with no risk factors but with morbid ruminations; may be classified as minimal  risk based on the severity of the depressive symptoms    Plan Of Care/Follow-up recommendations:  Activity:  full  Arran Fessel, MD 08/18/2018, 8:00 AM

## 2018-08-18 NOTE — Progress Notes (Signed)
D:  Patient's self inventory sheet, patient sleeps good, sleep medication helpful.  Good appetite, normal energy level, good concentration.  Rated depression and anxiety 1, denied depression.  Denied SI.  Denied physical problems.  Physical pain, back and knees.  No pain medicine.   Discharge goal.  MD said to discharge.  Does have discharge plans. A:  Medications administered per MD orders.  Emotional support and encouragement given patient. R:  Denied SI and HI, contracts for safety.  Denied A/V hallucinations.  Safety maintained with 15 minute checks.

## 2018-08-18 NOTE — BHH Suicide Risk Assessment (Signed)
[facility] INPATIENT:  Family/Significant Other Suicide Prevention Education  Suicide Prevention Education:  Education Completed; sister, Wilburn Mylar [phone] has been identified by the patient as the family member/significant other with whom the patient will be residing, and identified as the person(s) who will aid the patient in the event of a mental health crisis (suicidal ideations/suicide attempt).  With written consent from the patient, the family member/significant other has been provided the following suicide prevention education, prior to the and/or following the discharge of the patient.  The suicide prevention education provided includes the following:  Suicide risk factors  Suicide prevention and interventions  National Suicide Hotline telephone number  [facility] assessment telephone number  [facility] Emergency Assistance [place] and/or Residential Mobile Crisis Unit telephone number  Request made of family/significant other to:  Remove weapons (e.g., guns, rifles, knives), all items previously/currently identified as safety concern.    Remove drugs/medications (over-the-counter, prescriptions, illicit drugs), all items previously/currently identified as a safety concern.  The family member/significant other verbalizes understanding of the suicide prevention education information provided.  The family member/significant other agrees to remove the items of safety concern listed above.  Sister wanted to ensure that the patient was medically detoxed and emotionally ready for discharge. She is worried about him relapsing on ETOH and states she had an extensive conversation with him this weekend about her expectations at discharge.  She will pick up the patient today.   Joellen Jersey 08/18/2018, 9:56 AM

## 2018-08-18 NOTE — Progress Notes (Signed)
Discharge Note:  Patient discharged home with family member.  Patient denied SI and HI.  Denied A/V hallucinations.  Suicide prevention information given and discussed with patient who stated he understood and had no questions.  Patient stated he received all his belongings, clothing, toiletries, misc items, etc.  Patient stated he appreciated all assistance received frodm [facility] staff.  All required discharge information given to patient at discharge.

## 2018-08-25 ENCOUNTER — Emergency Department (HOSPITAL_COMMUNITY)
Admission: EM | Admit: 2018-08-25 | Discharge: 2018-08-26 | Disposition: A | Discharge status: Home / Self Care | Payer: 59 | Attending: Emergency Medicine | Admitting: Emergency Medicine

## 2018-08-25 ENCOUNTER — Encounter (HOSPITAL_COMMUNITY): Payer: Self-pay

## 2018-08-25 DIAGNOSIS — E119 Type 2 diabetes mellitus without complications: Secondary | ICD-10-CM | POA: Insufficient documentation

## 2018-08-25 DIAGNOSIS — F17210 Nicotine dependence, cigarettes, uncomplicated: Secondary | ICD-10-CM | POA: Insufficient documentation

## 2018-08-25 DIAGNOSIS — F329 Major depressive disorder, single episode, unspecified: Secondary | ICD-10-CM | POA: Insufficient documentation

## 2018-08-25 DIAGNOSIS — Z96651 Presence of right artificial knee joint: Secondary | ICD-10-CM | POA: Insufficient documentation

## 2018-08-25 DIAGNOSIS — Z79899 Other long term (current) drug therapy: Secondary | ICD-10-CM | POA: Diagnosis not present

## 2018-08-25 DIAGNOSIS — F1029 Alcohol dependence with unspecified alcohol-induced disorder: Secondary | ICD-10-CM

## 2018-08-25 NOTE — ED Triage Notes (Signed)
Pt here for detox, was recently here and discharged so he went home and started drinking again He is going to a rehab place in [place] when he is medically cleared and they are picking him up

## 2018-08-26 MED ORDER — CHLORDIAZEPOXIDE HCL 25 MG PO CAPS: ORAL_CAPSULE | ORAL | 0 refills | Status: DC

## 2018-08-26 NOTE — ED Provider Notes (Signed)
[facility] DEPT Provider Note   CSN: [id number] Arrival date & time: 08/25/18  2116     History   Chief Complaint Chief Complaint  Patient presents with  . detox    HPI Thaniel Coluccio is a 53 y.o. male.     Patient is a 53 year old male with past medical history of diabetes, depression, total knee replacement.  He presents today for evaluation of alcohol abuse.  Patient was recently admitted to behavioral health for suicidal ideation and alcohol dependence.  He was discharged, but promptly resumed drinking.  Patient last had alcohol this evening at 630.  He states he has a facility in [place] that has agreed to take him into a rehab facility, however he needs medical clearance.  Patient just 2 weeks ago had multiple laboratory studies performed.    The history is provided by the patient.    Past Medical History:  Diagnosis Date  . Allergy   . Anemia   . Anxiety   . Arthritis    RA  . Complication of anesthesia    hard to wake up-was told he may have sleep apnea-never tested  . Depression   . Diabetes mellitus without complication ([place])   . Sleep apnea   . Snores     Patient Active Problem List   Diagnosis Date Noted  . Severe recurrent major depression without psychotic features ([place]) 08/15/2018  . Alcohol withdrawal seizure with perceptual disturbance ([place])   . Alcohol abuse with unspecified alcohol-induced disorder ([place]) 02/05/2018  . Transaminasemia 02/05/2018  . Tobacco abuse 02/05/2018  . Depression   . Alcohol withdrawal seizure without complication ([place]) 22/02/5425  . DJD (degenerative joint disease) of knee 08/25/2014  . Diabetes mellitus, type 2 ([place]) 10/31/2011  . Hyperglycemia 10/31/2011  . PNA (pneumonia) 10/30/2011  . LEG PAIN, RIGHT 01/20/2010    Past Surgical History:  Procedure Laterality Date  . ELBOW SURGERY Right 2010   Dr. Percell Miller  . HERNIA REPAIR    . KNEE ARTHROSCOPY WITH MEDIAL MENISECTOMY Right  06/11/2013   Procedure: RIGHT KNEE ARTHROSCOPY WITH MEDIAL MENISECTOMY AND CHONDROPLASTY;  Surgeon: Ninetta Lights, MD;  Location: [facility];  Service: Orthopedics;  Laterality: Right;  Chondroplasty, loose bodies, medial menisectomy, lysis of adhesions.  Marland Kitchen KNEE SURGERY Right 2011  . right ankle    . SHOULDER SURGERY Right 2010   Dr. Percell Miller  . TOTAL KNEE ARTHROPLASTY Right 08/25/2014   Procedure: TOTAL KNEE ARTHROPLASTY;  Surgeon: Ninetta Lights, MD;  Location: [facility];  Service: Orthopedics;  Laterality: Right;        Home Medications    Prior to Admission medications   Medication Sig Start Date End Date Taking? Authorizing Provider  buPROPion (WELLBUTRIN XL) 150 MG 24 hr tablet Take 150 mg by mouth daily.    [provider]  calcium-vitamin D 250-100 MG-UNIT tablet Take 1 tablet by mouth 2 (two) times daily. Patient not taking: Reported on 02/04/2018 01/27/18   Ripley Fraise, MD  gabapentin (NEURONTIN) 400 MG capsule Take 1 capsule (400 mg total) by mouth 3 (three) times daily. 08/18/18 08/18/19  Johnn Hai, MD  thiamine 100 MG tablet Take 1 tablet (100 mg total) by mouth daily. Patient not taking: Reported on 08/14/2018 02/06/18   Orson Eva, MD  traZODone (DESYREL) 150 MG tablet Take 1 tablet (150 mg total) by mouth at bedtime. 08/18/18   Johnn Hai, MD    Family History History reviewed. No pertinent family history.  Social History Social History   Tobacco Use  . Smoking status: Current Every Day Smoker    Packs/day: 0.50    Years: 25.00    Pack years: 12.50    Types: Cigarettes  . Smokeless tobacco: Never Used  Substance Use Topics  . Alcohol use: Yes    Comment: occasionally  . Drug use: No     Allergies   Daypro [oxaprozin], Diclofenac sodium, Indocin [indomethacin], and Zanaflex [tizanidine hcl]   Review of Systems Review of Systems  All other systems reviewed and are negative.    Physical Exam Updated Vital Signs BP (!) 144/90  (BP Location: Left Arm)   Pulse 66   Temp 97.9 F (36.6 C) (Oral)   Resp 18   SpO2 97%   Physical Exam Vitals signs and nursing note reviewed.  Constitutional:      General: He is not in acute distress.    Appearance: He is well-developed. He is not diaphoretic.  HENT:     Head: Normocephalic and atraumatic.  Neck:     Musculoskeletal: Normal range of motion and neck supple.  Cardiovascular:     Rate and Rhythm: Normal rate and regular rhythm.     Heart sounds: No murmur. No friction rub.  Pulmonary:     Effort: Pulmonary effort is normal. No respiratory distress.     Breath sounds: Normal breath sounds. No wheezing or rales.  Abdominal:     General: Bowel sounds are normal. There is no distension.     Palpations: Abdomen is soft.     Tenderness: There is no abdominal tenderness.  Musculoskeletal: Normal range of motion.  Skin:    General: Skin is warm and dry.  Neurological:     Mental Status: He is alert and oriented to person, place, and time.     Coordination: Coordination normal.      ED Treatments / Results  Labs (all labs ordered are listed, but only abnormal results are displayed) Labs Reviewed - No data to display  EKG None  Radiology No results found.  Procedures Procedures (including critical care time)  Medications Ordered in ED Medications - No data to display   Initial Impression / Assessment and Plan / ED Course  I have reviewed the triage vital signs and the nursing notes.  Pertinent labs & imaging results that were available during my care of the patient were reviewed by me and considered in my medical decision making (see chart for details).  Patient had laboratory studies performed approximately 1 weeks ago.  I see no indication to repeat this.  I feel as though he is medically cleared to go to the [facility] facility he mentioned.  Patient's vital signs are stable, he is not shaky, and does not appear to be actively withdrawing.  I will give  the patient a prescription for Librium and the outpatient resource guide.  He is to follow-up with alcohol treatment centers as listed in this guide.  Final Clinical Impressions(s) / ED Diagnoses   Final diagnoses:  None    ED Discharge Orders    None       Veryl Speak, MD 08/26/18 [phone]

## 2018-08-26 NOTE — Discharge Instructions (Addendum)
Begin taking Librium as prescribed.  Follow-up with alcohol treatment centers as listed in the resource guide you have been provided with this evening.

## 2018-08-26 NOTE — ED Notes (Signed)
Pt didn't want to wait in the room, so he gathered his belongings and took his discharge papers and said he was going to kep calling the guy to come get him

## 2018-09-24 ENCOUNTER — Other Ambulatory Visit: Payer: Self-pay

## 2018-09-24 DIAGNOSIS — E119 Type 2 diabetes mellitus without complications: Secondary | ICD-10-CM | POA: Insufficient documentation

## 2018-09-24 DIAGNOSIS — Z79899 Other long term (current) drug therapy: Secondary | ICD-10-CM | POA: Insufficient documentation

## 2018-09-24 DIAGNOSIS — F17210 Nicotine dependence, cigarettes, uncomplicated: Secondary | ICD-10-CM | POA: Insufficient documentation

## 2018-09-24 DIAGNOSIS — R21 Rash and other nonspecific skin eruption: Secondary | ICD-10-CM | POA: Insufficient documentation

## 2018-09-25 ENCOUNTER — Emergency Department (HOSPITAL_COMMUNITY)
Admission: EM | Admit: 2018-09-25 | Discharge: 2018-09-25 | Disposition: A | Discharge status: Home / Self Care | Payer: 59 | Attending: Emergency Medicine | Admitting: Emergency Medicine

## 2018-09-25 ENCOUNTER — Encounter (HOSPITAL_COMMUNITY): Payer: Self-pay | Admitting: Emergency Medicine

## 2018-09-25 ENCOUNTER — Other Ambulatory Visit: Payer: Self-pay

## 2018-09-25 DIAGNOSIS — R21 Rash and other nonspecific skin eruption: Secondary | ICD-10-CM

## 2018-09-25 MED ORDER — PREDNISONE 10 MG (21) PO TBPK: ORAL_TABLET | Freq: Every day | ORAL | 0 refills | Status: DC

## 2018-09-25 MED ORDER — HYDROXYZINE HCL 25 MG PO TABS: 25.0000 mg | ORAL_TABLET | Freq: Four times a day (QID) | ORAL | 0 refills | Status: DC

## 2018-09-25 MED ORDER — DEXAMETHASONE SODIUM PHOSPHATE 10 MG/ML IJ SOLN
10.0000 mg | Freq: Once | INTRAMUSCULAR | Status: AC
  Administered 2018-09-25: 10 mg via INTRAMUSCULAR
  Filled 2018-09-25: qty 1

## 2018-09-25 NOTE — ED Provider Notes (Signed)
[facility] EMERGENCY DEPARTMENT Provider Note   CSN: [id number] Arrival date & time: 09/24/18  2357     History   Chief Complaint Chief Complaint  Patient presents with  . Rash    HPI Brandon Miller is a 53 y.o. male presents to the ER for evaluation of rash.  Onset 4-5 days ago.  Rash is red, intensely itchy and painful described as "burning".  He has tried benadryl and hydrocortisone without relief.  States he went swimming at [facility] two days and the day before rash started.  States his girlfriend had similar rash when she went swimming at the same place in the past.  His rash is located to upper and lower extremities, axilla and feet.  Has noticed his feet are red, splotchy, burning and slightly swollen which began yesterday and worsened in the last 8.5 hr while waiting in the ER waiting room.  He denies recent exposure to any new topical hygiene products, medicines, pets.  No sick contacts at home with same symptoms.  He denies associated fever, vomiting, diarrhea,abdominal pain, cough, facial lip tongue throat swelling, shortness of breath.      HPI  Past Medical History:  Diagnosis Date  . Allergy   . Anemia   . Anxiety   . Arthritis    RA  . Complication of anesthesia    hard to wake up-was told he may have sleep apnea-never tested  . Depression   . Diabetes mellitus without complication ([place])   . Sleep apnea   . Snores     Patient Active Problem List   Diagnosis Date Noted  . Severe recurrent major depression without psychotic features ([place]) 08/15/2018  . Alcohol withdrawal seizure with perceptual disturbance ([place])   . Alcohol abuse with unspecified alcohol-induced disorder ([place]) 02/05/2018  . Transaminasemia 02/05/2018  . Tobacco abuse 02/05/2018  . Depression   . Alcohol withdrawal seizure without complication ([place]) [id number]  . DJD (degenerative joint disease) of knee 08/25/2014  . Diabetes mellitus, type 2 ([place]) 10/31/2011  .  Hyperglycemia 10/31/2011  . PNA (pneumonia) 10/30/2011  . LEG PAIN, RIGHT 01/20/2010    Past Surgical History:  Procedure Laterality Date  . ELBOW SURGERY Right 2010   Dr. Percell Miller  . HERNIA REPAIR    . KNEE ARTHROSCOPY WITH MEDIAL MENISECTOMY Right 06/11/2013   Procedure: RIGHT KNEE ARTHROSCOPY WITH MEDIAL MENISECTOMY AND CHONDROPLASTY;  Surgeon: Ninetta Lights, MD;  Location: [facility] [place];  Service: Orthopedics;  Laterality: Right;  Chondroplasty, loose bodies, medial menisectomy, lysis of adhesions.  Marland Kitchen KNEE SURGERY Right 2011  . right ankle    . SHOULDER SURGERY Right 2010   Dr. Percell Miller  . TOTAL KNEE ARTHROPLASTY Right 08/25/2014   Procedure: TOTAL KNEE ARTHROPLASTY;  Surgeon: Ninetta Lights, MD;  Location: [facility];  Service: Orthopedics;  Laterality: Right;        Home Medications    Prior to Admission medications   Medication Sig Start Date End Date Taking? Authorizing Provider  buPROPion (WELLBUTRIN XL) 150 MG 24 hr tablet Take 150 mg by mouth daily.    [provider]  calcium-vitamin D 250-100 MG-UNIT tablet Take 1 tablet by mouth 2 (two) times daily. Patient not taking: Reported on 02/04/2018 01/27/18   Ripley Fraise, MD  chlordiazePOXIDE (LIBRIUM) 25 MG capsule 50mg  PO TID x 1D, then 25-50mg  PO BID X 1D, then 25-50mg  PO QD X 1D 08/26/18   Veryl Speak, MD  gabapentin (NEURONTIN) 400 MG capsule  Take 1 capsule (400 mg total) by mouth 3 (three) times daily. 08/18/18 08/18/19  Johnn Hai, MD  hydrOXYzine (ATARAX/VISTARIL) 25 MG tablet Take 1 tablet (25 mg total) by mouth every 6 (six) hours. 09/25/18   Kinnie Feil, PA-C  predniSONE (STERAPRED UNI-PAK 21 TAB) 10 MG (21) TBPK tablet Take by mouth daily. Take 6 tabs by mouth daily  for 2 days, then 5 tabs for 2 days, then 4 tabs for 2 days, then 3 tabs for 2 days, 2 tabs for 2 days, then 1 tab by mouth daily for 2 days 09/25/18   Kinnie Feil, PA-C  thiamine 100 MG tablet Take 1 tablet (100 mg total) by  mouth daily. Patient not taking: Reported on 08/14/2018 02/06/18   Orson Eva, MD  traZODone (DESYREL) 150 MG tablet Take 1 tablet (150 mg total) by mouth at bedtime. 08/18/18   Johnn Hai, MD    Family History No family history on file.  Social History Social History   Tobacco Use  . Smoking status: Current Every Day Smoker    Packs/day: 0.50    Years: 25.00    Pack years: 12.50    Types: Cigarettes  . Smokeless tobacco: Never Used  Substance Use Topics  . Alcohol use: Yes    Comment: occasionally  . Drug use: No     Allergies   Daypro [oxaprozin], Diclofenac sodium, Indocin [indomethacin], and Zanaflex [tizanidine hcl]   Review of Systems Review of Systems  Skin: Positive for rash (itching).  All other systems reviewed and are negative.    Physical Exam Updated Vital Signs BP 121/81   Pulse (!) 54   Temp 97.8 F (36.6 C)   Resp 16   SpO2 100%   Physical Exam Vitals signs and nursing note reviewed.  Constitutional:      General: He is not in acute distress.    Appearance: He is well-developed.     Comments: NAD.  HENT:     Head: Normocephalic and atraumatic.     Right Ear: External ear normal.     Left Ear: External ear normal.     Nose: Nose normal.     Mouth/Throat:     Comments: MMM.  Oropharynx and tonsils normal. No mucosal lesions. No facial lip oropharyngeal or tongue edema  Eyes:     Conjunctiva/sclera: Conjunctivae normal.  Neck:     Musculoskeletal: Normal range of motion and neck supple.  Cardiovascular:     Rate and Rhythm: Normal rate and regular rhythm.     Heart sounds: Normal heart sounds.  Pulmonary:     Effort: Pulmonary effort is normal.     Breath sounds: Normal breath sounds.  Musculoskeletal: Normal range of motion.  Skin:    General: Skin is warm and dry.     Capillary Refill: Capillary refill takes less than 2 seconds.     Comments: Non tender blanchable macular rash to upper, lower extremities, upper back and dorsum of  feet.  Excoriations noted to medial thighs and forearms.  No vesicular lesions. No tenderness, warmth, discharge, fluctuance over rash.  No rash to palms, soles.    Neurological:     Mental Status: He is alert and oriented to person, place, and time.  Psychiatric:        Behavior: Behavior normal.        Thought Content: Thought content normal.        Judgment: Judgment normal.      ED Treatments /  Results  Labs (all labs ordered are listed, but only abnormal results are displayed) Labs Reviewed - No data to display  EKG None  Radiology No results found.  Procedures Procedures (including critical care time)  Medications Ordered in ED Medications  dexamethasone (DECADRON) injection 10 mg (10 mg Intramuscular Given 09/25/18 0847)     Initial Impression / Assessment and Plan / ED Course  I have reviewed the triage vital signs and the nursing notes.  Pertinent labs & imaging results that were available during my care of the patient were reviewed by me and considered in my medical decision making (see chart for details).  53 y.o. yo male with presents with pruritic rash onset [place]. No facial or oral angioedema.  No respiratory compromise. Possible exposure to allergen possibly from swimming.  No new topical hygiene products used.  No new medications.  No evidence of animal bite wounds.  No fluctuance, induration that would suggest abscess or cellulitis.Considered dermatological emergency including EM, SJS, meningococcemia unlikely.  Patient otherwise feels well and at baseline, no URI or viral type systemic symptoms that would suggest viral exanthem.  Suspect urticaria secondary to environmental allergen. Patient will be discharged with antihistamines, prednisone taper, ice and close f/u with PCP.  Strict ED return precautions given, mother is aware of red flag symptoms that would warrant return to ED for further evaluation and tx.     Final Clinical  Impressions(s) / ED Diagnoses   Final diagnoses:  Rash    ED Discharge Orders         Ordered    predniSONE (STERAPRED UNI-PAK 21 TAB) 10 MG (21) TBPK tablet  Daily     09/25/18 0841    hydrOXYzine (ATARAX/VISTARIL) 25 MG tablet  Every 6 hours     09/25/18 0841           Kinnie Feil, PA-C 09/25/18 1124    Maudie Flakes, MD 09/26/18 [phone]

## 2018-09-25 NOTE — ED Notes (Signed)
Pt also complains of bliateral foot/leg swelling and itching

## 2018-09-25 NOTE — Discharge Instructions (Addendum)
You were seen in the ER for itchy rash  The exact cause is unclear but since it is itchy and mostly in your extremities, after swimming this could be contact dermatitis or sensitivity reaction to some allergen  We will treat this with steroids, antihistamines  Take prednisone as prescribed and until completed.  Use hydroxyzine which will help with itching every 6 hours, do not take benadryl with this medicine.  Take over the counter famotidine (pepcid) 10 mg AM and PM for the next 5 days. Use only water and gentle soap to clean skin. Avoid any other topical hygiene products until rash goes away  Return for worsening rash despite medicines, fever, vomiting, diarrhea, facial lip tongue throat swelling, chest pain shortness of breath

## 2018-09-25 NOTE — ED Triage Notes (Signed)
Patient here with rash on legs, hips, trunk and arms.  He states that he went swimming in [facility] over the weekend.  Denies any yard work, no changes in detergents.  Patient has tried many OTC creams without relief of his itching.

## 2018-10-11 ENCOUNTER — Emergency Department (HOSPITAL_COMMUNITY): Payer: Self-pay

## 2018-10-11 ENCOUNTER — Emergency Department (HOSPITAL_COMMUNITY)
Admission: EM | Admit: 2018-10-11 | Discharge: 2018-10-11 | Disposition: A | Discharge status: Home / Self Care | Payer: Self-pay | Attending: Emergency Medicine | Admitting: Emergency Medicine

## 2018-10-11 ENCOUNTER — Encounter (HOSPITAL_COMMUNITY): Payer: Self-pay

## 2018-10-11 ENCOUNTER — Other Ambulatory Visit: Payer: Self-pay

## 2018-10-11 DIAGNOSIS — F17210 Nicotine dependence, cigarettes, uncomplicated: Secondary | ICD-10-CM | POA: Insufficient documentation

## 2018-10-11 DIAGNOSIS — Z79899 Other long term (current) drug therapy: Secondary | ICD-10-CM | POA: Insufficient documentation

## 2018-10-11 DIAGNOSIS — E119 Type 2 diabetes mellitus without complications: Secondary | ICD-10-CM | POA: Insufficient documentation

## 2018-10-11 DIAGNOSIS — K529 Noninfective gastroenteritis and colitis, unspecified: Secondary | ICD-10-CM

## 2018-10-11 LAB — COMPREHENSIVE METABOLIC PANEL
ALT: 55 U/L — ABNORMAL HIGH (ref 0–44)
AST: 61 U/L — ABNORMAL HIGH (ref 15–41)
Albumin: 3.6 g/dL (ref 3.5–5.0)
Alkaline Phosphatase: 100 U/L (ref 38–126)
Anion gap: 13 (ref 5–15)
BUN: 22 mg/dL — ABNORMAL HIGH (ref 6–20)
CO2: 26 mmol/L (ref 22–32)
Calcium: 8.6 mg/dL — ABNORMAL LOW (ref 8.9–10.3)
Chloride: 101 mmol/L (ref 98–111)
Creatinine, Ser: 1.42 mg/dL — ABNORMAL HIGH (ref 0.61–1.24)
GFR calc Af Amer: >60 mL/min (ref >60)
GFR calc non Af Amer: 56 mL/min — ABNORMAL LOW (ref >60)
Glucose, Bld: 115 mg/dL — ABNORMAL HIGH (ref 70–99)
Potassium: 3.6 mmol/L (ref 3.5–5.1)
Sodium: 140 mmol/L (ref 135–145)
Total Bilirubin: 1.1 mg/dL (ref 0.3–1.2)
Total Protein: 6.5 g/dL (ref 6.5–8.1)

## 2018-10-11 LAB — URINALYSIS, ROUTINE W REFLEX MICROSCOPIC
Bilirubin Urine: NEGATIVE
Glucose, UA: NEGATIVE mg/dL
Hgb urine dipstick: NEGATIVE
Ketones, ur: NEGATIVE mg/dL
Leukocytes,Ua: NEGATIVE
Nitrite: NEGATIVE
Protein, ur: NEGATIVE mg/dL
Specific Gravity, Urine: 1.018 (ref 1.005–1.030)
pH: 6 (ref 5.0–8.0)

## 2018-10-11 LAB — CBC
HCT: 45.7 % (ref 39.0–52.0)
Hemoglobin: 15.5 g/dL (ref 13.0–17.0)
MCH: 32 pg (ref 26.0–34.0)
MCHC: 33.9 g/dL (ref 30.0–36.0)
MCV: 94.4 fL (ref 80.0–100.0)
Platelets: 134 10*3/uL — ABNORMAL LOW (ref 150–400)
RBC: 4.84 MIL/uL (ref 4.22–5.81)
RDW: 13.2 % (ref 11.5–15.5)
WBC: 5 10*3/uL (ref 4.0–10.5)
nRBC: 0 % (ref 0.0–0.2)

## 2018-10-11 LAB — ETHANOL: Alcohol, Ethyl (B): 19 mg/dL — ABNORMAL HIGH (ref <10)

## 2018-10-11 LAB — LIPASE, BLOOD: Lipase: 44 U/L (ref 11–51)

## 2018-10-11 MED ORDER — SODIUM CHLORIDE 0.9 % IV BOLUS
1000.0000 mL | Freq: Once | INTRAVENOUS | Status: AC
  Administered 2018-10-11: 1000 mL via INTRAVENOUS

## 2018-10-11 MED ORDER — SODIUM CHLORIDE (PF) 0.9 % IJ SOLN
INTRAMUSCULAR | Status: AC
  Filled 2018-10-11: qty 50

## 2018-10-11 MED ORDER — IOHEXOL 300 MG/ML  SOLN
100.0000 mL | Freq: Once | INTRAMUSCULAR | Status: AC | PRN
  Administered 2018-10-11: 100 mL via INTRAVENOUS

## 2018-10-11 MED ORDER — PROMETHAZINE HCL 25 MG PO TABS: 25.0000 mg | ORAL_TABLET | Freq: Four times a day (QID) | ORAL | 0 refills | Status: DC | PRN

## 2018-10-11 MED ORDER — METOCLOPRAMIDE HCL 5 MG/ML IJ SOLN
10.0000 mg | Freq: Once | INTRAMUSCULAR | Status: AC
  Administered 2018-10-11: 10 mg via INTRAVENOUS
  Filled 2018-10-11: qty 2

## 2018-10-11 MED ORDER — METRONIDAZOLE 500 MG PO TABS: 500.0000 mg | ORAL_TABLET | Freq: Two times a day (BID) | ORAL | 0 refills | Status: DC

## 2018-10-11 MED ORDER — KETOROLAC TROMETHAMINE 30 MG/ML IJ SOLN
30.0000 mg | Freq: Once | INTRAMUSCULAR | Status: AC
  Administered 2018-10-11: 30 mg via INTRAVENOUS
  Filled 2018-10-11: qty 1

## 2018-10-11 MED ORDER — SODIUM CHLORIDE 0.9% FLUSH
3.0000 mL | Freq: Once | INTRAVENOUS | Status: AC
  Administered 2018-10-11: 3 mL via INTRAVENOUS

## 2018-10-11 MED ORDER — CIPROFLOXACIN HCL 500 MG PO TABS: 500.0000 mg | ORAL_TABLET | Freq: Two times a day (BID) | ORAL | 0 refills | Status: DC

## 2018-10-11 NOTE — ED Notes (Signed)
Pt provided w/labeled urine specimen cup. Advised pt to provide sample when possible and submit to staff for processing. [employer]

## 2018-10-11 NOTE — ED Provider Notes (Signed)
[facility] DEPT Provider Note   CSN: [id number] Arrival date & time: 10/11/18  1624     History   Chief Complaint Chief Complaint  Patient presents with  . Abdominal Pain  . Headache    HPI Brandon Miller is a 53 y.o. male.     HPI Patient presents to the emergency department with abdominal discomfort and not feeling well over the last 3 weeks.  The patient states that he has not had any diarrhea but he states that his stools have been very foul.  Patient states that he had a rash for about 3 weeks as well.  Patient states that he was given prednisone which may or may not have helped.  Patient states he did not take any other medications prior to arrival for his symptoms.  The patient denies chest pain, shortness of breath, headache,blurred vision, neck pain, fever, cough, weakness, numbness, dizziness, anorexia, edema, vomiting, diarrhea, rash, back pain, dysuria, hematemesis, bloody stool, near syncope, or syncope.  Patient states the symptoms seem to start after he was [place]. Past Medical History:  Diagnosis Date  . Allergy   . Anemia   . Anxiety   . Arthritis    RA  . Complication of anesthesia    hard to wake up-was told he may have sleep apnea-never tested  . Depression   . Diabetes mellitus without complication ([place])   . Sleep apnea   . Snores     Patient Active Problem List   Diagnosis Date Noted  . Severe recurrent major depression without psychotic features ([place]) 08/15/2018  . Alcohol withdrawal seizure with perceptual disturbance ([place])   . Alcohol abuse with unspecified alcohol-induced disorder ([place]) 02/05/2018  . Transaminasemia 02/05/2018  . Tobacco abuse 02/05/2018  . Depression   . Alcohol withdrawal seizure without complication ([place]) 123456  . DJD (degenerative joint disease) of knee 08/25/2014  . Diabetes mellitus, type 2 ([place]) 10/31/2011  . Hyperglycemia 10/31/2011  . PNA (pneumonia) 10/30/2011  . LEG  PAIN, RIGHT 01/20/2010    Past Surgical History:  Procedure Laterality Date  . ELBOW SURGERY Right 2010   Dr. Percell Miller  . HERNIA REPAIR    . KNEE ARTHROSCOPY WITH MEDIAL MENISECTOMY Right 06/11/2013   Procedure: RIGHT KNEE ARTHROSCOPY WITH MEDIAL MENISECTOMY AND CHONDROPLASTY;  Surgeon: Ninetta Lights, MD;  Location: [facility];  Service: Orthopedics;  Laterality: Right;  Chondroplasty, loose bodies, medial menisectomy, lysis of adhesions.  Marland Kitchen KNEE SURGERY Right 2011  . right ankle    . SHOULDER SURGERY Right 2010   Dr. Percell Miller  . TOTAL KNEE ARTHROPLASTY Right 08/25/2014   Procedure: TOTAL KNEE ARTHROPLASTY;  Surgeon: Ninetta Lights, MD;  Location: [facility];  Service: Orthopedics;  Laterality: Right;        Home Medications    Prior to Admission medications   Medication Sig Start Date End Date Taking? Authorizing Provider  ADDERALL XR 25 MG 24 hr capsule Take 25 mg by mouth daily. 09/13/18  Yes [provider]  Aspirin-Acetaminophen-Caffeine (GOODYS EXTRA STRENGTH PO) Take 1 Package by mouth daily as needed (headache).   Yes [provider]  LORazepam (ATIVAN) 1 MG tablet Take 1 mg by mouth 2 (two) times daily as needed for anxiety. 10/01/18  Yes [provider]  traZODone (DESYREL) 150 MG tablet Take 1 tablet (150 mg total) by mouth at bedtime. 08/18/18  Yes Johnn Hai, MD  calcium-vitamin D 250-100 MG-UNIT tablet Take 1 tablet by  mouth 2 (two) times daily. Patient not taking: Reported on 02/04/2018 01/27/18   Ripley Fraise, MD  chlordiazePOXIDE (LIBRIUM) 25 MG capsule 50mg  PO TID x 1D, then 25-50mg  PO BID X 1D, then 25-50mg  PO QD X 1D Patient not taking: Reported on 10/11/2018 08/26/18   Veryl Speak, MD  gabapentin (NEURONTIN) 400 MG capsule Take 1 capsule (400 mg total) by mouth 3 (three) times daily. Patient not taking: Reported on 10/11/2018 08/18/18 08/18/19  Johnn Hai, MD  hydrOXYzine (ATARAX/VISTARIL) 25 MG tablet Take 1 tablet (25 mg total)  by mouth every 6 (six) hours. Patient not taking: Reported on 10/11/2018 09/25/18   Kinnie Feil, PA-C  predniSONE (STERAPRED UNI-PAK 21 TAB) 10 MG (21) TBPK tablet Take by mouth daily. Take 6 tabs by mouth daily  for 2 days, then 5 tabs for 2 days, then 4 tabs for 2 days, then 3 tabs for 2 days, 2 tabs for 2 days, then 1 tab by mouth daily for 2 days Patient not taking: Reported on 10/11/2018 09/25/18   Kinnie Feil, PA-C  thiamine 100 MG tablet Take 1 tablet (100 mg total) by mouth daily. Patient not taking: Reported on 08/14/2018 02/06/18   Orson Eva, MD    Family History History reviewed. No pertinent family history.  Social History Social History   Tobacco Use  . Smoking status: Current Every Day Smoker    Packs/day: 0.50    Years: 25.00    Pack years: 12.50    Types: Cigarettes  . Smokeless tobacco: Never Used  Substance Use Topics  . Alcohol use: Yes    Comment: occasionally  . Drug use: No     Allergies   Daypro [oxaprozin], Diclofenac sodium, Indocin [indomethacin], and Zanaflex [tizanidine hcl]   Review of Systems Review of Systems All other systems negative except as documented in the HPI. All pertinent positives and negatives as reviewed in the HPI.  Physical Exam Updated Vital Signs BP 140/88   Pulse 61   Temp 98.2 F (36.8 C) (Oral)   Resp 12   Ht 6\' 2"  (1.88 m)   Wt 91.6 kg   SpO2 99%   BMI 25.94 kg/m   Physical Exam Vitals signs and nursing note reviewed.  Constitutional:      General: He is not in acute distress.    Appearance: He is well-developed.  HENT:     Head: Normocephalic and atraumatic.  Eyes:     Pupils: Pupils are equal, round, and reactive to light.  Neck:     Musculoskeletal: Normal range of motion and neck supple.  Cardiovascular:     Rate and Rhythm: Normal rate and regular rhythm.     Heart sounds: Normal heart sounds. No murmur. No friction rub. No gallop.   Pulmonary:     Effort: Pulmonary effort is normal. No  respiratory distress.     Breath sounds: Normal breath sounds. No wheezing.  Abdominal:     General: Bowel sounds are normal. There is no distension.     Palpations: Abdomen is soft.     Tenderness: There is no abdominal tenderness.  Skin:    General: Skin is warm and dry.     Capillary Refill: Capillary refill takes less than 2 seconds.     Findings: No erythema or rash.  Neurological:     Mental Status: He is alert and oriented to person, place, and time.     Motor: No abnormal muscle tone.     Coordination: Coordination  normal.  Psychiatric:        Behavior: Behavior normal.      ED Treatments / Results  Labs (all labs ordered are listed, but only abnormal results are displayed) Labs Reviewed  COMPREHENSIVE METABOLIC PANEL - Abnormal; Notable for the following components:      Result Value   Glucose, Bld 115 (*)    BUN 22 (*)    Creatinine, Ser 1.42 (*)    Calcium 8.6 (*)    AST 61 (*)    ALT 55 (*)    GFR calc non Af Amer 56 (*)    All other components within normal limits  CBC - Abnormal; Notable for the following components:   Platelets 134 (*)    All other components within normal limits  ETHANOL - Abnormal; Notable for the following components:   Alcohol, Ethyl (B) 19 (*)    All other components within normal limits  LIPASE, BLOOD  URINALYSIS, ROUTINE W REFLEX MICROSCOPIC    EKG None  Radiology Ct Abdomen Pelvis W Contrast  Result Date: 10/11/2018 CLINICAL DATA:  Acute abdominal pain, rash EXAM: CT ABDOMEN AND PELVIS WITH CONTRAST TECHNIQUE: Multidetector CT imaging of the abdomen and pelvis was performed using the standard protocol following bolus administration of intravenous contrast. CONTRAST:  156mL OMNIPAQUE IOHEXOL 300 MG/ML  SOLN COMPARISON:  None. FINDINGS: Lower chest: No acute abnormality. Hepatobiliary: No focal liver abnormality is seen. No gallstones, gallbladder wall thickening, or biliary dilatation. Pancreas: Unremarkable. No pancreatic  ductal dilatation or surrounding inflammatory changes. Spleen: Normal in size without focal abnormality. Adrenals/Urinary Tract: Adrenal glands are unremarkable. Kidneys are normal, without renal calculi, focal lesion, or hydronephrosis. Bladder is unremarkable. Stomach/Bowel: Stomach is within normal limits. Appendix appears normal. There is mild circumferential thickening the distal sigmoid colon and rectum. No pericolonic inflammatory changes. No dilated loops of bowel. Vascular/Lymphatic: No significant vascular findings are present. No enlarged abdominal or pelvic lymph nodes. Reproductive: Prostate is unremarkable. Other: No abdominal wall hernia or abnormality. No abdominopelvic ascites. Musculoskeletal: Geographic sclerotic and lucent changes of the superior right femoral head suggesting AVN. Femoral head contour is maintained without collapse. No fractures. IMPRESSION: 1. Mild circumferential thickening of the rectum and distal sigmoid colon suggesting a nonspecific colitis. 2. Findings suggestive of avascular necrosis of the right femoral head without subcortical collapse. Electronically Signed   By: Davina Poke M.D.   On: 10/11/2018 21:20    Procedures Procedures (including critical care time)  Medications Ordered in ED Medications  sodium chloride flush (NS) 0.9 % injection 3 mL (has no administration in time range)  sodium chloride (PF) 0.9 % injection (has no administration in time range)  sodium chloride 0.9 % bolus 1,000 mL (0 mLs Intravenous Stopped 10/11/18 2132)  iohexol (OMNIPAQUE) 300 MG/ML solution 100 mL (100 mLs Intravenous Contrast Given 10/11/18 2054)     Initial Impression / Assessment and Plan / ED Course  I have reviewed the triage vital signs and the nursing notes.  Pertinent labs & imaging results that were available during my care of the patient were reviewed by me and considered in my medical decision making (see chart for details).        Patient is been  given IV fluids and has tolerated oral fluids here in the emergency department.  There is noted potential colitis on the CT scan and I feel that this will need treatment with antibiotics.  Patient does drink alcohol on a fairly regular basis I feel that this also  contributes to his symptoms.  Patient will be treated for this.  Have advised him he will need to follow-up with his primary doctor.  Told to return here as needed.  Final Clinical Impressions(s) / ED Diagnoses   Final diagnoses:  None    ED Discharge Orders    None       Dalia Heading, PA-C 10/11/18 2150    Francine Graven, DO 10/12/18 1631

## 2018-10-11 NOTE — Discharge Instructions (Signed)
Return here as needed.  Follow-up with your primary doctor.  Increase your fluid intake as much as possible.

## 2018-10-11 NOTE — ED Triage Notes (Signed)
Pt states that he had a rash on his abd. Pt states the rash hasn't cleared up and he has continued to feel worse. Pt states the prednisone hasn't helped. Pt states that he is having abd pain and headache. Pt endorses nausea. Denies diarrhea, but endorses "nasty" stools.  Pt is tearful in triage.

## 2018-10-11 NOTE — ED Notes (Signed)
ED Provider at bedside. 

## 2018-10-22 ENCOUNTER — Other Ambulatory Visit: Payer: Self-pay

## 2018-10-22 ENCOUNTER — Emergency Department (HOSPITAL_COMMUNITY): Payer: Self-pay

## 2018-10-22 ENCOUNTER — Emergency Department (HOSPITAL_COMMUNITY)
Admission: EM | Admit: 2018-10-22 | Discharge: 2018-10-22 | Disposition: A | Discharge status: Home / Self Care | Payer: Self-pay | Attending: Emergency Medicine | Admitting: Emergency Medicine

## 2018-10-22 ENCOUNTER — Encounter (HOSPITAL_COMMUNITY): Payer: Self-pay | Admitting: *Deleted

## 2018-10-22 DIAGNOSIS — Z96651 Presence of right artificial knee joint: Secondary | ICD-10-CM | POA: Insufficient documentation

## 2018-10-22 DIAGNOSIS — E119 Type 2 diabetes mellitus without complications: Secondary | ICD-10-CM | POA: Insufficient documentation

## 2018-10-22 DIAGNOSIS — K759 Inflammatory liver disease, unspecified: Secondary | ICD-10-CM | POA: Insufficient documentation

## 2018-10-22 DIAGNOSIS — Z79899 Other long term (current) drug therapy: Secondary | ICD-10-CM | POA: Insufficient documentation

## 2018-10-22 DIAGNOSIS — R1084 Generalized abdominal pain: Secondary | ICD-10-CM

## 2018-10-22 DIAGNOSIS — F17210 Nicotine dependence, cigarettes, uncomplicated: Secondary | ICD-10-CM | POA: Insufficient documentation

## 2018-10-22 DIAGNOSIS — K529 Noninfective gastroenteritis and colitis, unspecified: Secondary | ICD-10-CM | POA: Insufficient documentation

## 2018-10-22 DIAGNOSIS — F1010 Alcohol abuse, uncomplicated: Secondary | ICD-10-CM | POA: Insufficient documentation

## 2018-10-22 LAB — CBC
HCT: 46.1 % (ref 39.0–52.0)
Hemoglobin: 16.3 g/dL (ref 13.0–17.0)
MCH: 32.2 pg (ref 26.0–34.0)
MCHC: 35.4 g/dL (ref 30.0–36.0)
MCV: 91.1 fL (ref 80.0–100.0)
Platelets: 178 10*3/uL (ref 150–400)
RBC: 5.06 MIL/uL (ref 4.22–5.81)
RDW: 13.3 % (ref 11.5–15.5)
WBC: 6.7 10*3/uL (ref 4.0–10.5)
nRBC: 0 % (ref 0.0–0.2)

## 2018-10-22 LAB — COMPREHENSIVE METABOLIC PANEL
ALT: 124 U/L — ABNORMAL HIGH (ref 0–44)
AST: 363 U/L — ABNORMAL HIGH (ref 15–41)
Albumin: 3.2 g/dL — ABNORMAL LOW (ref 3.5–5.0)
Alkaline Phosphatase: 183 U/L — ABNORMAL HIGH (ref 38–126)
Anion gap: 9 (ref 5–15)
BUN: 16 mg/dL (ref 6–20)
CO2: 25 mmol/L (ref 22–32)
Calcium: 8.4 mg/dL — ABNORMAL LOW (ref 8.9–10.3)
Chloride: 100 mmol/L (ref 98–111)
Creatinine, Ser: 1.02 mg/dL (ref 0.61–1.24)
GFR calc Af Amer: >60 mL/min (ref >60)
GFR calc non Af Amer: >60 mL/min (ref >60)
Glucose, Bld: 84 mg/dL (ref 70–99)
Potassium: 4 mmol/L (ref 3.5–5.1)
Sodium: 134 mmol/L — ABNORMAL LOW (ref 135–145)
Total Bilirubin: 1.3 mg/dL — ABNORMAL HIGH (ref 0.3–1.2)
Total Protein: 6.2 g/dL — ABNORMAL LOW (ref 6.5–8.1)

## 2018-10-22 LAB — ETHANOL: Alcohol, Ethyl (B): <10 mg/dL (ref <10)

## 2018-10-22 LAB — LIPASE, BLOOD: Lipase: 43 U/L (ref 11–51)

## 2018-10-22 MED ORDER — ONDANSETRON HCL 4 MG/2ML IJ SOLN
4.0000 mg | Freq: Once | INTRAMUSCULAR | Status: AC
  Administered 2018-10-22: 22:00:00 4 mg via INTRAVENOUS
  Filled 2018-10-22: qty 2

## 2018-10-22 MED ORDER — LOPERAMIDE HCL 2 MG PO TABS: 2.0000 mg | ORAL_TABLET | Freq: Four times a day (QID) | ORAL | 0 refills | Status: DC | PRN

## 2018-10-22 MED ORDER — IOHEXOL 300 MG/ML  SOLN
100.0000 mL | Freq: Once | INTRAMUSCULAR | Status: AC | PRN
  Administered 2018-10-22: 100 mL via INTRAVENOUS

## 2018-10-22 MED ORDER — SODIUM CHLORIDE 0.9 % IV BOLUS
1000.0000 mL | Freq: Once | INTRAVENOUS | Status: AC
  Administered 2018-10-22: 1000 mL via INTRAVENOUS

## 2018-10-22 MED ORDER — ONDANSETRON 4 MG PO TBDP: 4.0000 mg | ORAL_TABLET | Freq: Three times a day (TID) | ORAL | 1 refills | Status: DC | PRN

## 2018-10-22 MED ORDER — HYDROMORPHONE HCL 1 MG/ML IJ SOLN
1.0000 mg | Freq: Once | INTRAMUSCULAR | Status: AC
  Administered 2018-10-22: 1 mg via INTRAVENOUS
  Filled 2018-10-22: qty 1

## 2018-10-22 MED ORDER — SODIUM CHLORIDE 0.9 % IV BOLUS: 500.0000 mL | Freq: Once | INTRAVENOUS | Status: DC

## 2018-10-22 MED ORDER — SODIUM CHLORIDE 0.9 % IV SOLN: INTRAVENOUS | Status: DC

## 2018-10-22 MED ORDER — HYDROCODONE-ACETAMINOPHEN 5-325 MG PO TABS: 2.0000 | ORAL_TABLET | ORAL | 0 refills | Status: DC | PRN

## 2018-10-22 MED ORDER — SODIUM CHLORIDE 0.9% FLUSH
3.0000 mL | Freq: Once | INTRAVENOUS | Status: AC
  Administered 2018-10-22: 21:00:00 3 mL via INTRAVENOUS

## 2018-10-22 NOTE — ED Triage Notes (Signed)
Pt c/o abd pain with n/v/d, was seen at [facility] long er a week ago and was diagnosed with colitis, given antibiotics but reports that he has not gotten any better,

## 2018-10-22 NOTE — ED Provider Notes (Addendum)
[facility] EMERGENCY DEPARTMENT Provider Note   CSN: [id number] Arrival date & time: 10/22/18  2011     History   Chief Complaint Chief Complaint  Patient presents with  . Abdominal Pain    HPI Brandon Miller is a 53 y.o. male.   Patient seen [facility] long emergency department September 19 for similar symptoms.  However did not have nausea vomiting and diarrhea at that time.  Patient has a history of alcohol abuse.  His alcohol level on that day was 17.  Patient had CT scan abdomen done which showed some distal colon colitis.  Was treated with Flagyl and Cipro.  Patient states he is no better.  Patient with persistent generalized abdominal pain now with nausea vomiting and diarrhea but no blood in either 1.  Says bowel movements are kind of mucousy in nature.  Patient denies any recent drinking.     Past Medical History:  Diagnosis Date  . Allergy   . Anemia   . Anxiety   . Arthritis    RA  . Complication of anesthesia    hard to wake up-was told he may have sleep apnea-never tested  . Depression   . Diabetes mellitus without complication ([place])   . Sleep apnea   . Snores     Patient Active Problem List   Diagnosis Date Noted  . Severe recurrent major depression without psychotic features ([place]) 08/15/2018  . Alcohol withdrawal seizure with perceptual disturbance ([place])   . Alcohol abuse with unspecified alcohol-induced disorder ([place]) 02/05/2018  . Transaminasemia 02/05/2018  . Tobacco abuse 02/05/2018  . Depression   . Alcohol withdrawal seizure without complication ([place]) [id number]  . DJD (degenerative joint disease) of knee 08/25/2014  . Diabetes mellitus, type 2 ([place]) 10/31/2011  . Hyperglycemia 10/31/2011  . PNA (pneumonia) 10/30/2011  . LEG PAIN, RIGHT 01/20/2010    Past Surgical History:  Procedure Laterality Date  . ELBOW SURGERY Right 2010   Dr. Percell Miller  . HERNIA REPAIR    . KNEE ARTHROSCOPY WITH MEDIAL MENISECTOMY Right 06/11/2013   Procedure:  RIGHT KNEE ARTHROSCOPY WITH MEDIAL MENISECTOMY AND CHONDROPLASTY;  Surgeon: Ninetta Lights, MD;  Location: [facility];  Service: Orthopedics;  Laterality: Right;  Chondroplasty, loose bodies, medial menisectomy, lysis of adhesions.  Marland Kitchen KNEE SURGERY Right 2011  . right ankle    . SHOULDER SURGERY Right 2010   Dr. Percell Miller  . TOTAL KNEE ARTHROPLASTY Right 08/25/2014   Procedure: TOTAL KNEE ARTHROPLASTY;  Surgeon: Ninetta Lights, MD;  Location: [facility];  Service: Orthopedics;  Laterality: Right;        Home Medications    Prior to Admission medications   Medication Sig Start Date End Date Taking? Authorizing Provider  ADDERALL XR 25 MG 24 hr capsule Take 25 mg by mouth daily. 09/13/18  Yes [provider]  calcium-vitamin D 250-100 MG-UNIT tablet Take 1 tablet by mouth 2 (two) times daily. 01/27/18  Yes Ripley Fraise, MD  LORazepam (ATIVAN) 1 MG tablet Take 1 mg by mouth 2 (two) times daily as needed for anxiety. 10/01/18  Yes [provider]  thiamine 100 MG tablet Take 1 tablet (100 mg total) by mouth daily. 02/06/18  Yes Tat, Shanon Brow, MD  chlordiazePOXIDE (LIBRIUM) 25 MG capsule 50mg  PO TID x 1D, then 25-50mg  PO BID X 1D, then 25-50mg  PO QD X 1D Patient not taking: Reported on 10/11/2018 08/26/18   Veryl Speak, MD  ciprofloxacin (CIPRO) 500 MG tablet Take 1 tablet (  500 mg total) by mouth 2 (two) times daily. Patient not taking: Reported on 10/22/2018 10/11/18   Dalia Heading, PA-C  gabapentin (NEURONTIN) 400 MG capsule Take 1 capsule (400 mg total) by mouth 3 (three) times daily. Patient not taking: Reported on 10/11/2018 08/18/18 08/18/19  Johnn Hai, MD  hydrOXYzine (ATARAX/VISTARIL) 25 MG tablet Take 1 tablet (25 mg total) by mouth every 6 (six) hours. Patient not taking: Reported on 10/11/2018 09/25/18   Kinnie Feil, PA-C  metroNIDAZOLE (FLAGYL) 500 MG tablet Take 1 tablet (500 mg total) by mouth 2 (two) times daily. Patient not taking: Reported on  10/22/2018 10/11/18   Lawyer, Harrell Gave, PA-C  predniSONE (STERAPRED UNI-PAK 21 TAB) 10 MG (21) TBPK tablet Take by mouth daily. Take 6 tabs by mouth daily  for 2 days, then 5 tabs for 2 days, then 4 tabs for 2 days, then 3 tabs for 2 days, 2 tabs for 2 days, then 1 tab by mouth daily for 2 days Patient not taking: Reported on 10/11/2018 09/25/18   Kinnie Feil, PA-C  promethazine (PHENERGAN) 25 MG tablet Take 1 tablet (25 mg total) by mouth every 6 (six) hours as needed for nausea or vomiting. Patient not taking: Reported on 10/22/2018 10/11/18   Dalia Heading, PA-C  traZODone (DESYREL) 150 MG tablet Take 1 tablet (150 mg total) by mouth at bedtime. Patient not taking: Reported on 10/22/2018 08/18/18   Johnn Hai, MD    Family History No family history on file.  Social History Social History   Tobacco Use  . Smoking status: Current Every Day Smoker    Packs/day: 0.50    Years: 25.00    Pack years: 12.50    Types: Cigarettes  . Smokeless tobacco: Never Used  Substance Use Topics  . Alcohol use: Yes    Comment: occasionally  . Drug use: No     Allergies   Daypro [oxaprozin], Diclofenac sodium, Indocin [indomethacin], and Zanaflex [tizanidine hcl]   Review of Systems Review of Systems  Constitutional: Negative for chills and fever.  HENT: Negative for congestion, rhinorrhea and sore throat.   Eyes: Negative for visual disturbance.  Respiratory: Negative for cough and shortness of breath.   Cardiovascular: Negative for chest pain and leg swelling.  Gastrointestinal: Positive for abdominal pain, diarrhea, nausea and vomiting.  Genitourinary: Negative for dysuria.  Musculoskeletal: Negative for back pain and neck pain.  Skin: Negative for rash.  Neurological: Negative for dizziness, light-headedness and headaches.  Hematological: Does not bruise/bleed easily.  Psychiatric/Behavioral: Negative for confusion.     Physical Exam Updated Vital Signs BP 123/81   Pulse  (!) 59   Temp (!) 97.5 F (36.4 C) (Oral)   Resp 19   Ht 1.88 m (6\' 2" )   Wt 91.6 kg   SpO2 99%   BMI 25.93 kg/m   Physical Exam Vitals signs and nursing note reviewed.  Constitutional:      General: He is not in acute distress.    Appearance: He is well-developed. He is not toxic-appearing.  HENT:     Head: Normocephalic and atraumatic.  Eyes:     Extraocular Movements: Extraocular movements intact.     Conjunctiva/sclera: Conjunctivae normal.     Pupils: Pupils are equal, round, and reactive to light.  Neck:     Musculoskeletal: Normal range of motion and neck supple.  Cardiovascular:     Rate and Rhythm: Normal rate and regular rhythm.     Heart sounds: No murmur.  Pulmonary:  Effort: Pulmonary effort is normal. No respiratory distress.     Breath sounds: Normal breath sounds.  Abdominal:     General: Bowel sounds are normal. There is no distension.     Palpations: Abdomen is soft.     Tenderness: There is no abdominal tenderness.  Musculoskeletal: Normal range of motion.        General: No swelling.  Skin:    General: Skin is warm and dry.  Neurological:     General: No focal deficit present.     Mental Status: He is alert and oriented to person, place, and time.      ED Treatments / Results  Labs (all labs ordered are listed, but only abnormal results are displayed) Labs Reviewed  COMPREHENSIVE METABOLIC PANEL - Abnormal; Notable for the following components:      Result Value   Sodium 134 (*)    Calcium 8.4 (*)    Total Protein 6.2 (*)    Albumin 3.2 (*)    AST 363 (*)    ALT 124 (*)    Alkaline Phosphatase 183 (*)    Total Bilirubin 1.3 (*)    All other components within normal limits  LIPASE, BLOOD  CBC  ETHANOL  URINALYSIS, ROUTINE W REFLEX MICROSCOPIC  HEPATITIS PANEL, ACUTE   Results for orders placed or performed during the hospital encounter of 10/22/18  Lipase, blood  Result Value Ref Range   Lipase 43 11 - 51 U/L  Comprehensive  metabolic panel  Result Value Ref Range   Sodium 134 (L) 135 - 145 mmol/L   Potassium 4.0 3.5 - 5.1 mmol/L   Chloride 100 98 - 111 mmol/L   CO2 25 22 - 32 mmol/L   Glucose, Bld 84 70 - 99 mg/dL   BUN 16 6 - 20 mg/dL   Creatinine, Ser 1.02 0.61 - 1.24 mg/dL   Calcium 8.4 (L) 8.9 - 10.3 mg/dL   Total Protein 6.2 (L) 6.5 - 8.1 g/dL   Albumin 3.2 (L) 3.5 - 5.0 g/dL   AST 363 (H) 15 - 41 U/L   ALT 124 (H) 0 - 44 U/L   Alkaline Phosphatase 183 (H) 38 - 126 U/L   Total Bilirubin 1.3 (H) 0.3 - 1.2 mg/dL   GFR calc non Af Amer >60 >60 mL/min   GFR calc Af Amer >60 >60 mL/min   Anion gap 9 5 - 15  CBC  Result Value Ref Range   WBC 6.7 4.0 - 10.5 K/uL   RBC 5.06 4.22 - 5.81 MIL/uL   Hemoglobin 16.3 13.0 - 17.0 g/dL   HCT 46.1 39.0 - 52.0 %   MCV 91.1 80.0 - 100.0 fL   MCH 32.2 26.0 - 34.0 pg   MCHC 35.4 30.0 - 36.0 g/dL   RDW 13.3 11.5 - 15.5 %   Platelets 178 150 - 400 K/uL   nRBC 0.0 0.0 - 0.2 %  Ethanol  Result Value Ref Range   Alcohol, Ethyl (B) <10 <10 mg/dL     EKG None  Radiology Ct Abdomen Pelvis W Contrast  Result Date: 10/22/2018 CLINICAL DATA:  53 year old male with abdominal pain, nausea vomiting. Possible distal colitis on CT Abdomen and Pelvis earlier this month. No improvement since that time. EXAM: CT ABDOMEN AND PELVIS WITH CONTRAST TECHNIQUE: Multidetector CT imaging of the abdomen and pelvis was performed using the standard protocol following bolus administration of intravenous contrast. CONTRAST:  126mL OMNIPAQUE IOHEXOL 300 MG/ML  SOLN COMPARISON:  CT Abdomen  and Pelvis 10/11/2018. FINDINGS: Lower chest: Negative. Hepatobiliary: Liver and gallbladder are within normal limits. Pancreas: Negative. Spleen: Within normal limits. Adrenals/Urinary Tract: Normal adrenal glands. Symmetric bilateral renal enhancement and contrast excretion. Normal proximal ureters. Unremarkable urinary bladder. Stomach/Bowel: Much of the large bowel is decompressed today. The sigmoid  colon is redundant. There is gaseous distension of the mid sigmoid in the right lower quadrant and that segment appears normal on series 2, image 62., but distally the large bowel is decompressed including in the rectum, where there is suggestion of circumferential rectal wall thickening up to 11 millimeters on series 2, image 81. This is progressed from the earlier CT, although there remains no convincing mesenteric stranding in the segments. The descending colon is within normal limits. There is fluid in the transverse colon. The hepatic flexure is redundant containing fluid. The right colon is negative aside from fluid content. Normal appendix on series 2, image 56. Negative terminal ileum. Fluid-filled but nondilated small bowel throughout the abdomen. Decompressed stomach. No free air. No free fluid. Vascular/Lymphatic: Aortoiliac calcified atherosclerosis. Major arterial structures remain patent. Portal venous system is patent. There are questionable small perihepatic varices (such as on series 2, image 22). There may be recanalization of the paraumbilical vein, which appears to enter the falciform ligament on image 21. Reproductive: Negative. Other: No pelvic free fluid. Musculoskeletal: No acute osseous abnormality identified. IMPRESSION: 1. Normal appendix. Fluid throughout non-dilated small bowel and proximal colon to the mid transverse. Distal to that the large bowel is decompressed. And although there is no mesenteric stranding, there is continued evidence of circumferential wall thickening in the distal sigmoid and rectum. Differential considerations are generalized gastroenteritis versus colitis. 2. Suggestion of small caliber perihepatic varices, raising the possibility of portal venous hypertension although the liver does not appear obviously cirrhotic. Electronically Signed   By: Genevie Ann M.D.   On: 10/22/2018 23:06    Procedures Procedures (including critical care time)  Medications Ordered in  ED Medications  0.9 %  sodium chloride infusion ( Intravenous Hold 10/22/18 2202)  sodium chloride flush (NS) 0.9 % injection 3 mL (3 mLs Intravenous Given 10/22/18 2100)  ondansetron (ZOFRAN) injection 4 mg (4 mg Intravenous Given 10/22/18 2201)  HYDROmorphone (DILAUDID) injection 1 mg (1 mg Intravenous Given 10/22/18 2201)  sodium chloride 0.9 % bolus 1,000 mL (0 mLs Intravenous Stopped 10/22/18 2311)  iohexol (OMNIPAQUE) 300 MG/ML solution 100 mL (100 mLs Intravenous Contrast Given 10/22/18 2237)     Initial Impression / Assessment and Plan / ED Course  I have reviewed the triage vital signs and the nursing notes.  Pertinent labs & imaging results that were available during my care of the patient were reviewed by me and considered in my medical decision making (see chart for details).       Patient will have repeat CT scan of the abdomen.  Patient's liver function tests are markedly abnormal compared to before.  With findings that possibly consistent with hepatitis or alcohol hepatitis.  Also bilirubin elevated.  Lipase was normal.  No significant leukocytosis.  Patient's alcohol level not elevated.  Treated here with antinausea medicine and pain medicine.  Disposition be based on CT scan of abdomen which has been completed.  CT scan without any acute findings requiring admission.  He seems to be persistent distal sigmoid rectal colitis.  Certainly no worse than it was before did not improve with the Cipro and Flagyl.  Also liver function test suggestive of hepatitis.  Patient states  he has not had any alcohol since 18 September.  Certainly could represent alcoholic hepatitis.  Will send hepatitis panel.  Will treat patient symptomatically.  Does not have indication for admission have him follow-up with gastroenterology.  Consideration of following hepatitis panel as well as for possible colonoscopy.  We will treat with a short course of hydrocodone.  Zofran for the nausea and vomiting and  Imodium for the diarrhea.  Will not continue antibiotics.  Final Clinical Impressions(s) / ED Diagnoses   Final diagnoses:  Generalized abdominal pain  Gastroenteritis  Alcohol abuse  Colitis  Hepatitis    ED Discharge Orders    None       Fredia Sorrow, MD 10/22/18 [id number]    Fredia Sorrow, MD 10/22/18 [id number]    Fredia Sorrow, MD 10/22/18 [id number]    Fredia Sorrow, MD 10/22/18 2336

## 2018-10-22 NOTE — Discharge Instructions (Signed)
Take the short course of pain medicine as needed.  Prepack provided tonight.  Do not take Tylenol excessively.  Take the Zofran as needed for nausea and vomiting.  Take the Imodium as needed for the diarrhea.  Call gastroenterology for follow-up for the abnormal liver function test suggestive of a type of hepatitis.  And also follow-up for the persistent GI symptoms.  Probably do need a colonoscopy.  Return for any new or worse symptoms.

## 2018-10-23 ENCOUNTER — Encounter: Payer: Self-pay | Admitting: Gastroenterology

## 2018-10-23 ENCOUNTER — Telehealth: Payer: Self-pay

## 2018-10-23 LAB — HEPATITIS PANEL, ACUTE
HCV Ab: NONREACTIVE
Hep A IgM: NONREACTIVE
Hep B C IgM: NONREACTIVE
Hepatitis B Surface Ag: NONREACTIVE

## 2018-10-23 MED FILL — Hydrocodone-Acetaminophen Tab 5-325 MG: ORAL | Qty: 6 | Status: AC

## 2018-10-23 NOTE — Telephone Encounter (Signed)
Ok to schedule ov.  

## 2018-10-23 NOTE — Telephone Encounter (Signed)
PATIENT REFERRED  BY [facility]

## 2018-10-24 NOTE — Telephone Encounter (Signed)
PATIENT SCHEDULED  °

## 2018-10-28 ENCOUNTER — Encounter (HOSPITAL_COMMUNITY): Payer: Self-pay | Admitting: Family Medicine

## 2018-10-28 ENCOUNTER — Emergency Department (HOSPITAL_COMMUNITY)
Admission: EM | Admit: 2018-10-28 | Discharge: 2018-10-28 | Discharge status: Against Medical Advice | Payer: Self-pay | Attending: Emergency Medicine | Admitting: Emergency Medicine

## 2018-10-28 DIAGNOSIS — Z5321 Procedure and treatment not carried out due to patient leaving prior to being seen by health care provider: Secondary | ICD-10-CM | POA: Insufficient documentation

## 2018-10-28 LAB — URINALYSIS, ROUTINE W REFLEX MICROSCOPIC
Bilirubin Urine: NEGATIVE
Glucose, UA: NEGATIVE mg/dL
Hgb urine dipstick: NEGATIVE
Ketones, ur: NEGATIVE mg/dL
Leukocytes,Ua: NEGATIVE
Nitrite: NEGATIVE
Protein, ur: NEGATIVE mg/dL
Specific Gravity, Urine: 1.004 — ABNORMAL LOW (ref 1.005–1.030)
pH: 6 (ref 5.0–8.0)

## 2018-10-28 NOTE — ED Notes (Signed)
Patient informed Joy, NT that he was leaving due to the long wait.

## 2018-10-28 NOTE — ED Notes (Signed)
Pt LWBS after triage due to wait time RN Di Kindle I made aware

## 2018-10-28 NOTE — ED Triage Notes (Signed)
Patient is complaining of lower abd pain with nausea, vomiting, and diarrhea for the last month. Patient describes the pain as sharp and radiates to his lower back.

## 2018-10-29 ENCOUNTER — Other Ambulatory Visit: Payer: Self-pay

## 2018-10-29 ENCOUNTER — Encounter (HOSPITAL_COMMUNITY): Payer: Self-pay | Admitting: Emergency Medicine

## 2018-10-29 ENCOUNTER — Inpatient Hospital Stay (HOSPITAL_COMMUNITY)
Admission: EM | Admit: 2018-10-29 | Discharge: 2018-11-02 | DRG: 392 | Disposition: A | Discharge status: Home Health | Payer: Self-pay | Attending: Family Medicine | Admitting: Family Medicine

## 2018-10-29 ENCOUNTER — Emergency Department (HOSPITAL_COMMUNITY): Payer: Self-pay

## 2018-10-29 DIAGNOSIS — M069 Rheumatoid arthritis, unspecified: Secondary | ICD-10-CM | POA: Diagnosis present

## 2018-10-29 DIAGNOSIS — K529 Noninfective gastroenteritis and colitis, unspecified: Principal | ICD-10-CM | POA: Diagnosis present

## 2018-10-29 DIAGNOSIS — F419 Anxiety disorder, unspecified: Secondary | ICD-10-CM | POA: Diagnosis present

## 2018-10-29 DIAGNOSIS — F32A Depression, unspecified: Secondary | ICD-10-CM | POA: Diagnosis present

## 2018-10-29 DIAGNOSIS — E119 Type 2 diabetes mellitus without complications: Secondary | ICD-10-CM | POA: Diagnosis present

## 2018-10-29 DIAGNOSIS — R1110 Vomiting, unspecified: Secondary | ICD-10-CM

## 2018-10-29 DIAGNOSIS — Z720 Tobacco use: Secondary | ICD-10-CM | POA: Diagnosis present

## 2018-10-29 DIAGNOSIS — F329 Major depressive disorder, single episode, unspecified: Secondary | ICD-10-CM | POA: Diagnosis present

## 2018-10-29 DIAGNOSIS — R1084 Generalized abdominal pain: Secondary | ICD-10-CM

## 2018-10-29 DIAGNOSIS — F1010 Alcohol abuse, uncomplicated: Secondary | ICD-10-CM | POA: Diagnosis present

## 2018-10-29 DIAGNOSIS — Z841 Family history of disorders of kidney and ureter: Secondary | ICD-10-CM

## 2018-10-29 DIAGNOSIS — R197 Diarrhea, unspecified: Secondary | ICD-10-CM

## 2018-10-29 DIAGNOSIS — D6959 Other secondary thrombocytopenia: Secondary | ICD-10-CM | POA: Diagnosis present

## 2018-10-29 DIAGNOSIS — F17210 Nicotine dependence, cigarettes, uncomplicated: Secondary | ICD-10-CM | POA: Diagnosis present

## 2018-10-29 DIAGNOSIS — Z96651 Presence of right artificial knee joint: Secondary | ICD-10-CM | POA: Diagnosis present

## 2018-10-29 DIAGNOSIS — R748 Abnormal levels of other serum enzymes: Secondary | ICD-10-CM | POA: Diagnosis present

## 2018-10-29 DIAGNOSIS — G4730 Sleep apnea, unspecified: Secondary | ICD-10-CM | POA: Diagnosis present

## 2018-10-29 DIAGNOSIS — K567 Ileus, unspecified: Secondary | ICD-10-CM | POA: Diagnosis present

## 2018-10-29 DIAGNOSIS — Z20828 Contact with and (suspected) exposure to other viral communicable diseases: Secondary | ICD-10-CM | POA: Diagnosis present

## 2018-10-29 DIAGNOSIS — N179 Acute kidney failure, unspecified: Secondary | ICD-10-CM | POA: Diagnosis present

## 2018-10-29 LAB — CBC WITH DIFFERENTIAL/PLATELET
Abs Immature Granulocytes: 0.01 10*3/uL (ref 0.00–0.07)
Basophils Absolute: 0 10*3/uL (ref 0.0–0.1)
Basophils Relative: 1 %
Eosinophils Absolute: 0.1 10*3/uL (ref 0.0–0.5)
Eosinophils Relative: 1 %
HCT: 43.4 % (ref 39.0–52.0)
Hemoglobin: 15 g/dL (ref 13.0–17.0)
Immature Granulocytes: 0 %
Lymphocytes Relative: 41 %
Lymphs Abs: 2.2 10*3/uL (ref 0.7–4.0)
MCH: 32 pg (ref 26.0–34.0)
MCHC: 34.6 g/dL (ref 30.0–36.0)
MCV: 92.5 fL (ref 80.0–100.0)
Monocytes Absolute: 0.5 10*3/uL (ref 0.1–1.0)
Monocytes Relative: 9 %
Neutro Abs: 2.6 10*3/uL (ref 1.7–7.7)
Neutrophils Relative %: 48 %
Platelets: 136 10*3/uL — ABNORMAL LOW (ref 150–400)
RBC: 4.69 MIL/uL (ref 4.22–5.81)
RDW: 13.5 % (ref 11.5–15.5)
WBC: 5.4 10*3/uL (ref 4.0–10.5)
nRBC: 0 % (ref 0.0–0.2)

## 2018-10-29 LAB — COMPREHENSIVE METABOLIC PANEL
ALT: 52 U/L — ABNORMAL HIGH (ref 0–44)
AST: 85 U/L — ABNORMAL HIGH (ref 15–41)
Albumin: 3.1 g/dL — ABNORMAL LOW (ref 3.5–5.0)
Alkaline Phosphatase: 167 U/L — ABNORMAL HIGH (ref 38–126)
Anion gap: 7 (ref 5–15)
BUN: 20 mg/dL (ref 6–20)
CO2: 23 mmol/L (ref 22–32)
Calcium: 7.9 mg/dL — ABNORMAL LOW (ref 8.9–10.3)
Chloride: 105 mmol/L (ref 98–111)
Creatinine, Ser: 1.19 mg/dL (ref 0.61–1.24)
GFR calc Af Amer: >60 mL/min (ref >60)
GFR calc non Af Amer: >60 mL/min (ref >60)
Glucose, Bld: 84 mg/dL (ref 70–99)
Potassium: 4.1 mmol/L (ref 3.5–5.1)
Sodium: 135 mmol/L (ref 135–145)
Total Bilirubin: 0.6 mg/dL (ref 0.3–1.2)
Total Protein: 6.3 g/dL — ABNORMAL LOW (ref 6.5–8.1)

## 2018-10-29 LAB — LIPASE, BLOOD: Lipase: 38 U/L (ref 11–51)

## 2018-10-29 MED ORDER — IOHEXOL 300 MG/ML  SOLN
100.0000 mL | Freq: Once | INTRAMUSCULAR | Status: AC | PRN
  Administered 2018-10-29: 100 mL via INTRAVENOUS

## 2018-10-29 MED ORDER — SODIUM CHLORIDE 0.9 % IV BOLUS
1000.0000 mL | Freq: Once | INTRAVENOUS | Status: AC
  Administered 2018-10-29: 1000 mL via INTRAVENOUS

## 2018-10-29 MED ORDER — ONDANSETRON HCL 4 MG/2ML IJ SOLN
4.0000 mg | Freq: Once | INTRAMUSCULAR | Status: AC
  Administered 2018-10-29: 4 mg via INTRAVENOUS
  Filled 2018-10-29: qty 2

## 2018-10-29 MED ORDER — MORPHINE SULFATE (PF) 4 MG/ML IV SOLN
4.0000 mg | Freq: Once | INTRAVENOUS | Status: AC
  Administered 2018-10-29: 22:00:00 4 mg via INTRAVENOUS
  Filled 2018-10-29: qty 1

## 2018-10-29 NOTE — ED Notes (Signed)
Patient transported to CT 

## 2018-10-29 NOTE — H&P (Signed)
History and Physical    Brandon Miller [id number] DOB: 05-05-1965 DOA: 10/29/2018  PCP: Celene Squibb, MD   Patient coming from: Home.  I have personally briefly reviewed patient's old medical records in [place]  Chief Complaint: Nausea vomiting and diarrhea.  HPI: Brandon Miller is a 53 y.o. male with medical history significant of seasonal allergies, chronic anemia, anxiety, depression, rheumatoid arthritis, type 2 diabetes, sleep apnea who is coming to the emergency department due to having nausea, vomiting and diarrhea for about a month.  Per patient, he has usually 8-10 loose stool BMs daily.  He has single daily episode of emesis on some days usually before lunch.  He was given a course of metronidazole and ciprofloxacin without significant results.  He was seen at [facility] yesterday and discharged home with referral to GI.  He has not been able to have an appointment set up with GI.  He constipation, melena or hematochezia.  No dysuria, frequency or hematuria.  Denies fever, chills, sore throat, rhinorrhea, dyspnea, wheezing or hemoptysis.  No chest pain, palpitations, dizziness, diaphoresis, PND, orthopnea or pitting edema of the lower extremities.  No polyuria, polydipsia, polyphagia or blurred vision.  ED Course: She will vital signs temperature 98.5 F, pulse 90, respiration 18, blood pressure 131/86 mmHg and O2 sat 99% on room air.  The patient was given a 1000 mL normal saline bolus, 4 mg of morphine IVP and Zofran 4 mg IVP.  His CBC was normal, except for platelets of 136.  Lipase was 38 units/L.  CMP shows normal renal function and normal electrolytes when calcium is corrected to albumin.  Total protein 6.3 and albumin 3.1 g/dL.  AST 85, ALT 52 and alkaline phosphatase 167 units/L.  Imaging: CT abdomen/pelvis with contrast showed nonspecific focally dilated loop of ileum within the right midabdomen which appears new from the prior exam with a gradual area of  tapering in the distal ileal loops.  These could represent a focal ileus or partial small bowel obstruction.  There was also colonic diverticuli and mild perirectal wall thickening as seen on prior exam which could be due to infectious or inflammatory colitis.  Review of Systems: As per HPI otherwise 10 point review of systems negative.    Past Medical History:  Diagnosis Date   Allergy    Anemia    Anxiety    Arthritis    RA   Complication of anesthesia    hard to wake up-was told he may have sleep apnea-never tested   Depression    Diabetes mellitus without complication ([place])    Sleep apnea    Snores     Past Surgical History:  Procedure Laterality Date   ELBOW SURGERY Right 2010   Dr. Percell Miller   HERNIA REPAIR     KNEE ARTHROSCOPY WITH MEDIAL MENISECTOMY Right 06/11/2013   Procedure: RIGHT KNEE ARTHROSCOPY WITH MEDIAL MENISECTOMY AND CHONDROPLASTY;  Surgeon: Ninetta Lights, MD;  Location: [place];  Service: Orthopedics;  Laterality: Right;  Chondroplasty, loose bodies, medial menisectomy, lysis of adhesions.   KNEE SURGERY Right 2011   right ankle     SHOULDER SURGERY Right 2010   Dr. Percell Miller   TOTAL KNEE ARTHROPLASTY Right 08/25/2014   Procedure: TOTAL KNEE ARTHROPLASTY;  Surgeon: Ninetta Lights, MD;  Location: [place];  Service: Orthopedics;  Laterality: Right;     reports that he has been smoking cigarettes. He has a 12.50 pack-year smoking history. He has never  used smokeless tobacco. He reports current alcohol use. He reports that he does not use drugs.  Allergies  Allergen Reactions   Daypro [Oxaprozin] Other (See Comments)    'tears his stomach up"   Diclofenac Sodium Other (See Comments)    dizzy   Indocin [Indomethacin] Nausea And Vomiting   Zanaflex [Tizanidine Hcl] Swelling   Family History  Problem Relation Age of Onset   Hepatitis Mother    Cirrhosis Mother    Pneumonia Father    Kidney failure Father    Prior to  Admission medications   Medication Sig Start Date End Date Taking? Authorizing Provider  ADDERALL XR 25 MG 24 hr capsule Take 25 mg by mouth daily. 09/13/18   [provider]  calcium-vitamin D 250-100 MG-UNIT tablet Take 1 tablet by mouth 2 (two) times daily. 01/27/18   Ripley Fraise, MD  chlordiazePOXIDE (LIBRIUM) 25 MG capsule 50mg  PO TID x 1D, then 25-50mg  PO BID X 1D, then 25-50mg  PO QD X 1D Patient not taking: Reported on 10/11/2018 08/26/18   Veryl Speak, MD  ciprofloxacin (CIPRO) 500 MG tablet Take 1 tablet (500 mg total) by mouth 2 (two) times daily. Patient not taking: Reported on 10/22/2018 10/11/18   Dalia Heading, PA-C  gabapentin (NEURONTIN) 400 MG capsule Take 1 capsule (400 mg total) by mouth 3 (three) times daily. Patient not taking: Reported on 10/11/2018 08/18/18 08/18/19  Johnn Hai, MD  HYDROcodone-acetaminophen (NORCO/VICODIN) 5-325 MG tablet Take 2 tablets by mouth every 4 (four) hours as needed. 10/22/18   Fredia Sorrow, MD  hydrOXYzine (ATARAX/VISTARIL) 25 MG tablet Take 1 tablet (25 mg total) by mouth every 6 (six) hours. Patient not taking: Reported on 10/11/2018 09/25/18   Kinnie Feil, PA-C  loperamide (IMODIUM A-D) 2 MG tablet Take 1 tablet (2 mg total) by mouth 4 (four) times daily as needed for diarrhea or loose stools. 10/22/18   Fredia Sorrow, MD  LORazepam (ATIVAN) 1 MG tablet Take 1 mg by mouth 2 (two) times daily as needed for anxiety. 10/01/18   [provider]  metroNIDAZOLE (FLAGYL) 500 MG tablet Take 1 tablet (500 mg total) by mouth 2 (two) times daily. Patient not taking: Reported on 10/22/2018 10/11/18   Dalia Heading, PA-C  ondansetron (ZOFRAN ODT) 4 MG disintegrating tablet Take 1 tablet (4 mg total) by mouth every 8 (eight) hours as needed. 10/22/18   Fredia Sorrow, MD  predniSONE (STERAPRED UNI-PAK 21 TAB) 10 MG (21) TBPK tablet Take by mouth daily. Take 6 tabs by mouth daily  for 2 days, then 5 tabs for 2 days, then 4  tabs for 2 days, then 3 tabs for 2 days, 2 tabs for 2 days, then 1 tab by mouth daily for 2 days Patient not taking: Reported on 10/11/2018 09/25/18   Kinnie Feil, PA-C  promethazine (PHENERGAN) 25 MG tablet Take 1 tablet (25 mg total) by mouth every 6 (six) hours as needed for nausea or vomiting. Patient not taking: Reported on 10/22/2018 10/11/18   Dalia Heading, PA-C  thiamine 100 MG tablet Take 1 tablet (100 mg total) by mouth daily. 02/06/18   Orson Eva, MD  traZODone (DESYREL) 150 MG tablet Take 1 tablet (150 mg total) by mouth at bedtime. Patient not taking: Reported on 10/22/2018 08/18/18   Johnn Hai, MD    Physical Exam: Vitals:   10/29/18 1740 10/29/18 1741  BP: 131/86   Pulse: 90   Resp: 18   Temp: 98.5 F (36.9 C)  TempSrc: Oral   SpO2: 99%   Weight:  90.7 kg  Height:  6\' 2"  (1.88 m)    Constitutional: NAD, calm, comfortable Eyes: PERRL, lids and conjunctivae normal ENMT: Mucous membranes are mildly dry. Posterior pharynx clear of any exudate or lesions. Neck: normal, supple, no masses, no thyromegaly Respiratory: clear to auscultation bilaterally, no wheezing, no crackles. Normal respiratory effort. No accessory muscle use.  Cardiovascular: Regular rate and rhythm, no murmurs / rubs / gallops. No extremity edema. 2+ pedal pulses. No carotid bruits.  Abdomen: Nondistended. Bowel sounds positive.  Soft, mild diffuse tenderness, no guarding or rebound, no masses palpated. No hepatosplenomegaly.  Musculoskeletal: no clubbing / cyanosis. Good ROM, no contractures. Normal muscle tone.  Skin: no rashes, lesions, ulcers. No induration Neurologic: CN 2-12 grossly intact. Sensation intact, DTR normal. Strength 5/5 in all 4.  Psychiatric: Normal judgment and insight. Alert and oriented x 3. Normal mood.   Labs on Admission: I have personally reviewed following labs and imaging studies  CBC: Recent Labs  Lab 10/29/18 2200  WBC 5.4  NEUTROABS 2.6  HGB 15.0  HCT  43.4  MCV 92.5  PLT [id number]   Basic Metabolic Panel: Recent Labs  Lab 10/29/18 2200  NA 135  K 4.1  CL 105  CO2 23  GLUCOSE 84  BUN 20  CREATININE 1.19  CALCIUM 7.9*   GFR: Estimated Creatinine Clearance: 84.4 mL/min (by C-G formula based on SCr of 1.19 mg/dL). Liver Function Tests: Recent Labs  Lab 10/29/18 2200  AST 85*  ALT 52*  ALKPHOS 167*  BILITOT 0.6  PROT 6.3*  ALBUMIN 3.1*   Recent Labs  Lab 10/29/18 2200  LIPASE 38   No results for input(s): AMMONIA in the last 168 hours. Coagulation Profile: No results for input(s): INR, PROTIME in the last 168 hours. Cardiac Enzymes: No results for input(s): CKTOTAL, CKMB, CKMBINDEX, TROPONINI in the last 168 hours. BNP (last 3 results) No results for input(s): PROBNP in the last 8760 hours. HbA1C: No results for input(s): HGBA1C in the last 72 hours. CBG: No results for input(s): GLUCAP in the last 168 hours. Lipid Profile: No results for input(s): CHOL, HDL, LDLCALC, TRIG, CHOLHDL, LDLDIRECT in the last 72 hours. Thyroid Function Tests: No results for input(s): TSH, T4TOTAL, FREET4, T3FREE, THYROIDAB in the last 72 hours. Anemia Panel: No results for input(s): VITAMINB12, FOLATE, FERRITIN, TIBC, IRON, RETICCTPCT in the last 72 hours. Urine analysis:    Component Value Date/Time   COLORURINE STRAW (A) 10/28/2018 2240   APPEARANCEUR CLEAR 10/28/2018 2240   LABSPEC 1.004 (L) 10/28/2018 2240   PHURINE 6.0 10/28/2018 2240   GLUCOSEU NEGATIVE 10/28/2018 2240   HGBUR NEGATIVE 10/28/2018 2240   BILIRUBINUR NEGATIVE 10/28/2018 2240   KETONESUR NEGATIVE 10/28/2018 2240   PROTEINUR NEGATIVE 10/28/2018 2240   NITRITE NEGATIVE 10/28/2018 [place] 10/28/2018 2240    Radiological Exams on Admission: Ct Abdomen Pelvis W Contrast  Result Date: 10/29/2018 CLINICAL DATA:  Abdominal pain EXAM: CT ABDOMEN AND PELVIS WITH CONTRAST TECHNIQUE: Multidetector CT imaging of the abdomen and pelvis was  performed using the standard protocol following bolus administration of intravenous contrast. CONTRAST:  18mL OMNIPAQUE IOHEXOL 300 MG/ML  SOLN COMPARISON:  Most recent prior October 22, 2018 FINDINGS: Lower chest: The visualized heart size within normal limits. No pericardial fluid/thickening. No hiatal hernia. The visualized portions of the lungs are clear. Hepatobiliary: The liver is normal in density without focal abnormality.The main portal vein is patent. No evidence  of calcified gallstones, gallbladder wall thickening or biliary dilatation. Pancreas: Unremarkable. No pancreatic ductal dilatation or surrounding inflammatory changes. Spleen: Normal in size without focal abnormality. Adrenals/Urinary Tract: Both adrenal glands appear normal. The kidneys and collecting system appear normal without evidence of urinary tract calculus or hydronephrosis. Bladder is unremarkable. Stomach/Bowel: The stomach is normal in appearance. There is a focally dilated air and fluid-filled loops of ileum seen within the right mid abdomen measuring up to 3.4 cm. There is a focal area of tapering seen at the left mid abdomen, series 2, image 41 and the distal ileal loops appear to be decompressed. Again noted is a moderate amount of colonic stool. Scattered colonic diverticula are noted within the sigmoid colon without surrounding fat stranding changes. There is a mildly dilated air and stool-filled rectum. There does appear to be diffuse posterior rectal wall thickening as on the prior exam.The appendix is normal. Vascular/Lymphatic: There are no enlarged mesenteric, retroperitoneal, or pelvic lymph nodes. Scattered aortic atherosclerotic calcifications are seen without aneurysmal dilatation. Reproductive: The prostate is unremarkable. Other: No evidence of abdominal wall mass or hernia. Musculoskeletal: No acute or significant osseous findings. IMPRESSION: 1. Nonspecific focally dilated loop of ileum within the right mid  abdomen which appears new from the prior exam with a gradual area of tapering in the distal ileal loops. This could represent a focal ileus or partial small bowel obstruction. 2. Colonic diverticula. 3. Mild perirectal wall thickening as seen on the prior exam which is non-specific could be due to infectious or inflammatory colitis. Electronically Signed   By: Prudencio Pair M.D.   On: 10/29/2018 23:18    EKG: Independently reviewed.   Assessment/Plan Principal Problem:   Ileus, unspecified ([place]) Observation/MedSurg. Keep n.p.o. Continue IV fluids. Analgesics as needed. Antiemetics as needed. Protonix 40 mg IVP every 24 hours. Consult general surgery. Consult gastroenterology.  Active Problems:   Diabetes mellitus, type 2 ([facility]) Currently n.p.o. CBG monitoring before meals and at bedtime.    Depression Continue trazodone and anxiolytics once med rec performed.    Tobacco abuse Nicotine replacement therapy offered. Staff to provide tobacco cessation information.   DVT prophylaxis: SCDs. Code Status: Full code. Family Communication: Disposition Plan: Observation for IV hydration, bowel rest and general surgery evaluation in a.m. Consults called: Routine general surgery consult. Admission status: Observation/MedSurg.   Reubin Milan MD [facility]  If 7PM-7AM, please contact night-coverage [email or web address]  10/29/2018, 11:57 PM   This document was prepared using Dragon voice recognition software and may contain some unintended transcription errors.

## 2018-10-29 NOTE — ED Triage Notes (Signed)
All over abd pain, N/V/D for over a month.  Has been seen in ED and d/c and told to follow up with GI.  Pt has been unable to make appt with GI Dr

## 2018-10-29 NOTE — ED Provider Notes (Signed)
[facility] EMERGENCY DEPARTMENT Provider Note   CSN: [id number] Arrival date & time: 10/29/18  1728     History   Chief Complaint Chief Complaint  Patient presents with  . Abdominal Pain    HPI Brandon Miller is a 53 y.o. male.     HPI Patient with several weeks of lower abdominal pain, nausea, vomiting and diarrhea.  No blood in the stool or vomit.  No fever or chills.  Patient been seen several times for this.  Has had CT scan with possible colitis.  Had course of Flagyl and Cipro without improvement.  Referred to gastroenterology but patient has not followed up.  Patient does have a history of alcohol abuse but states he is only been drinking a few beers a day. Past Medical History:  Diagnosis Date  . Allergy   . Anemia   . Anxiety   . Arthritis    RA  . Complication of anesthesia    hard to wake up-was told he may have sleep apnea-never tested  . Depression   . Diabetes mellitus without complication ([place])   . Sleep apnea   . Snores     Patient Active Problem List   Diagnosis Date Noted  . Severe recurrent major depression without psychotic features ([place]) 08/15/2018  . Alcohol withdrawal seizure with perceptual disturbance ([place])   . Alcohol abuse with unspecified alcohol-induced disorder ([place]) 02/05/2018  . Transaminasemia 02/05/2018  . Tobacco abuse 02/05/2018  . Depression   . Alcohol withdrawal seizure without complication ([place]) [id number]  . DJD (degenerative joint disease) of knee 08/25/2014  . Diabetes mellitus, type 2 ([place]) 10/31/2011  . Hyperglycemia 10/31/2011  . PNA (pneumonia) 10/30/2011  . LEG PAIN, RIGHT 01/20/2010    Past Surgical History:  Procedure Laterality Date  . ELBOW SURGERY Right 2010   Dr. Percell Miller  . HERNIA REPAIR    . KNEE ARTHROSCOPY WITH MEDIAL MENISECTOMY Right 06/11/2013   Procedure: RIGHT KNEE ARTHROSCOPY WITH MEDIAL MENISECTOMY AND CHONDROPLASTY;  Surgeon: Ninetta Lights, MD;  Location: [place];  Service:  Orthopedics;  Laterality: Right;  Chondroplasty, loose bodies, medial menisectomy, lysis of adhesions.  Marland Kitchen KNEE SURGERY Right 2011  . right ankle    . SHOULDER SURGERY Right 2010   Dr. Percell Miller  . TOTAL KNEE ARTHROPLASTY Right 08/25/2014   Procedure: TOTAL KNEE ARTHROPLASTY;  Surgeon: Ninetta Lights, MD;  Location: [place];  Service: Orthopedics;  Laterality: Right;        Home Medications    Prior to Admission medications   Medication Sig Start Date End Date Taking? Authorizing Provider  ADDERALL XR 25 MG 24 hr capsule Take 25 mg by mouth daily. 09/13/18   [provider]  calcium-vitamin D 250-100 MG-UNIT tablet Take 1 tablet by mouth 2 (two) times daily. 01/27/18   Ripley Fraise, MD  chlordiazePOXIDE (LIBRIUM) 25 MG capsule 50mg  PO TID x 1D, then 25-50mg  PO BID X 1D, then 25-50mg  PO QD X 1D Patient not taking: Reported on 10/11/2018 08/26/18   Veryl Speak, MD  ciprofloxacin (CIPRO) 500 MG tablet Take 1 tablet (500 mg total) by mouth 2 (two) times daily. Patient not taking: Reported on 10/22/2018 10/11/18   Dalia Heading, PA-C  gabapentin (NEURONTIN) 400 MG capsule Take 1 capsule (400 mg total) by mouth 3 (three) times daily. Patient not taking: Reported on 10/11/2018 08/18/18 08/18/19  Johnn Hai, MD  HYDROcodone-acetaminophen (NORCO/VICODIN) 5-325 MG tablet Take 2 tablets by mouth every 4 (four) hours as needed.  10/22/18   Fredia Sorrow, MD  hydrOXYzine (ATARAX/VISTARIL) 25 MG tablet Take 1 tablet (25 mg total) by mouth every 6 (six) hours. Patient not taking: Reported on 10/11/2018 09/25/18   Kinnie Feil, PA-C  loperamide (IMODIUM A-D) 2 MG tablet Take 1 tablet (2 mg total) by mouth 4 (four) times daily as needed for diarrhea or loose stools. 10/22/18   Fredia Sorrow, MD  LORazepam (ATIVAN) 1 MG tablet Take 1 mg by mouth 2 (two) times daily as needed for anxiety. 10/01/18   [provider]  metroNIDAZOLE (FLAGYL) 500 MG tablet Take 1 tablet (500 mg total) by  mouth 2 (two) times daily. Patient not taking: Reported on 10/22/2018 10/11/18   Dalia Heading, PA-C  ondansetron (ZOFRAN ODT) 4 MG disintegrating tablet Take 1 tablet (4 mg total) by mouth every 8 (eight) hours as needed. 10/22/18   Fredia Sorrow, MD  predniSONE (STERAPRED UNI-PAK 21 TAB) 10 MG (21) TBPK tablet Take by mouth daily. Take 6 tabs by mouth daily  for 2 days, then 5 tabs for 2 days, then 4 tabs for 2 days, then 3 tabs for 2 days, 2 tabs for 2 days, then 1 tab by mouth daily for 2 days Patient not taking: Reported on 10/11/2018 09/25/18   Kinnie Feil, PA-C  promethazine (PHENERGAN) 25 MG tablet Take 1 tablet (25 mg total) by mouth every 6 (six) hours as needed for nausea or vomiting. Patient not taking: Reported on 10/22/2018 10/11/18   Dalia Heading, PA-C  thiamine 100 MG tablet Take 1 tablet (100 mg total) by mouth daily. 02/06/18   Orson Eva, MD  traZODone (DESYREL) 150 MG tablet Take 1 tablet (150 mg total) by mouth at bedtime. Patient not taking: Reported on 10/22/2018 08/18/18   Johnn Hai, MD    Family History No family history on file.  Social History Social History   Tobacco Use  . Smoking status: Current Every Day Smoker    Packs/day: 0.50    Years: 25.00    Pack years: 12.50    Types: Cigarettes  . Smokeless tobacco: Never Used  Substance Use Topics  . Alcohol use: Yes    Comment: 2-3x a week. Last drink: yesterday  . Drug use: No     Allergies   Daypro [oxaprozin], Diclofenac sodium, Indocin [indomethacin], and Zanaflex [tizanidine hcl]   Review of Systems Review of Systems  Constitutional: Negative for chills and fever.  HENT: Negative for sore throat and trouble swallowing.   Respiratory: Negative for cough and shortness of breath.   Cardiovascular: Negative for chest pain.  Gastrointestinal: Positive for abdominal pain, diarrhea, nausea and vomiting. Negative for blood in stool and constipation.  Genitourinary: Negative for dysuria,  flank pain and frequency.  Musculoskeletal: Negative for back pain, myalgias and neck pain.  Skin: Negative for rash and wound.  Neurological: Negative for dizziness, weakness, light-headedness, numbness and headaches.  All other systems reviewed and are negative.    Physical Exam Updated Vital Signs BP 131/86 (BP Location: Right Arm)   Pulse 90   Temp 98.5 F (36.9 C) (Oral)   Resp 18   Ht 6\' 2"  (1.88 m)   Wt 90.7 kg   SpO2 99%   BMI 25.68 kg/m   Physical Exam Vitals signs and nursing note reviewed.  Constitutional:      General: He is not in acute distress.    Appearance: Normal appearance. He is well-developed. He is not ill-appearing.  HENT:     Head:  Normocephalic and atraumatic.  Eyes:     Pupils: Pupils are equal, round, and reactive to light.  Neck:     Musculoskeletal: Normal range of motion and neck supple.  Cardiovascular:     Rate and Rhythm: Normal rate and regular rhythm.  Pulmonary:     Effort: Pulmonary effort is normal.     Breath sounds: Normal breath sounds.  Abdominal:     General: Bowel sounds are normal.     Palpations: Abdomen is soft.     Tenderness: There is abdominal tenderness. There is no right CVA tenderness, guarding or rebound.     Comments: Mild diffuse abdominal tenderness worse in the lower quadrants.  No rebound or guarding.  Musculoskeletal: Normal range of motion.        General: No swelling, tenderness, deformity or signs of injury.     Right lower leg: No edema.     Left lower leg: No edema.  Skin:    General: Skin is warm and dry.     Findings: No erythema or rash.  Neurological:     General: No focal deficit present.     Mental Status: He is alert and oriented to person, place, and time.  Psychiatric:        Behavior: Behavior normal.     Comments: Anxious appearing      ED Treatments / Results  Labs (all labs ordered are listed, but only abnormal results are displayed) Labs Reviewed  CBC WITH  DIFFERENTIAL/PLATELET - Abnormal; Notable for the following components:      Result Value   Platelets 136 (*)    All other components within normal limits  COMPREHENSIVE METABOLIC PANEL - Abnormal; Notable for the following components:   Calcium 7.9 (*)    Total Protein 6.3 (*)    Albumin 3.1 (*)    AST 85 (*)    ALT 52 (*)    Alkaline Phosphatase 167 (*)    All other components within normal limits  LIPASE, BLOOD    EKG None  Radiology Ct Abdomen Pelvis W Contrast  Result Date: 10/29/2018 CLINICAL DATA:  Abdominal pain EXAM: CT ABDOMEN AND PELVIS WITH CONTRAST TECHNIQUE: Multidetector CT imaging of the abdomen and pelvis was performed using the standard protocol following bolus administration of intravenous contrast. CONTRAST:  111mL OMNIPAQUE IOHEXOL 300 MG/ML  SOLN COMPARISON:  Most recent prior October 22, 2018 FINDINGS: Lower chest: The visualized heart size within normal limits. No pericardial fluid/thickening. No hiatal hernia. The visualized portions of the lungs are clear. Hepatobiliary: The liver is normal in density without focal abnormality.The main portal vein is patent. No evidence of calcified gallstones, gallbladder wall thickening or biliary dilatation. Pancreas: Unremarkable. No pancreatic ductal dilatation or surrounding inflammatory changes. Spleen: Normal in size without focal abnormality. Adrenals/Urinary Tract: Both adrenal glands appear normal. The kidneys and collecting system appear normal without evidence of urinary tract calculus or hydronephrosis. Bladder is unremarkable. Stomach/Bowel: The stomach is normal in appearance. There is a focally dilated air and fluid-filled loops of ileum seen within the right mid abdomen measuring up to 3.4 cm. There is a focal area of tapering seen at the left mid abdomen, series 2, image 41 and the distal ileal loops appear to be decompressed. Again noted is a moderate amount of colonic stool. Scattered colonic diverticula are noted  within the sigmoid colon without surrounding fat stranding changes. There is a mildly dilated air and stool-filled rectum. There does appear to be diffuse posterior rectal wall  thickening as on the prior exam.The appendix is normal. Vascular/Lymphatic: There are no enlarged mesenteric, retroperitoneal, or pelvic lymph nodes. Scattered aortic atherosclerotic calcifications are seen without aneurysmal dilatation. Reproductive: The prostate is unremarkable. Other: No evidence of abdominal wall mass or hernia. Musculoskeletal: No acute or significant osseous findings. IMPRESSION: 1. Nonspecific focally dilated loop of ileum within the right mid abdomen which appears new from the prior exam with a gradual area of tapering in the distal ileal loops. This could represent a focal ileus or partial small bowel obstruction. 2. Colonic diverticula. 3. Mild perirectal wall thickening as seen on the prior exam which is non-specific could be due to infectious or inflammatory colitis. Electronically Signed   By: Prudencio Pair M.D.   On: 10/29/2018 23:18    Procedures Procedures (including critical care time)  Medications Ordered in ED Medications  sodium chloride 0.9 % bolus 1,000 mL (0 mLs Intravenous Stopped 10/29/18 2339)  morphine 4 MG/ML injection 4 mg (4 mg Intravenous Given 10/29/18 2207)  ondansetron (ZOFRAN) injection 4 mg (4 mg Intravenous Given 10/29/18 2208)  iohexol (OMNIPAQUE) 300 MG/ML solution 100 mL (100 mLs Intravenous Contrast Given 10/29/18 2250)     Initial Impression / Assessment and Plan / ED Course  I have reviewed the triage vital signs and the nursing notes.  Pertinent labs & imaging results that were available during my care of the patient were reviewed by me and considered in my medical decision making (see chart for details).       Questionable partial small bowel obstruction on CT.  Laboratory work-up is reassuring.  Will discuss with hospitalist regarding admission.   Final  Clinical Impressions(s) / ED Diagnoses   Final diagnoses:  Generalized abdominal pain    ED Discharge Orders    None       Julianne Rice, MD 10/29/18 2345

## 2018-10-30 ENCOUNTER — Encounter (HOSPITAL_COMMUNITY): Payer: Self-pay | Admitting: Internal Medicine

## 2018-10-30 DIAGNOSIS — R197 Diarrhea, unspecified: Secondary | ICD-10-CM

## 2018-10-30 DIAGNOSIS — R1084 Generalized abdominal pain: Secondary | ICD-10-CM

## 2018-10-30 DIAGNOSIS — F1010 Alcohol abuse, uncomplicated: Secondary | ICD-10-CM

## 2018-10-30 DIAGNOSIS — G4730 Sleep apnea, unspecified: Secondary | ICD-10-CM | POA: Diagnosis present

## 2018-10-30 DIAGNOSIS — R1110 Vomiting, unspecified: Secondary | ICD-10-CM

## 2018-10-30 LAB — COMPREHENSIVE METABOLIC PANEL
ALT: 42 U/L (ref 0–44)
AST: 62 U/L — ABNORMAL HIGH (ref 15–41)
Albumin: 2.8 g/dL — ABNORMAL LOW (ref 3.5–5.0)
Alkaline Phosphatase: 110 U/L (ref 38–126)
Anion gap: 6 (ref 5–15)
BUN: 19 mg/dL (ref 6–20)
CO2: 25 mmol/L (ref 22–32)
Calcium: 7.4 mg/dL — ABNORMAL LOW (ref 8.9–10.3)
Chloride: 106 mmol/L (ref 98–111)
Creatinine, Ser: 1.28 mg/dL — ABNORMAL HIGH (ref 0.61–1.24)
GFR calc Af Amer: >60 mL/min (ref >60)
GFR calc non Af Amer: >60 mL/min (ref >60)
Glucose, Bld: 75 mg/dL (ref 70–99)
Potassium: 4.1 mmol/L (ref 3.5–5.1)
Sodium: 137 mmol/L (ref 135–145)
Total Bilirubin: 1.6 mg/dL — ABNORMAL HIGH (ref 0.3–1.2)
Total Protein: 5.5 g/dL — ABNORMAL LOW (ref 6.5–8.1)

## 2018-10-30 LAB — CBC WITH DIFFERENTIAL/PLATELET
Abs Immature Granulocytes: 0.03 10*3/uL (ref 0.00–0.07)
Basophils Absolute: 0 10*3/uL (ref 0.0–0.1)
Basophils Relative: 1 %
Eosinophils Absolute: 0.1 10*3/uL (ref 0.0–0.5)
Eosinophils Relative: 2 %
HCT: 39.7 % (ref 39.0–52.0)
Hemoglobin: 13.2 g/dL (ref 13.0–17.0)
Immature Granulocytes: 1 %
Lymphocytes Relative: 43 %
Lymphs Abs: 2.5 10*3/uL (ref 0.7–4.0)
MCH: 31.7 pg (ref 26.0–34.0)
MCHC: 33.2 g/dL (ref 30.0–36.0)
MCV: 95.2 fL (ref 80.0–100.0)
Monocytes Absolute: 0.6 10*3/uL (ref 0.1–1.0)
Monocytes Relative: 10 %
Neutro Abs: 2.6 10*3/uL (ref 1.7–7.7)
Neutrophils Relative %: 43 %
Platelets: 107 10*3/uL — ABNORMAL LOW (ref 150–400)
RBC: 4.17 MIL/uL — ABNORMAL LOW (ref 4.22–5.81)
RDW: 13.9 % (ref 11.5–15.5)
WBC: 5.8 10*3/uL (ref 4.0–10.5)
nRBC: 0 % (ref 0.0–0.2)

## 2018-10-30 LAB — SARS CORONAVIRUS 2 BY RT PCR (HOSPITAL ORDER, PERFORMED IN ~~LOC~~ HOSPITAL LAB): SARS Coronavirus 2: NEGATIVE

## 2018-10-30 MED ORDER — BUPROPION HCL ER (XL) 150 MG PO TB24
150.0000 mg | ORAL_TABLET | Freq: Every day | ORAL | Status: DC
  Administered 2018-10-30 – 2018-11-02 (×4): 150 mg via ORAL
  Filled 2018-10-30 (×4): qty 1

## 2018-10-30 MED ORDER — HYDROMORPHONE HCL 1 MG/ML IJ SOLN
0.5000 mg | INTRAMUSCULAR | Status: DC | PRN
  Administered 2018-10-30 – 2018-11-02 (×19): 0.5 mg via INTRAVENOUS
  Filled 2018-10-30 (×19): qty 0.5

## 2018-10-30 MED ORDER — ACETAMINOPHEN 650 MG RE SUPP: 650.0000 mg | Freq: Four times a day (QID) | RECTAL | Status: DC | PRN

## 2018-10-30 MED ORDER — TRAZODONE HCL 50 MG PO TABS
150.0000 mg | ORAL_TABLET | Freq: Every evening | ORAL | Status: DC | PRN
  Administered 2018-10-30 – 2018-11-01 (×3): 150 mg via ORAL
  Filled 2018-10-30 (×3): qty 3

## 2018-10-30 MED ORDER — POTASSIUM CHLORIDE IN NACL 20-0.9 MEQ/L-% IV SOLN
INTRAVENOUS | Status: DC
  Administered 2018-10-30 – 2018-11-01 (×5): via INTRAVENOUS

## 2018-10-30 MED ORDER — ACETAMINOPHEN 325 MG PO TABS: 650.0000 mg | ORAL_TABLET | Freq: Four times a day (QID) | ORAL | Status: DC | PRN

## 2018-10-30 MED ORDER — PROCHLORPERAZINE EDISYLATE 10 MG/2ML IJ SOLN
5.0000 mg | INTRAMUSCULAR | Status: DC | PRN
  Administered 2018-10-30: 03:00:00 5 mg via INTRAVENOUS
  Filled 2018-10-30: qty 2

## 2018-10-30 MED ORDER — VITAMIN B-1 100 MG PO TABS
100.0000 mg | ORAL_TABLET | Freq: Every day | ORAL | Status: DC
  Administered 2018-10-30 – 2018-11-02 (×4): 100 mg via ORAL
  Filled 2018-10-30: qty 1
  Filled 2018-10-30: qty 2
  Filled 2018-10-30 (×3): qty 1
  Filled 2018-10-30: qty 2

## 2018-10-30 MED ORDER — ENOXAPARIN SODIUM 40 MG/0.4ML ~~LOC~~ SOLN
40.0000 mg | SUBCUTANEOUS | Status: DC
  Administered 2018-10-30 – 2018-11-01 (×3): 40 mg via SUBCUTANEOUS
  Filled 2018-10-30 (×3): qty 0.4

## 2018-10-30 MED ORDER — ESCITALOPRAM OXALATE 10 MG PO TABS
10.0000 mg | ORAL_TABLET | Freq: Every day | ORAL | Status: DC
  Administered 2018-10-30 – 2018-11-02 (×4): 10 mg via ORAL
  Filled 2018-10-30 (×4): qty 1

## 2018-10-30 MED ORDER — HYDROMORPHONE HCL 1 MG/ML IJ SOLN
1.0000 mg | Freq: Once | INTRAMUSCULAR | Status: AC
  Administered 2018-10-30: 03:00:00 1 mg via INTRAVENOUS
  Filled 2018-10-30: qty 1

## 2018-10-30 MED ORDER — AMPHETAMINE-DEXTROAMPHET ER 25 MG PO CP24: 25.0000 mg | ORAL_CAPSULE | Freq: Every day | ORAL | Status: DC

## 2018-10-30 MED ORDER — LORAZEPAM 1 MG PO TABS
1.0000 mg | ORAL_TABLET | Freq: Two times a day (BID) | ORAL | Status: DC | PRN
  Administered 2018-10-30 – 2018-11-01 (×3): 1 mg via ORAL
  Filled 2018-10-30 (×3): qty 1

## 2018-10-30 MED ORDER — PANTOPRAZOLE SODIUM 40 MG IV SOLR
40.0000 mg | INTRAVENOUS | Status: DC
  Administered 2018-10-30 – 2018-10-31 (×2): 40 mg via INTRAVENOUS
  Filled 2018-10-30 (×2): qty 40

## 2018-10-30 MED ORDER — CALCIUM CARBONATE-VITAMIN D 500-200 MG-UNIT PO TABS
1.0000 | ORAL_TABLET | Freq: Two times a day (BID) | ORAL | Status: DC
  Administered 2018-10-30 – 2018-11-02 (×6): 1 via ORAL
  Filled 2018-10-30 (×6): qty 1

## 2018-10-30 MED ORDER — AMPHETAMINE-DEXTROAMPHETAMINE 10 MG PO TABS
10.0000 mg | ORAL_TABLET | Freq: Two times a day (BID) | ORAL | Status: DC
  Administered 2018-10-30 – 2018-11-02 (×6): 10 mg via ORAL
  Filled 2018-10-30 (×6): qty 1

## 2018-10-30 NOTE — Plan of Care (Signed)

## 2018-10-30 NOTE — Progress Notes (Signed)
PROGRESS NOTE  Brandon Miller  DOB: [birth year]  PCP: Celene Squibb, MD [id number]  DOA: 10/29/2018  LOS: 0 days   Brief narrative: Brandon Miller is a 53 y.o. male with past medical history significant for type II diabetes, sleep apnea, alcohol abuse, anxiety/depression, rheumatoid arthritis. He presented to the ED on 10/7 with several week history of lower abdominal pain associated with vomiting and diarrhea.   He had a recent admission in July to behavioral health for suicidal ideation and alcohol dependence. Reports cutting down on alcohol since that admission. Patient was seen in the ED on September 19 for abdominal pain, vomiting and diarrhea.  Started on a course of Cipro and Flagyl but without improvement.  10/6, patient went to was long ED but left before being seen because of long wait time.  Return to the ED again on 10/7 with persistent lower abdominal pain and diarrhea.    CT scan abdomen 9/19 showed mild circumferential thickening in the distal sigmoid colon and rectum.  No dilated bowel.  Findings suggestive of avascular necrosis of the right femoral head without subcortical collapse. CT scan of abdomen 9/30 showed some progression of circumferential wall thickening of the distal sigmoid and rectum but no mesenteric stranding.  Suggestion of small caliber perihepatic varices raising question of portal hypertension although the liver does not appear obviously cirrhotic.   CT scan of abdomen pelvis from 10/7 showed nonspecific focally dilated loop of ileum within the right midabdomen which appears new from prior study with a gradual area of tapering in the distal ileal loops, mild area rectal wall thickening as seen on prior exam.  In the ED, WBC count 5.4, hemoglobin 15, platelet 136, lipase normal.  Liver enzymes improved to comparison from a week ago.  Creatinine 1.28  He was admitted to hospitalist medicine service for further evaluation and management.  Subjective: Patient  was seen and examined this morning.  Pleasant middle-aged male.  Not in distress not in alcohol tremor.  Seen by GI and general surgery this morning.  Assessment/Plan: Persistent nausea, vomiting, diarrhea, abdominal pain -Ongoing for last 4-6 weeks. -No fever, WBC count normal. -CT scan done 3 times in last 1 month.  No intra-abdominal pathology to explain the severity of his symptoms. -GI consultation obtained. -Vomiting could be due to alcoholic gastritis. -Recommended Protonix.  Stool for C. difficile, GI pathogen panel. -Clear liquid diet for now.  May need colonoscopy/EGD inpatient versus outpatient. -No need of antibiotics at this time.  Completed 2 weeks course of Cipro and Flagyl as an outpatient.  Ileus CT scan 10/7 suggested nonspecific focally dilated loop of ileum which is likely related to persistent diarrhea.  Neurosurgery consult obtained.  No need of surgical intervention at this time.    Diabetes mellitus -Diet controlled.  Currently on Accu-Cheks and SSI.  Depression -Home meds include Adderall, Wellbutrin, Lexapro, Ativan, trazodone -Continue all.  History of alcoholism -He had a recent admission in July to behavioral health for suicidal ideation and alcohol dependence. Reports cutting down on alcohol since that admission. -Not in withdrawal symptoms at this time.  Chronic daily smoker -Smokes around 5 to 10 cigarettes a day.  Does not want nicotine patch.  Counseled to quit.  Mobility: Encourage ambulation DVT prophylaxis:  Lovenox subcu Code Status:   Code Status: Full Code  Family Communication:  Expected Discharge:  Anticipate 1 to 2 days to home  Consultants:  Neurosurgery, GI  Procedures:  None  Antimicrobials: Anti-infectives (From admission, onward)  None      Diet Order            Diet clear liquid Room service appropriate? Yes; Fluid consistency: Thin  Diet effective now              Infusions:  . 0.9 % NaCl with KCl 20 mEq /  L 125 mL/hr at 10/30/18 [id number]    Scheduled Meds: . enoxaparin (LOVENOX) injection  40 mg Subcutaneous Q24H  . pantoprazole (PROTONIX) IV  40 mg Intravenous Q24H    PRN meds: acetaminophen **OR** acetaminophen, HYDROmorphone (DILAUDID) injection, prochlorperazine   Objective: Vitals:   10/30/18 0128 10/30/18 0527  BP: 128/88 113/67  Pulse: (!) 52 62  Resp: 16 16  Temp:  98.3 F (36.8 C)  SpO2:  97%    Intake/Output Summary (Last 24 hours) at 10/30/2018 1253 Last data filed at 10/30/2018 0900 Gross per 24 hour  Intake 1067.9 ml  Output -  Net 1067.9 ml   Filed Weights   10/29/18 1741  Weight: 90.7 kg   Weight change:  Body mass index is 25.68 kg/m.   Physical Exam: General exam: Appears calm and comfortable.  Skin: No rashes, lesions or ulcers. HEENT: Atraumatic, normocephalic, supple neck, no obvious bleeding Lungs: Clear to auscultation bilaterally CVS: Regular rate and rhythm, no murmur GI/Abd soft, nondistended, mild tenderness in lower abdomen, bowel sound present CNS: Alert, awake, oriented x3 Psychiatry: Mood appropriate Extremities: No pedal edema, no calf tenderness  Data Review: I have personally reviewed the laboratory data and studies available.  Recent Labs  Lab 10/29/18 2200 10/30/18 0640  WBC 5.4 5.8  NEUTROABS 2.6 2.6  HGB 15.0 13.2  HCT 43.4 39.7  MCV 92.5 95.2  PLT 136* 107*   Recent Labs  Lab 10/29/18 2200 10/30/18 0640  NA 135 137  K 4.1 4.1  CL 105 106  CO2 23 25  GLUCOSE 84 75  BUN 20 19  CREATININE 1.19 1.28*  CALCIUM 7.9* 7.4*    Terrilee Croak, MD  [facility] 10/30/2018

## 2018-10-30 NOTE — Consult Note (Signed)
Reason for Consult: Ileus versus small bowel obstruction Referring Physician: Dr. Maeola Sarah Brandon Miller is an 53 y.o. male.  HPI: Patient is a 53 year old white male with multiple medical problems who has been having intermittent episodes of nausea, vomiting, and diarrhea over the past 3 to 4 weeks.  He states he has multiple loose bowel movements daily.  He has had multiple emergency room visits.  A CT scan of the abdomen was performed which revealed a single loop of ileum which appeared to be focally dilated as compared to previous CT scans.  This is the patient's third CAT scan in the past month.  GI consultation is pending.  He has never had abdominal surgery.  He currently has minimal abdominal pain.  Past Medical History:  Diagnosis Date  . Allergy   . Anemia   . Anxiety   . Arthritis    RA  . Complication of anesthesia    hard to wake up-was told he may have sleep apnea-never tested  . Depression   . Diabetes mellitus without complication ([place])   . Sleep apnea   . Snores     Past Surgical History:  Procedure Laterality Date  . ELBOW SURGERY Right 2010   Dr. Percell Miller  . HERNIA REPAIR    . KNEE ARTHROSCOPY WITH MEDIAL MENISECTOMY Right 06/11/2013   Procedure: RIGHT KNEE ARTHROSCOPY WITH MEDIAL MENISECTOMY AND CHONDROPLASTY;  Surgeon: Ninetta Lights, MD;  Location: [facility];  Service: Orthopedics;  Laterality: Right;  Chondroplasty, loose bodies, medial menisectomy, lysis of adhesions.  Marland Kitchen KNEE SURGERY Right 2011  . right ankle    . SHOULDER SURGERY Right 2010   Dr. Percell Miller  . TOTAL KNEE ARTHROPLASTY Right 08/25/2014   Procedure: TOTAL KNEE ARTHROPLASTY;  Surgeon: Ninetta Lights, MD;  Location: [facility];  Service: Orthopedics;  Laterality: Right;    Family History  Problem Relation Age of Onset  . Hepatitis Mother   . Cirrhosis Mother   . Pneumonia Father   . Kidney failure Father     Social History:  reports that he has been smoking cigarettes. He has a 12.50  pack-year smoking history. He has never used smokeless tobacco. He reports current alcohol use. He reports that he does not use drugs.  Allergies:  Allergies  Allergen Reactions  . Daypro [Oxaprozin] Other (See Comments)    'tears his stomach up"  . Diclofenac Sodium Other (See Comments)    dizzy  . Indocin [Indomethacin] Nausea And Vomiting  . Zanaflex [Tizanidine Hcl] Swelling    Medications: I have reviewed the patient's current medications.  Results for orders placed or performed during the hospital encounter of 10/29/18 (from the past 48 hour(s))  CBC with Differential/Platelet     Status: Abnormal   Collection Time: 10/29/18 10:00 PM  Result Value Ref Range   WBC 5.4 4.0 - 10.5 K/uL   RBC 4.69 4.22 - 5.81 MIL/uL   Hemoglobin 15.0 13.0 - 17.0 g/dL   HCT 43.4 39.0 - 52.0 %   MCV 92.5 80.0 - 100.0 fL   MCH 32.0 26.0 - 34.0 pg   MCHC 34.6 30.0 - 36.0 g/dL   RDW 13.5 11.5 - 15.5 %   Platelets 136 (L) 150 - 400 K/uL   nRBC 0.0 0.0 - 0.2 %   Neutrophils Relative % 48 %   Neutro Abs 2.6 1.7 - 7.7 K/uL   Lymphocytes Relative 41 %   Lymphs Abs 2.2 0.7 - 4.0 K/uL   Monocytes  Relative 9 %   Monocytes Absolute 0.5 0.1 - 1.0 K/uL   Eosinophils Relative 1 %   Eosinophils Absolute 0.1 0.0 - 0.5 K/uL   Basophils Relative 1 %   Basophils Absolute 0.0 0.0 - 0.1 K/uL   Immature Granulocytes 0 %   Abs Immature Granulocytes 0.01 0.00 - 0.07 K/uL    Comment: Performed at [facility] [address] [place], [place] [address]  Comprehensive metabolic panel     Status: Abnormal   Collection Time: 10/29/18 10:00 PM  Result Value Ref Range   Sodium 135 135 - 145 mmol/L   Potassium 4.1 3.5 - 5.1 mmol/L   Chloride 105 98 - 111 mmol/L   CO2 23 22 - 32 mmol/L   Glucose, Bld 84 70 - 99 mg/dL   BUN 20 6 - 20 mg/dL   Creatinine, Ser 1.19 0.61 - 1.24 mg/dL   Calcium 7.9 (L) 8.9 - 10.3 mg/dL   Total Protein 6.3 (L) 6.5 - 8.1 g/dL   Albumin 3.1 (L) 3.5 - 5.0 g/dL   AST 85 (H) 15 - 41 U/L    ALT 52 (H) 0 - 44 U/L   Alkaline Phosphatase 167 (H) 38 - 126 U/L   Total Bilirubin 0.6 0.3 - 1.2 mg/dL   GFR calc non Af Amer >60 >60 mL/min   GFR calc Af Amer >60 >60 mL/min   Anion gap 7 5 - 15    Comment: Performed at [facility], [address] [place], [place] [address]  Lipase, blood     Status: None   Collection Time: 10/29/18 10:00 PM  Result Value Ref Range   Lipase 38 11 - 51 U/L    Comment: Performed at [facility], [address], [place], [place] [address]    Ct Abdomen Pelvis W Contrast  Result Date: 10/29/2018 CLINICAL DATA:  Abdominal pain EXAM: CT ABDOMEN AND PELVIS WITH CONTRAST TECHNIQUE: Multidetector CT imaging of the abdomen and pelvis was performed using the standard protocol following bolus administration of intravenous contrast. CONTRAST:  192mL OMNIPAQUE IOHEXOL 300 MG/ML  SOLN COMPARISON:  Most recent prior October 22, 2018 FINDINGS: Lower chest: The visualized heart size within normal limits. No pericardial fluid/thickening. No hiatal hernia. The visualized portions of the lungs are clear. Hepatobiliary: The liver is normal in density without focal abnormality.The main portal vein is patent. No evidence of calcified gallstones, gallbladder wall thickening or biliary dilatation. Pancreas: Unremarkable. No pancreatic ductal dilatation or surrounding inflammatory changes. Spleen: Normal in size without focal abnormality. Adrenals/Urinary Tract: Both adrenal glands appear normal. The kidneys and collecting system appear normal without evidence of urinary tract calculus or hydronephrosis. Bladder is unremarkable. Stomach/Bowel: The stomach is normal in appearance. There is a focally dilated air and fluid-filled loops of ileum seen within the right mid abdomen measuring up to 3.4 cm. There is a focal area of tapering seen at the left mid abdomen, series 2, image 41 and the distal ileal loops appear to be decompressed. Again noted is a moderate amount of colonic stool.  Scattered colonic diverticula are noted within the sigmoid colon without surrounding fat stranding changes. There is a mildly dilated air and stool-filled rectum. There does appear to be diffuse posterior rectal wall thickening as on the prior exam.The appendix is normal. Vascular/Lymphatic: There are no enlarged mesenteric, retroperitoneal, or pelvic lymph nodes. Scattered aortic atherosclerotic calcifications are seen without aneurysmal dilatation. Reproductive: The prostate is unremarkable. Other: No evidence of abdominal wall mass or hernia. Musculoskeletal:  No acute or significant osseous findings. IMPRESSION: 1. Nonspecific focally dilated loop of ileum within the right mid abdomen which appears new from the prior exam with a gradual area of tapering in the distal ileal loops. This could represent a focal ileus or partial small bowel obstruction. 2. Colonic diverticula. 3. Mild perirectal wall thickening as seen on the prior exam which is non-specific could be due to infectious or inflammatory colitis. Electronically Signed   By: Prudencio Pair M.D.   On: 10/29/2018 23:18    ROS:  Pertinent items are noted in HPI.  Blood pressure 113/67, pulse 62, temperature 98.3 F (36.8 C), resp. rate 16, height 6\' 2"  (1.88 m), weight 90.7 kg, SpO2 97 %. Physical Exam: Pleasant well-developed well-nourished white male no acute distress Head is normocephalic, atraumatic Lungs clear to auscultation with good breath sounds bilaterally Heart examination reveals a regular rate and rhythm without S3, S4, murmurs Abdomen is soft, flat with active bowel sounds appreciated.  No appreciable hernias are identified.  No rigidity is noted.  Patient is nontender and nondistended.  CT scan images personally reviewed  Assessment/Plan: Impression: Abnormal CT findings with dilated single loop of ileum.  Given his history and lack of abdominal surgery, adhesive disease less likely.  He does not have an acute surgical abdomen  at this time. Plan: Awaiting GI consultation.  Diet may be advanced from my standpoint.  Will follow with you.  Aviva Signs 10/30/2018, 7:38 AM

## 2018-10-30 NOTE — Consult Note (Signed)
Referring Provider: Terrilee Croak, MD Primary Care Physician:  Celene Squibb, MD Primary Gastroenterologist:  Garfield Cornea, MD  Reason for Consultation:  colitis  HPI: Brandon Miller is a 53 y.o. male with past medical history significant for diabetes, sleep apnea, alcohol abuse, anxiety/depression who presents with several week history of lower abdominal pain associated with vomiting and diarrhea.    Recent admission in July to behavioral health for suicidal ideation and alcohol dependence.  Patient reports chronic history of alcohol use, drinking way too much over the summer.  After admission in July, he returned to drinking but reports drinking much less than before.  Previously drinking 6 to 12 pack of beer daily, now drinking a sixpack per week.  Longest sobriety approximately 5 years.  Seen in the ED first part of September with a rash that developed [place].  Given a course of prednisone.  Rash much improved.  Patient seen in the ED again on September 19 and September 30 with complaints of abdominal pain, vomiting and diarrhea.  Given a course of Cipro and Flagyl on the 19th with no noted improvement.  Presented to the ED on October 6 but left before being seen due to weight.  Presented back yesterday evening with persistent vomiting, diarrhea, abdominal pain.  3 CTs in the last month.  Initial CT on September 19.  Mild circumferential thickening in the distal sigmoid colon and rectum.  No dilated bowel.  Findings suggestive of avascular necrosis of the right femoral head without subcortical collapse.  CT September 30 with some progression of circumferential wall thickening of the distal sigmoid and rectum but no mesenteric stranding.  Suggestion of small caliber perihepatic varices raising question of portal hypertension although the liver does not appear obviously cirrhotic.  CT from yesterday showed nonspecific focally dilated loop of ileum within the right midabdomen  which appears new from prior study with a gradual area of tapering in the distal ileal loops, mild area rectal wall thickening as seen on prior exam.  In the ED, AST and ALT elevated at 85 and 52 respectively.  Last week they were more significantly elevated at 363 and 124 likely related to alcoholic hepatitis.  White blood cell count normal at 5400.  Platelets slightly low at 136,000.  Hemoglobin normal at 15.  Lipase normal.  Acute hepatitis panel negative on September 30.  Today his AST is down to 62, ALT normal at 42, total bilirubin 1.6, creatinine 1.28, hemoglobin 13.2, platelets 107,000.  Patient reports over the last 3 to 4 weeks he has been having anywhere from 5-10 loose to watery stools daily.  Denies any melena.  Stools have been dark however he has been using Pepto-Bismol.  Complains of diffuse abdominal pain, worse in the lower abdomen.  Typically has vomiting after first meal of the day, improves throughout the day.  No hematemesis.  No significant heartburn.  No dysphagia.  Denies any antibiotic use prior to development of diarrhea.  Baseline bowel function typically occasional diarrhea but nothing persisting, no constipation, regular bowel movements daily.  No prior colonoscopy.  Patient evaluated by Dr. Arnoldo Morale for concern of ileus versus small bowel obstruction.  No concerns for significant ileus or obstruction from a surgical standpoint.  Prior to Admission medications   Medication Sig Start Date End Date Taking? Authorizing Provider  ADDERALL XR 25 MG 24 hr capsule Take 25 mg by mouth daily. 09/13/18   [provider]  calcium-vitamin D 250-100 MG-UNIT tablet  Take 1 tablet by mouth 2 (two) times daily. 01/27/18   Ripley Fraise, MD  HYDROcodone-acetaminophen (NORCO/VICODIN) 5-325 MG tablet Take 2 tablets by mouth every 4 (four) hours as needed. 10/22/18   Fredia Sorrow, MD  loperamide (IMODIUM A-D) 2 MG tablet Take 1 tablet (2 mg total) by mouth 4 (four) times daily as  needed for diarrhea or loose stools. 10/22/18   Fredia Sorrow, MD  LORazepam (ATIVAN) 1 MG tablet Take 1 mg by mouth 2 (two) times daily as needed for anxiety. 10/01/18   [provider]  ondansetron (ZOFRAN ODT) 4 MG disintegrating tablet Take 1 tablet (4 mg total) by mouth every 8 (eight) hours as needed. 10/22/18   Fredia Sorrow, MD  promethazine (PHENERGAN) 25 MG tablet Take 1 tablet (25 mg total) by mouth every 6 (six) hours as needed for nausea or vomiting. Patient not taking: Reported on 10/22/2018 10/11/18   Dalia Heading, PA-C  thiamine 100 MG tablet Take 1 tablet (100 mg total) by mouth daily. 02/06/18   Orson Eva, MD            Current Facility-Administered Medications  Medication Dose Route Frequency Provider Last Rate Last Dose  . 0.9 % NaCl with KCl 20 mEq/ L  infusion   Intravenous Continuous Reubin Milan, MD 125 mL/hr at 10/30/18 [phone]    . acetaminophen (TYLENOL) tablet 650 mg  650 mg Oral Q6H PRN Reubin Milan, MD       Or  . acetaminophen (TYLENOL) suppository 650 mg  650 mg Rectal Q6H PRN Reubin Milan, MD      . HYDROmorphone (DILAUDID) injection 0.5 mg  0.5 mg Intravenous Q3H PRN Reubin Milan, MD   0.5 mg at 10/30/18 [id number]  . pantoprazole (PROTONIX) injection 40 mg  40 mg Intravenous Q24H Reubin Milan, MD      . prochlorperazine (COMPAZINE) injection 5 mg  5 mg Intravenous Q4H PRN Reubin Milan, MD   5 mg at 10/30/18 0303    Allergies as of 10/29/2018 - Review Complete 10/29/2018  Allergen Reaction Noted  . Daypro [oxaprozin] Other (See Comments) 08/25/2014  . Diclofenac sodium Other (See Comments) 02/18/2015  . Indocin [indomethacin] Nausea And Vomiting 10/29/2011  . Zanaflex [tizanidine hcl] Swelling 10/29/2011    Past Medical History:  Diagnosis Date  . Allergy   . Anemia   . Anxiety   . Arthritis    RA  . Complication of anesthesia    hard to wake up-was told he may have sleep apnea-never tested  .  Depression   . Diabetes mellitus without complication ([place])   . Sleep apnea   . Snores     Past Surgical History:  Procedure Laterality Date  . ELBOW SURGERY Right 2010   Dr. Percell Miller  . HERNIA REPAIR     Right inguinal  . KNEE ARTHROSCOPY WITH MEDIAL MENISECTOMY Right 06/11/2013   Procedure: RIGHT KNEE ARTHROSCOPY WITH MEDIAL MENISECTOMY AND CHONDROPLASTY;  Surgeon: Ninetta Lights, MD;  Location: [facility];  Service: Orthopedics;  Laterality: Right;  Chondroplasty, loose bodies, medial menisectomy, lysis of adhesions.  Marland Kitchen KNEE SURGERY Right 2011  . right ankle    . SHOULDER SURGERY Right 2010   Dr. Percell Miller  . TOTAL KNEE ARTHROPLASTY Right 08/25/2014   Procedure: TOTAL KNEE ARTHROPLASTY;  Surgeon: Ninetta Lights, MD;  Location: [facility];  Service: Orthopedics;  Laterality: Right;    Family History  Problem Relation Age of Onset  .  Hepatitis Mother   . Cirrhosis Mother        Did not drink  . Pneumonia Father   . Kidney failure Father     Social History   Socioeconomic History  . Marital status: Divorced    Spouse name: Not on file  . Number of children: Not on file  . Years of education: Not on file  . Highest education level: Not on file  Occupational History  . Not on file  Social Needs  . Financial resource strain: Not on file  . Food insecurity    Worry: Not on file    Inability: Not on file  . Transportation needs    Medical: Not on file    Non-medical: Not on file  Tobacco Use  . Smoking status: Current Every Day Smoker    Packs/day: 0.50    Years: 25.00    Pack years: 12.50    Types: Cigarettes  . Smokeless tobacco: Never Used  Substance and Sexual Activity  . Alcohol use: Yes    Comment: 2-3x a week. Last drink: yesterday  . Drug use: No  . Sexual activity: Not Currently  Lifestyle  . Physical activity    Days per week: Not on file    Minutes per session: Not on file  . Stress: Not on file  Relationships  . Social Product manager on phone: Not on file    Gets together: Not on file    Attends religious service: Not on file    Active member of club or organization: Not on file    Attends meetings of clubs or organizations: Not on file    Relationship status: Not on file  . Intimate partner violence    Fear of current or ex partner: Not on file    Emotionally abused: Not on file    Physically abused: Not on file    Forced sexual activity: Not on file  Other Topics Concern  . Not on file  Social History Narrative  . Not on file     ROS:  General: Negative for anorexia, weight loss, fever, chills, fatigue, weakness. Eyes: Negative for vision changes.  ENT: Negative for hoarseness, difficulty swallowing , nasal congestion. CV: Negative for chest pain, angina, palpitations, dyspnea on exertion, peripheral edema.  Respiratory: Negative for dyspnea at rest, dyspnea on exertion, cough, sputum, wheezing.  GI: See history of present illness. GU:  Negative for dysuria, hematuria, urinary incontinence, urinary frequency, nocturnal urination.  MS: Chronic joint pain, some recent low back pain.  Derm: Negative for rash or itching.  Neuro: Negative for weakness, abnormal sensation, seizure, frequent headaches, memory loss, confusion.  Psych: Negative for anxiety, depression, suicidal ideation, hallucinations.  Endo: Negative for unusual weight change.  Heme: Negative for bruising or bleeding. Allergy: Negative for rash or hives.       Physical Examination: Vital signs in last 24 hours: Temp:  [98.3 F (36.8 C)-98.5 F (36.9 C)] 98.3 F (36.8 C) (10/08 0527) Pulse Rate:  [52-90] 62 (10/08 0527) Resp:  [16-19] 16 (10/08 0527) BP: (108-131)/(67-88) 113/67 (10/08 0527) SpO2:  [97 %-99 %] 97 % (10/08 0527) Weight:  [90.7 kg] 90.7 kg (10/07 1741) Last BM Date: 10/29/18  General: Well-nourished, well-developed in no acute distress.  Head: Normocephalic, atraumatic.   Eyes: Conjunctiva pink, no icterus. Mouth:  Oropharyngeal mucosa moist and pink , no lesions erythema or exudate. Neck: Supple without thyromegaly, masses, or lymphadenopathy.  Lungs: Clear to auscultation bilaterally.  Heart: Regular rate and rhythm, no murmurs rubs or gallops.  Abdomen: Bowel sounds are normal, nondistended, no hepatosplenomegaly or masses, no abdominal bruits or hernia, no rebound or guarding.  Mild to moderate diffuse tenderness with palpation. Rectal: Not performed Extremities: No lower extremity edema, clubbing, deformity.  Neuro: Alert and oriented x 4 , grossly normal neurologically.  Skin: Warm and dry, no rash or jaundice.   Psych: Alert and cooperative, normal mood and affect.        Intake/Output from previous day: 10/07 0701 - 10/08 0700 In: 1067.9 [I.V.:67.9; IV Piggyback:1000] Out: -  Intake/Output this shift: No intake/output data recorded.  Lab Results: CBC Recent Labs    10/29/18 2200  WBC 5.4  HGB 15.0  HCT 43.4  MCV 92.5  PLT 136*   BMET Recent Labs    10/29/18 2200  NA 135  K 4.1  CL 105  CO2 23  GLUCOSE 84  BUN 20  CREATININE 1.19  CALCIUM 7.9*   LFT Recent Labs    10/29/18 2200  BILITOT 0.6  ALKPHOS 167*  AST 85*  ALT 52*  PROT 6.3*  ALBUMIN 3.1*    Lipase Recent Labs    10/29/18 2200  LIPASE 38    PT/INR No results for input(s): LABPROT, INR in the last 72 hours.    Imaging Studies: Ct Abdomen Pelvis W Contrast  Result Date: 10/29/2018 CLINICAL DATA:  Abdominal pain EXAM: CT ABDOMEN AND PELVIS WITH CONTRAST TECHNIQUE: Multidetector CT imaging of the abdomen and pelvis was performed using the standard protocol following bolus administration of intravenous contrast. CONTRAST:  113mL OMNIPAQUE IOHEXOL 300 MG/ML  SOLN COMPARISON:  Most recent prior October 22, 2018 FINDINGS: Lower chest: The visualized heart size within normal limits. No pericardial fluid/thickening. No hiatal hernia. The visualized portions of the lungs are clear. Hepatobiliary: The  liver is normal in density without focal abnormality.The main portal vein is patent. No evidence of calcified gallstones, gallbladder wall thickening or biliary dilatation. Pancreas: Unremarkable. No pancreatic ductal dilatation or surrounding inflammatory changes. Spleen: Normal in size without focal abnormality. Adrenals/Urinary Tract: Both adrenal glands appear normal. The kidneys and collecting system appear normal without evidence of urinary tract calculus or hydronephrosis. Bladder is unremarkable. Stomach/Bowel: The stomach is normal in appearance. There is a focally dilated air and fluid-filled loops of ileum seen within the right mid abdomen measuring up to 3.4 cm. There is a focal area of tapering seen at the left mid abdomen, series 2, image 41 and the distal ileal loops appear to be decompressed. Again noted is a moderate amount of colonic stool. Scattered colonic diverticula are noted within the sigmoid colon without surrounding fat stranding changes. There is a mildly dilated air and stool-filled rectum. There does appear to be diffuse posterior rectal wall thickening as on the prior exam.The appendix is normal. Vascular/Lymphatic: There are no enlarged mesenteric, retroperitoneal, or pelvic lymph nodes. Scattered aortic atherosclerotic calcifications are seen without aneurysmal dilatation. Reproductive: The prostate is unremarkable. Other: No evidence of abdominal wall mass or hernia. Musculoskeletal: No acute or significant osseous findings. IMPRESSION: 1. Nonspecific focally dilated loop of ileum within the right mid abdomen which appears new from the prior exam with a gradual area of tapering in the distal ileal loops. This could represent a focal ileus or partial small bowel obstruction. 2. Colonic diverticula. 3. Mild perirectal wall thickening as seen on the prior exam which is non-specific could be due to infectious or inflammatory colitis. Electronically Signed  By: Prudencio Pair M.D.   On:  10/29/2018 23:18   Ct Abdomen Pelvis W Contrast  Result Date: 10/22/2018 CLINICAL DATA:  53 year old male with abdominal pain, nausea vomiting. Possible distal colitis on CT Abdomen and Pelvis earlier this month. No improvement since that time. EXAM: CT ABDOMEN AND PELVIS WITH CONTRAST TECHNIQUE: Multidetector CT imaging of the abdomen and pelvis was performed using the standard protocol following bolus administration of intravenous contrast. CONTRAST:  171mL OMNIPAQUE IOHEXOL 300 MG/ML  SOLN COMPARISON:  CT Abdomen and Pelvis 10/11/2018. FINDINGS: Lower chest: Negative. Hepatobiliary: Liver and gallbladder are within normal limits. Pancreas: Negative. Spleen: Within normal limits. Adrenals/Urinary Tract: Normal adrenal glands. Symmetric bilateral renal enhancement and contrast excretion. Normal proximal ureters. Unremarkable urinary bladder. Stomach/Bowel: Much of the large bowel is decompressed today. The sigmoid colon is redundant. There is gaseous distension of the mid sigmoid in the right lower quadrant and that segment appears normal on series 2, image 62., but distally the large bowel is decompressed including in the rectum, where there is suggestion of circumferential rectal wall thickening up to 11 millimeters on series 2, image 81. This is progressed from the earlier CT, although there remains no convincing mesenteric stranding in the segments. The descending colon is within normal limits. There is fluid in the transverse colon. The hepatic flexure is redundant containing fluid. The right colon is negative aside from fluid content. Normal appendix on series 2, image 56. Negative terminal ileum. Fluid-filled but nondilated small bowel throughout the abdomen. Decompressed stomach. No free air. No free fluid. Vascular/Lymphatic: Aortoiliac calcified atherosclerosis. Major arterial structures remain patent. Portal venous system is patent. There are questionable small perihepatic varices (such as on series  2, image 22). There may be recanalization of the paraumbilical vein, which appears to enter the falciform ligament on image 21. Reproductive: Negative. Other: No pelvic free fluid. Musculoskeletal: No acute osseous abnormality identified. IMPRESSION: 1. Normal appendix. Fluid throughout non-dilated small bowel and proximal colon to the mid transverse. Distal to that the large bowel is decompressed. And although there is no mesenteric stranding, there is continued evidence of circumferential wall thickening in the distal sigmoid and rectum. Differential considerations are generalized gastroenteritis versus colitis. 2. Suggestion of small caliber perihepatic varices, raising the possibility of portal venous hypertension although the liver does not appear obviously cirrhotic. Electronically Signed   By: Genevie Ann M.D.   On: 10/22/2018 23:06   Ct Abdomen Pelvis W Contrast  Result Date: 10/11/2018 CLINICAL DATA:  Acute abdominal pain, rash EXAM: CT ABDOMEN AND PELVIS WITH CONTRAST TECHNIQUE: Multidetector CT imaging of the abdomen and pelvis was performed using the standard protocol following bolus administration of intravenous contrast. CONTRAST:  18mL OMNIPAQUE IOHEXOL 300 MG/ML  SOLN COMPARISON:  None. FINDINGS: Lower chest: No acute abnormality. Hepatobiliary: No focal liver abnormality is seen. No gallstones, gallbladder wall thickening, or biliary dilatation. Pancreas: Unremarkable. No pancreatic ductal dilatation or surrounding inflammatory changes. Spleen: Normal in size without focal abnormality. Adrenals/Urinary Tract: Adrenal glands are unremarkable. Kidneys are normal, without renal calculi, focal lesion, or hydronephrosis. Bladder is unremarkable. Stomach/Bowel: Stomach is within normal limits. Appendix appears normal. There is mild circumferential thickening the distal sigmoid colon and rectum. No pericolonic inflammatory changes. No dilated loops of bowel. Vascular/Lymphatic: No significant vascular  findings are present. No enlarged abdominal or pelvic lymph nodes. Reproductive: Prostate is unremarkable. Other: No abdominal wall hernia or abnormality. No abdominopelvic ascites. Musculoskeletal: Geographic sclerotic and lucent changes of the superior right femoral head suggesting  AVN. Femoral head contour is maintained without collapse. No fractures. IMPRESSION: 1. Mild circumferential thickening of the rectum and distal sigmoid colon suggesting a nonspecific colitis. 2. Findings suggestive of avascular necrosis of the right femoral head without subcortical collapse. Electronically Signed   By: Davina Poke M.D.   On: 10/11/2018 21:20  [4 week]   Impression: Pleasant 53 year old gentleman with history of alcohol abuse, diabetes, sleep apnea presenting with a 3 to 4-week history of abdominal pain associated with vomiting and diarrhea. He has had some mild circumferential thickening of the rectum and distal sigmoid colon noted on all 3 CTs in the past month.  Last CT with focally dilated loop of ileum new from prior CTs concerning for focal ileus or partial obstruction, evaluated by surgery this admission who felt findings were nonspecific.  He will need a colonoscopy, likely as an outpatient after current illness resolves, as CT findings likely does not fully explain his persistent vomiting, diarrhea and abdominal pain.  Differential diagnosis includes infectious colitis, alcohol-related, less likely IBD or malignancy.  Persisting vomiting could be due to alcoholic gastritis.   Mild thrombocytopenia: In the setting of chronic alcohol abuse, questionable small perihepatic varices on September 30 CT raising concern for possible portal venous hypertension although liver does not appear obviously cirrhotic and spleen normal in size.    Plan: 1. Recommend alcohol cessation. 2. Continue protonix. 3. Stool for C. difficile, GI pathogen panel.  If unremarkable and persisting symptoms, may require  inpatient colonoscopy plus or minus upper endoscopy. 4. Clear liquid diet. 5. Supportive measures.  We would like to thank you for the opportunity to participate in the care of [employer]  Laureen Ochs. [facility] [phone] 10/8/202010:51 AM      LOS: 0 days

## 2018-10-30 NOTE — TOC Initial Note (Signed)
Transition of Care [facility]) - Initial/Assessment Note    Patient Details  Name: Brandon Miller MRN: [id number] Date of Birth: 1965/11/01  [facility]) CM/SW Contact:    Trish Mage, LCSW Phone Number: 10/30/2018, 3:48 PM  Clinical Narrative:    Brandon Miller  Is with [place] due to NVD that has been going on for about a month.  Had a GI consult today that appears to recommend eventual colostomy.  He was brought to my attention as he is not working and has no insurance.  Has no computer nor access to internet.  Sister is his only support, and she works 40 hours and cares for a sick husband.  She states his main needs are no insurance, no MD, medications unaffordable.  She confirmed that he is a resident of [place] is not an option.  CSW will check to see what is available in [facility] and link him,  make sure patient has match voucher at d/c.Will follow up with sister to give her update.              Expected Discharge Plan: [place] Barriers to Discharge: Inadequate or no insurance   Patient Goals and CMS Choice Patient states their goals for this hospitalization and ongoing recovery are:: "I need help with my meds and getting a doctor."      Expected Discharge Plan and Services Expected Discharge Plan: [place]   Discharge Planning Services: CM Consult                               HH Arranged: RN          Prior Living Arrangements/Services     Patient language and need for interpreter reviewed:: Yes Do you feel safe going back to the place where you live?: Yes      Need for Family Participation in Patient Care: Yes (Comment) Care giver support system in place?: Yes (comment)   Criminal Activity/Legal Involvement Pertinent to Current Situation/Hospitalization: No - Comment as needed  Activities of Daily Living Home Assistive Devices/Equipment: None ADL Screening (condition at time of  admission) Patient's cognitive ability adequate to safely complete daily activities?: Yes Is the patient deaf or have difficulty hearing?: No Does the patient have difficulty seeing, even when wearing glasses/contacts?: No Does the patient have difficulty concentrating, remembering, or making decisions?: No Patient able to express need for assistance with ADLs?: Yes Does the patient have difficulty dressing or bathing?: No Independently performs ADLs?: Yes (appropriate for developmental age) Does the patient have difficulty walking or climbing stairs?: No Weakness of Legs: None Weakness of Arms/Hands: None  Permission Sought/Granted Permission sought to share information with : Family Supports Permission granted to share information with : Yes, Verbal Permission Granted  Share Information with NAME: Brandon Miller     Permission granted to share info w Relationship: sister  Permission granted to share info w Contact Information: [phone]  Emotional Assessment Appearance:: Appears stated age Attitude/Demeanor/Rapport: Engaged Affect (typically observed): Appropriate Orientation: : Oriented to Self, Oriented to Place, Oriented to  Time, Oriented to Situation Alcohol / Substance Use: Not Applicable Psych Involvement: No (comment)  Admission diagnosis:  Generalized abdominal pain [R10.84] Patient Active Problem List   Diagnosis Date Noted  . Sleep apnea   . Generalized abdominal pain   . Non-intractable vomiting   . Diarrhea   .  ETOH abuse   . Ileus, unspecified ([place]) 10/29/2018  . Severe recurrent major depression without psychotic features ([place]) 08/15/2018  . Alcohol withdrawal seizure with perceptual disturbance ([place])   . Alcohol abuse with unspecified alcohol-induced disorder ([place]) 02/05/2018  . Transaminasemia 02/05/2018  . Tobacco abuse 02/05/2018  . Depression   . Alcohol withdrawal seizure without complication ([place]) [id number]  . DJD (degenerative joint disease) of  knee 08/25/2014  . Diabetes mellitus, type 2 ([place]) 10/31/2011  . Hyperglycemia 10/31/2011  . PNA (pneumonia) 10/30/2011  . LEG PAIN, RIGHT 01/20/2010   PCP:  Celene Squibb, MD Pharmacy:   [id number] - [place], [place] - [address] [place] [address] [place] [address] Phone: [phone] Fax: [phone]     Social Determinants of Health (SDOH) Interventions    Readmission Risk Interventions No flowsheet data found.

## 2018-10-31 DIAGNOSIS — K567 Ileus, unspecified: Secondary | ICD-10-CM

## 2018-10-31 DIAGNOSIS — R112 Nausea with vomiting, unspecified: Secondary | ICD-10-CM

## 2018-10-31 DIAGNOSIS — R197 Diarrhea, unspecified: Secondary | ICD-10-CM

## 2018-10-31 DIAGNOSIS — K529 Noninfective gastroenteritis and colitis, unspecified: Secondary | ICD-10-CM | POA: Diagnosis present

## 2018-10-31 DIAGNOSIS — R1084 Generalized abdominal pain: Secondary | ICD-10-CM

## 2018-10-31 DIAGNOSIS — F1010 Alcohol abuse, uncomplicated: Secondary | ICD-10-CM

## 2018-10-31 MED ORDER — PANTOPRAZOLE SODIUM 40 MG PO TBEC
40.0000 mg | DELAYED_RELEASE_TABLET | Freq: Two times a day (BID) | ORAL | Status: DC
  Administered 2018-10-31 – 2018-11-02 (×4): 40 mg via ORAL
  Filled 2018-10-31 (×4): qty 1

## 2018-10-31 NOTE — TOC Progression Note (Signed)
Transition of Care [facility]) - Progression Note    Patient Details  Name: Brandon Miller MRN: [id number] Date of Birth: December 09, 1965  Transition of Care [facility]) CM/SW Contact  Shade Flood, LCSW Phone Number: 10/31/2018, 3:58 PM  Clinical Narrative:     Gaynelle Arabian from [place] to refer for [facility] RN. Per Tim, he is going to have to try and find another [facility] agency to take it as they do not have staffing for RN cases. TOC will follow up but pt may not be able to go home with [facility].  Expected Discharge Plan: [place] Barriers to Discharge: Inadequate or no insurance  Expected Discharge Plan and Services Expected Discharge Plan: [place]   Discharge Planning Services: CM Consult                               HH Arranged: RN           Social Determinants of Health (SDOH) Interventions    Readmission Risk Interventions No flowsheet data found.

## 2018-10-31 NOTE — TOC Progression Note (Signed)
Transition of Care [facility]) - Progression Note    Patient Details  Name: Brandon Miller MRN: [id number] Date of Birth: 03/24/1965  Transition of Care [facility]) CM/SW [place], [place] Phone Number: 10/31/2018, 3:20 PM  Clinical Narrative:   Provided patient with [facility] voucher for meds.  As well as financial aid paperwork for him to fill out for continuing services.  Asked Dr to call his sister as she had numerous medical questions I was unable to handle.    Expected Discharge Plan: [place] Barriers to Discharge: Inadequate or no insurance  Expected Discharge Plan and Services Expected Discharge Plan: [place]   Discharge Planning Services: CM Consult                               HH Arranged: RN           Social Determinants of Health (SDOH) Interventions    Readmission Risk Interventions No flowsheet data found.

## 2018-10-31 NOTE — Progress Notes (Signed)
PROGRESS NOTE  Brandon Miller  DOB: [birth year]  PCP: Celene Squibb, MD [id number]  DOA: 10/29/2018  LOS: 0 days   Brief narrative: Brandon Miller is a 53 y.o. male with past medical history significant for type II diabetes, sleep apnea, alcohol abuse, anxiety/depression, rheumatoid arthritis. He presented to the ED on 10/7 with several week history of lower abdominal pain associated with vomiting and diarrhea.   He had a recent admission in July to behavioral health for suicidal ideation and alcohol dependence. Reports cutting down on alcohol since that admission. Patient was seen in the ED on September 19 for abdominal pain, vomiting and diarrhea.  Started on a course of Cipro and Flagyl but without improvement.  10/6, patient went to was long ED but left before being seen because of long wait time.  Return to the ED again on 10/7 with persistent lower abdominal pain and diarrhea.    CT scan abdomen 9/19 showed mild circumferential thickening in the distal sigmoid colon and rectum.  No dilated bowel.  Findings suggestive of avascular necrosis of the right femoral head without subcortical collapse. CT scan of abdomen 9/30 showed some progression of circumferential wall thickening of the distal sigmoid and rectum but no mesenteric stranding.  Suggestion of small caliber perihepatic varices raising question of portal hypertension although the liver does not appear obviously cirrhotic.   CT scan of abdomen pelvis from 10/7 showed nonspecific focally dilated loop of ileum within the right midabdomen which appears new from prior study with a gradual area of tapering in the distal ileal loops, mild area rectal wall thickening as seen on prior exam.  In the ED, WBC count 5.4, hemoglobin 15, platelet 136, lipase normal.  Liver enzymes improved to comparison from a week ago.  Creatinine 1.28  He was admitted to hospitalist medicine service for further evaluation and management.  Subjective: Patient  was seen and examined this morning.  Pleasant middle-aged male.  Not in distress not in alcohol tremor.  Seen by GI and general surgery yesterday. Patient did not have any diarrhea last night.  He is only on clear liquid diet. Continues to have mild abdominal pain.  Assessment/Plan: Persistent gastroenteritis  -Presented with nausea, vomiting, diarrhea, abdominal pain ongoing for last 4-6 weeks. -No fever, WBC count normal. -CT scan done 3 times in last 1 month.  No intra-abdominal pathology to explain the severity of his symptoms. -GI consultation obtained. -Vomiting could be due to alcoholic gastritis. -Recommended Protonix.  Stool sample sent for C. difficile, GI pathogen panel. -Clear liquid diet for now.  Defer to GI for advancement.  May need colonoscopy/EGD inpatient versus outpatient. -No need of antibiotics at this time.  Completed 2 weeks course of Cipro and Flagyl as an outpatient.  AKI -Creatinine normal at baseline, trending up, 1.28 today. -Currently on normal saline with 20 of potassium rider at 125/h.  Continue the same.  Ileus CT scan 10/7 suggested nonspecific focally dilated loop of ileum which is likely related to persistent diarrhea.  General surgery consult was obtained.  No need of surgical intervention at this time.    Diabetes mellitus -Diet controlled.  Currently on Accu-Cheks and SSI.  Blood glucose well controlled.  Depression -Home meds include Adderall, Wellbutrin, Lexapro, Ativan, trazodone -Continue all.  History of alcoholism -He had a recent admission in July to behavioral health for suicidal ideation and alcohol dependence. Reports cutting down on alcohol since that admission. -Not having withdrawal symptoms at this time.  Chronic daily smoker -  Smokes around 5 to 10 cigarettes a day.  Does not want nicotine patch.  Counseled to quit.  Mobility: Encourage ambulation DVT prophylaxis:  Lovenox subcu Code Status:   Code Status: Full Code  Family  Communication:  Expected Discharge:  Anticipate 1 to 2 days to home  Consultants:  Neurosurgery, GI  Procedures:  None  Antimicrobials: Anti-infectives (From admission, onward)   None      Diet Order            Diet clear liquid Room service appropriate? Yes; Fluid consistency: Thin  Diet effective now              Infusions:  . 0.9 % NaCl with KCl 20 mEq / L 125 mL/hr at 10/31/18 0440    Scheduled Meds: . amphetamine-dextroamphetamine  10 mg Oral BID WC  . buPROPion  150 mg Oral Daily  . calcium-vitamin D  1 tablet Oral BID  . enoxaparin (LOVENOX) injection  40 mg Subcutaneous Q24H  . escitalopram  10 mg Oral Daily  . pantoprazole (PROTONIX) IV  40 mg Intravenous Q24H  . thiamine  100 mg Oral Daily    PRN meds: acetaminophen **OR** acetaminophen, HYDROmorphone (DILAUDID) injection, LORazepam, prochlorperazine, traZODone   Objective: Vitals:   10/30/18 2123 10/31/18 0509  BP: 131/77 (!) 105/57  Pulse: (!) 50 (!) 54  Resp: 16 16  Temp: 98.3 F (36.8 C) 98.2 F (36.8 C)  SpO2: 98% 100%    Intake/Output Summary (Last 24 hours) at 10/31/2018 0844 Last data filed at 10/30/2018 1700 Gross per 24 hour  Intake 2014.81 ml  Output -  Net 2014.81 ml   Filed Weights   10/29/18 1741  Weight: 90.7 kg   Weight change:  Body mass index is 25.68 kg/m.   Physical Exam: General exam: Appears calm and comfortable.  Skin: No rashes, lesions or ulcers. HEENT: Atraumatic, normocephalic, supple neck, no obvious bleeding Lungs: Clear to auscultation bilaterally CVS: Regular rate and rhythm, no murmur GI/Abd soft, nondistended, continues to have mild tenderness in lower abdomen, bowel sound present CNS: Alert, awake, oriented x3 Psychiatry: Mood appropriate Extremities: No pedal edema, no calf tenderness  Data Review: I have personally reviewed the laboratory data and studies available.  Recent Labs  Lab 10/29/18 2200 10/30/18 0640  WBC 5.4 5.8  NEUTROABS  2.6 2.6  HGB 15.0 13.2  HCT 43.4 39.7  MCV 92.5 95.2  PLT 136* 107*   Recent Labs  Lab 10/29/18 2200 10/30/18 0640  NA 135 137  K 4.1 4.1  CL 105 106  CO2 23 25  GLUCOSE 84 75  BUN 20 19  CREATININE 1.19 1.28*  CALCIUM 7.9* 7.4*    Terrilee Croak, MD  [facility] 10/31/2018

## 2018-10-31 NOTE — Progress Notes (Addendum)
Subjective: No nausea or vomiting.  Nonspecific diffuse abdominal discomfort.  Is passing flatus.    Objective: Vital signs in last 24 hours: Temp:  [98.2 F (36.8 C)-98.5 F (36.9 C)] 98.2 F (36.8 C) (10/09 0509) Pulse Rate:  [50-54] 54 (10/09 0509) Resp:  [16-17] 16 (10/09 0509) BP: (105-131)/(57-77) 105/57 (10/09 0509) SpO2:  [96 %-100 %] 100 % (10/09 0509) Last BM Date: 10/29/18  Intake/Output from previous day: 10/08 0701 - 10/09 0700 In: 2014.8 [P.O.:600; I.V.:1414.8] Out: -  Intake/Output this shift: Total I/O In: 480 [P.O.:480] Out: -   General appearance: alert, cooperative and no distress GI: Soft with no point tenderness present.  No rigidity is noted.  Bowel sounds are active.  Nondistended.  Lab Results:  Recent Labs    10/29/18 2200 10/30/18 0640  WBC 5.4 5.8  HGB 15.0 13.2  HCT 43.4 39.7  PLT 136* 107*   BMET Recent Labs    10/29/18 2200 10/30/18 0640  NA 135 137  K 4.1 4.1  CL 105 106  CO2 23 25  GLUCOSE 84 75  BUN 20 19  CREATININE 1.19 1.28*  CALCIUM 7.9* 7.4*   PT/INR No results for input(s): LABPROT, INR in the last 72 hours.  Studies/Results: Ct Abdomen Pelvis W Contrast  Result Date: 10/29/2018 CLINICAL DATA:  Abdominal pain EXAM: CT ABDOMEN AND PELVIS WITH CONTRAST TECHNIQUE: Multidetector CT imaging of the abdomen and pelvis was performed using the standard protocol following bolus administration of intravenous contrast. CONTRAST:  138mL OMNIPAQUE IOHEXOL 300 MG/ML  SOLN COMPARISON:  Most recent prior October 22, 2018 FINDINGS: Lower chest: The visualized heart size within normal limits. No pericardial fluid/thickening. No hiatal hernia. The visualized portions of the lungs are clear. Hepatobiliary: The liver is normal in density without focal abnormality.The main portal vein is patent. No evidence of calcified gallstones, gallbladder wall thickening or biliary dilatation. Pancreas: Unremarkable. No pancreatic ductal dilatation  or surrounding inflammatory changes. Spleen: Normal in size without focal abnormality. Adrenals/Urinary Tract: Both adrenal glands appear normal. The kidneys and collecting system appear normal without evidence of urinary tract calculus or hydronephrosis. Bladder is unremarkable. Stomach/Bowel: The stomach is normal in appearance. There is a focally dilated air and fluid-filled loops of ileum seen within the right mid abdomen measuring up to 3.4 cm. There is a focal area of tapering seen at the left mid abdomen, series 2, image 41 and the distal ileal loops appear to be decompressed. Again noted is a moderate amount of colonic stool. Scattered colonic diverticula are noted within the sigmoid colon without surrounding fat stranding changes. There is a mildly dilated air and stool-filled rectum. There does appear to be diffuse posterior rectal wall thickening as on the prior exam.The appendix is normal. Vascular/Lymphatic: There are no enlarged mesenteric, retroperitoneal, or pelvic lymph nodes. Scattered aortic atherosclerotic calcifications are seen without aneurysmal dilatation. Reproductive: The prostate is unremarkable. Other: No evidence of abdominal wall mass or hernia. Musculoskeletal: No acute or significant osseous findings. IMPRESSION: 1. Nonspecific focally dilated loop of ileum within the right mid abdomen which appears new from the prior exam with a gradual area of tapering in the distal ileal loops. This could represent a focal ileus or partial small bowel obstruction. 2. Colonic diverticula. 3. Mild perirectal wall thickening as seen on the prior exam which is non-specific could be due to infectious or inflammatory colitis. Electronically Signed   By: Prudencio Pair M.D.   On: 10/29/2018 23:18    Anti-infectives: Anti-infectives (From admission,  onward)   None      Assessment/Plan: Impression: No clinical evidence of obstruction at this time.  Appreciate GI consultation. Plan: Agree with  advancing diet as tolerated.  Will continue to follow with you.  LOS: 0 days    Aviva Signs 10/31/2018

## 2018-10-31 NOTE — Progress Notes (Addendum)
Subjective: Pain somewhat better than yesterday. Has tolerated his diet today. Is interested in trying to advance diet. Denies N/V. Some persistent (though improved) abdominal pain generalized abdomen. No BM since yesterday. No other GI complaints.  Objective: Vital signs in last 24 hours: Temp:  [98.2 F (36.8 C)-98.5 F (36.9 C)] 98.2 F (36.8 C) (10/09 0509) Pulse Rate:  [50-54] 54 (10/09 0509) Resp:  [16-17] 16 (10/09 0509) BP: (105-131)/(57-77) 105/57 (10/09 0509) SpO2:  [96 %-100 %] 100 % (10/09 0509) Last BM Date: 10/29/18 General:   Alert and oriented, pleasant Head:  Normocephalic and atraumatic. Eyes:  No icterus, sclera clear. Conjuctiva pink.  Heart:  S1, S2 present, no murmurs noted.  Lungs: Clear to auscultation bilaterally, without wheezing, rales, or rhonchi.  Abdomen:  Bowel sounds present, soft, non-distended. Mild to moderate TTP noted generalized abdomen. No HSM or hernias noted. No rebound or guarding. No masses appreciated  Msk:  Symmetrical without gross deformities. Pulses:  Normal bilateral DP pulses noted. Extremities:  Without clubbing or edema. Neurologic:  Alert and  oriented x4;  grossly normal neurologically. Psych:  Alert and cooperative. Normal mood and affect.  Intake/Output from previous day: 10/08 0701 - 10/09 0700 In: 2014.8 [P.O.:600; I.V.:1414.8] Out: -  Intake/Output this shift: Total I/O In: 480 [P.O.:480] Out: -   Lab Results: Recent Labs    10/29/18 2200 10/30/18 0640  WBC 5.4 5.8  HGB 15.0 13.2  HCT 43.4 39.7  PLT 136* 107*   BMET Recent Labs    10/29/18 2200 10/30/18 0640  NA 135 137  K 4.1 4.1  CL 105 106  CO2 23 25  GLUCOSE 84 75  BUN 20 19  CREATININE 1.19 1.28*  CALCIUM 7.9* 7.4*   LFT Recent Labs    10/29/18 2200 10/30/18 0640  PROT 6.3* 5.5*  ALBUMIN 3.1* 2.8*  AST 85* 62*  ALT 52* 42  ALKPHOS 167* 110  BILITOT 0.6 1.6*   PT/INR No results for input(s): LABPROT, INR in the last 72  hours. Hepatitis Panel No results for input(s): HEPBSAG, HCVAB, HEPAIGM, HEPBIGM in the last 72 hours.   Studies/Results: Ct Abdomen Pelvis W Contrast  Result Date: 10/29/2018 CLINICAL DATA:  Abdominal pain EXAM: CT ABDOMEN AND PELVIS WITH CONTRAST TECHNIQUE: Multidetector CT imaging of the abdomen and pelvis was performed using the standard protocol following bolus administration of intravenous contrast. CONTRAST:  171mL OMNIPAQUE IOHEXOL 300 MG/ML  SOLN COMPARISON:  Most recent prior October 22, 2018 FINDINGS: Lower chest: The visualized heart size within normal limits. No pericardial fluid/thickening. No hiatal hernia. The visualized portions of the lungs are clear. Hepatobiliary: The liver is normal in density without focal abnormality.The main portal vein is patent. No evidence of calcified gallstones, gallbladder wall thickening or biliary dilatation. Pancreas: Unremarkable. No pancreatic ductal dilatation or surrounding inflammatory changes. Spleen: Normal in size without focal abnormality. Adrenals/Urinary Tract: Both adrenal glands appear normal. The kidneys and collecting system appear normal without evidence of urinary tract calculus or hydronephrosis. Bladder is unremarkable. Stomach/Bowel: The stomach is normal in appearance. There is a focally dilated air and fluid-filled loops of ileum seen within the right mid abdomen measuring up to 3.4 cm. There is a focal area of tapering seen at the left mid abdomen, series 2, image 41 and the distal ileal loops appear to be decompressed. Again noted is a moderate amount of colonic stool. Scattered colonic diverticula are noted within the sigmoid colon without surrounding fat stranding changes. There is a  mildly dilated air and stool-filled rectum. There does appear to be diffuse posterior rectal wall thickening as on the prior exam.The appendix is normal. Vascular/Lymphatic: There are no enlarged mesenteric, retroperitoneal, or pelvic lymph nodes.  Scattered aortic atherosclerotic calcifications are seen without aneurysmal dilatation. Reproductive: The prostate is unremarkable. Other: No evidence of abdominal wall mass or hernia. Musculoskeletal: No acute or significant osseous findings. IMPRESSION: 1. Nonspecific focally dilated loop of ileum within the right mid abdomen which appears new from the prior exam with a gradual area of tapering in the distal ileal loops. This could represent a focal ileus or partial small bowel obstruction. 2. Colonic diverticula. 3. Mild perirectal wall thickening as seen on the prior exam which is non-specific could be due to infectious or inflammatory colitis. Electronically Signed   By: Prudencio Pair M.D.   On: 10/29/2018 23:18    Assessment: Pleasant 53 year old male with history of alcohol abuse, diabetes, sleep apnea presenting with a 3 to 4-week history of abdominal pain associated with vomiting and diarrhea. He has had some mild circumferential thickening of the rectum and distal sigmoid colon noted on all 3 CTs in the past month.  Last CT with focally dilated loop of ileum new from prior CTs concerning for focal ileus or partial obstruction, evaluated by surgery this admission who felt findings were nonspecific.  He will need a colonoscopy, likely as an outpatient after current illness resolves, as CT findings likely does not fully explain his persistent vomiting, diarrhea and abdominal pain.  Differential diagnosis includes infectious colitis, alcohol-related, less likely IBD or malignancy.  Persisting vomiting could be due to alcoholic gastritis.   Mild thrombocytopenia: In the setting of chronic alcohol abuse, questionable small perihepatic varices on 10/22/2018 CT raising concern for possible portal venous hypertension although liver does not appear obviously cirrhotic and spleen normal in size.    Elevated LFTs: Query previous ETOH hepatitis. Trending downward.  Today he continues with persistent, though  improved, abdominal pain. Denies N/V. No bowel movement since yesterday. GI path panel pending collection due to solid stool yesterday. Tolerated clear liquid diet and is wanting to try a diet advancement.  Plan: 1. ETOH cessation 2. Continued PPI 3. If liquid stool, collect CDiff and GI Path panel 4. Colonoscopy +/- EGD inpatient vs outpatient pending clinical progress 5. Monitor LFTs 6. Advance diet to full liquids 7. Supportive measures   Thank you for allowing [place] to participate in the care of Wende Crease, DNP, AGNP-C Adult & Gerontological Nurse Practitioner [facility]     LOS: 0 days    10/31/2018, 11:51 AM

## 2018-11-01 LAB — COMPREHENSIVE METABOLIC PANEL
ALT: 43 U/L (ref 0–44)
AST: 76 U/L — ABNORMAL HIGH (ref 15–41)
Albumin: 2.9 g/dL — ABNORMAL LOW (ref 3.5–5.0)
Alkaline Phosphatase: 68 U/L (ref 38–126)
Anion gap: 7 (ref 5–15)
BUN: 5 mg/dL — ABNORMAL LOW (ref 6–20)
CO2: 25 mmol/L (ref 22–32)
Calcium: 8.2 mg/dL — ABNORMAL LOW (ref 8.9–10.3)
Chloride: 108 mmol/L (ref 98–111)
Creatinine, Ser: 0.94 mg/dL (ref 0.61–1.24)
GFR calc Af Amer: >60 mL/min (ref >60)
GFR calc non Af Amer: >60 mL/min (ref >60)
Glucose, Bld: 72 mg/dL (ref 70–99)
Potassium: 3.9 mmol/L (ref 3.5–5.1)
Sodium: 140 mmol/L (ref 135–145)
Total Bilirubin: 2 mg/dL — ABNORMAL HIGH (ref 0.3–1.2)
Total Protein: 5.6 g/dL — ABNORMAL LOW (ref 6.5–8.1)

## 2018-11-01 MED ORDER — POLYETHYLENE GLYCOL 3350 17 G PO PACK
17.0000 g | PACK | Freq: Every day | ORAL | Status: DC
  Administered 2018-11-01 – 2018-11-02 (×2): 17 g via ORAL
  Filled 2018-11-01 (×2): qty 1

## 2018-11-01 NOTE — Plan of Care (Signed)

## 2018-11-01 NOTE — Progress Notes (Addendum)
PROGRESS NOTE  Brandon Miller  DOB: 1965-05-01  PCP: Celene Squibb, MD FS:[id number]  DOA: 10/29/2018  LOS: 1 day   Brief narrative: Brandon Miller is a 53 y.o. male with past medical history significant for type II diabetes, sleep apnea, alcohol abuse, anxiety/depression, rheumatoid arthritis. He presented to the ED on 10/7 with several week history of lower abdominal pain associated with vomiting and diarrhea.   He had a recent admission in July to behavioral health for suicidal ideation and alcohol dependence. Reports cutting down on alcohol since that admission. Patient was seen in the ED on September 19 for abdominal pain, vomiting and diarrhea.  Started on a course of Cipro and Flagyl but without improvement.  10/6, patient went to was long ED but left before being seen because of long wait time.  Return to the ED again on 10/7 with persistent lower abdominal pain and diarrhea.    CT scan abdomen 9/19 showed mild circumferential thickening in the distal sigmoid colon and rectum.  No dilated bowel.  Findings suggestive of avascular necrosis of the right femoral head without subcortical collapse. CT scan of abdomen 9/30 showed some progression of circumferential wall thickening of the distal sigmoid and rectum but no mesenteric stranding.  Suggestion of small caliber perihepatic varices raising question of portal hypertension although the liver does not appear obviously cirrhotic.   CT scan of abdomen pelvis from 10/7 showed nonspecific focally dilated loop of ileum within the right midabdomen which appears new from prior study with a gradual area of tapering in the distal ileal loops, mild area rectal wall thickening as seen on prior exam.  In the ED, WBC count 5.4, hemoglobin 15, platelet 136, lipase normal.  Liver enzymes improved to comparison from a week ago.  Creatinine 1.28  He was admitted to hospitalist medicine service for further evaluation and management.  Subjective:  -  Willing to  try more food - No Nausea, Vomiting or Diarrhea  No fever  Or chills    Assessment/Plan: Persistent gastroenteritis  -Presented with nausea, vomiting, diarrhea, abdominal pain ongoing for last 4-6 weeks. -No fever, WBC count normal. -CT scan done 3 times in last 1 month.  No intra-abdominal pathology to explain the severity of his symptoms. -GI consultation obtained. -Vomiting could be due to alcoholic gastritis. -Recommended Protonix.  Stool sample sent for C. difficile, GI pathogen panel. --Continue to advance diet -No need of antibiotics at this time.  Completed 2 weeks course of Cipro and Flagyl as an outpatient. -GI consult appreciated, -Plan is for EGD or colonoscopy as outpatient, -Follow-up outpatient with GI appointment for 11/05/2018  AKI -Creatinine normal at baseline, trending up, 1.28 today. -Currently on normal saline with 20 of potassium rider at 125/h.  Continue the same.  Ileus CT scan 10/7 suggested nonspecific focally dilated loop of ileum which is likely related to persistent diarrhea.  General surgery consult was obtained.  No need of surgical intervention at this time.   -Awaiting for return of bowel function and better tolerance of oral intake prior to discharge  Diabetes mellitus -Diet controlled.  Currently on Accu-Cheks and SSI.  Blood glucose well controlled.  Depression -Home meds include Adderall, Wellbutrin, Lexapro, Ativan, trazodone Stable at this time, continue same  History of alcoholism -He had a recent admission in July to behavioral health for suicidal ideation and alcohol dependence. Reports cutting down on alcohol since that admission. -Not having withdrawal symptoms at this time.  Chronic daily smoker -Not interested in smoking  cessation.  Does not want nicotine patch.   Mobility: Encourage ambulation DVT prophylaxis:  Lovenox subcu Code Status:   Code Status: Full Code  Family Communication:  Expected Discharge:   --Awaiting for return of bowel function and better tolerance of oral intake prior to discharge  Consultants:  gen surgery, GI  Procedures:  None  Antimicrobials: Anti-infectives (From admission, onward)   None      Diet Order            DIET SOFT Room service appropriate? Yes; Fluid consistency: Thin  Diet effective now              Infusions:  . 0.9 % NaCl with KCl 20 mEq / L 50 mL/hr at 11/01/18 1559    Scheduled Meds: . amphetamine-dextroamphetamine  10 mg Oral BID WC  . buPROPion  150 mg Oral Daily  . calcium-vitamin D  1 tablet Oral BID  . enoxaparin (LOVENOX) injection  40 mg Subcutaneous Q24H  . escitalopram  10 mg Oral Daily  . pantoprazole  40 mg Oral BID AC  . polyethylene glycol  17 g Oral Daily  . thiamine  100 mg Oral Daily    PRN meds: acetaminophen **OR** acetaminophen, HYDROmorphone (DILAUDID) injection, LORazepam, prochlorperazine, traZODone   Objective: Vitals:   11/01/18 0536 11/01/18 1321  BP: (!) 91/47 104/67  Pulse: (!) 51 (!) 56  Resp: 20   Temp: 98.2 F (36.8 C) 98.1 F (36.7 C)  SpO2: 100% 97%    Intake/Output Summary (Last 24 hours) at 11/01/2018 1722 Last data filed at 11/01/2018 1559 Gross per 24 hour  Intake 5012.67 ml  Output -  Net 5012.67 ml   Filed Weights   10/29/18 1741  Weight: 90.7 kg   Weight change:  Body mass index is 25.68 kg/m.   Physical Exam: General exam: Appears calm and comfortable.  Skin: No rashes, lesions or ulcers. HEENT: Atraumatic, normocephalic, supple neck, no obvious bleeding Lungs: Clear to auscultation bilaterally CVS: Regular rate and rhythm, no murmur GI/Abd soft, NT, mildly tender, bowel sound present CNS: Alert, awake, oriented x3 Psychiatry: Mood appropriate Extremities: No pedal edema, no calf tenderness  Data Review:   Recent Labs  Lab 10/29/18 2200 10/30/18 0640  WBC 5.4 5.8  NEUTROABS 2.6 2.6  HGB 15.0 13.2  HCT 43.4 39.7  MCV 92.5 95.2  PLT 136* 107*    Recent Labs  Lab 10/29/18 2200 10/30/18 0640 11/01/18 0613  NA 135 137 140  K 4.1 4.1 3.9  CL 105 106 108  CO2 23 25 25   GLUCOSE 84 75 72  BUN 20 19 5*  CREATININE 1.19 1.28* 0.94  CALCIUM 7.9* 7.4* 8.2*   Roxan Hockey, MD  [facility] 11/01/2018

## 2018-11-01 NOTE — Progress Notes (Signed)
  Subjective: Is passing flatus, but states he has intermittent abdominal discomfort in the left lower quadrant.  No nausea or vomiting have been noted.  He is tolerating a full liquid diet.  Objective: Vital signs in last 24 hours: Temp:  [97.9 F (36.6 C)-98.2 F (36.8 C)] 98.2 F (36.8 C) (10/10 0536) Pulse Rate:  [47-52] 51 (10/10 0536) Resp:  [18-20] 20 (10/10 0536) BP: (91-114)/(47-69) 91/47 (10/10 0536) SpO2:  [99 %-100 %] 100 % (10/10 0536) Last BM Date: 10/29/18  Intake/Output from previous day: 10/09 0701 - 10/10 0700 In: 5973.7 [P.O.:1940; I.V.:4033.7] Out: -  Intake/Output this shift: No intake/output data recorded.  General appearance: alert, cooperative and no distress GI: Soft, flat.  Bowel sounds present.  No specific point tenderness noted.  No rigidity is noted.  Nondistended.  Lab Results:  Recent Labs    10/29/18 2200 10/30/18 0640  WBC 5.4 5.8  HGB 15.0 13.2  HCT 43.4 39.7  PLT 136* 107*   BMET Recent Labs    10/30/18 0640 11/01/18 0613  NA 137 140  K 4.1 3.9  CL 106 108  CO2 25 25  GLUCOSE 75 72  BUN 19 5*  CREATININE 1.28* 0.94  CALCIUM 7.4* 8.2*   PT/INR No results for input(s): LABPROT, INR in the last 72 hours.  Studies/Results: No results found.  Anti-infectives: Anti-infectives (From admission, onward)   None      Assessment/Plan: Impression: Ileus versus partial small bowel obstruction.  Favor more GI dysmotility issue.  Will advance to soft diet.  No need for acute surgical intervention at this time.  LOS: 1 day    Aviva Signs 11/01/2018

## 2018-11-01 NOTE — Progress Notes (Signed)
Patient is feeling better he has some localized left lower quadrant abdominal pain; just had a formed BM Tolerating diet.  No nausea or vomiting  Vital signs in last 24 hours: Temp:  [98 F (36.7 C)-98.2 F (36.8 C)] 98.2 F (36.8 C) (10/10 0536) Pulse Rate:  [51-52] 51 (10/10 0536) Resp:  [20] 20 (10/10 0536) BP: (91-114)/(47-69) 91/47 (10/10 0536) SpO2:  [99 %-100 %] 100 % (10/10 0536) Last BM Date: 11/01/18 General:   Alert,   pleasant and cooperative in NAD Abdomen: Nondistended positive bowel sounds mild localized left lower quadrant abdominal tenderness. Extremities:  Without clubbing or edema.    Formed stool seen in bathroom  Intake/Output from previous day: 10/09 0701 - 10/10 0700 In: 5973.7 [P.O.:1940; I.V.:4033.7] Out: -  Intake/Output this shift: Total I/O In: 240 [P.O.:240] Out: -   Lab Results: Recent Labs    10/29/18 2200 10/30/18 0640  WBC 5.4 5.8  HGB 15.0 13.2  HCT 43.4 39.7  PLT 136* 107*   BMET Recent Labs    10/29/18 2200 10/30/18 0640 11/01/18 0613  NA 135 137 140  K 4.1 4.1 3.9  CL 105 106 108  CO2 23 25 25   GLUCOSE 84 75 72  BUN 20 19 5*  CREATININE 1.19 1.28* 0.94  CALCIUM 7.9* 7.4* 8.2*   LFT Recent Labs    11/01/18 0613  PROT 5.6*  ALBUMIN 2.9*  AST 76*  ALT 43  ALKPHOS 68  BILITOT 2.0*   PT/INR No results for input(s): LABPROT, INR in the last 72 hours. Hepatitis Panel No results for input(s): HEPBSAG, HCVAB, HEPAIGM, HEPBIGM in the last 72 hours. C-Diff No results for input(s): CDIFFTOX in the last 72 hours.   Impression:  Abdominal pain overall improved and localized to the left lower quadrant.  He is not having diarrhea.  Stools have normalized. Tolerating his diet without nausea or vomiting.  Mild elevation in LFTs (with slight uptick today)  -   nonspecific.   Recommendations:  Continue supportive measures.  Obviously, would be best to avoid alcohol moving forward.  We will follow-up on LFTs.  If  continued stability observed, could consider discharge later today or tomorrow morning.  We have arranged an early interval follow-up in our office on October 14.  I would anticipate an EGD and a colonoscopy as an outpatient in the near future.

## 2018-11-02 MED ORDER — FOLIC ACID 1 MG PO TABS: 1.0000 mg | ORAL_TABLET | Freq: Every day | ORAL | 3 refills | Status: DC

## 2018-11-02 MED ORDER — PANTOPRAZOLE SODIUM 40 MG PO TBEC: 40.0000 mg | DELAYED_RELEASE_TABLET | Freq: Two times a day (BID) | ORAL | 1 refills | Status: DC

## 2018-11-02 MED ORDER — THIAMINE HCL 100 MG PO TABS: 100.0000 mg | ORAL_TABLET | Freq: Every day | ORAL | 3 refills | Status: DC

## 2018-11-02 NOTE — Progress Notes (Signed)
  Subjective: Patient states his abdominal discomfort is minimal.  He is moving his bowels and passing flatus.  Tolerating regular diet well.  Objective: Vital signs in last 24 hours: Temp:  [98.1 F (36.7 C)-98.3 F (36.8 C)] 98.3 F (36.8 C) (10/10 2056) Pulse Rate:  [51-56] 51 (10/10 2056) Resp:  [16] 16 (10/10 2056) BP: (104-108)/(63-67) 108/63 (10/10 2056) SpO2:  [97 %] 97 % (10/10 2056) Last BM Date: 11/01/18  Intake/Output from previous day: 10/10 0701 - 10/11 0700 In: 1219 [P.O.:720; I.V.:499] Out: -  Intake/Output this shift: No intake/output data recorded.  General appearance: alert, cooperative and no distress GI: soft, non-tender; bowel sounds normal; no masses,  no organomegaly and No specific point tenderness noted.  Lab Results:  No results for input(s): WBC, HGB, HCT, PLT in the last 72 hours. BMET Recent Labs    11/01/18 0613  NA 140  K 3.9  CL 108  CO2 25  GLUCOSE 72  BUN 5*  CREATININE 0.94  CALCIUM 8.2*   PT/INR No results for input(s): LABPROT, INR in the last 72 hours.  Studies/Results: No results found.  Anti-infectives: Anti-infectives (From admission, onward)   None      Assessment/Plan: Impression: Ileus resolving.  No need for surgical intervention at this time.  May have adhesive disease present, but it is not critical at this point. Plan: Okay for discharge from surgery standpoint.  Patient to follow-up with GI.  LOS: 2 days    Aviva Signs 11/02/2018

## 2018-11-02 NOTE — Discharge Summary (Signed)
Brandon Miller, is a 53 y.o. male  DOB December 28, 1965  MRN [id number].  Admission date:  10/29/2018  Admitting Physician  Reubin Milan, MD  Discharge Date:  11/02/2018   Primary MD  Celene Squibb, MD  Recommendations for primary care physician for things to follow:  - 1) take medications as advised 2) patient follow-up with mental health professional/counselor strongly advised 3) please abstinence from alcohol and tobacco strongly advised 4) please follow-up with gastroenterologist as scheduled on 11/05/2018 for reevaluation and recheck--you most likely need endoluminal evaluation and select EGD/colonoscopy as outpatient in the near future 5) you have been given resources with regards to getting help with alcohol abuse and depressive disorder--- please utilize the resources offered to you  Admission Diagnosis  Generalized abdominal pain [R10.84]   Discharge Diagnosis  Generalized abdominal pain [R10.84]    Principal Problem:   Ileus, unspecified ([place]) Active Problems:   Diabetes mellitus, type 2 ([place])   Depression   Tobacco abuse   Sleep apnea   Generalized abdominal pain   Non-intractable vomiting   Diarrhea   ETOH abuse   Acute gastroenteritis      Past Medical History:  Diagnosis Date   Allergy    Anemia    Anxiety    Arthritis    RA   Complication of anesthesia    hard to wake up-was told he may have sleep apnea-never tested   Depression    Diabetes mellitus without complication ([place])    Sleep apnea    Snores     Past Surgical History:  Procedure Laterality Date   ELBOW SURGERY Right 2010   Dr. Percell Miller   HERNIA REPAIR     Right inguinal   KNEE ARTHROSCOPY WITH MEDIAL MENISECTOMY Right 06/11/2013   Procedure: RIGHT KNEE ARTHROSCOPY WITH MEDIAL MENISECTOMY AND CHONDROPLASTY;  Surgeon: Ninetta Lights, MD;  Location: [facility];  Service: Orthopedics;   Laterality: Right;  Chondroplasty, loose bodies, medial menisectomy, lysis of adhesions.   KNEE SURGERY Right 2011   right ankle     SHOULDER SURGERY Right 2010   Dr. Percell Miller   TOTAL KNEE ARTHROPLASTY Right 08/25/2014   Procedure: TOTAL KNEE ARTHROPLASTY;  Surgeon: Ninetta Lights, MD;  Location: [facility];  Service: Orthopedics;  Laterality: Right;       HPI  from the history and physical done on the day of admission:    - Chief Complaint: Nausea vomiting and diarrhea.  HPI: Brandon Miller is a 53 y.o. male with medical history significant of seasonal allergies, chronic anemia, anxiety, depression, rheumatoid arthritis, type 2 diabetes, sleep apnea who is coming to the emergency department due to having nausea, vomiting and diarrhea for about a month.  Per patient, he has usually 8-10 loose stool BMs daily.  He has single daily episode of emesis on some days usually before lunch.  He was given a course of metronidazole and ciprofloxacin without significant results.  He was seen at [facility] long [facility] yesterday and discharged home with referral  to GI.  He has not been able to have an appointment set up with GI.  He constipation, melena or hematochezia.  No dysuria, frequency or hematuria.  Denies fever, chills, sore throat, rhinorrhea, dyspnea, wheezing or hemoptysis.  No chest pain, palpitations, dizziness, diaphoresis, PND, orthopnea or pitting edema of the lower extremities.  No polyuria, polydipsia, polyphagia or blurred vision.  ED Course: She will vital signs temperature 98.5 F, pulse 90, respiration 18, blood pressure 131/86 mmHg and O2 sat 99% on room air.  The patient was given a 1000 mL normal saline bolus, 4 mg of morphine IVP and Zofran 4 mg IVP.  His CBC was normal, except for platelets of 136.  Lipase was 38 units/L.  CMP shows normal renal function and normal electrolytes when calcium is corrected to albumin.  Total protein 6.3 and albumin 3.1 g/dL.  AST 85, ALT 52 and alkaline  phosphatase 167 units/L.  Imaging: CT abdomen/pelvis with contrast showed nonspecific focally dilated loop of ileum within the right midabdomen which appears new from the prior exam with a gradual area of tapering in the distal ileal loops.  These could represent a focal ileus or partial small bowel obstruction.  There was also colonic diverticuli and mild perirectal wall thickening as seen on prior exam which could be due to infectious or inflammatory colitis.     Hospital Course:      -Brief narrative: Brandon Miller a 52 y.o.malewith past medical history significant for type II diabetes, sleep apnea, alcohol abuse, anxiety/depression, rheumatoid arthritis. He presented to the ED on 10/7 with several week history of lower abdominal pain associated with vomiting and diarrhea.  He had a recent admission in July to behavioral health for suicidal ideation and alcohol dependence.Reports cutting down on alcohol since that admission. Patient was seen in the ED on September 19 for abdominal pain, vomiting and diarrhea.  Started on a course of Cipro and Flagyl but without improvement.  10/6, patient went to was long ED but left before being seen because of long wait time.  Return to the ED again on 10/7 with persistent lower abdominal pain and diarrhea.    CT scan abdomen 9/19 showed mild circumferential thickening in the distal sigmoid colon and rectum. No dilated bowel. Findings suggestive of avascular necrosis of the right femoral head without subcortical collapse. CT scan of abdomen 9/30 showed some progression of circumferential wall thickening of the distal sigmoid and rectum but no mesenteric stranding. Suggestion of small caliber perihepatic varices raising question of portal hypertension although the liver does not appear obviously cirrhotic.  CT scan of abdomen pelvis from 10/7 showed nonspecific focally dilated loop of ileum within the right midabdomen which appears new from prior  study with a gradual area of tapering in the distal ileal loops, mildarea rectal wall thickening as seen on prior exam.  In the ED, WBC count 5.4, hemoglobin 15, platelet 136, lipase normal.  Liver enzymes improved to comparison from a week ago.  Creatinine 1.28  He was admitted to hospitalist medicine service for further evaluation and management.  A/p Persistent Gastroenteritis  -Presented with nausea, vomiting, diarrhea, abdominal pain ongoing for  4-6 weeks PTA. -No fever, WBC count normal. -CT scan done 3 times in last 1 month.  No intra-abdominal pathology to explain the severity of his symptoms. -Emesis resolved, abdominal pain improved significantly --Unable to obtain Stool sample sent for C. difficile, GI pathogen pane as diarrhea resolved -No need of antibiotics at this time.  Completed  2 weeks course of Cipro and Flagyl as an outpatient. -GI consult appreciated, -Plan is for EGD or colonoscopy as outpatient, -Follow-up outpatient with GI appointment for 11/05/2018  AKI -Creatinine normal at baseline,  -Resolved with IV fluids, creatinine back to 0.9  Ileus CT scan 10/29/18 suggested nonspecific focally dilated loop of ileum which is likely related to persistent diarrhea.  General surgery consult was obtained.  No need of surgical intervention at this time.   -Eating and drinking well, having semisolid stools   Diabetes mellitus -Diet controlled.     Depression -Home meds include Adderall, Wellbutrin, Lexapro, Ativan, trazodone Stable at this time, continue same  History of alcoholism -He had a recent admission in July to behavioral health for suicidal ideation and alcohol dependence.Reports cutting down on alcohol since that admission. -Not having withdrawal symptoms at this time.  Chronic daily smoker -Not interested in smoking cessation.  Does not want nicotine patch.   Code Status:  Code Status: Full Code   Consultants:  gen surgery,  GI  Procedures:  None  Discharge Condition: stable  Follow UP  Follow-up Information    [place] Follow up on 11/17/2018.   Why: Hours are 8:30-5:30.  this is a clinic for folks with no income and no insurance. Bring your ID and list of current meds.  Appointment is Monday at 2:30. Contact information: [place] [phone]          Diet and Activity recommendation:  As advised  Discharge Instructions    Discharge Instructions    Call MD for:  difficulty breathing, headache or visual disturbances   Complete by: As directed    Call MD for:  extreme fatigue   Complete by: As directed    Call MD for:  persistant dizziness or light-headedness   Complete by: As directed    Call MD for:  persistant nausea and vomiting   Complete by: As directed    Call MD for:  severe uncontrolled pain   Complete by: As directed    Call MD for:  temperature >100.4   Complete by: As directed    Diet - low sodium heart healthy   Complete by: As directed    Discharge instructions   Complete by: As directed    1) take medications as advised 2) patient follow-up with mental health professional/counselor strongly advised 3) please abstinence from alcohol and tobacco strongly advised 4) please follow-up with gastroenterologist as scheduled on 11/05/2018 for reevaluation and recheck--you most likely need endoluminal evaluation and select EGD/colonoscopy as outpatient in the near future 5) you have been given resources with regards to getting help with alcohol abuse and depressive disorder--- please utilize the resources offered to you   Face-to-face encounter (required for Medicare/Medicaid patients)   Complete by: As directed    I Marlowe Aschoff Dahal certify that this patient is under my care and that I, or a nurse practitioner or physician's assistant working with me, had a face-to-face encounter that meets the physician  face-to-face encounter requirements with this patient on 10/31/2018. The encounter with the patient was in whole, or in part for the following medical condition(s) which is the primary reason for home health care (List medical condition): persistent diarrhea, alcoholism.   The encounter with the patient was in whole, or in part, for the following medical condition, which is the primary reason for home health care: persistent diarrhea, abdominal pain, alcohol use   I certify  that, based on my findings, the following services are medically necessary home health services: Nursing   Reason for Medically Necessary Home Health Services: Skilled Nursing- Change/Decline in Patient Status   My clinical findings support the need for the above services: Unsafe ambulation due to balance issues   Further, I certify that my clinical findings support that this patient is homebound due to: Unable to leave home safely without assistance   Home Health   Complete by: As directed    To provide the following care/treatments: RN   Increase activity slowly   Complete by: As directed         Discharge Medications     Allergies as of 11/02/2018      Reactions   Daypro [oxaprozin] Other (See Comments)   'tears his stomach up"   Diclofenac Sodium Other (See Comments)   dizzy   Indocin [indomethacin] Nausea And Vomiting   Zanaflex [tizanidine Hcl] Swelling      Medication List    STOP taking these medications   HYDROcodone-acetaminophen 5-325 MG tablet Commonly known as: NORCO/VICODIN     TAKE these medications   acetaminophen 325 MG tablet Commonly known as: TYLENOL Take 650 mg by mouth every 6 (six) hours as needed.   Adderall XR 25 MG 24 hr capsule Generic drug: amphetamine-dextroamphetamine Take 25 mg by mouth daily.   buPROPion 150 MG 24 hr tablet Commonly known as: WELLBUTRIN XL Take 150 mg by mouth daily.   calcium-vitamin D 250-100 MG-UNIT tablet Take 1 tablet by mouth 2 (two) times  daily.   escitalopram 10 MG tablet Commonly known as: LEXAPRO Take 10 mg by mouth daily.   folic acid 1 MG tablet Commonly known as: FOLVITE Take 1 tablet (1 mg total) by mouth daily.   LORazepam 1 MG tablet Commonly known as: ATIVAN Take 1 mg by mouth 2 (two) times daily as needed for anxiety.   pantoprazole 40 MG tablet Commonly known as: PROTONIX Take 1 tablet (40 mg total) by mouth 2 (two) times daily before a meal.   thiamine 100 MG tablet Take 1 tablet (100 mg total) by mouth daily.   traZODone 150 MG tablet Commonly known as: DESYREL Take 1 tablet (150 mg total) by mouth at bedtime. What changed:   when to take this  reasons to take this       Major procedures and Radiology Reports - PLEASE review detailed and final reports for all details, in brief -   Ct Abdomen Pelvis W Contrast  Result Date: 10/29/2018 CLINICAL DATA:  Abdominal pain EXAM: CT ABDOMEN AND PELVIS WITH CONTRAST TECHNIQUE: Multidetector CT imaging of the abdomen and pelvis was performed using the standard protocol following bolus administration of intravenous contrast. CONTRAST:  1108mL OMNIPAQUE IOHEXOL 300 MG/ML  SOLN COMPARISON:  Most recent prior October 22, 2018 FINDINGS: Lower chest: The visualized heart size within normal limits. No pericardial fluid/thickening. No hiatal hernia. The visualized portions of the lungs are clear. Hepatobiliary: The liver is normal in density without focal abnormality.The main portal vein is patent. No evidence of calcified gallstones, gallbladder wall thickening or biliary dilatation. Pancreas: Unremarkable. No pancreatic ductal dilatation or surrounding inflammatory changes. Spleen: Normal in size without focal abnormality. Adrenals/Urinary Tract: Both adrenal glands appear normal. The kidneys and collecting system appear normal without evidence of urinary tract calculus or hydronephrosis. Bladder is unremarkable. Stomach/Bowel: The stomach is normal in appearance.  There is a focally dilated air and fluid-filled loops of ileum seen within the right  mid abdomen measuring up to 3.4 cm. There is a focal area of tapering seen at the left mid abdomen, series 2, image 41 and the distal ileal loops appear to be decompressed. Again noted is a moderate amount of colonic stool. Scattered colonic diverticula are noted within the sigmoid colon without surrounding fat stranding changes. There is a mildly dilated air and stool-filled rectum. There does appear to be diffuse posterior rectal wall thickening as on the prior exam.The appendix is normal. Vascular/Lymphatic: There are no enlarged mesenteric, retroperitoneal, or pelvic lymph nodes. Scattered aortic atherosclerotic calcifications are seen without aneurysmal dilatation. Reproductive: The prostate is unremarkable. Other: No evidence of abdominal wall mass or hernia. Musculoskeletal: No acute or significant osseous findings. IMPRESSION: 1. Nonspecific focally dilated loop of ileum within the right mid abdomen which appears new from the prior exam with a gradual area of tapering in the distal ileal loops. This could represent a focal ileus or partial small bowel obstruction. 2. Colonic diverticula. 3. Mild perirectal wall thickening as seen on the prior exam which is non-specific could be due to infectious or inflammatory colitis. Electronically Signed   By: Prudencio Pair M.D.   On: 10/29/2018 23:18   Ct Abdomen Pelvis W Contrast  Result Date: 10/22/2018 CLINICAL DATA:  53 year old male with abdominal pain, nausea vomiting. Possible distal colitis on CT Abdomen and Pelvis earlier this month. No improvement since that time. EXAM: CT ABDOMEN AND PELVIS WITH CONTRAST TECHNIQUE: Multidetector CT imaging of the abdomen and pelvis was performed using the standard protocol following bolus administration of intravenous contrast. CONTRAST:  145mL OMNIPAQUE IOHEXOL 300 MG/ML  SOLN COMPARISON:  CT Abdomen and Pelvis 10/11/2018. FINDINGS: Lower  chest: Negative. Hepatobiliary: Liver and gallbladder are within normal limits. Pancreas: Negative. Spleen: Within normal limits. Adrenals/Urinary Tract: Normal adrenal glands. Symmetric bilateral renal enhancement and contrast excretion. Normal proximal ureters. Unremarkable urinary bladder. Stomach/Bowel: Much of the large bowel is decompressed today. The sigmoid colon is redundant. There is gaseous distension of the mid sigmoid in the right lower quadrant and that segment appears normal on series 2, image 62., but distally the large bowel is decompressed including in the rectum, where there is suggestion of circumferential rectal wall thickening up to 11 millimeters on series 2, image 81. This is progressed from the earlier CT, although there remains no convincing mesenteric stranding in the segments. The descending colon is within normal limits. There is fluid in the transverse colon. The hepatic flexure is redundant containing fluid. The right colon is negative aside from fluid content. Normal appendix on series 2, image 56. Negative terminal ileum. Fluid-filled but nondilated small bowel throughout the abdomen. Decompressed stomach. No free air. No free fluid. Vascular/Lymphatic: Aortoiliac calcified atherosclerosis. Major arterial structures remain patent. Portal venous system is patent. There are questionable small perihepatic varices (such as on series 2, image 22). There may be recanalization of the paraumbilical vein, which appears to enter the falciform ligament on image 21. Reproductive: Negative. Other: No pelvic free fluid. Musculoskeletal: No acute osseous abnormality identified. IMPRESSION: 1. Normal appendix. Fluid throughout non-dilated small bowel and proximal colon to the mid transverse. Distal to that the large bowel is decompressed. And although there is no mesenteric stranding, there is continued evidence of circumferential wall thickening in the distal sigmoid and rectum. Differential  considerations are generalized gastroenteritis versus colitis. 2. Suggestion of small caliber perihepatic varices, raising the possibility of portal venous hypertension although the liver does not appear obviously cirrhotic. Electronically Signed   By:  Genevie Ann M.D.   On: 10/22/2018 23:06   Ct Abdomen Pelvis W Contrast  Result Date: 10/11/2018 CLINICAL DATA:  Acute abdominal pain, rash EXAM: CT ABDOMEN AND PELVIS WITH CONTRAST TECHNIQUE: Multidetector CT imaging of the abdomen and pelvis was performed using the standard protocol following bolus administration of intravenous contrast. CONTRAST:  160mL OMNIPAQUE IOHEXOL 300 MG/ML  SOLN COMPARISON:  None. FINDINGS: Lower chest: No acute abnormality. Hepatobiliary: No focal liver abnormality is seen. No gallstones, gallbladder wall thickening, or biliary dilatation. Pancreas: Unremarkable. No pancreatic ductal dilatation or surrounding inflammatory changes. Spleen: Normal in size without focal abnormality. Adrenals/Urinary Tract: Adrenal glands are unremarkable. Kidneys are normal, without renal calculi, focal lesion, or hydronephrosis. Bladder is unremarkable. Stomach/Bowel: Stomach is within normal limits. Appendix appears normal. There is mild circumferential thickening the distal sigmoid colon and rectum. No pericolonic inflammatory changes. No dilated loops of bowel. Vascular/Lymphatic: No significant vascular findings are present. No enlarged abdominal or pelvic lymph nodes. Reproductive: Prostate is unremarkable. Other: No abdominal wall hernia or abnormality. No abdominopelvic ascites. Musculoskeletal: Geographic sclerotic and lucent changes of the superior right femoral head suggesting AVN. Femoral head contour is maintained without collapse. No fractures. IMPRESSION: 1. Mild circumferential thickening of the rectum and distal sigmoid colon suggesting a nonspecific colitis. 2. Findings suggestive of avascular necrosis of the right femoral head without  subcortical collapse. Electronically Signed   By: Davina Poke M.D.   On: 10/11/2018 21:20    Micro Results   Recent Results (from the past 240 hour(s))  SARS Coronavirus 2 [facility] order, Performed in [facility] hospital lab)     Status: None   Collection Time: 10/30/18 12:51 AM  Result Value Ref Range Status   SARS Coronavirus 2 NEGATIVE NEGATIVE Final    Comment: (NOTE) If result is NEGATIVE SARS-CoV-2 target nucleic acids are NOT DETECTED. The SARS-CoV-2 RNA is generally detectable in upper and lower  respiratory specimens during the acute phase of infection. The lowest  concentration of SARS-CoV-2 viral copies this assay can detect is 250  copies / mL. A negative result does not preclude SARS-CoV-2 infection  and should not be used as the sole basis for treatment or other  patient management decisions.  A negative result may occur with  improper specimen collection / handling, submission of specimen other  than nasopharyngeal swab, presence of viral mutation(s) within the  areas targeted by this assay, and inadequate number of viral copies  (<250 copies / mL). A negative result must be combined with clinical  observations, patient history, and epidemiological information. If result is POSITIVE SARS-CoV-2 target nucleic acids are DETECTED. The SARS-CoV-2 RNA is generally detectable in upper and lower  respiratory specimens dur ing the acute phase of infection.  Positive  results are indicative of active infection with SARS-CoV-2.  Clinical  correlation with patient history and other diagnostic information is  necessary to determine patient infection status.  Positive results do  not rule out bacterial infection or co-infection with other viruses. If result is PRESUMPTIVE POSTIVE SARS-CoV-2 nucleic acids MAY BE PRESENT.   A presumptive positive result was obtained on the submitted specimen  and confirmed on repeat testing.  While 2019 novel coronavirus  (SARS-CoV-2)  nucleic acids may be present in the submitted sample  additional confirmatory testing may be necessary for epidemiological  and / or clinical management purposes  to differentiate between  SARS-CoV-2 and other Sarbecovirus currently known to infect humans.  If clinically indicated additional testing with an alternate  test  methodology [phone]) is advised. The SARS-CoV-2 RNA is generally  detectable in upper and lower respiratory sp ecimens during the acute  phase of infection. The expected result is Negative. Fact Sheet for Patients:  StrictlyIdeas.no Fact Sheet for Healthcare Providers: BankingDealers.co.za This test is not yet approved or cleared by the [place] FDA and has been authorized for detection and/or diagnosis of SARS-CoV-2 by FDA under an Emergency Use Authorization (EUA).  This EUA will remain in effect (meaning this test can be used) for the duration of the COVID-19 declaration under Section 564(b)(1) of the Act, 21 U.S.C. section 360bbb-3(b)(1), unless the authorization is terminated or revoked sooner. Performed at [facility], [address] [place], [place] [address]        Today   Subjective    Brandon Miller today has no new complaints- Eating and drinking well, having semisolid BMs, -No fever  Or chills  No nausea vomiting          Patient has been seen and examined prior to discharge   Objective   Blood pressure 108/63, pulse (!) 51, temperature 98.3 F (36.8 C), temperature source Oral, resp. rate 16, height 6\' 2"  (1.88 m), weight 90.7 kg, SpO2 97 %.   Intake/Output Summary (Last 24 hours) at 11/02/2018 1110 Last data filed at 11/01/2018 1743 Gross per 24 hour  Intake 979 ml  Output --  Net 979 ml    Exam Gen:- Awake Alert, no acute distress  HEENT:- [place].AT, No sclera icterus Neck-Supple Neck,No JVD,.  Lungs-  CTAB , good air movement bilaterally CV- S1, S2 normal, regular Abd-  +ve  B.Sounds, Abd Soft, No tenderness,    Extremity/Skin:- No  edema,   good pulses Psych-affect is appropriate, oriented x3 Neuro-no new focal deficits, no tremors    Data Review   CBC w Diff:  Lab Results  Component Value Date   WBC 5.8 10/30/2018   HGB 13.2 10/30/2018   HCT 39.7 10/30/2018   PLT 107 (L) 10/30/2018   LYMPHOPCT 43 10/30/2018   MONOPCT 10 10/30/2018   EOSPCT 2 10/30/2018   BASOPCT 1 10/30/2018    CMP:  Lab Results  Component Value Date   NA 140 11/01/2018   K 3.9 11/01/2018   CL 108 11/01/2018   CO2 25 11/01/2018   BUN 5 (L) 11/01/2018   CREATININE 0.94 11/01/2018   PROT 5.6 (L) 11/01/2018   ALBUMIN 2.9 (L) 11/01/2018   BILITOT 2.0 (H) 11/01/2018   ALKPHOS 68 11/01/2018   AST 76 (H) 11/01/2018   ALT 43 11/01/2018  .   Total Discharge time is about 33 minutes  Roxan Hockey M.D on 11/02/2018 at 11:10 AM  Go to [email or web address] -  for contact info  [facility] - Office  [phone]

## 2018-11-05 ENCOUNTER — Encounter: Payer: Self-pay | Admitting: Nurse Practitioner

## 2018-11-05 ENCOUNTER — Other Ambulatory Visit: Payer: Self-pay

## 2018-11-05 ENCOUNTER — Ambulatory Visit (INDEPENDENT_AMBULATORY_CARE_PROVIDER_SITE_OTHER): Payer: Self-pay | Admitting: Nurse Practitioner

## 2018-11-05 VITALS — BP 116/66 | HR 68 | Temp 96.9°F | Ht 75.0 in | Wt 202.4 lb

## 2018-11-05 DIAGNOSIS — R7989 Other specified abnormal findings of blood chemistry: Secondary | ICD-10-CM

## 2018-11-05 DIAGNOSIS — R197 Diarrhea, unspecified: Secondary | ICD-10-CM

## 2018-11-05 DIAGNOSIS — R1084 Generalized abdominal pain: Secondary | ICD-10-CM

## 2018-11-05 NOTE — Patient Instructions (Signed)
Your health issues we discussed today were:   Persistent abdominal discomfort with continued soft stools: 1. As discussed, we will schedule an upper endoscopy and colonoscopy for you. 2. Start taking a probiotic (over-the-counter) for the next 2 to 3 months.  Some brands we have previously used or recommended include Align, Relistor, and [employer].  You can further discuss with the pharmacist any other recommendations 3. Call us if you have any worsening or severe symptoms  Elevated liver enzymes: 1. As we discussed, please abstain from drinking alcohol while we are evaluating your liver enzymes 2. I will recheck your labs in about 1 month 3. We will call you when we receive the results.  Overall I recommend:  1. Continue your other current medications 2. Follow-up in 3 months 3. Call us if you have any questions or concerns.   Because of recent events of COVID-19 ("Coronavirus"), follow CDC recommendations:  Wash your hand frequently Avoid touching your face Stay away from people who are sick If you have symptoms such as fever, cough, shortness of breath then call your healthcare provider for further guidance If you are sick, STAY AT HOME unless otherwise directed by your healthcare provider. Follow directions from state and national officials regarding staying safe   At [facility] we value your feedback. You may receive a survey about your visit today. Please share your experience as we strive to create trusting relationships with our patients to provide genuine, compassionate, quality care.  We appreciate your understanding and patience as we review any laboratory studies, imaging, and other diagnostic tests that are ordered as we care for you. Our office policy is 5 business days for review of these results, and any emergent or urgent results are addressed in a timely manner for your best interest. If you do not hear from our office in 1 week, please  contact us.   We also encourage the use of MyChart, which contains your medical information for your review as well. If you are not enrolled in this feature, an access code is on this after visit summary for your convenience. Thank you for allowing [place] to be involved in your care.  It was great to see you today!  I hope you have a great Fall!!

## 2018-11-05 NOTE — Progress Notes (Signed)
Primary Care Physician:  Celene Squibb, MD Primary Gastroenterologist:  Dr. Gala Romney  Chief Complaint  Patient presents with  . Abdominal Pain    ref by er doc  . Diarrhea    HPI:   Brandon Miller is a 53 y.o. male who presents on referral from the emergency department for abdominal pain.  The patient had been seen in the emergency department 10/11/2018 for colitis, 10/22/2018 for generalized abdominal pain, and was subsequently admitted from 10/29/2018 through 11/02/2018 for abdominal pain and diarrhea of presumed infectious etiology.  Initially presented to the ER with nausea, vomiting, diarrhea for about a month with 8-10 loose stools daily.  Single episode of emesis.  On Cipro and Flagyl without results.  Recent ER visit with discharge home with GI follow-up.  Most of his labs were essentially unremarkable.  He did have a mild elevated AST/ALT of 85/52 with alkaline phosphatase at 167.    CT of the abdomen with contrast 10/29/2018 showed nonspecific focally dilated loop of ileum within the right mid abdomen which appears new from prior exam with a gradual area of tapering in the distal ileal loops.  This is felt to be possibly representative of a focal ileus or partial small bowel obstruction.  Also colonic diverticuli and mild perirectal wall thickening.  Of note he had no fever or white blood cell count.  Felt likely persistent gastroenteritis.  Emesis resolved and abdominal pain improved significantly.  Unable to obtain stool sample for C. difficile or GI path panel as diarrhea resolved.  Plan for EGD and colonoscopy as outpatient.  Last GI consult progress note dated 11/01/2018 recommended follow-up on LFTs, early interval office follow-up in October, anticipate need for EGD and colonoscopy as an outpatient in the near future.  Today he states he's doing somewhat better. Has never had a colonoscopy before. Abdominal pain is present, sore, but improved. He has some soft stool (consistent with  Bristol 5) but no further loose/diarrhea. Denies hematochezia. Abdominal pain is generalized and currently 5/10. Last ETOH was about a week ago (before hospitalization). Denies N/V, melena, fever, chills, unintentional weight loss. Denies URI or flu-like symptoms. Denies loss of sense of taste or smell. Denies chest pain, dyspnea, dizziness, lightheadedness, syncope, near syncope. Denies any other upper or lower GI symptoms.  Past Medical History:  Diagnosis Date  . Allergy   . Anemia   . Anxiety   . Arthritis    RA  . Complication of anesthesia    hard to wake up-was told he may have sleep apnea-never tested  . Depression   . Diabetes mellitus without complication ([place])   . Sleep apnea   . Snores     Past Surgical History:  Procedure Laterality Date  . ELBOW SURGERY Right 2010   Dr. Percell Miller  . HERNIA REPAIR     Right inguinal  . KNEE ARTHROSCOPY WITH MEDIAL MENISECTOMY Right 06/11/2013   Procedure: RIGHT KNEE ARTHROSCOPY WITH MEDIAL MENISECTOMY AND CHONDROPLASTY;  Surgeon: Ninetta Lights, MD;  Location: [facility];  Service: Orthopedics;  Laterality: Right;  Chondroplasty, loose bodies, medial menisectomy, lysis of adhesions.  Marland Kitchen KNEE SURGERY Right 2011  . right ankle    . SHOULDER SURGERY Right 2010   Dr. Percell Miller  . TOTAL KNEE ARTHROPLASTY Right 08/25/2014   Procedure: TOTAL KNEE ARTHROPLASTY;  Surgeon: Ninetta Lights, MD;  Location: [facility];  Service: Orthopedics;  Laterality: Right;    Current Outpatient Medications  Medication Sig Dispense Refill  .  acetaminophen (TYLENOL) 325 MG tablet Take 650 mg by mouth every 6 (six) hours as needed.    . ADDERALL XR 25 MG 24 hr capsule Take 25 mg by mouth daily.    Marland Kitchen buPROPion (WELLBUTRIN XL) 150 MG 24 hr tablet Take 150 mg by mouth daily.    . calcium-vitamin D 250-100 MG-UNIT tablet Take 1 tablet by mouth 2 (two) times daily. 60 tablet 0  . escitalopram (LEXAPRO) 10 MG tablet Take 10 mg by mouth daily.    . folic acid  (FOLVITE) 1 MG tablet Take 1 tablet (1 mg total) by mouth daily. 30 tablet 3  . LORazepam (ATIVAN) 1 MG tablet Take 1 mg by mouth 2 (two) times daily as needed for anxiety.    . pantoprazole (PROTONIX) 40 MG tablet Take 1 tablet (40 mg total) by mouth 2 (two) times daily before a meal. 60 tablet 1  . thiamine 100 MG tablet Take 1 tablet (100 mg total) by mouth daily. 30 tablet 3  . traZODone (DESYREL) 150 MG tablet Take 1 tablet (150 mg total) by mouth at bedtime. (Patient taking differently: Take 150 mg by mouth at bedtime as needed. ) 90 tablet 1   No current facility-administered medications for this visit.     Allergies as of 11/05/2018 - Review Complete 11/05/2018  Allergen Reaction Noted  . Daypro [oxaprozin] Other (See Comments) 08/25/2014  . Diclofenac sodium Other (See Comments) 02/18/2015  . Indocin [indomethacin] Nausea And Vomiting 10/29/2011  . Zanaflex [tizanidine hcl] Swelling 10/29/2011    Family History  Problem Relation Age of Onset  . Hepatitis Mother   . Cirrhosis Mother        Did not drink  . Pneumonia Father   . Kidney failure Father   . Gastric cancer Brother        ? Gastric CA spread to esophagus  . Colon cancer Neg Hx     Social History   Socioeconomic History  . Marital status: Divorced    Spouse name: Not on file  . Number of children: Not on file  . Years of education: Not on file  . Highest education level: Not on file  Occupational History  . Not on file  Social Needs  . Financial resource strain: Not on file  . Food insecurity    Worry: Not on file    Inability: Not on file  . Transportation needs    Medical: Not on file    Non-medical: Not on file  Tobacco Use  . Smoking status: Current Every Day Smoker    Packs/day: 0.50    Years: 25.00    Pack years: 12.50    Types: Cigarettes  . Smokeless tobacco: Never Used  Substance and Sexual Activity  . Alcohol use: Yes    Comment: Last drink 10/28/2018; previously  2-3x a week.  . Drug  use: No  . Sexual activity: Not Currently  Lifestyle  . Physical activity    Days per week: Not on file    Minutes per session: Not on file  . Stress: Not on file  Relationships  . Social Herbalist on phone: Not on file    Gets together: Not on file    Attends religious service: Not on file    Active member of club or organization: Not on file    Attends meetings of clubs or organizations: Not on file    Relationship status: Not on file  . Intimate partner violence  Fear of current or ex partner: Not on file    Emotionally abused: Not on file    Physically abused: Not on file    Forced sexual activity: Not on file  Other Topics Concern  . Not on file  Social History Narrative  . Not on file    Review of Systems: General: Negative for anorexia, weight loss, fever, chills, fatigue, weakness. Eyes: Negative for vision changes.  ENT: Negative for hoarseness, difficulty swallowing , nasal congestion. CV: Negative for chest pain, angina, palpitations, dyspnea on exertion, peripheral edema.  Respiratory: Negative for dyspnea at rest, dyspnea on exertion, cough, sputum, wheezing.  GI: See history of present illness. GU:  Negative for dysuria, hematuria, urinary incontinence, urinary frequency, nocturnal urination.  MS: Negative for joint pain, low back pain.  Derm: Negative for rash or itching.  Neuro: Negative for weakness, abnormal sensation, seizure, frequent headaches, memory loss, confusion.  Psych: Negative for anxiety, depression, suicidal ideation, hallucinations.  Endo: Negative for unusual weight change.  Heme: Negative for bruising or bleeding. Allergy: Negative for rash or hives.    Physical Exam: BP 116/66   Pulse 68   Temp (!) 96.9 F (36.1 C)   Ht 6\' 3"  (1.905 m)   Wt 202 lb 6.4 oz (91.8 kg)   BMI 25.30 kg/m  General:   Alert and oriented. Pleasant and cooperative. Well-nourished and well-developed.  Head:  Normocephalic and atraumatic. Eyes:   Without icterus, sclera clear and conjunctiva pink.  Ears:  Normal auditory acuity. Mouth:  No deformity or lesions, oral mucosa pink.  Throat/Neck:  Supple, without mass or thyromegaly. Cardiovascular:  S1, S2 present without murmurs appreciated. Normal pulses noted. Extremities without clubbing or edema. Respiratory:  Clear to auscultation bilaterally. No wheezes, rales, or rhonchi. No distress.  Gastrointestinal:  +BS, soft, non-tender and non-distended. No HSM noted. No guarding or rebound. No masses appreciated.  Rectal:  Deferred  Musculoskalatal:  Symmetrical without gross deformities. Normal posture. Skin:  Intact without significant lesions or rashes. Neurologic:  Alert and oriented x4;  grossly normal neurologically. Psych:  Alert and cooperative. Normal mood and affect. Heme/Lymph/Immune: No significant cervical adenopathy. No excessive bruising noted.    11/05/2018 11:19 AM   Disclaimer: This note was dictated with voice recognition software. Similar sounding words can inadvertently be transcribed and may not be corrected upon review.

## 2018-11-10 NOTE — Assessment & Plan Note (Signed)
Presented to the emergency department complaits of nausea, vomiting, diarrhea for about a month with 8-10 stools a day.  Today he states his diarrhea has resolved.  He is having soft stools consistently Bristol 5.  Recommended continue to monitor.  We will plan to do a colonoscopy for further evaluation as well.  Return for follow-up in 3 months.

## 2018-11-10 NOTE — Assessment & Plan Note (Signed)
In the emergency department patient was having abdominal pain.  CT updated 10/29/2018 with a nonspecific focally dilated loop of ileum within the right midabdomen she appears to be new from prior exam with a gradual area of tapering in the distal ileal loops possibly representative of appropriate ileus or partial small bowel obstruction.  Also colonic diverticuli and mild perirectal wall thickening is noted.  His ER symptoms are felt likely persistent gastroenteritis.  Unable to complete stool sample procedure or GI path panel.  Plan for EGD and colonoscopy as outpatient per most recent consult note in the hospital.  At this point recommend continue probiotics for 2 to 3 months for possible gastroenteritis.  Recheck labs as per above.  We will proceed with colonoscopy and endoscopy per previous recommendations.  Follow-up in 3 months.  Proceed with TCS and EGD on propofol/MAC with Dr. Gala Romney in near future: the risks, benefits, and alternatives have been discussed with the patient in detail. The patient states understanding and desires to proceed.  The patient is currently on Adderall, Wellbutrin, Lexapro, Ativan, trazodone.  No other anticoagulants, anxiolytics, chronic pain medications, antidepressants, antidiabetics, or iron supplements.  We will plan for the procedure on propofol/MAC to promote adequate sedation.

## 2018-11-10 NOTE — Assessment & Plan Note (Signed)
Mild elevation of LFTs on recent ER visit with AST/ALT 85/52 and alkaline phosphatase mildly elevated at 167.  No overt liver findings on CT of the abdomen.  Previous consult note recommended follow-up on LFTs.  At this point I will recheck BMP, HFP in 1 month.  Recent labs per ER visit.  Further recommendations pending trend in LFTs.

## 2018-11-11 NOTE — Progress Notes (Signed)
CC'ED TO PCP 

## 2018-11-17 ENCOUNTER — Ambulatory Visit: Payer: Self-pay | Attending: Internal Medicine | Admitting: Internal Medicine

## 2018-11-17 ENCOUNTER — Encounter: Payer: Self-pay | Admitting: Internal Medicine

## 2018-11-17 ENCOUNTER — Ambulatory Visit (HOSPITAL_BASED_OUTPATIENT_CLINIC_OR_DEPARTMENT_OTHER): Payer: Self-pay | Admitting: Pharmacist

## 2018-11-17 ENCOUNTER — Ambulatory Visit: Payer: Self-pay | Attending: Family Medicine | Admitting: Licensed Clinical Social Worker

## 2018-11-17 ENCOUNTER — Other Ambulatory Visit: Payer: Self-pay

## 2018-11-17 VITALS — BP 114/70 | HR 55 | Temp 98.3°F | Resp 16 | Ht 75.0 in | Wt 205.2 lb

## 2018-11-17 DIAGNOSIS — G4733 Obstructive sleep apnea (adult) (pediatric): Secondary | ICD-10-CM

## 2018-11-17 DIAGNOSIS — K529 Noninfective gastroenteritis and colitis, unspecified: Secondary | ICD-10-CM

## 2018-11-17 DIAGNOSIS — F329 Major depressive disorder, single episode, unspecified: Secondary | ICD-10-CM

## 2018-11-17 DIAGNOSIS — F331 Major depressive disorder, recurrent, moderate: Secondary | ICD-10-CM

## 2018-11-17 DIAGNOSIS — F32A Depression, unspecified: Secondary | ICD-10-CM

## 2018-11-17 DIAGNOSIS — Z23 Encounter for immunization: Secondary | ICD-10-CM

## 2018-11-17 DIAGNOSIS — Z09 Encounter for follow-up examination after completed treatment for conditions other than malignant neoplasm: Secondary | ICD-10-CM

## 2018-11-17 DIAGNOSIS — F17200 Nicotine dependence, unspecified, uncomplicated: Secondary | ICD-10-CM

## 2018-11-17 DIAGNOSIS — Z599 Problem related to housing and economic circumstances, unspecified: Secondary | ICD-10-CM

## 2018-11-17 DIAGNOSIS — F909 Attention-deficit hyperactivity disorder, unspecified type: Secondary | ICD-10-CM

## 2018-11-17 DIAGNOSIS — F419 Anxiety disorder, unspecified: Secondary | ICD-10-CM

## 2018-11-17 DIAGNOSIS — Z8639 Personal history of other endocrine, nutritional and metabolic disease: Secondary | ICD-10-CM

## 2018-11-17 DIAGNOSIS — Z2821 Immunization not carried out because of patient refusal: Secondary | ICD-10-CM

## 2018-11-17 DIAGNOSIS — Z598 Other problems related to housing and economic circumstances: Secondary | ICD-10-CM

## 2018-11-17 DIAGNOSIS — E119 Type 2 diabetes mellitus without complications: Secondary | ICD-10-CM

## 2018-11-17 DIAGNOSIS — F1020 Alcohol dependence, uncomplicated: Secondary | ICD-10-CM

## 2018-11-17 LAB — POCT GLYCOSYLATED HEMOGLOBIN (HGB A1C): HbA1c POC (<> result, manual entry): 4.7 % (ref 4.0–5.6)

## 2018-11-17 NOTE — Progress Notes (Signed)
Patient ID: Brandon Miller, male    DOB: [birth year]  MRN: [id number]  CC: Hospitalization Follow-up   Subjective: Brandon Miller is a 53 y.o. male who presents for new pt visit and hosp f/u His concerns today include:  Patient with history of DM type II (diet control), tob dep, OSA did not tolerate CPAP (several yrs ago), EtOH abuse, depression/anxiety, ADHD, RA  Previous PCP was Dr. Allyn Kenner.  Last seen about 2 wks ago. Decided to change because he did not care for him and pt lacks insurance.  He would like to get the orange card/cone discount and was told that he would probably get it much quicker through our clinic.  Patient hospitalized 10/7-11/2018 with lower abdominal pain, vomiting/diarrhea times times several weeks.  He was seen in the emergency room twice prior to this admission with the same complaint.  CAT scan of the abdomen on initial ER visit revealed circumferential thickening of the rectum and distal sigmoid colon suggesting nonspecific colitis.  He had another CAT scan about 10 days later in the ER showing similar findings.  Patient was placed on Cipro and Flagyl.  He returned to the hospital on 10/29/2018.  CAT scan this time revealed nonspecific focally dilated loop of ileum within the right midabdomen which appeared new from prior imaging and gradual area of tapering in the distal ileal loops.  Again thickening of the rectal wall was seen. -During hospital stools were not sent for C. difficile or other cultures as diarrhea resolved.  GI was consulted.  Recommended follow-up as an outpatient for EGD and colonoscopy.  General surgery was also consulted.  No surgical intervention needed at the time.  He was noted to have elevated liver enzymes that decreased during hospitalization.   - Since hospitalization he has seen GI.  Plan is for EGD and colonoscopy.  He was advised to continued use of probiotics for 2 to 3 months.  He tells me the procedure is scheduled for January.  Diarrhea  has resolved.  Still gets some pain at times in the left side of the abdomen. He has not had any fevers.  Tobacco dependence: Smokes half a pack of cigarettes a day on and off for 30 years.  Never quit completely.  Not ready to quit.  EtOH use disorder: Used to drink a 12 pack a day.  Since hospitalization he states he is only drink 4-5 beers since hospital discharge.  He denies significant cravings.  I asked about his history of rheumatoid arthritis.  Patient tells me he was diagnosed in early 2000.  He was seeing a rheumatologist but has not seen one in the past 4 years due to lack of insurance.  He was on Humira.  He reports no significant joint aches in the extremities but some pain in the lower back at times.  History of depression/anxiety.  Patient on Lexapro that he reports was recently increased to 20 mg by Dr. Berdine Addison, Wellbutrin, Ativan.  Ativan was prescribed through his primary doctor.  He is also on Adderall which he tells me he has been on for several years for ADHD.  He is not plugged into mental health provider.  Had psychiatric admission in July of this year with suicidal ideation.  Past medical, social, family, surgical history reviewed.  Patient Active Problem List   Diagnosis Date Noted  . Elevated LFTs 11/05/2018  . Acute gastroenteritis 10/31/2018  . Sleep apnea   . Generalized abdominal pain   . Non-intractable vomiting   .  Diarrhea   . ETOH abuse   . Ileus, unspecified ([place]) 10/29/2018  . Severe recurrent major depression without psychotic features ([place]) 08/15/2018  . Alcohol withdrawal seizure with perceptual disturbance ([place])   . Alcohol abuse with unspecified alcohol-induced disorder ([place]) 02/05/2018  . Transaminasemia 02/05/2018  . Tobacco abuse 02/05/2018  . Depression   . Alcohol withdrawal seizure without complication ([place]) 123456  . DJD (degenerative joint disease) of knee 08/25/2014  . Diabetes mellitus, type 2 ([place]) 10/31/2011  . Hyperglycemia  10/31/2011  . PNA (pneumonia) 10/30/2011  . LEG PAIN, RIGHT 01/20/2010     Current Outpatient Medications on File Prior to Visit  Medication Sig Dispense Refill  . acetaminophen (TYLENOL) 325 MG tablet Take 650 mg by mouth every 6 (six) hours as needed.    . ADDERALL XR 25 MG 24 hr capsule Take 25 mg by mouth daily.    Marland Kitchen buPROPion (WELLBUTRIN XL) 150 MG 24 hr tablet Take 150 mg by mouth daily.    . calcium-vitamin D 250-100 MG-UNIT tablet Take 1 tablet by mouth 2 (two) times daily. 60 tablet 0  . escitalopram (LEXAPRO) 10 MG tablet Take 10 mg by mouth daily.    . folic acid (FOLVITE) 1 MG tablet Take 1 tablet (1 mg total) by mouth daily. 30 tablet 3  . LORazepam (ATIVAN) 1 MG tablet Take 1 mg by mouth 2 (two) times daily as needed for anxiety.    . pantoprazole (PROTONIX) 40 MG tablet Take 1 tablet (40 mg total) by mouth 2 (two) times daily before a meal. 60 tablet 1  . thiamine 100 MG tablet Take 1 tablet (100 mg total) by mouth daily. 30 tablet 3  . traZODone (DESYREL) 150 MG tablet Take 1 tablet (150 mg total) by mouth at bedtime. (Patient taking differently: Take 150 mg by mouth at bedtime as needed. ) 90 tablet 1   No current facility-administered medications on file prior to visit.     Allergies  Allergen Reactions  . Daypro [Oxaprozin] Other (See Comments)    'tears his stomach up"  . Diclofenac Sodium Other (See Comments)    dizzy  . Indocin [Indomethacin] Nausea And Vomiting  . Zanaflex [Tizanidine Hcl] Swelling    Social History   Socioeconomic History  . Marital status: Divorced    Spouse name: Not on file  . Number of children: Not on file  . Years of education: Not on file  . Highest education level: Not on file  Occupational History  . Occupation: unemployed  Social Needs  . Financial resource strain: Not on file  . Food insecurity    Worry: Not on file    Inability: Not on file  . Transportation needs    Medical: Not on file    Non-medical: Not on file   Tobacco Use  . Smoking status: Current Every Day Smoker    Packs/day: 0.50    Years: 25.00    Pack years: 12.50    Types: Cigarettes  . Smokeless tobacco: Never Used  Substance and Sexual Activity  . Alcohol use: Yes    Comment: Last drink 10/28/2018; previously  2-3x a week.  . Drug use: No  . Sexual activity: Not Currently  Lifestyle  . Physical activity    Days per week: Not on file    Minutes per session: Not on file  . Stress: Not on file  Relationships  . Social connections    Talks on phone: Not on file  Gets together: Not on file    Attends religious service: Not on file    Active member of club or organization: Not on file    Attends meetings of clubs or organizations: Not on file    Relationship status: Not on file  . Intimate partner violence    Fear of current or ex partner: Not on file    Emotionally abused: Not on file    Physically abused: Not on file    Forced sexual activity: Not on file  Other Topics Concern  . Not on file  Social History Narrative  . Not on file    Family History  Problem Relation Age of Onset  . Hepatitis Mother   . Cirrhosis Mother        Did not drink  . Pneumonia Father   . Kidney failure Father   . Gastric cancer Brother        ? Gastric CA spread to esophagus  . Colon cancer Neg Hx     Past Surgical History:  Procedure Laterality Date  . ELBOW SURGERY Right 2010   Dr. Percell Miller  . HERNIA REPAIR     Right inguinal  . KNEE ARTHROSCOPY WITH MEDIAL MENISECTOMY Right 06/11/2013   Procedure: RIGHT KNEE ARTHROSCOPY WITH MEDIAL MENISECTOMY AND CHONDROPLASTY;  Surgeon: Ninetta Lights, MD;  Location: [facility];  Service: Orthopedics;  Laterality: Right;  Chondroplasty, loose bodies, medial menisectomy, lysis of adhesions.  Marland Kitchen KNEE SURGERY Right 2011  . right ankle    . SHOULDER SURGERY Right 2010   Dr. Percell Miller  . TOTAL KNEE ARTHROPLASTY Right 08/25/2014   Procedure: TOTAL KNEE ARTHROPLASTY;  Surgeon: Ninetta Lights, MD;  Location: [facility];  Service: Orthopedics;  Laterality: Right;    ROS: Review of Systems  Endocrine:       History of diabetes.  However he lost up to 85 pounds in 2017 and at that point was able to be taken off medications.  A1c today is 4.7.  Neurological:       History of obstructive sleep apnea that was diagnosed several years ago.  He tells me that he was never able to tolerate CPAP.  He feels he no longer has sleep apnea given his weight loss.   Negative except as stated above  PHYSICAL EXAM: BP 114/70   Pulse (!) 55   Temp 98.3 F (36.8 C) (Oral)   Resp 16   Ht 6\' 3"  (1.905 m)   Wt 205 lb 3.2 oz (93.1 kg)   SpO2 96%   BMI 25.65 kg/m   Physical Exam  General appearance - alert, well appearing, middle-aged Caucasian male in NAD and in no distress Mental status - normal mood, behavior, speech, dress, motor activity, and thought processes Mouth - mucous membranes moist, pharynx normal without lesions Neck - supple, no significant adenopathy Chest - clear to auscultation, no wheezes, rales or rhonchi, symmetric air entry Heart - normal rate, regular rhythm, normal S1, S2, no murmurs, rubs, clicks or gallops Abdomen - soft, nontender, nondistended, no masses or organomegaly Extremities - peripheral pulses normal, no pedal edema, no clubbing or cyanosis MSK: No signs of acute inflammation or joint enlargement in the hands or wrists CMP Latest Ref Rng & Units 11/01/2018 10/30/2018 10/29/2018  Glucose 70 - 99 mg/dL 72 75 84  BUN 6 - 20 mg/dL 5(L) 19 20  Creatinine 0.61 - 1.24 mg/dL 0.94 1.28(H) 1.19  Sodium 135 - 145 mmol/L 140 137 135  Potassium 3.5 - 5.1 mmol/L 3.9 4.1 4.1  Chloride 98 - 111 mmol/L 108 106 105  CO2 22 - 32 mmol/L 25 25 23   Calcium 8.9 - 10.3 mg/dL 8.2(L) 7.4(L) 7.9(L)  Total Protein 6.5 - 8.1 g/dL 5.6(L) 5.5(L) 6.3(L)  Total Bilirubin 0.3 - 1.2 mg/dL 2.0(H) 1.6(H) 0.6  Alkaline Phos 38 - 126 U/L 68 110 167(H)  AST 15 - 41 U/L 76(H) 62(H) 85(H)   ALT 0 - 44 U/L 43 42 52(H)   Lipid Panel  No results found for: CHOL, TRIG, HDL, CHOLHDL, VLDL, LDLCALC, LDLDIRECT  CBC    Component Value Date/Time   WBC 5.8 10/30/2018 0640   RBC 4.17 (L) 10/30/2018 0640   HGB 13.2 10/30/2018 0640   HCT 39.7 10/30/2018 0640   PLT 107 (L) 10/30/2018 0640   MCV 95.2 10/30/2018 0640   MCH 31.7 10/30/2018 0640   MCHC 33.2 10/30/2018 0640   RDW 13.9 10/30/2018 0640   LYMPHSABS 2.5 10/30/2018 0640   MONOABS 0.6 10/30/2018 0640   EOSABS 0.1 10/30/2018 0640   BASOSABS 0.0 10/30/2018 0640    ASSESSMENT AND PLAN: 1. Hospital discharge follow-up 2. Colitis Patient doing better since hospital discharge.  He is seen the gastroenterology PA and there is plan to do EGD/colonoscopy.  He will need to apply for the orange card/cone discount.  I will have my CMA give him those forms today.  3. Anxiety and depression 4. Attention deficit hyperactivity disorder (ADHD), unspecified ADHD type Encourage him to get established with a psychiatrist as soon as possible as I do not prescribe Ativan long-term for anxiety and also do not prescribe Adderall. Patient also at high risk for being on benzodiazepine given history of EtOH abuse -Our LCSW will see him today to inform him of resources in the area  5. Tobacco dependence Advised to quit.  Discussed health risks associated with smoking.  Patient not ready to give a trial of quitting.  6. Alcohol use disorder, moderate, dependence ([place]) Commended him on cutting back.  Encouraged him to remain free of alcohol.  I went over his LFTs with him.  They have been trending down.  7. OSA (obstructive sleep apnea) We can consider doing a sleep study once he is approved for the orange card/cone discount provided he is willing to give a trial of CPAP if he still has sleep apnea  8. History of diet-controlled diabetes He has achieved a cure due to weight loss.  Encourage healthy eating habits and regular exercise - POCT  glycosylated hemoglobin (Hb A1C) - Microalbumin / creatinine urine ratio  9. Need for influenza vaccination Given  10. 23-polyvalent pneumococcal polysaccharide vaccine declined Given   Patient was given the opportunity to ask questions.  Patient verbalized understanding of the plan and was able to repeat key elements of the plan.   Orders Placed This Encounter  Procedures  . Microalbumin / creatinine urine ratio  . POCT glycosylated hemoglobin (Hb A1C)     Requested Prescriptions    No prescriptions requested or ordered in this encounter    Return in about 2 months (around 01/17/2019).  Karle Plumber, MD, FACP

## 2018-11-17 NOTE — Progress Notes (Signed)
a1c-4.7  Pt states he is having pain on his left side

## 2018-11-17 NOTE — Progress Notes (Signed)
Patient presents for vaccination against influenza and s. pneumo  per orders of Dr. Wynetta Emery. Consent given. Counseling provided. No contraindications exists. Vaccine administered without incident.

## 2018-11-17 NOTE — Patient Instructions (Addendum)
Please try to establish care with a mental health provider as soon as possible as I do not prescribe Adderall and Ativan.  I encourage you to try to abstain from drinking any and all alcoholic beverages.  Your liver enzymes are improving.  Please complete the form for the orange card/cone discount card as soon as possible.  Influenza Virus Vaccine injection (Fluarix) What is this medicine? INFLUENZA VIRUS VACCINE (in floo EN zuh VAHY ruhs vak SEEN) helps to reduce the risk of getting influenza also known as the flu. This medicine may be used for other purposes; ask your health care provider or pharmacist if you have questions. COMMON BRAND NAME(S): Fluarix, Fluzone What should I tell my health care provider before I take this medicine? They need to know if you have any of these conditions:  bleeding disorder like hemophilia  fever or infection  Guillain-Barre syndrome or other neurological problems  immune system problems  infection with the human immunodeficiency virus (HIV) or AIDS  low blood platelet counts  multiple sclerosis  an unusual or allergic reaction to influenza virus vaccine, eggs, chicken proteins, latex, gentamicin, other medicines, foods, dyes or preservatives  pregnant or trying to get pregnant  breast-feeding How should I use this medicine? This vaccine is for injection into a muscle. It is given by a health care professional. A copy of Vaccine Information Statements will be given before each vaccination. Read this sheet carefully each time. The sheet may change frequently. Talk to your pediatrician regarding the use of this medicine in children. Special care may be needed. Overdosage: If you think you have taken too much of this medicine contact a poison control center or emergency room at once. NOTE: This medicine is only for you. Do not share this medicine with others. What if I miss a dose? This does not apply. What may interact with this  medicine?  chemotherapy or radiation therapy  medicines that lower your immune system like etanercept, anakinra, infliximab, and adalimumab  medicines that treat or prevent blood clots like warfarin  phenytoin  steroid medicines like prednisone or cortisone  theophylline  vaccines This list may not describe all possible interactions. Give your health care provider a list of all the medicines, herbs, non-prescription drugs, or dietary supplements you use. Also tell them if you smoke, drink alcohol, or use illegal drugs. Some items may interact with your medicine. What should I watch for while using this medicine? Report any side effects that do not go away within 3 days to your doctor or health care professional. Call your health care provider if any unusual symptoms occur within 6 weeks of receiving this vaccine. You may still catch the flu, but the illness is not usually as bad. You cannot get the flu from the vaccine. The vaccine will not protect against colds or other illnesses that may cause fever. The vaccine is needed every year. What side effects may I notice from receiving this medicine? Side effects that you should report to your doctor or health care professional as soon as possible:  allergic reactions like skin rash, itching or hives, swelling of the face, lips, or tongue Side effects that usually do not require medical attention (report to your doctor or health care professional if they continue or are bothersome):  fever  headache  muscle aches and pains  pain, tenderness, redness, or swelling at site where injected  weak or tired This list may not describe all possible side effects. Call your doctor for medical  advice about side effects. You may report side effects to FDA at 1-800-FDA-1088. Where should I keep my medicine? This vaccine is only given in a clinic, pharmacy, doctor's office, or other health care setting and will not be stored at home. NOTE: This sheet  is a summary. It may not cover all possible information. If you have questions about this medicine, talk to your doctor, pharmacist, or health care provider.  2020 Elsevier/Gold Standard (2007-08-06 09:30:40)

## 2018-11-18 LAB — MICROALBUMIN / CREATININE URINE RATIO
Creatinine, Urine: 82.8 mg/dL
Microalb/Creat Ratio: <4 mg/g creat (ref 0–29)
Microalbumin, Urine: <3 ug/mL

## 2018-11-20 NOTE — BH Specialist Note (Signed)
Integrated Behavioral Health Initial Visit  MRN: [id number] Name: Brandon Miller  Number of [facility] Clinician visits:: 1/6 Session Start time: 3:25 PM  Session End time: 3:45 PM Total time: 20  Type of Service: [place] Interpretor:No. Interpretor Name and Language: NA   Warm Hand Off Completed.       SUBJECTIVE: Brandon Miller is a 53 y.o. male accompanied by self Patient was referred by Dr. Wynetta Emery for depression, anxiety, and substance use. Patient reports the following symptoms/concerns: Pt reports difficulty managing depression and anxiety. States that he lost insurance resulting in change of PCP. Pt is concerned about medical bills without coverage Duration of problem: 2 months; Severity of problem: moderate  OBJECTIVE: Mood: Anxious and Affect: Appropriate Risk of harm to self or others: No plan to harm self or others Pt reports admission to [facility] July 2020 due to suicidal ideations. He is interested in establishing behavioral health services  LIFE CONTEXT: Family and Social: Pt receives support from sister and minimal support from brother School/Work: Pt is uninsured and does not have income. He is interested in applying for financial assistance through [facility] Self-Care: Pt reports significant decrease in alcohol use since recent discharge from hospital 2 weeks ago. Pt smokes cigarettes (.5ppd)  Life Changes: Pt reports difficulty managing depression and anxiety. States that he lost insurance resulting in change of PCP. Pt is concerned about medical bills without coverage  GOALS ADDRESSED: Patient will: 1. Reduce symptoms of: anxiety, depression and stress 2. Increase knowledge and/or ability of: coping skills and healthy habits  3. Demonstrate ability to: Increase healthy adjustment to current life circumstances, Increase adequate support systems for patient/family and Decrease self-medicating  behaviors  INTERVENTIONS: Interventions utilized: Supportive Counseling, Psychoeducation and/or Health Education and Link to [employer]  Standardized Assessments completed: GAD-7 and PHQ 2&9  ASSESSMENT: Patient currently experiencing difficulty managing depression and anxiety. States that he lost insurance resulting in change of PCP. Pt is concerned about medical bills without coverage.  Pt reports admission to [facility] July 2020 due to suicidal ideations. He is interested in establishing behavioral health services. Denies current SI/HI. Resources were provided.  Patient may benefit from medication management and psychotherapy. LCSW discussed correlation between one's physical and mental health. Strategies to assist in management and/or decrease in symptoms were discussed. LCSW informed pt of process for applying for CAFA and provided information on HOPE program to assist with past due utilities/rent.  PLAN: 1. Follow up with behavioral health clinician on : Contact pt with any additional behavioral health or resource needs 2. Behavioral recommendations: Comply with medication management, utilize strategies discussed and resources  3. Referral(s): Integrated Orthoptist (In Clinic) and [employer] Resources:  Finances and Housing 4. "From scale of 1-10, how likely are you to follow plan?":   Rebekah Chesterfield, LCSW 11/21/2018 2:29 PM

## 2018-11-25 ENCOUNTER — Other Ambulatory Visit: Payer: Self-pay

## 2018-11-25 DIAGNOSIS — R7989 Other specified abnormal findings of blood chemistry: Secondary | ICD-10-CM

## 2019-01-05 ENCOUNTER — Other Ambulatory Visit: Payer: Self-pay

## 2019-01-05 ENCOUNTER — Telehealth: Payer: Self-pay | Admitting: *Deleted

## 2019-01-05 DIAGNOSIS — R7989 Other specified abnormal findings of blood chemistry: Secondary | ICD-10-CM

## 2019-01-05 NOTE — Telephone Encounter (Signed)
LMOVM

## 2019-01-06 ENCOUNTER — Encounter: Payer: Self-pay | Admitting: *Deleted

## 2019-01-06 NOTE — Telephone Encounter (Signed)
Spoke with Brandon Miller in endo as we have had an urgent patient to be added to RMR schedule 02/12/2019. Patient can be moved down to 3:00pm on RMR schedule for this day. Spoke with patient and he was fine with this. Aware will mail new updated procedure times. He voiced understanding. Endo aware

## 2019-01-06 NOTE — Telephone Encounter (Signed)
LMOVM

## 2019-01-16 ENCOUNTER — Other Ambulatory Visit: Payer: Self-pay

## 2019-01-16 DIAGNOSIS — E119 Type 2 diabetes mellitus without complications: Secondary | ICD-10-CM | POA: Insufficient documentation

## 2019-01-16 DIAGNOSIS — F17210 Nicotine dependence, cigarettes, uncomplicated: Secondary | ICD-10-CM | POA: Insufficient documentation

## 2019-01-16 DIAGNOSIS — R1033 Periumbilical pain: Secondary | ICD-10-CM | POA: Insufficient documentation

## 2019-01-16 DIAGNOSIS — Z79899 Other long term (current) drug therapy: Secondary | ICD-10-CM | POA: Insufficient documentation

## 2019-01-17 ENCOUNTER — Emergency Department (HOSPITAL_COMMUNITY)
Admission: EM | Admit: 2019-01-17 | Discharge: 2019-01-17 | Disposition: A | Discharge status: Home / Self Care | Payer: Self-pay | Attending: Emergency Medicine | Admitting: Emergency Medicine

## 2019-01-17 ENCOUNTER — Emergency Department (HOSPITAL_COMMUNITY): Payer: Self-pay

## 2019-01-17 ENCOUNTER — Other Ambulatory Visit: Payer: Self-pay

## 2019-01-17 ENCOUNTER — Encounter (HOSPITAL_COMMUNITY): Payer: Self-pay

## 2019-01-17 DIAGNOSIS — R1033 Periumbilical pain: Secondary | ICD-10-CM

## 2019-01-17 LAB — CBC WITH DIFFERENTIAL/PLATELET
Abs Immature Granulocytes: 0.01 10*3/uL (ref 0.00–0.07)
Basophils Absolute: 0.1 10*3/uL (ref 0.0–0.1)
Basophils Relative: 1 %
Eosinophils Absolute: 0.1 10*3/uL (ref 0.0–0.5)
Eosinophils Relative: 2 %
HCT: 40.6 % (ref 39.0–52.0)
Hemoglobin: 14.4 g/dL (ref 13.0–17.0)
Immature Granulocytes: 0 %
Lymphocytes Relative: 53 %
Lymphs Abs: 2.7 10*3/uL (ref 0.7–4.0)
MCH: 33.1 pg (ref 26.0–34.0)
MCHC: 35.5 g/dL (ref 30.0–36.0)
MCV: 93.3 fL (ref 80.0–100.0)
Monocytes Absolute: 0.5 10*3/uL (ref 0.1–1.0)
Monocytes Relative: 9 %
Neutro Abs: 1.8 10*3/uL (ref 1.7–7.7)
Neutrophils Relative %: 35 %
Platelets: 151 10*3/uL (ref 150–400)
RBC: 4.35 MIL/uL (ref 4.22–5.81)
RDW: 12.6 % (ref 11.5–15.5)
WBC: 5.1 10*3/uL (ref 4.0–10.5)
nRBC: 0 % (ref 0.0–0.2)

## 2019-01-17 LAB — COMPREHENSIVE METABOLIC PANEL
ALT: 40 U/L (ref 0–44)
AST: 96 U/L — ABNORMAL HIGH (ref 15–41)
Albumin: 3.4 g/dL — ABNORMAL LOW (ref 3.5–5.0)
Alkaline Phosphatase: 114 U/L (ref 38–126)
Anion gap: 11 (ref 5–15)
BUN: 17 mg/dL (ref 6–20)
CO2: 22 mmol/L (ref 22–32)
Calcium: 8.2 mg/dL — ABNORMAL LOW (ref 8.9–10.3)
Chloride: 104 mmol/L (ref 98–111)
Creatinine, Ser: 1.35 mg/dL — ABNORMAL HIGH (ref 0.61–1.24)
GFR calc Af Amer: >60 mL/min (ref >60)
GFR calc non Af Amer: 60 mL/min — ABNORMAL LOW (ref >60)
Glucose, Bld: 92 mg/dL (ref 70–99)
Potassium: 3.6 mmol/L (ref 3.5–5.1)
Sodium: 137 mmol/L (ref 135–145)
Total Bilirubin: 0.6 mg/dL (ref 0.3–1.2)
Total Protein: 6.4 g/dL — ABNORMAL LOW (ref 6.5–8.1)

## 2019-01-17 LAB — LIPASE, BLOOD: Lipase: 40 U/L (ref 11–51)

## 2019-01-17 MED ORDER — SODIUM CHLORIDE 0.9 % IV SOLN: INTRAVENOUS | Status: DC

## 2019-01-17 MED ORDER — DICYCLOMINE HCL 20 MG PO TABS: 20.0000 mg | ORAL_TABLET | Freq: Two times a day (BID) | ORAL | 0 refills | Status: DC

## 2019-01-17 MED ORDER — ONDANSETRON HCL 4 MG/2ML IJ SOLN
4.0000 mg | Freq: Once | INTRAMUSCULAR | Status: AC
  Administered 2019-01-17: 4 mg via INTRAVENOUS
  Filled 2019-01-17: qty 2

## 2019-01-17 MED ORDER — FENTANYL CITRATE (PF) 100 MCG/2ML IJ SOLN
50.0000 ug | Freq: Once | INTRAMUSCULAR | Status: AC
  Administered 2019-01-17: 50 ug via INTRAVENOUS
  Filled 2019-01-17: qty 2

## 2019-01-17 MED ORDER — IOHEXOL 300 MG/ML  SOLN
100.0000 mL | Freq: Once | INTRAMUSCULAR | Status: AC | PRN
  Administered 2019-01-17: 100 mL via INTRAVENOUS

## 2019-01-17 MED ORDER — DICYCLOMINE HCL 10 MG/ML IM SOLN
20.0000 mg | Freq: Once | INTRAMUSCULAR | Status: AC
  Administered 2019-01-17: 20 mg via INTRAMUSCULAR
  Filled 2019-01-17: qty 2

## 2019-01-17 NOTE — ED Triage Notes (Signed)
Pt c/o severe left lower abd pain onset last night.  Pt denies vomiting or diarrhea.

## 2019-01-17 NOTE — Discharge Instructions (Signed)
As discussed, your evaluation today has been largely reassuring.  But, it is important that you monitor your condition carefully, and do not hesitate to return to the ED if you develop new, or concerning changes in your condition. ? ?Otherwise, please follow-up with your physician for appropriate ongoing care. ? ?

## 2019-01-17 NOTE — ED Provider Notes (Signed)
[facility] EMERGENCY DEPARTMENT Provider Note   CSN: [id number] Arrival date & time: 01/16/19  2347     History Chief Complaint  Patient presents with  . Abdominal Pain    Brandon Miller is a 53 y.o. male.  HPI    Patient with history of ileus, no abdominal surgery presents with new abdominal pain. Onset was yesterday, without clear precipitant. Now, over the past 36 hours or so he has had increasing pain in the left periumbilical area. Pain is essentially nonradiating, sore, severe. There is associated nausea, and today he had several episodes of vomiting. Last bowel movement was earlier today. No fever, no chest pain, no other complaints per No relief with anything, and symptoms are progressing. Past Medical History:  Diagnosis Date  . Allergy   . Anemia   . Anxiety   . Arthritis    RA  . Complication of anesthesia    hard to wake up-was told he may have sleep apnea-never tested  . Depression   . Diabetes mellitus without complication ([place])   . Sleep apnea   . Snores     Patient Active Problem List   Diagnosis Date Noted  . Attention deficit hyperactivity disorder (ADHD) 11/17/2018  . Tobacco dependence 11/17/2018  . Alcohol use disorder, moderate, dependence ([place]) 11/17/2018  . History of diet-controlled diabetes 11/17/2018  . Elevated LFTs 11/05/2018  . Acute gastroenteritis 10/31/2018  . Sleep apnea   . Generalized abdominal pain   . Non-intractable vomiting   . Diarrhea   . ETOH abuse   . Ileus, unspecified ([place]) 10/29/2018  . Severe recurrent major depression without psychotic features ([place]) 08/15/2018  . Alcohol withdrawal seizure with perceptual disturbance ([place])   . Alcohol abuse with unspecified alcohol-induced disorder ([place]) 02/05/2018  . Transaminasemia 02/05/2018  . Anxiety and depression   . Alcohol withdrawal seizure without complication ([place]) [id number]  . DJD (degenerative joint disease) of knee 08/25/2014  . Diabetes mellitus, type 2  ([place]) 10/31/2011  . Hyperglycemia 10/31/2011  . PNA (pneumonia) 10/30/2011  . LEG PAIN, RIGHT 01/20/2010    Past Surgical History:  Procedure Laterality Date  . ELBOW SURGERY Right 2010   Dr. Percell Miller  . HERNIA REPAIR     Right inguinal  . KNEE ARTHROSCOPY WITH MEDIAL MENISECTOMY Right 06/11/2013   Procedure: RIGHT KNEE ARTHROSCOPY WITH MEDIAL MENISECTOMY AND CHONDROPLASTY;  Surgeon: Ninetta Lights, MD;  Location: [place];  Service: Orthopedics;  Laterality: Right;  Chondroplasty, loose bodies, medial menisectomy, lysis of adhesions.  Marland Kitchen KNEE SURGERY Right 2011  . right ankle    . SHOULDER SURGERY Right 2010   Dr. Percell Miller  . TOTAL KNEE ARTHROPLASTY Right 08/25/2014   Procedure: TOTAL KNEE ARTHROPLASTY;  Surgeon: Ninetta Lights, MD;  Location: [place];  Service: Orthopedics;  Laterality: Right;       Family History  Problem Relation Age of Onset  . Hepatitis Mother   . Cirrhosis Mother        Did not drink  . Pneumonia Father   . Kidney failure Father   . Gastric cancer Brother        ? Gastric CA spread to esophagus  . Colon cancer Neg Hx     Social History   Tobacco Use  . Smoking status: Current Every Day Smoker    Packs/day: 0.50    Years: 25.00    Pack years: 12.50    Types: Cigarettes  . Smokeless tobacco: Never Used  Substance Use Topics  .  Alcohol use: Yes    Comment: Last drink 10/28/2018; previously  2-3x a week.  . Drug use: No    Home Medications Prior to Admission medications   Medication Sig Start Date End Date Taking? Authorizing Provider  acetaminophen (TYLENOL) 325 MG tablet Take 650 mg by mouth every 6 (six) hours as needed.    [provider]  ADDERALL XR 25 MG 24 hr capsule Take 25 mg by mouth daily. 09/13/18   [provider]  buPROPion (WELLBUTRIN XL) 150 MG 24 hr tablet Take 150 mg by mouth daily.    [provider]  calcium-vitamin D 250-100 MG-UNIT tablet Take 1 tablet by mouth 2 (two) times daily.  01/27/18   Ripley Fraise, MD  escitalopram (LEXAPRO) 10 MG tablet Take 10 mg by mouth daily.    [provider]  folic acid (FOLVITE) 1 MG tablet Take 1 tablet (1 mg total) by mouth daily. 11/02/18 11/02/19  Roxan Hockey, MD  LORazepam (ATIVAN) 1 MG tablet Take 1 mg by mouth 2 (two) times daily as needed for anxiety. 10/01/18   [provider]  pantoprazole (PROTONIX) 40 MG tablet Take 1 tablet (40 mg total) by mouth 2 (two) times daily before a meal. 11/02/18   Emokpae, Courage, MD  thiamine 100 MG tablet Take 1 tablet (100 mg total) by mouth daily. 11/02/18   Roxan Hockey, MD  traZODone (DESYREL) 150 MG tablet Take 1 tablet (150 mg total) by mouth at bedtime. Patient taking differently: Take 150 mg by mouth at bedtime as needed.  08/18/18   Johnn Hai, MD    Allergies    Daypro [oxaprozin], Diclofenac sodium, Indocin [indomethacin], and Zanaflex [tizanidine hcl]  Review of Systems   Review of Systems  Constitutional:       Per HPI, otherwise negative  HENT:       Per HPI, otherwise negative  Respiratory:       Per HPI, otherwise negative  Cardiovascular:       Per HPI, otherwise negative  Gastrointestinal: Positive for abdominal pain, nausea and vomiting.  Endocrine:       Negative aside from HPI  Genitourinary:       Neg aside from HPI   Musculoskeletal:       Per HPI, otherwise negative  Skin: Negative.   Neurological: Negative for syncope.    Physical Exam Updated Vital Signs BP 124/86 (BP Location: Right Arm)   Pulse 82   Temp 97.8 F (36.6 C) (Oral)   Resp (!) 21   SpO2 100%   Physical Exam Vitals and nursing note reviewed.  Constitutional:      General: He is not in acute distress.    Appearance: He is well-developed.     Comments: Uncomfortable appearing adult male awake and alert  HENT:     Head: Normocephalic and atraumatic.  Eyes:     Conjunctiva/sclera: Conjunctivae normal.  Cardiovascular:     Rate and Rhythm: Normal rate  and regular rhythm.  Pulmonary:     Effort: Pulmonary effort is normal. No respiratory distress.     Breath sounds: No stridor.  Abdominal:     General: There is no distension.     Tenderness: There is abdominal tenderness in the periumbilical area and left upper quadrant.  Skin:    General: Skin is warm and dry.  Neurological:     Mental Status: He is alert and oriented to person, place, and time.     ED Results / Procedures /  Treatments   Labs (all labs ordered are listed, but only abnormal results are displayed) Labs Reviewed  COMPREHENSIVE METABOLIC PANEL - Abnormal; Notable for the following components:      Result Value   Creatinine, Ser 1.35 (*)    Calcium 8.2 (*)    Total Protein 6.4 (*)    Albumin 3.4 (*)    AST 96 (*)    GFR calc non Af Amer 60 (*)    All other components within normal limits  LIPASE, BLOOD  CBC WITH DIFFERENTIAL/PLATELET  URINALYSIS, ROUTINE W REFLEX MICROSCOPIC    EKG None  Radiology CT ABDOMEN PELVIS W CONTRAST  Result Date: 01/17/2019 CLINICAL DATA:  Left periumbilical pain EXAM: CT ABDOMEN AND PELVIS WITH CONTRAST TECHNIQUE: Multidetector CT imaging of the abdomen and pelvis was performed using the standard protocol following bolus administration of intravenous contrast. CONTRAST:  140mL OMNIPAQUE IOHEXOL 300 MG/ML  SOLN COMPARISON:  October 29, 2018 FINDINGS: Lower chest: The visualized heart size within normal limits. No pericardial fluid/thickening. No hiatal hernia. The visualized portions of the lungs are clear. Hepatobiliary: The liver is normal in density without focal abnormality.The main portal vein is patent. The gallbladder is contracted. Pancreas: Unremarkable. No pancreatic ductal dilatation or surrounding inflammatory changes. Spleen: Normal in size without focal abnormality. Adrenals/Urinary Tract: Both adrenal glands appear normal. The kidneys and collecting system appear normal without evidence of urinary tract calculus or  hydronephrosis. Bladder is unremarkable. Stomach/Bowel: The stomach, small bowel, and colon are normal in appearance. Scattered colonic diverticula are noted without diverticulitis. No inflammatory changes, wall thickening, or obstructive findings.The appendix is normal. Vascular/Lymphatic: There are no enlarged mesenteric, retroperitoneal, or pelvic lymph nodes. Scattered aortic atherosclerotic calcifications are seen without aneurysmal dilatation. Reproductive: The prostate is unremarkable. Is Other: No evidence of abdominal wall mass or hernia. Musculoskeletal: No acute or significant osseous findings. IMPRESSION: No acute intra-abdominal or pelvic pathology to explain the patient's symptoms. Aortic Atherosclerosis (ICD10-I70.0). Diverticulosis without diverticulitis. Electronically Signed   By: Prudencio Pair M.D.   On: 01/17/2019 01:37    Procedures Procedures (including critical care time)  Medications Ordered in ED Medications  0.9 %  sodium chloride infusion ( Intravenous New Bag/Given 01/17/19 0100)  ondansetron (ZOFRAN) injection 4 mg (4 mg Intravenous Given 01/17/19 0059)  dicyclomine (BENTYL) injection 20 mg (20 mg Intramuscular Given 01/17/19 0059)  iohexol (OMNIPAQUE) 300 MG/ML solution 100 mL (100 mLs Intravenous Contrast Given 01/17/19 0122)  fentaNYL (SUBLIMAZE) injection 50 mcg (50 mcg Intravenous Given 01/17/19 0059)    ED Course  I have reviewed the triage vital signs and the nursing notes.  Pertinent labs & imaging results that were available during my care of the patient were reviewed by me and considered in my medical decision making (see chart for details).    MDM Rules/Calculators/A&P                      1:51 AM Patient substantially more calm on repeat exam.  Labs reviewed, some mild evidence for dehydration, generally reassuring, however. No elevation in lipase, no leukocytosis, and CT without demonstration of hernia, bowel obstruction, abscess or obvious  infection. Patient has scheduled GI follow-up within the month, and we discussed the importance of trying to expedite this, with consideration of colonoscopy promptly as well. With no peritonitis, generally unremarkable labs, no fever, no evidence of bacteremia, sepsis, acute abdominal processes, patient will start a course of Bentyl, which he has received in addition to fluids, antiemetics, analgesia,  and is appropriate for discharge with close outpatient follow-up. Final Clinical Impression(s) / ED Diagnoses Final diagnoses:  Periumbilical abdominal pain    Rx / DC Orders ED Discharge Orders    None       Carmin Muskrat, MD 01/17/19 [phone]

## 2019-01-30 ENCOUNTER — Ambulatory Visit: Payer: Self-pay | Attending: Internal Medicine | Admitting: Internal Medicine

## 2019-01-30 ENCOUNTER — Telehealth: Payer: Self-pay | Admitting: Internal Medicine

## 2019-01-30 ENCOUNTER — Other Ambulatory Visit: Payer: Self-pay

## 2019-01-30 DIAGNOSIS — R1032 Left lower quadrant pain: Secondary | ICD-10-CM

## 2019-01-30 NOTE — Telephone Encounter (Signed)
Called patient and LVM to return call and schedule his 2 month f/u with PCP.

## 2019-01-30 NOTE — Progress Notes (Signed)
Virtual Visit via Telephone Note Due to current restrictions/limitations of in-office visits due to the COVID-19 pandemic, this scheduled clinical appointment was converted to a telehealth visit  I connected with Brandon Miller on 01/30/19 at 3:00 p.m by telephone and verified that I am speaking with the correct person using two identifiers. I am in my office.  The patient is at home.  Only the patient and myself participated in this encounter.  I discussed the limitations, risks, security and privacy concerns of performing an evaluation and management service by telephone and the availability of in person appointments. I also discussed with the patient that there may be a patient responsible charge related to this service. The patient expressed understanding and agreed to proceed.   History of Present Illness: Patient with history of DM type II (diet control), tob dep, OSA did not tolerate CPAP (several yrs ago), EtOH abuse, depression/anxiety, ADHD, RA.  Last seen 10/2018.  Purpose of today's visit is ER f/u.   Colitis:  EGD/c-scope scheduled for 02/12/2019.  Seen in ER again 01/17/2019 for abdominal pain again.  Had another CT scan of abdmin/pelvis.  This revealed no acute findings and wall thickening seen in perirectal area on previous study was not seen.  Reports that he still has some mild soreness in the left side of the abdomen -Since last visit with me he has been approved for the orange card/cone discount  Observations/Objective:  Results for orders placed or performed during the hospital encounter of 01/17/19  Comprehensive metabolic panel  Result Value Ref Range   Sodium 137 135 - 145 mmol/L   Potassium 3.6 3.5 - 5.1 mmol/L   Chloride 104 98 - 111 mmol/L   CO2 22 22 - 32 mmol/L   Glucose, Bld 92 70 - 99 mg/dL   BUN 17 6 - 20 mg/dL   Creatinine, Ser 1.35 (H) 0.61 - 1.24 mg/dL   Calcium 8.2 (L) 8.9 - 10.3 mg/dL   Total Protein 6.4 (L) 6.5 - 8.1 g/dL   Albumin 3.4 (L) 3.5 - 5.0  g/dL   AST 96 (H) 15 - 41 U/L   ALT 40 0 - 44 U/L   Alkaline Phosphatase 114 38 - 126 U/L   Total Bilirubin 0.6 0.3 - 1.2 mg/dL   GFR calc non Af Amer 60 (L) >60 mL/min   GFR calc Af Amer >60 >60 mL/min   Anion gap 11 5 - 15  Lipase, blood  Result Value Ref Range   Lipase 40 11 - 51 U/L  CBC WITH DIFFERENTIAL  Result Value Ref Range   WBC 5.1 4.0 - 10.5 K/uL   RBC 4.35 4.22 - 5.81 MIL/uL   Hemoglobin 14.4 13.0 - 17.0 g/dL   HCT 40.6 39.0 - 52.0 %   MCV 93.3 80.0 - 100.0 fL   MCH 33.1 26.0 - 34.0 pg   MCHC 35.5 30.0 - 36.0 g/dL   RDW 12.6 11.5 - 15.5 %   Platelets 151 150 - 400 K/uL   nRBC 0.0 0.0 - 0.2 %   Neutrophils Relative % 35 %   Neutro Abs 1.8 1.7 - 7.7 K/uL   Lymphocytes Relative 53 %   Lymphs Abs 2.7 0.7 - 4.0 K/uL   Monocytes Relative 9 %   Monocytes Absolute 0.5 0.1 - 1.0 K/uL   Eosinophils Relative 2 %   Eosinophils Absolute 0.1 0.0 - 0.5 K/uL   Basophils Relative 1 %   Basophils Absolute 0.1 0.0 - 0.1 K/uL  Immature Granulocytes 0 %   Abs Immature Granulocytes 0.01 0.00 - 0.07 K/uL    Assessment and Plan: 1. Left lower quadrant abdominal pain -Patient with recent history of colitis in the fall of last year.  He was awaiting EGD and colonoscopy.  However in the meantime he had another bout of increased abdominal pain requiring ER visit the end of last month.  Repeat CAT scan at the time showed that findings that were seen in the colon on study done 10/2018 had resolved.  He has an appointment coming up with the gastroenterologist for EGD colonoscopy later this month. -I asked patient whether he wanted to move forward with scheduling a sleep study as we had plan to do this once he was approved for the orange card/cone discount.  Patient declined stating he wants to work on getting his GI symptoms sorted out before anything else.   Follow Up Instructions: F/u in 2 mths   I discussed the assessment and treatment plan with the patient. The patient was provided  an opportunity to ask questions and all were answered. The patient agreed with the plan and demonstrated an understanding of the instructions.   The patient was advised to call back or seek an in-person evaluation if the symptoms worsen or if the condition fails to improve as anticipated.  I provided 9 minutes of non-face-to-face time during this encounter.   Karle Plumber, MD

## 2019-02-03 LAB — BASIC METABOLIC PANEL
BUN: 14 mg/dL (ref 7–25)
CO2: 27 mmol/L (ref 20–32)
Calcium: 8.4 mg/dL — ABNORMAL LOW (ref 8.6–10.3)
Chloride: 101 mmol/L (ref 98–110)
Creat: 1.04 mg/dL (ref 0.70–1.33)
Glucose, Bld: 94 mg/dL (ref 65–139)
Potassium: 4.1 mmol/L (ref 3.5–5.3)
Sodium: 136 mmol/L (ref 135–146)

## 2019-02-03 LAB — HEPATIC FUNCTION PANEL
AG Ratio: 1.4 (calc) (ref 1.0–2.5)
ALT: 63 U/L — ABNORMAL HIGH (ref 9–46)
AST: 108 U/L — ABNORMAL HIGH (ref 10–35)
Albumin: 3.9 g/dL (ref 3.6–5.1)
Alkaline phosphatase (APISO): 96 U/L (ref 35–144)
Bilirubin, Direct: 0.1 mg/dL (ref 0.0–0.2)
Globulin: 2.7 g/dL (calc) (ref 1.9–3.7)
Indirect Bilirubin: 0.5 mg/dL (calc) (ref 0.2–1.2)
Total Bilirubin: 0.6 mg/dL (ref 0.2–1.2)
Total Protein: 6.6 g/dL (ref 6.1–8.1)

## 2019-02-05 ENCOUNTER — Other Ambulatory Visit: Payer: Self-pay

## 2019-02-05 ENCOUNTER — Ambulatory Visit (INDEPENDENT_AMBULATORY_CARE_PROVIDER_SITE_OTHER): Payer: Self-pay | Admitting: Nurse Practitioner

## 2019-02-05 ENCOUNTER — Encounter: Payer: Self-pay | Admitting: Nurse Practitioner

## 2019-02-05 VITALS — BP 126/83 | HR 71 | Temp 96.9°F | Ht 74.0 in | Wt 211.2 lb

## 2019-02-05 DIAGNOSIS — R197 Diarrhea, unspecified: Secondary | ICD-10-CM

## 2019-02-05 DIAGNOSIS — R1084 Generalized abdominal pain: Secondary | ICD-10-CM

## 2019-02-05 DIAGNOSIS — K219 Gastro-esophageal reflux disease without esophagitis: Secondary | ICD-10-CM

## 2019-02-05 MED ORDER — DICYCLOMINE HCL 20 MG PO TABS: 20.0000 mg | ORAL_TABLET | Freq: Two times a day (BID) | ORAL | 2 refills | Status: DC | PRN

## 2019-02-05 MED ORDER — PANTOPRAZOLE SODIUM 40 MG PO TBEC: 40.0000 mg | DELAYED_RELEASE_TABLET | Freq: Two times a day (BID) | ORAL | 2 refills | Status: DC

## 2019-02-05 NOTE — Progress Notes (Signed)
Referring Provider: Celene Squibb, MD Primary Care Physician:  Celene Squibb, MD Primary GI:  Dr. Gala Romney  Chief Complaint  Patient presents with  . Abdominal Pain  . Diarrhea    HPI:   Brandon Miller is a 54 y.o. male who presents for follow-up on abdominal pain and diarrhea.  Patient was last seen in our office 11/05/2018 for the same as well as elevated LFTs.  Previous admission in October 2020 for abdominal pain and diarrhea of presumed infectious etiology.  Mild elevation of AST/ALT of 85/52 at that time along with alkaline phosphatase of 167.  CT of the abdomen showed nonspecific focally dilated loop of ileum within the right mid abdomen which appears new from prior exam with a gradual area of tapering in the distal ileal loops.  This is felt to be possibly representative of a focal ileus or partial small bowel obstruction.  Also colonic diverticuli mild perirectal wall thickening.  Of note, no fever or WBC count.  Likely persistent gastroenteritis and due to significant resolution of symptoms stool sample was not obtained.  Plan for EGD and colonoscopy as outpatient.  At his last visit he stated he was somewhat better.  Abdominal pain is present, sore, but significantly improved.  Some soft stools but no further diarrhea.  Abdominal pain is generalized and currently 5/10.  Last EtOH was about a week ago for hospitalization.  No other overt GI complaints.  Recommended probiotics, schedule colonoscopy and upper endoscopy, avoid alcohol, recheck liver labs in 1 month, follow-up in 3 months.  Hepatic function panel was not checked until 02/03/2019 which found persistent mild elevation of LFTs including AST/ALT at 108/63, normal bilirubin and alkaline phosphatase.  His colonoscopy and endoscopy is scheduled for 02/12/2019.  It appears patient was again in emergency room [id number] for periumbilical abdominal pain.  Noted history of ileus, onset the day before with 36 hours of increasing pain in  the left periumbilical area.  Notes associated nausea and several episodes of vomiting.  CMP with mild bump in creatinine 1.35 indicative of possible mild dehydration.  AST is elevated at 96, ALT normal at 40, other LFTs normal.  Lipase and CBC were both normal.  CT of the abdomen and pelvis completed 01/17/2019 found no acute intra-abdominal or pelvic pathology to explain symptoms.  Noted diverticulosis without diverticulitis.  Follow-up BMP 02/03/2019 showed resolution of elevated creatinine at 1.04.  HFP the same day found persistent transaminitis with AST/ALT of 108/63, other LFTs normal.  Today he states he's doing ok overall.  He is still having left-sided periumbilical abdominal pain.  He also notes diarrhea. Also with upper abdominal pain that was severe. Denies overt GERD symptoms. Occasional nausea, rare vomiting (last episode a week ago). Not as bad as when he was hospitalized. Left-sided pain described as sharp, constant but waxes and wanes in intensity. Has a bowel movement about every day, stools soft/pass easily. No diarrhea in some time. Stools mostly [place] 5-6. Had an episode of of what he thinks was some red blood in his stools earlier this week; no recurrence. Denies melena, fever, chills, unintentional weight loss. Denies URI or flu-like symptoms. Denies loss of sense of taste or smell. Denies chest pain, dyspnea, dizziness, lightheadedness, syncope, near syncope. Denies any other upper or lower GI symptoms.  Past Medical History:  Diagnosis Date  . Allergy   . Anemia   . Anxiety   . Arthritis    RA  . Complication of anesthesia  hard to wake up-was told he may have sleep apnea-never tested  . Depression   . Diabetes mellitus without complication ([place])   . Sleep apnea   . Snores     Past Surgical History:  Procedure Laterality Date  . ELBOW SURGERY Right 2010   Dr. Percell Miller  . HERNIA REPAIR     Right inguinal  . KNEE ARTHROSCOPY WITH MEDIAL MENISECTOMY Right 06/11/2013     Procedure: RIGHT KNEE ARTHROSCOPY WITH MEDIAL MENISECTOMY AND CHONDROPLASTY;  Surgeon: Ninetta Lights, MD;  Location: [facility];  Service: Orthopedics;  Laterality: Right;  Chondroplasty, loose bodies, medial menisectomy, lysis of adhesions.  Marland Kitchen KNEE SURGERY Right 2011  . right ankle    . SHOULDER SURGERY Right 2010   Dr. Percell Miller  . TOTAL KNEE ARTHROPLASTY Right 08/25/2014   Procedure: TOTAL KNEE ARTHROPLASTY;  Surgeon: Ninetta Lights, MD;  Location: [facility];  Service: Orthopedics;  Laterality: Right;    Current Outpatient Medications  Medication Sig Dispense Refill  . acetaminophen (TYLENOL) 500 MG tablet Take 1,000 mg by mouth every 6 (six) hours as needed for moderate pain or headache.    . amphetamine-dextroamphetamine (ADDERALL) 15 MG tablet Take 15 mg by mouth 2 (two) times daily.    . calcium-vitamin D 250-100 MG-UNIT tablet Take 1 tablet by mouth 2 (two) times daily. (Patient not taking: Reported on 02/04/2019) 60 tablet 0  . dicyclomine (BENTYL) 20 MG tablet Take 1 tablet (20 mg total) by mouth 2 (two) times daily. (Patient not taking: Reported on 02/04/2019) 20 tablet 0  . folic acid (FOLVITE) 1 MG tablet Take 1 tablet (1 mg total) by mouth daily. (Patient not taking: Reported on 02/04/2019) 30 tablet 3  . LORazepam (ATIVAN) 1 MG tablet Take 1 mg by mouth 2 (two) times daily.     . pantoprazole (PROTONIX) 40 MG tablet Take 1 tablet (40 mg total) by mouth 2 (two) times daily before a meal. (Patient not taking: Reported on 02/04/2019) 60 tablet 1  . Probiotic CAPS Take 1 capsule by mouth daily as needed (stomach issues).    . thiamine 100 MG tablet Take 1 tablet (100 mg total) by mouth daily. (Patient not taking: Reported on 02/04/2019) 30 tablet 3  . traZODone (DESYREL) 150 MG tablet Take 1 tablet (150 mg total) by mouth at bedtime. 90 tablet 1   No current facility-administered medications for this visit.    Allergies as of 02/05/2019 - Review Complete 02/04/2019   Allergen Reaction Noted  . Daypro [oxaprozin] Other (See Comments) 08/25/2014  . Diclofenac sodium Other (See Comments) 02/18/2015  . Indocin [indomethacin] Nausea And Vomiting 10/29/2011  . Zanaflex [tizanidine hcl] Swelling 10/29/2011    Family History  Problem Relation Age of Onset  . Hepatitis Mother   . Cirrhosis Mother        Did not drink  . Pneumonia Father   . Kidney failure Father   . Gastric cancer Brother        ? Gastric CA spread to esophagus  . Colon cancer Neg Hx     Social History   Socioeconomic History  . Marital status: Single    Spouse name: Not on file  . Number of children: Not on file  . Years of education: Not on file  . Highest education level: Not on file  Occupational History  . Occupation: unemployed  Tobacco Use  . Smoking status: Current Every Day Smoker    Packs/day: 0.50    Years:  25.00    Pack years: 12.50    Types: Cigarettes  . Smokeless tobacco: Never Used  Substance and Sexual Activity  . Alcohol use: Yes    Comment: Last drink 10/28/2018; previously  2-3x a week.  . Drug use: No  . Sexual activity: Not Currently  Other Topics Concern  . Not on file  Social History Narrative  . Not on file   Social Determinants of Health   Financial Resource Strain:   . Difficulty of Paying Living Expenses: Not on file  Food Insecurity:   . Worried About Charity fundraiser in the Last Year: Not on file  . Ran Out of Food in the Last Year: Not on file  Transportation Needs:   . Lack of Transportation (Medical): Not on file  . Lack of Transportation (Non-Medical): Not on file  Physical Activity:   . Days of Exercise per Week: Not on file  . Minutes of Exercise per Session: Not on file  Stress:   . Feeling of Stress : Not on file  Social Connections:   . Frequency of Communication with Friends and Family: Not on file  . Frequency of Social Gatherings with Friends and Family: Not on file  . Attends Religious Services: Not on file  .  Active Member of Clubs or Organizations: Not on file  . Attends Archivist Meetings: Not on file  . Marital Status: Not on file    Review of Systems: General: Negative for anorexia, weight loss, fever, chills, fatigue, weakness. Eyes: Negative for vision changes.  ENT: Negative for hoarseness, difficulty swallowing , nasal congestion. CV: Negative for chest pain, angina, palpitations, dyspnea on exertion, peripheral edema.  Respiratory: Negative for dyspnea at rest, dyspnea on exertion, cough, sputum, wheezing.  GI: See history of present illness. GU:  Negative for dysuria, hematuria, urinary incontinence, urinary frequency, nocturnal urination.  MS: Negative for joint pain, low back pain.  Derm: Negative for rash or itching.  Neuro: Negative for weakness, abnormal sensation, seizure, frequent headaches, memory loss, confusion.  Psych: Negative for anxiety, depression, suicidal ideation, hallucinations.  Endo: Negative for unusual weight change.  Heme: Negative for bruising or bleeding. Allergy: Negative for rash or hives.   Physical Exam: BP 126/83   Pulse 71   Temp (!) 96.9 F (36.1 C) (Temporal)   Ht 6\' 2"  (1.88 m)   Wt 211 lb 3.2 oz (95.8 kg)   BMI 27.12 kg/m  General:   Alert and oriented. Pleasant and cooperative. Well-nourished and well-developed.  Head:  Normocephalic and atraumatic. Eyes:  Without icterus, sclera clear and conjunctiva pink.  Ears:  Normal auditory acuity. Mouth:  No deformity or lesions, oral mucosa pink.  Throat/Neck:  Supple, without mass or thyromegaly. Cardiovascular:  S1, S2 present without murmurs appreciated. Normal pulses noted. Extremities without clubbing or edema. Respiratory:  Clear to auscultation bilaterally. No wheezes, rales, or rhonchi. No distress.  Gastrointestinal:  +BS, soft, non-tender and non-distended. No HSM noted. No guarding or rebound. No masses appreciated.  Rectal:  Deferred  Musculoskalatal:  Symmetrical  without gross deformities. Normal posture. Skin:  Intact without significant lesions or rashes. Neurologic:  Alert and oriented x4;  grossly normal neurologically. Psych:  Alert and cooperative. Normal mood and affect. Heme/Lymph/Immune: No significant cervical adenopathy. No excessive bruising noted.    02/05/2019 11:17 AM   Disclaimer: This note was dictated with voice recognition software. Similar sounding words can inadvertently be transcribed and may not be corrected upon review.

## 2019-02-05 NOTE — H&P (View-Only) (Signed)
Referring Provider: Celene Squibb, MD Primary Care Physician:  Celene Squibb, MD Primary GI:  Dr. Gala Romney  Chief Complaint  Patient presents with  . Abdominal Pain  . Diarrhea    HPI:   Brandon Miller is a 54 y.o. male who presents for follow-up on abdominal pain and diarrhea.  Patient was last seen in our office 11/05/2018 for the same as well as elevated LFTs.  Previous admission in October 2020 for abdominal pain and diarrhea of presumed infectious etiology.  Mild elevation of AST/ALT of 85/52 at that time along with alkaline phosphatase of 167.  CT of the abdomen showed nonspecific focally dilated loop of ileum within the right mid abdomen which appears new from prior exam with a gradual area of tapering in the distal ileal loops.  This is felt to be possibly representative of a focal ileus or partial small bowel obstruction.  Also colonic diverticuli mild perirectal wall thickening.  Of note, no fever or WBC count.  Likely persistent gastroenteritis and due to significant resolution of symptoms stool sample was not obtained.  Plan for EGD and colonoscopy as outpatient.  At his last visit he stated he was somewhat better.  Abdominal pain is present, sore, but significantly improved.  Some soft stools but no further diarrhea.  Abdominal pain is generalized and currently 5/10.  Last EtOH was about a week ago for hospitalization.  No other overt GI complaints.  Recommended probiotics, schedule colonoscopy and upper endoscopy, avoid alcohol, recheck liver labs in 1 month, follow-up in 3 months.  Hepatic function panel was not checked until 02/03/2019 which found persistent mild elevation of LFTs including AST/ALT at 108/63, normal bilirubin and alkaline phosphatase.  His colonoscopy and endoscopy is scheduled for 02/12/2019.  It appears patient was again in emergency room [id number] for periumbilical abdominal pain.  Noted history of ileus, onset the day before with 36 hours of increasing pain in  the left periumbilical area.  Notes associated nausea and several episodes of vomiting.  CMP with mild bump in creatinine 1.35 indicative of possible mild dehydration.  AST is elevated at 96, ALT normal at 40, other LFTs normal.  Lipase and CBC were both normal.  CT of the abdomen and pelvis completed 01/17/2019 found no acute intra-abdominal or pelvic pathology to explain symptoms.  Noted diverticulosis without diverticulitis.  Follow-up BMP 02/03/2019 showed resolution of elevated creatinine at 1.04.  HFP the same day found persistent transaminitis with AST/ALT of 108/63, other LFTs normal.  Today he states he's doing ok overall.  He is still having left-sided periumbilical abdominal pain.  He also notes diarrhea. Also with upper abdominal pain that was severe. Denies overt GERD symptoms. Occasional nausea, rare vomiting (last episode a week ago). Not as bad as when he was hospitalized. Left-sided pain described as sharp, constant but waxes and wanes in intensity. Has a bowel movement about every day, stools soft/pass easily. No diarrhea in some time. Stools mostly [place] 5-6. Had an episode of of what he thinks was some red blood in his stools earlier this week; no recurrence. Denies melena, fever, chills, unintentional weight loss. Denies URI or flu-like symptoms. Denies loss of sense of taste or smell. Denies chest pain, dyspnea, dizziness, lightheadedness, syncope, near syncope. Denies any other upper or lower GI symptoms.  Past Medical History:  Diagnosis Date  . Allergy   . Anemia   . Anxiety   . Arthritis    RA  . Complication of anesthesia  hard to wake up-was told he may have sleep apnea-never tested  . Depression   . Diabetes mellitus without complication ([place])   . Sleep apnea   . Snores     Past Surgical History:  Procedure Laterality Date  . ELBOW SURGERY Right 2010   Dr. Percell Miller  . HERNIA REPAIR     Right inguinal  . KNEE ARTHROSCOPY WITH MEDIAL MENISECTOMY Right 06/11/2013     Procedure: RIGHT KNEE ARTHROSCOPY WITH MEDIAL MENISECTOMY AND CHONDROPLASTY;  Surgeon: Ninetta Lights, MD;  Location: [facility];  Service: Orthopedics;  Laterality: Right;  Chondroplasty, loose bodies, medial menisectomy, lysis of adhesions.  Marland Kitchen KNEE SURGERY Right 2011  . right ankle    . SHOULDER SURGERY Right 2010   Dr. Percell Miller  . TOTAL KNEE ARTHROPLASTY Right 08/25/2014   Procedure: TOTAL KNEE ARTHROPLASTY;  Surgeon: Ninetta Lights, MD;  Location: [facility];  Service: Orthopedics;  Laterality: Right;    Current Outpatient Medications  Medication Sig Dispense Refill  . acetaminophen (TYLENOL) 500 MG tablet Take 1,000 mg by mouth every 6 (six) hours as needed for moderate pain or headache.    . amphetamine-dextroamphetamine (ADDERALL) 15 MG tablet Take 15 mg by mouth 2 (two) times daily.    . calcium-vitamin D 250-100 MG-UNIT tablet Take 1 tablet by mouth 2 (two) times daily. (Patient not taking: Reported on 02/04/2019) 60 tablet 0  . dicyclomine (BENTYL) 20 MG tablet Take 1 tablet (20 mg total) by mouth 2 (two) times daily. (Patient not taking: Reported on 02/04/2019) 20 tablet 0  . folic acid (FOLVITE) 1 MG tablet Take 1 tablet (1 mg total) by mouth daily. (Patient not taking: Reported on 02/04/2019) 30 tablet 3  . LORazepam (ATIVAN) 1 MG tablet Take 1 mg by mouth 2 (two) times daily.     . pantoprazole (PROTONIX) 40 MG tablet Take 1 tablet (40 mg total) by mouth 2 (two) times daily before a meal. (Patient not taking: Reported on 02/04/2019) 60 tablet 1  . Probiotic CAPS Take 1 capsule by mouth daily as needed (stomach issues).    . thiamine 100 MG tablet Take 1 tablet (100 mg total) by mouth daily. (Patient not taking: Reported on 02/04/2019) 30 tablet 3  . traZODone (DESYREL) 150 MG tablet Take 1 tablet (150 mg total) by mouth at bedtime. 90 tablet 1   No current facility-administered medications for this visit.    Allergies as of 02/05/2019 - Review Complete 02/04/2019   Allergen Reaction Noted  . Daypro [oxaprozin] Other (See Comments) 08/25/2014  . Diclofenac sodium Other (See Comments) 02/18/2015  . Indocin [indomethacin] Nausea And Vomiting 10/29/2011  . Zanaflex [tizanidine hcl] Swelling 10/29/2011    Family History  Problem Relation Age of Onset  . Hepatitis Mother   . Cirrhosis Mother        Did not drink  . Pneumonia Father   . Kidney failure Father   . Gastric cancer Brother        ? Gastric CA spread to esophagus  . Colon cancer Neg Hx     Social History   Socioeconomic History  . Marital status: Single    Spouse name: Not on file  . Number of children: Not on file  . Years of education: Not on file  . Highest education level: Not on file  Occupational History  . Occupation: unemployed  Tobacco Use  . Smoking status: Current Every Day Smoker    Packs/day: 0.50    Years:  25.00    Pack years: 12.50    Types: Cigarettes  . Smokeless tobacco: Never Used  Substance and Sexual Activity  . Alcohol use: Yes    Comment: Last drink 10/28/2018; previously  2-3x a week.  . Drug use: No  . Sexual activity: Not Currently  Other Topics Concern  . Not on file  Social History Narrative  . Not on file   Social Determinants of Health   Financial Resource Strain:   . Difficulty of Paying Living Expenses: Not on file  Food Insecurity:   . Worried About Charity fundraiser in the Last Year: Not on file  . Ran Out of Food in the Last Year: Not on file  Transportation Needs:   . Lack of Transportation (Medical): Not on file  . Lack of Transportation (Non-Medical): Not on file  Physical Activity:   . Days of Exercise per Week: Not on file  . Minutes of Exercise per Session: Not on file  Stress:   . Feeling of Stress : Not on file  Social Connections:   . Frequency of Communication with Friends and Family: Not on file  . Frequency of Social Gatherings with Friends and Family: Not on file  . Attends Religious Services: Not on file  .  Active Member of Clubs or Organizations: Not on file  . Attends Archivist Meetings: Not on file  . Marital Status: Not on file    Review of Systems: General: Negative for anorexia, weight loss, fever, chills, fatigue, weakness. Eyes: Negative for vision changes.  ENT: Negative for hoarseness, difficulty swallowing , nasal congestion. CV: Negative for chest pain, angina, palpitations, dyspnea on exertion, peripheral edema.  Respiratory: Negative for dyspnea at rest, dyspnea on exertion, cough, sputum, wheezing.  GI: See history of present illness. GU:  Negative for dysuria, hematuria, urinary incontinence, urinary frequency, nocturnal urination.  MS: Negative for joint pain, low back pain.  Derm: Negative for rash or itching.  Neuro: Negative for weakness, abnormal sensation, seizure, frequent headaches, memory loss, confusion.  Psych: Negative for anxiety, depression, suicidal ideation, hallucinations.  Endo: Negative for unusual weight change.  Heme: Negative for bruising or bleeding. Allergy: Negative for rash or hives.   Physical Exam: BP 126/83   Pulse 71   Temp (!) 96.9 F (36.1 C) (Temporal)   Ht 6\' 2"  (1.88 m)   Wt 211 lb 3.2 oz (95.8 kg)   BMI 27.12 kg/m  General:   Alert and oriented. Pleasant and cooperative. Well-nourished and well-developed.  Head:  Normocephalic and atraumatic. Eyes:  Without icterus, sclera clear and conjunctiva pink.  Ears:  Normal auditory acuity. Mouth:  No deformity or lesions, oral mucosa pink.  Throat/Neck:  Supple, without mass or thyromegaly. Cardiovascular:  S1, S2 present without murmurs appreciated. Normal pulses noted. Extremities without clubbing or edema. Respiratory:  Clear to auscultation bilaterally. No wheezes, rales, or rhonchi. No distress.  Gastrointestinal:  +BS, soft, non-tender and non-distended. No HSM noted. No guarding or rebound. No masses appreciated.  Rectal:  Deferred  Musculoskalatal:  Symmetrical  without gross deformities. Normal posture. Skin:  Intact without significant lesions or rashes. Neurologic:  Alert and oriented x4;  grossly normal neurologically. Psych:  Alert and cooperative. Normal mood and affect. Heme/Lymph/Immune: No significant cervical adenopathy. No excessive bruising noted.    02/05/2019 11:17 AM   Disclaimer: This note was dictated with voice recognition software. Similar sounding words can inadvertently be transcribed and may not be corrected upon review.

## 2019-02-05 NOTE — Patient Instructions (Addendum)
Your health issues we discussed today were:   Abdominal pain: 1. I printed a prescription for Bentyl 10 mg.  You can take this 2-3 times a day as needed for loose stools or abdominal pain 2. I have refilled your Protonix for twice daily.  We will provide you with a good Rx coupon to help affording medications 3. If you can only afford once a day Protonix at this time, call and let us know and we can print or send a new prescription to the pharmacy 4. As we discussed, eat a bland diet and avoid things that make your symptoms worse.  Further information about a bland diet as below 5. Call us if you have any worsening or severe symptoms 6. After your colonoscopy and upper endoscopy completed as scheduled  Overall I recommend:  1. Continue your other current medications 2. Return for follow-up in 6 to 8 weeks 3. Call us if you have any questions or concerns.   COVID-19 Vaccine Information can be found at: [email or web address] For questions related to vaccine distribution or appointments, please email [email or web address] or call [phone].     At [facility] we value your feedback. You may receive a survey about your visit today. Please share your experience as we strive to create trusting relationships with our patients to provide genuine, compassionate, quality care.  We appreciate your understanding and patience as we review any laboratory studies, imaging, and other diagnostic tests that are ordered as we care for you. Our office policy is 5 business days for review of these results, and any emergent or urgent results are addressed in a timely manner for your best interest. If you do not hear from our office in 1 week, please contact us.   We also encourage the use of MyChart, which contains your medical information for your review as well. If you are not enrolled in this feature, an access code is on this after  visit summary for your convenience. Thank you for allowing [place] to be involved in your care.  It was great to see you today!  I hope you have a Happy New Year!!       Criss Rosales Diet A bland diet consists of foods that are often soft and do not have a lot of fat, fiber, or extra seasonings. Foods without fat, fiber, or seasoning are easier for the body to digest. They are also less likely to irritate your mouth, throat, stomach, and other parts of your digestive system. A bland diet is sometimes called a BRAT diet. What is my plan? Your health care provider or food and nutrition specialist (dietitian) may recommend specific changes to your diet to prevent symptoms or to treat your symptoms. These changes may include:  Eating small meals often.  Cooking food until it is soft enough to chew easily.  Chewing your food well.  Drinking fluids slowly.  Not eating foods that are very spicy, sour, or fatty.  Not eating citrus fruits, such as oranges and grapefruit. What do I need to know about this diet?  Eat a variety of foods from the bland diet food list.  Do not follow a bland diet longer than needed.  Ask your health care provider whether you should take vitamins or supplements. What foods can I eat? Grains  Hot cereals, such as cream of wheat. Rice. Bread, crackers, or tortillas made from refined white flour. Vegetables Canned or cooked vegetables. Mashed or boiled potatoes. Fruits  Bananas. Applesauce. Other types of cooked  or canned fruit with the skin and seeds removed, such as canned peaches or pears. Meats and other proteins  Scrambled eggs. Creamy peanut butter or other nut butters. Lean, well-cooked meats, such as chicken or fish. Tofu. Soups or broths. Dairy Low-fat dairy products, such as milk, cottage cheese, or yogurt. Beverages  Water. Herbal tea. Apple juice. Fats and oils Mild salad dressings. Canola or olive oil. Sweets and desserts Pudding. Custard. Fruit  gelatin. Ice cream. The items listed above may not be a complete list of recommended foods and beverages. Contact a dietitian for more options. What foods are not recommended? Grains Whole grain breads and cereals. Vegetables Raw vegetables. Fruits Raw fruits, especially citrus, berries, or dried fruits. Dairy Whole fat dairy foods. Beverages Caffeinated drinks. Alcohol. Seasonings and condiments Strongly flavored seasonings or condiments. Hot sauce. Salsa. Other foods Spicy foods. Fried foods. Sour foods, such as pickled or fermented foods. Foods with high sugar content. Foods high in fiber. The items listed above may not be a complete list of foods and beverages to avoid. Contact a dietitian for more information. Summary  A bland diet consists of foods that are often soft and do not have a lot of fat, fiber, or extra seasonings.  Foods without fat, fiber, or seasoning are easier for the body to digest.  Check with your health care provider to see how long you should follow this diet plan. It is not meant to be followed for long periods. This information is not intended to replace advice given to you by your health care provider. Make sure you discuss any questions you have with your health care provider. Document Revised: 02/06/2017 Document Reviewed: 02/06/2017 Elsevier Patient Education  2020 [employer].

## 2019-02-06 DIAGNOSIS — K219 Gastro-esophageal reflux disease without esophagitis: Secondary | ICD-10-CM | POA: Insufficient documentation

## 2019-02-06 NOTE — Assessment & Plan Note (Signed)
Overall his GERD symptoms are improved on PPI.  However, he ran out of Protonix and could not afford more.  He currently does not have insurance.  I recommended he restart Protonix once daily at least, twice daily if able.  We will give him a good Rx coupon and a printed prescription to assist him in getting his medications for already.  We will also recommend he follow-up with the community health and wellness clinic in [place] for possible medication assistance.  Follow-up in 6 to 8 weeks.

## 2019-02-06 NOTE — Assessment & Plan Note (Signed)
Previously with significant diarrhea although he has not had any in some time.  Stools are soft mostly [place] 5.  Abdominal pain also improved.  I recommended Bentyl for his abdominal pain which could also help with his softer stools.  Call for any worsening or severe diarrhea.  Medication assistance to help with cost of prescriptions.  Bland diet for now to help prevent exacerbation of GERD, abdominal pain.  He can advance his diet as tolerated.  Follow-up in 6 to 8 weeks.

## 2019-02-06 NOTE — Assessment & Plan Note (Signed)
Some left-sided periumbilical abdominal pain and diarrhea although his pain overall is improved since hospitalization.  It does wax and wane in intensity.  Daily bowel movements but denies diarrhea in some time.  I recommended restarting Bentyl and a prescription was printed.  He can follow-up with community health and wellness for possible medication assistance.  We have also given him a good Rx coupon to help with medication cost.  Follow-up in 6 to 8 weeks.  Start with a bland diet and advance as tolerated.  Written instructions and education given to him.

## 2019-02-09 ENCOUNTER — Encounter (HOSPITAL_COMMUNITY): Payer: Self-pay

## 2019-02-09 ENCOUNTER — Other Ambulatory Visit: Payer: Self-pay

## 2019-02-09 NOTE — Progress Notes (Signed)
CC'ED TO PCP 

## 2019-02-10 ENCOUNTER — Encounter (HOSPITAL_COMMUNITY)
Admission: RE | Admit: 2019-02-10 | Discharge: 2019-02-10 | Disposition: A | Discharge status: Home / Self Care | Payer: Self-pay | Source: Ambulatory Visit | Attending: Internal Medicine | Admitting: Internal Medicine

## 2019-02-10 ENCOUNTER — Ambulatory Visit: Payer: Self-pay | Admitting: Orthopaedic Surgery

## 2019-02-10 ENCOUNTER — Other Ambulatory Visit (HOSPITAL_COMMUNITY)
Admission: RE | Admit: 2019-02-10 | Discharge: 2019-02-10 | Disposition: A | Discharge status: Home / Self Care | Payer: Self-pay | Source: Ambulatory Visit | Attending: Internal Medicine | Admitting: Internal Medicine

## 2019-02-10 ENCOUNTER — Telehealth: Payer: Self-pay | Admitting: *Deleted

## 2019-02-10 DIAGNOSIS — Z01812 Encounter for preprocedural laboratory examination: Secondary | ICD-10-CM | POA: Insufficient documentation

## 2019-02-10 DIAGNOSIS — Z20822 Contact with and (suspected) exposure to covid-19: Secondary | ICD-10-CM | POA: Insufficient documentation

## 2019-02-10 HISTORY — DX: Unspecified convulsions: R56.9

## 2019-02-10 LAB — SARS CORONAVIRUS 2 (TAT 6-24 HRS): SARS Coronavirus 2: NEGATIVE

## 2019-02-10 NOTE — Telephone Encounter (Signed)
Patient called back. He agree'd to move up on schedule to 9:15am for procedure. Aware to drink 2nd half of prep at 4:15am, npo 6:15am. Called endo and LMOVM making aware

## 2019-02-10 NOTE — Telephone Encounter (Signed)
LMOVM for pt 

## 2019-02-12 ENCOUNTER — Ambulatory Visit (HOSPITAL_COMMUNITY): Payer: Self-pay | Admitting: Anesthesiology

## 2019-02-12 ENCOUNTER — Encounter (HOSPITAL_COMMUNITY): Payer: Self-pay | Admitting: Internal Medicine

## 2019-02-12 ENCOUNTER — Other Ambulatory Visit: Payer: Self-pay

## 2019-02-12 ENCOUNTER — Ambulatory Visit (HOSPITAL_COMMUNITY)
Admission: RE | Admit: 2019-02-12 | Discharge: 2019-02-12 | Disposition: A | Discharge status: Home / Self Care | Payer: Self-pay | Attending: Internal Medicine | Admitting: Internal Medicine

## 2019-02-12 ENCOUNTER — Encounter (HOSPITAL_COMMUNITY)
Admission: RE | Disposition: A | Discharge status: Home / Self Care | Payer: Self-pay | Source: Home / Self Care | Attending: Internal Medicine

## 2019-02-12 DIAGNOSIS — R197 Diarrhea, unspecified: Secondary | ICD-10-CM

## 2019-02-12 DIAGNOSIS — K635 Polyp of colon: Secondary | ICD-10-CM

## 2019-02-12 DIAGNOSIS — D124 Benign neoplasm of descending colon: Secondary | ICD-10-CM | POA: Insufficient documentation

## 2019-02-12 DIAGNOSIS — Z96651 Presence of right artificial knee joint: Secondary | ICD-10-CM | POA: Insufficient documentation

## 2019-02-12 DIAGNOSIS — Z79899 Other long term (current) drug therapy: Secondary | ICD-10-CM | POA: Insufficient documentation

## 2019-02-12 DIAGNOSIS — K3189 Other diseases of stomach and duodenum: Secondary | ICD-10-CM

## 2019-02-12 DIAGNOSIS — F17210 Nicotine dependence, cigarettes, uncomplicated: Secondary | ICD-10-CM | POA: Insufficient documentation

## 2019-02-12 DIAGNOSIS — F419 Anxiety disorder, unspecified: Secondary | ICD-10-CM | POA: Insufficient documentation

## 2019-02-12 DIAGNOSIS — K625 Hemorrhage of anus and rectum: Secondary | ICD-10-CM

## 2019-02-12 DIAGNOSIS — Z888 Allergy status to other drugs, medicaments and biological substances status: Secondary | ICD-10-CM | POA: Insufficient documentation

## 2019-02-12 DIAGNOSIS — G4730 Sleep apnea, unspecified: Secondary | ICD-10-CM | POA: Insufficient documentation

## 2019-02-12 DIAGNOSIS — K2950 Unspecified chronic gastritis without bleeding: Secondary | ICD-10-CM | POA: Insufficient documentation

## 2019-02-12 DIAGNOSIS — M1990 Unspecified osteoarthritis, unspecified site: Secondary | ICD-10-CM | POA: Insufficient documentation

## 2019-02-12 DIAGNOSIS — F329 Major depressive disorder, single episode, unspecified: Secondary | ICD-10-CM | POA: Insufficient documentation

## 2019-02-12 DIAGNOSIS — E119 Type 2 diabetes mellitus without complications: Secondary | ICD-10-CM | POA: Insufficient documentation

## 2019-02-12 DIAGNOSIS — K269 Duodenal ulcer, unspecified as acute or chronic, without hemorrhage or perforation: Secondary | ICD-10-CM

## 2019-02-12 DIAGNOSIS — R1084 Generalized abdominal pain: Secondary | ICD-10-CM

## 2019-02-12 HISTORY — PX: POLYPECTOMY: SHX5525

## 2019-02-12 HISTORY — PX: COLONOSCOPY WITH PROPOFOL: SHX5780

## 2019-02-12 HISTORY — PX: BIOPSY: SHX5522

## 2019-02-12 HISTORY — PX: ESOPHAGOGASTRODUODENOSCOPY (EGD) WITH PROPOFOL: SHX5813

## 2019-02-12 SURGERY — COLONOSCOPY WITH PROPOFOL
Anesthesia: General

## 2019-02-12 MED ORDER — KETAMINE HCL 50 MG/5ML IJ SOSY
PREFILLED_SYRINGE | INTRAMUSCULAR | Status: AC
  Filled 2019-02-12: qty 5

## 2019-02-12 MED ORDER — LACTATED RINGERS IV SOLN: INTRAVENOUS | Status: DC | PRN

## 2019-02-12 MED ORDER — PROPOFOL 10 MG/ML IV BOLUS
INTRAVENOUS | Status: DC | PRN
  Administered 2019-02-12 (×8): 20 mg via INTRAVENOUS

## 2019-02-12 MED ORDER — CHLORHEXIDINE GLUCONATE CLOTH 2 % EX PADS: 6.0000 | MEDICATED_PAD | Freq: Once | CUTANEOUS | Status: DC

## 2019-02-12 MED ORDER — MIDAZOLAM HCL 2 MG/2ML IJ SOLN
INTRAMUSCULAR | Status: AC
  Filled 2019-02-12: qty 2

## 2019-02-12 MED ORDER — PROPOFOL 500 MG/50ML IV EMUL
INTRAVENOUS | Status: DC | PRN
  Administered 2019-02-12: 150 ug/kg/min via INTRAVENOUS

## 2019-02-12 MED ORDER — KETAMINE HCL 10 MG/ML IJ SOLN
INTRAMUSCULAR | Status: DC | PRN
  Administered 2019-02-12: 20 mg via INTRAVENOUS
  Administered 2019-02-12: 10 mg via INTRAVENOUS

## 2019-02-12 MED ORDER — LACTATED RINGERS IV SOLN
Freq: Once | INTRAVENOUS | Status: AC
  Administered 2019-02-12: 08:00:00 1000 mL via INTRAVENOUS

## 2019-02-12 NOTE — Op Note (Signed)
[facility] Patient Name: Ritvik Popoca Procedure Date: 02/12/2019 8:35 AM MRN: [id number] Date of Birth: [birth year] Attending MD: Norvel Richards , MD CSN: [id number] Age: 54 Admit Type: Outpatient Procedure:                Colonoscopy Indications:              Rectal bleeding Providers:                Norvel Richards, MD, Janeece Riggers, RN, Aram Candela Referring MD:              Medicines:                Propofol per Anesthesia Complications:            No immediate complications. Estimated Blood Loss:     Estimated blood loss was minimal. Procedure:                Pre-Anesthesia Assessment:                           - Prior to the procedure, a History and Physical                            was performed, and patient medications and                            allergies were reviewed. The patient's tolerance of                            previous anesthesia was also reviewed. The risks                            and benefits of the procedure and the sedation                            options and risks were discussed with the patient.                            All questions were answered, and informed consent                            was obtained. Prior Anticoagulants: The patient has                            taken no previous anticoagulant or antiplatelet                            agents. ASA Grade Assessment: III - A patient with                            severe systemic disease. After reviewing the risks  and benefits, the patient was deemed in                            satisfactory condition to undergo the procedure.                           After obtaining informed consent, the colonoscope                            was passed under direct vision. Throughout the                            procedure, the patient's blood pressure, pulse, and                            oxygen saturations were monitored  continuously. The                            [id number] scope was introduced through                            the anus and advanced to the 5 cm into the ileum.                            The colonoscopy was performed without difficulty.                            The patient tolerated the procedure well. The                            quality of the bowel preparation was adequate. Scope In: 9:19:19 AM Scope Out: 9:37:20 AM Scope Withdrawal Time: 0 hours 14 minutes 3 seconds  Total Procedure Duration: 0 hours 18 minutes 1 second  Findings:      The perianal and digital rectal examinations were normal.      A 5 mm polyp was found in the descending colon. The polyp was sessile.       The polyp was removed with a cold snare. Resection and retrieval were       complete. Estimated blood loss was minimal. Estimated blood loss was       minimal.      The exam was otherwise without abnormality on direct and retroflexion       views. Impression:               - One 5 mm polyp in the descending colon, removed                            with a cold snare. Resected and retrieved.                           - The examination was otherwise normal on direct                            and retroflexion views. Moderate Sedation:  Moderate (conscious) sedation was personally administered by an       anesthesia professional. The following parameters were monitored: oxygen       saturation, heart rate, blood pressure, respiratory rate, EKG, adequacy       of pulmonary ventilation, and response to care. Recommendation:           - Patient has a contact number available for                            emergencies. The signs and symptoms of potential                            delayed complications were discussed with the                            patient. Return to normal activities tomorrow.                            Written discharge instructions were provided to the                             patient.                           - Advance diet as tolerated.                           - Continue present medications.                           - Repeat colonoscopy date to be determined after                            pending pathology results are reviewed for                            surveillance based on pathology results.                           - Return to GI office in 3 months. See EGD report Procedure Code(s):        --- Professional ---                           [phone], Colonoscopy, flexible; with removal of                            tumor(s), polyp(s), or other lesion(s) by snare                            technique Diagnosis Code(s):        --- Professional ---                           K63.5, Polyp of colon  K62.5, Hemorrhage of anus and rectum CPT copyright 2019 American Medical Association. All rights reserved. The codes documented in this report are preliminary and upon coder review may  be revised to meet current compliance requirements. Cristopher Estimable. Cheresa Siers, MD Norvel Richards, MD 02/12/2019 9:42:38 AM This report has been signed electronically. Number of Addenda: 0

## 2019-02-12 NOTE — Interval H&P Note (Signed)
History and Physical Interval Note:  02/12/2019 9:01 AM  Brandon Miller  has presented today for surgery, with the diagnosis of abdominal pain, diarrhea.  The various methods of treatment have been discussed with the patient and family. After consideration of risks, benefits and other options for treatment, the patient has consented to  Procedure(s) with comments: COLONOSCOPY WITH PROPOFOL (N/A) - 10:30am ESOPHAGOGASTRODUODENOSCOPY (EGD) WITH PROPOFOL (N/A) as a surgical intervention.  The patient's history has been reviewed, patient examined, no change in status, stable for surgery.  I have reviewed the patient's chart and labs.  Questions were answered to the patient's satisfaction.     Manus Rudd  Patient seen in short stay.  Nonspecific of about abdominal pain.  Possible blood per rectum.  Patient denies dysphagia.  EGD and colonoscopy per plan.The risks, benefits, limitations, alternatives and imponderables have been reviewed with the patient. Questions have been answered. All parties are agreeable.

## 2019-02-12 NOTE — Anesthesia Postprocedure Evaluation (Signed)
Anesthesia Post Note  Patient: Brandon Miller  Procedure(s) Performed: COLONOSCOPY WITH PROPOFOL (N/A ) ESOPHAGOGASTRODUODENOSCOPY (EGD) WITH PROPOFOL (N/A ) BIOPSY POLYPECTOMY  Patient location during evaluation: PACU Anesthesia Type: General Level of consciousness: awake and alert and oriented Pain management: pain level controlled Vital Signs Assessment: post-procedure vital signs reviewed and stable Respiratory status: spontaneous breathing Cardiovascular status: blood pressure returned to baseline and stable Postop Assessment: no apparent nausea or vomiting Anesthetic complications: no     Last Vitals:  Vitals:   02/12/19 0800 02/12/19 0945  BP: 118/75 93/60  Pulse: 61 62  Resp: 20 19  Temp: 36.8 C 36.6 C  SpO2: 100% 100%    Last Pain:  Vitals:   02/12/19 0945  TempSrc:   PainSc: (P) 0-No pain                 Manvir Thorson

## 2019-02-12 NOTE — Discharge Instructions (Signed)
Colonoscopy Discharge Instructions  Read the instructions outlined below and refer to this sheet in the next few weeks. These discharge instructions provide you with general information on caring for yourself after you leave the hospital. Your doctor may also give you specific instructions. While your treatment has been planned according to the most current medical practices available, unavoidable complications occasionally occur. If you have any problems or questions after discharge, call Dr. Gala Romney at [phone]. ACTIVITY  You may resume your regular activity, but move at a slower pace for the next 24 hours.   Take frequent rest periods for the next 24 hours.   Walking will help get rid of the air and reduce the bloated feeling in your belly (abdomen).   No driving for 24 hours (because of the medicine (anesthesia) used during the test).    Do not sign any important legal documents or operate any machinery for 24 hours (because of the anesthesia used during the test).  NUTRITION  Drink plenty of fluids.   You may resume your normal diet as instructed by your doctor.   Begin with a light meal and progress to your normal diet. Heavy or fried foods are harder to digest and may make you feel sick to your stomach (nauseated).   Avoid alcoholic beverages for 24 hours or as instructed.  MEDICATIONS  You may resume your normal medications unless your doctor tells you otherwise.  WHAT YOU CAN EXPECT TODAY  Some feelings of bloating in the abdomen.   Passage of more gas than usual.   Spotting of blood in your stool or on the toilet paper.  IF YOU HAD POLYPS REMOVED DURING THE COLONOSCOPY:  No aspirin products for 7 days or as instructed.   No alcohol for 7 days or as instructed.   Eat a soft diet for the next 24 hours.  FINDING OUT THE RESULTS OF YOUR TEST Not all test results are available during your visit. If your test results are not back during the visit, make an appointment  with your caregiver to find out the results. Do not assume everything is normal if you have not heard from your caregiver or the medical facility. It is important for you to follow up on all of your test results.  SEEK IMMEDIATE MEDICAL ATTENTION IF:  You have more than a spotting of blood in your stool.   Your belly is swollen (abdominal distention).   You are nauseated or vomiting.   You have a temperature over 101.   You have abdominal pain or discomfort that is severe or gets worse throughout the day.  EGD Discharge instructions Please read the instructions outlined below and refer to this sheet in the next few weeks. These discharge instructions provide you with general information on caring for yourself after you leave the hospital. Your doctor may also give you specific instructions. While your treatment has been planned according to the most current medical practices available, unavoidable complications occasionally occur. If you have any problems or questions after discharge, please call your doctor. ACTIVITY  You may resume your regular activity but move at a slower pace for the next 24 hours.   Take frequent rest periods for the next 24 hours.   Walking will help expel (get rid of) the air and reduce the bloated feeling in your abdomen.   No driving for 24 hours (because of the anesthesia (medicine) used during the test).   You may shower.   Do not sign any important  legal documents or operate any machinery for 24 hours (because of the anesthesia used during the test).  NUTRITION  Drink plenty of fluids.   You may resume your normal diet.   Begin with a light meal and progress to your normal diet.   Avoid alcoholic beverages for 24 hours or as instructed by your caregiver.  MEDICATIONS  You may resume your normal medications unless your caregiver tells you otherwise.  WHAT YOU CAN EXPECT TODAY  You may experience abdominal discomfort such as a feeling of fullness  or "gas" pains.  FOLLOW-UP  Your doctor will discuss the results of your test with you.  SEEK IMMEDIATE MEDICAL ATTENTION IF ANY OF THE FOLLOWING OCCUR:  Excessive nausea (feeling sick to your stomach) and/or vomiting.   Severe abdominal pain and distention (swelling).   Trouble swallowing.   Temperature over 101 F (37.8 C).   Rectal bleeding or vomiting of blood.   Colon Polyps  Polyps are tissue growths inside the body. Polyps can grow in many places, including the large intestine (colon). A polyp may be a round bump or a mushroom-shaped growth. You could have one polyp or several. Most colon polyps are noncancerous (benign). However, some colon polyps can become cancerous over time. Finding and removing the polyps early can help prevent this. What are the causes? The exact cause of colon polyps is not known. What increases the risk? You are more likely to develop this condition if you:  Have a family history of colon cancer or colon polyps.  Are older than 29 or older than 45 if you are African American.  Have inflammatory bowel disease, such as ulcerative colitis or Crohn's disease.  Have certain hereditary conditions, such as: ? Familial adenomatous polyposis. ? Lynch syndrome. ? Turcot syndrome. ? Peutz-Jeghers syndrome.  Are overweight.  Smoke cigarettes.  Do not get enough exercise.  Drink too much alcohol.  Eat a diet that is high in fat and red meat and low in fiber.  Had childhood cancer that was treated with abdominal radiation. What are the signs or symptoms? Most polyps do not cause symptoms. If you have symptoms, they may include:  Blood coming from your rectum when having a bowel movement.  Blood in your stool. The stool may look dark red or black.  Abdominal pain.  A change in bowel habits, such as constipation or diarrhea. How is this diagnosed? This condition is diagnosed with a colonoscopy. This is a procedure in which a lighted,  flexible scope is inserted into the anus and then passed into the colon to examine the area. Polyps are sometimes found when a colonoscopy is done as part of routine cancer screening tests. How is this treated? Treatment for this condition involves removing any polyps that are found. Most polyps can be removed during a colonoscopy. Those polyps will then be tested for cancer. Additional treatment may be needed depending on the results of testing. Follow these instructions at home: Lifestyle  Maintain a healthy weight, or lose weight if recommended by your health care provider.  Exercise every day or as told by your health care provider.  Do not use any products that contain nicotine or tobacco, such as cigarettes and e-cigarettes. If you need help quitting, ask your health care provider.  If you drink alcohol, limit how much you have: ? 0-1 drink a day for women. ? 0-2 drinks a day for men.  Be aware of how much alcohol is in your drink. In the  [place] one drink equals one 12 oz bottle of beer (355 mL), one 5 oz glass of wine (148 mL), or one 1 oz shot of hard liquor (44 mL). Eating and drinking   Eat foods that are high in fiber, such as fruits, vegetables, and whole grains.  Eat foods that are high in calcium and vitamin D, such as milk, cheese, yogurt, eggs, liver, fish, and broccoli.  Limit foods that are high in fat, such as fried foods and desserts.  Limit the amount of red meat and processed meat you eat, such as hot dogs, sausage, bacon, and lunch meats. General instructions  Keep all follow-up visits as told by your health care provider. This is important. ? This includes having regularly scheduled colonoscopies. ? Talk to your health care provider about when you need a colonoscopy. Contact a health care provider if:  You have new or worsening bleeding during a bowel movement.  You have new or increased blood in your stool.  You have a change in bowel habits.  You lose  weight for no known reason. Summary  Polyps are tissue growths inside the body. Polyps can grow in many places, including the colon.  Most colon polyps are noncancerous (benign), but some can become cancerous over time.  This condition is diagnosed with a colonoscopy.  Treatment for this condition involves removing any polyps that are found. Most polyps can be removed during a colonoscopy. This information is not intended to replace advice given to you by your health care provider. Make sure you discuss any questions you have with your health care provider. Document Revised: 04/25/2017 Document Reviewed: 04/25/2017 Elsevier Patient Education  [employer].  Colon polyp information provided  Further recommendations to follow pending review of pathology report  Office visit with [place] 3 months  At patient request, I called Pam Cocker Hamm at [phone] and reviewed results

## 2019-02-12 NOTE — Anesthesia Preprocedure Evaluation (Addendum)
Anesthesia Evaluation  Patient identified by MRN, date of birth, ID band Patient awake    Reviewed: Allergy & Precautions, NPO status , Patient's Chart, lab work & pertinent test results  History of Anesthesia Complications (+) history of anesthetic complications  Airway        Dental   Pulmonary sleep apnea , pneumonia, resolved, Current Smoker and Patient abstained from smoking.,    Pulmonary exam normal breath sounds clear to auscultation       Cardiovascular Exercise Tolerance: Good Normal cardiovascular exam Rhythm:Regular Rate:Normal     Neuro/Psych Seizures - (Alcohol withdrawl?? only one episode),  PSYCHIATRIC DISORDERS Anxiety Depression    GI/Hepatic GERD  Medicated and Controlled,(+)     substance abuse  alcohol use,   Endo/Other  diabetes, Well Controlled, Type 2  Renal/GU      Musculoskeletal  (+) Arthritis , Osteoarthritis,    Abdominal   Peds  Hematology  (+) anemia ,   Anesthesia Other Findings ADD  Reproductive/Obstetrics                          Anesthesia Physical Anesthesia Plan  ASA: III  Anesthesia Plan: General   Post-op Pain Management:    Induction: Intravenous  PONV Risk Score and Plan:   Airway Management Planned: Nasal Cannula, Natural Airway and Simple Face Mask  Additional Equipment:   Intra-op Plan:   Post-operative Plan:   Informed Consent: I have reviewed the patients History and Physical, chart, labs and discussed the procedure including the risks, benefits and alternatives for the proposed anesthesia with the patient or authorized representative who has indicated his/her understanding and acceptance.     Dental advisory given  Plan Discussed with: CRNA and Surgeon  Anesthesia Plan Comments:        Anesthesia Quick Evaluation

## 2019-02-12 NOTE — Op Note (Signed)
[facility] Patient Name: Brandon Miller Procedure Date: 02/12/2019 8:40 AM MRN: [id number] Date of Birth: [birth year] Attending MD: Norvel Richards , MD CSN: [id number] Age: 54 Admit Type: Outpatient Procedure:                Upper GI endoscopy Indications:              Periumbilical abdominal pain Providers:                Norvel Richards, MD, Janeece Riggers, RN, Aram Candela Referring MD:              Medicines:                Propofol per Anesthesia Complications:            No immediate complications. Estimated Blood Loss:     Estimated blood loss was minimal. Procedure:                Pre-Anesthesia Assessment:                           - Prior to the procedure, a History and Physical                            was performed, and patient medications and                            allergies were reviewed. The patient's tolerance of                            previous anesthesia was also reviewed. The risks                            and benefits of the procedure and the sedation                            options and risks were discussed with the patient.                            All questions were answered, and informed consent                            was obtained. Prior Anticoagulants: The patient has                            taken no previous anticoagulant or antiplatelet                            agents. ASA Grade Assessment: II - A patient with                            mild systemic disease. After reviewing the risks  and benefits, the patient was deemed in                            satisfactory condition to undergo the procedure.                           After obtaining informed consent, the endoscope was                            passed under direct vision. Throughout the                            procedure, the patient's blood pressure, pulse, and                            oxygen saturations  were monitored continuously. The                            [id number] scope was introduced through the                            mouth, and advanced to the second part of duodenum.                            The upper GI endoscopy was accomplished without                            difficulty. The patient tolerated the procedure                            well. Scope In: 9:11:03 AM Scope Out: 9:14:32 AM Total Procedure Duration: 0 hours 3 minutes 29 seconds  Findings:      The examined esophagus was normal.      Multiple erosions with no stigmata of recent bleeding were found in the       entire examined stomach.      Multiple erosions were found in the duodenal bulb. Abnormal gastric       mucosa this was biopsied with a cold forceps for histology. Estimated       blood loss was minimal. Impression:               - Normal esophagus.                           - Erosive gastropathy with no stigmata of recent                            bleeding. Biopsied.                           - Duodenal erosions. Moderate Sedation:      Moderate (conscious) sedation was personally administered by an       anesthesia professional. The following parameters were monitored: oxygen       saturation, heart rate, blood pressure, respiratory rate, EKG, adequacy       of pulmonary ventilation, and  response to care. Recommendation:           - Patient has a contact number available for                            emergencies. The signs and symptoms of potential                            delayed complications were discussed with the                            patient. Return to normal activities tomorrow.                            Written discharge instructions were provided to the                            patient.                           - Advance diet as tolerated. Follow-up on                            pathology. See colonoscopy report. Procedure Code(s):        --- Professional ---                            [phone], Esophagogastroduodenoscopy, flexible,                            transoral; with biopsy, single or multiple Diagnosis Code(s):        --- Professional ---                           K31.89, Other diseases of stomach and duodenum                           K26.9, Duodenal ulcer, unspecified as acute or                            chronic, without hemorrhage or perforation                           A999333, Periumbilical pain CPT copyright 2019 American Medical Association. All rights reserved. The codes documented in this report are preliminary and upon coder review may  be revised to meet current compliance requirements. Cristopher Estimable. [place]anequa Kretz, MD Norvel Richards, MD 02/12/2019 9:39:44 AM This report has been signed electronically. Number of Addenda: 0

## 2019-02-12 NOTE — Transfer of Care (Signed)
Immediate Anesthesia Transfer of Care Note  Patient: Brandon Miller  Procedure(s) Performed: COLONOSCOPY WITH PROPOFOL (N/A ) ESOPHAGOGASTRODUODENOSCOPY (EGD) WITH PROPOFOL (N/A ) BIOPSY POLYPECTOMY  Patient Location: PACU  Anesthesia Type:General  Level of Consciousness: awake  Airway & Oxygen Therapy: Patient Spontanous Breathing  Post-op Assessment: Report given to RN  Post vital signs: Reviewed and stable  Last Vitals:  Vitals Value Taken Time  BP 93/60 02/12/19 0945  Temp 36.6 C 02/12/19 0945  Pulse 49 02/12/19 0946  Resp 17 02/12/19 0946  SpO2 100 % 02/12/19 0946  Vitals shown include unvalidated device data.  Last Pain:  Vitals:   02/12/19 0800  TempSrc: Oral  PainSc: 8       Patients Stated Pain Goal: 8 ([id number])  Complications: No apparent anesthesia complications

## 2019-02-13 ENCOUNTER — Other Ambulatory Visit: Payer: Self-pay

## 2019-02-13 LAB — SURGICAL PATHOLOGY

## 2019-02-18 ENCOUNTER — Encounter: Payer: Self-pay | Admitting: Internal Medicine

## 2019-02-18 NOTE — Progress Notes (Signed)
Send letter to patient. Send copy of letter with path to referring provider and PCP.

## 2019-03-10 ENCOUNTER — Ambulatory Visit: Payer: Self-pay | Admitting: Internal Medicine

## 2019-03-16 ENCOUNTER — Ambulatory Visit: Payer: Self-pay | Admitting: Nurse Practitioner

## 2019-03-20 ENCOUNTER — Telehealth: Payer: Self-pay | Admitting: Licensed Clinical Social Worker

## 2019-03-20 NOTE — Telephone Encounter (Signed)
Patient called and left voicemail with Christa See, LCSW stating that he has tooth pain and would like to be referred to a dental clinic using his [facility] card. This MSW Intern sent a staff message to Dr. Wynetta Emery notifying her of patient's request. This MSW also sent additional dental resources to patient via e-mail. No additional concerns noted.

## 2019-03-21 ENCOUNTER — Telehealth: Payer: Self-pay | Admitting: Internal Medicine

## 2019-03-21 DIAGNOSIS — K0889 Other specified disorders of teeth and supporting structures: Secondary | ICD-10-CM

## 2019-03-21 NOTE — Telephone Encounter (Signed)
-----   Message from Dwana Melena, RN sent at 03/20/2019  2:37 PM EST ----- Regarding: FW: Pt Referral Request  ----- Message ----- From: Berniece Salines Sent: 03/20/2019   9:52 AM EST To: Dwana Melena, RN Subject: Pt Referral Request                            Hi Dr. Wynetta Emery,   Patient called stating he has tooth pain and would like a referral to [facility]. Patient states he has current CAFA and an [employer]. I also sent patient additional dental resources via e-mail. Please advise.   Thank you!

## 2019-03-21 NOTE — Telephone Encounter (Signed)
Dental referral submitted.

## 2019-05-14 ENCOUNTER — Encounter: Payer: Self-pay | Admitting: Nurse Practitioner

## 2019-05-14 ENCOUNTER — Ambulatory Visit (INDEPENDENT_AMBULATORY_CARE_PROVIDER_SITE_OTHER): Payer: Self-pay | Admitting: Nurse Practitioner

## 2019-05-14 ENCOUNTER — Other Ambulatory Visit: Payer: Self-pay

## 2019-05-14 VITALS — BP 112/75 | HR 59 | Temp 96.9°F | Ht 74.0 in | Wt 208.6 lb

## 2019-05-14 DIAGNOSIS — K219 Gastro-esophageal reflux disease without esophagitis: Secondary | ICD-10-CM

## 2019-05-14 DIAGNOSIS — R197 Diarrhea, unspecified: Secondary | ICD-10-CM

## 2019-05-14 DIAGNOSIS — R1084 Generalized abdominal pain: Secondary | ICD-10-CM

## 2019-05-14 NOTE — Assessment & Plan Note (Signed)
Diarrhea has improved.  Continue to monitor and notify us of any recurrence.  Follow-up in 4 months.

## 2019-05-14 NOTE — Assessment & Plan Note (Signed)
Denies overt GERD symptoms such as nausea, vomiting, esophageal reflux/burning, bitter sour taste in the throat.  Use Tums as needed, diet information provided.  Follow-up in 4 months.

## 2019-05-14 NOTE — Assessment & Plan Note (Signed)
Rare epigastric/intermittent abdominal pain.  He previously took Tums and this helped.  He is not able to afford Protonix at this time.  However, he just got a job and will soon get health insurance at which point PPI would likely be affordable.  EGD with gastritis negative for H. pylori.  I recommended that he be on PPI soon as possible to help heal his stomach lining and prevent further symptoms.  We will see him back in 4 months at which point we can likely restart his medication as he should be on insurance by then.  We will print diet information for him to help prevent exacerbating symptoms.

## 2019-05-14 NOTE — Progress Notes (Signed)
Referring Provider: Celene Squibb, MD Primary Care Physician:  Celene Squibb, MD Primary GI:  Dr. Gala Romney  Chief Complaint  Patient presents with  . Abdominal Pain    stopped Protonix-couldn't afford    HPI:   Brandon Miller is a 54 y.o. male who presents for follow-up on abdominal pain and diarrhea.  The patient was last seen in our office 02/05/2019 for generalized abdominal pain, diarrhea, GERD.  Previously elevated LFTs.  AST/ALT of 85/52 during admission in October 2020 with alk phos of 167.  CT of the abdomen showed nonspecific focally dilated loop of ileum within the right mid abdomen which appears new from prior exam with a gradual area of tapering in the distal ileal loops possibly representative of focal ileus or partial small bowel obstruction.  Colonic diverticuli, mild perirectal wall thickening.  No fever WBC count.  Likely persistent gastroenteritis and no stool samples obtained due to resolution of diarrhea.  Plan for EGD and colonoscopy as an outpatient.  After discharge he did have some persistent abdominal discomfort but significantly improved.  Last EtOH about a week ago prior to hospitalization.  He was started on probiotics at a previous visit and recommended colonoscopy and EGD, recheck LFTs in a month.  Updated labs showed persistent elevation including AST/ALT 108/63 on 02/03/2019 with normal bilirubin and alkaline phosphatase.  ER visit 09/62/8366 for periumbilical abdominal pain with nausea and vomiting.  CT unremarkable.  LFTs persistently elevated.  At his last visit still with abdominal pain and diarrhea.  Upper abdominal pain was severe but no other overt GERD symptoms.  Occasional nausea, rare vomiting but not as bad as hospitalization.  Left-sided pain is sharp, constant but waxes and wanes in intensity with a noted bowel movement every day and [place] 4 stools.  No diarrhea in some time.  Felt possibly hematochezia earlier in the week without recurrence.  No  other overt GI complaints.  Recommended Bentyl 10 mg 2-3 times a day as needed for pain, Protonix refilled and recommend continue twice daily, bland diet and avoid triggers, follow-up in 6 to 8 weeks.  Colonoscopy and endoscopy as planned previously.  Colonoscopy completed 02/12/2019 which found a single 5 mm polyp in the descending colon, otherwise normal.  Surgical pathology found the polyp to be tubular adenoma negative for high-grade dysplasia.  Recommended repeat colonoscopy in 5 years (2026).  EGD the same day found normal esophagus, erosive gastropathy with no stigmata of recent bleeding status post biopsy, duodenal erosions.  Surgical pathology found the gastric biopsies to be gastric antral and oxyntic mucosa with mild chronic gastritis negative for H. pylori.  Recommended 73-monthoffice follow-up.  Today he states he's doing ok overall. Had to stop taking Protonix because he couldn't afford it (states it cost about $65.) He doesn't have insurance currently but is now employed and thinks he will get insurance soon. This is his second week there. Rare, brief abdominal pain which is not bad. No overt GERD symptoms. Denies N/V, hematochezia, melena, fever, chills, unintentional weight loss. No significant diarrhea in some time. Denies URI or flu-like symptoms. Denies loss of sense of taste or smell. He has not had a COVID-19 vaccine. He is interested in information on vaccine today. Denies chest pain, dyspnea, dizziness, lightheadedness, syncope, near syncope. Denies any other upper or lower GI symptoms.  Past Medical History:  Diagnosis Date  . Allergy   . Anemia   . Anxiety   . Arthritis  RA  . Complication of anesthesia    hard to wake up-was told he may have sleep apnea-never tested  . Depression   . Diabetes mellitus without complication ([place])    with weight no more problems  . Seizure ([place])    had one seizure of unknown etiology a few tears ago, none since and on no meds for them  .  Sleep apnea   . Snores     Past Surgical History:  Procedure Laterality Date  . BIOPSY  02/12/2019   Procedure: BIOPSY;  Surgeon: Daneil Dolin, MD;  [facility];  Service: Endoscopy;;  stomach  . COLONOSCOPY WITH PROPOFOL N/A 02/12/2019   Procedure: COLONOSCOPY WITH PROPOFOL;  Surgeon: Daneil Dolin, MD;  [facility];  Service: Endoscopy;  Laterality: N/A;  10:30am  . ELBOW SURGERY Right 2010   Dr. Percell Miller  . ESOPHAGOGASTRODUODENOSCOPY (EGD) WITH PROPOFOL N/A 02/12/2019   Procedure: ESOPHAGOGASTRODUODENOSCOPY (EGD) WITH PROPOFOL;  Surgeon: Daneil Dolin, MD;  [facility];  Service: Endoscopy;  Laterality: N/A;  . HERNIA REPAIR     Right inguinal  . KNEE ARTHROSCOPY WITH MEDIAL MENISECTOMY Right 06/11/2013   Procedure: RIGHT KNEE ARTHROSCOPY WITH MEDIAL MENISECTOMY AND CHONDROPLASTY;  Surgeon: Ninetta Lights, MD;  Location: [place];  Service: Orthopedics;  Laterality: Right;  Chondroplasty, loose bodies, medial menisectomy, lysis of adhesions.  Marland Kitchen KNEE SURGERY Right 2011  . POLYPECTOMY  02/12/2019   Procedure: POLYPECTOMY;  Surgeon: Daneil Dolin, MD;  [facility];  Service: Endoscopy;;  . right ankle    . SHOULDER SURGERY Right 2010   Dr. Percell Miller  . TOTAL KNEE ARTHROPLASTY Right 08/25/2014   Procedure: TOTAL KNEE ARTHROPLASTY;  Surgeon: Ninetta Lights, MD;  Location: [place];  Service: Orthopedics;  Laterality: Right;    Current Outpatient Medications  Medication Sig Dispense Refill  . acetaminophen (TYLENOL) 500 MG tablet Take 1,000 mg by mouth every 6 (six) hours as needed for moderate pain or headache.    . amphetamine-dextroamphetamine (ADDERALL) 15 MG tablet Take 15 mg by mouth 2 (two) times daily.    Marland Kitchen dicyclomine (BENTYL) 20 MG tablet Take 1 tablet (20 mg total) by mouth 2 (two) times daily as needed (abdominal pain or loose stool). 30 tablet 2  . LORazepam (ATIVAN) 1 MG tablet Take 1 mg by mouth 2 (two) times  daily.     . Probiotic CAPS Take 1 capsule by mouth daily as needed (stomach issues).    . traZODone (DESYREL) 150 MG tablet Take 1 tablet (150 mg total) by mouth at bedtime. 90 tablet 1   No current facility-administered medications for this visit.    Allergies as of 05/14/2019 - Review Complete 05/14/2019  Allergen Reaction Noted  . Daypro [oxaprozin] Other (See Comments) 08/25/2014  . Diclofenac sodium Other (See Comments) 02/18/2015  . Indocin [indomethacin] Nausea And Vomiting 10/29/2011  . Zanaflex [tizanidine hcl] Swelling 10/29/2011    Family History  Problem Relation Age of Onset  . Hepatitis Mother   . Cirrhosis Mother        Did not drink  . Pneumonia Father   . Kidney failure Father   . Gastric cancer Brother        ? Gastric CA spread to esophagus  . Colon cancer Neg Hx     Social History   Socioeconomic History  . Marital status: Single    Spouse name: Not on file  . Number of  children: Not on file  . Years of education: Not on file  . Highest education level: Not on file  Occupational History  . Occupation: unemployed  Tobacco Use  . Smoking status: Current Every Day Smoker    Packs/day: 0.50    Years: 25.00    Pack years: 12.50    Types: Cigarettes  . Smokeless tobacco: Never Used  Substance and Sexual Activity  . Alcohol use: Yes    Comment: occ  . Drug use: No  . Sexual activity: Not Currently  Other Topics Concern  . Not on file  Social History Narrative  . Not on file   Social Determinants of Health   Financial Resource Strain:   . Difficulty of Paying Living Expenses:   Food Insecurity:   . Worried About Charity fundraiser in the Last Year:   . Arboriculturist in the Last Year:   Transportation Needs:   . Film/video editor (Medical):   Marland Kitchen Lack of Transportation (Non-Medical):   Physical Activity:   . Days of Exercise per Week:   . Minutes of Exercise per Session:   Stress:   . Feeling of Stress :   Social Connections:   .  Frequency of Communication with Friends and Family:   . Frequency of Social Gatherings with Friends and Family:   . Attends Religious Services:   . Active Member of Clubs or Organizations:   . Attends Archivist Meetings:   Marland Kitchen Marital Status:     Subjective: Review of Systems  Constitutional: Negative for chills, fever, malaise/fatigue and weight loss.  HENT: Negative for congestion and sore throat.   Respiratory: Negative for cough and shortness of breath.   Cardiovascular: Negative for chest pain and palpitations.  Gastrointestinal: Positive for abdominal pain (epigastric; rare, improved with TUMS). Negative for blood in stool, diarrhea, melena, nausea and vomiting.  Musculoskeletal: Negative for joint pain and myalgias.  Skin: Negative for rash.  Neurological: Negative for dizziness and weakness.  Endo/Heme/Allergies: Does not bruise/bleed easily.  Psychiatric/Behavioral: Negative for depression. The patient is not nervous/anxious.   All other systems reviewed and are negative.    Objective: BP 112/75   Pulse (!) 59   Temp (!) 96.9 F (36.1 C) (Temporal)   Ht 6' 2" (1.88 m)   Wt 208 lb 9.6 oz (94.6 kg)   BMI 26.78 kg/m  Physical Exam Vitals and nursing note reviewed.  Constitutional:      General: He is not in acute distress.    Appearance: Normal appearance. He is normal weight. He is not ill-appearing, toxic-appearing or diaphoretic.  HENT:     Head: Normocephalic and atraumatic.     Nose: No congestion or rhinorrhea.  Eyes:     General: No scleral icterus. Cardiovascular:     Rate and Rhythm: Normal rate and regular rhythm.     Heart sounds: Normal heart sounds.  Pulmonary:     Effort: Pulmonary effort is normal.     Breath sounds: Normal breath sounds.  Abdominal:     General: Bowel sounds are normal. There is no distension.     Palpations: Abdomen is soft. There is no hepatomegaly, splenomegaly or mass.     Tenderness: There is no abdominal  tenderness. There is no guarding or rebound.     Hernia: No hernia is present.  Musculoskeletal:     Cervical back: Neck supple.  Skin:    General: Skin is warm and dry.  Coloration: Skin is not jaundiced.     Findings: No bruising or rash.  Neurological:     General: No focal deficit present.     Mental Status: He is alert and oriented to person, place, and time. Mental status is at baseline.  Psychiatric:        Mood and Affect: Mood normal.        Behavior: Behavior normal.        Thought Content: Thought content normal.       05/14/2019 10:36 AM   Disclaimer: This note was dictated with voice recognition software. Similar sounding words can inadvertently be transcribed and may not be corrected upon review.

## 2019-05-14 NOTE — Patient Instructions (Signed)
Your health issues we discussed today were:   GERD (reflux/heartburn) with abdominal pain: 1. I am printing information below on foods to avoid that can typically make your symptoms worse 2. As we discussed, avoid all NSAIDs (ibuprofen, Motrin, Advil, Aleve, naproxen, Naprosyn, aspirin, BC powders, Goody powders, anything with "NSAID" on the bottle).  Tylenol is okay 3. You can use Tums for Rolaids as needed for any reflux or abdominal pain to see if it helps 4. We will see you back after you have gotten health insurance at which point we can restart your medication 5. Call us if you have any worsening or severe symptoms  Overall I recommend:  1. Continue your other current medications 2. Return for follow-up in 4 months 3. Call us if you have any questions or concerns   ---------------------------------------------------------------  COVID-19 Vaccine Information can be found at: [email or web address] For questions related to vaccine distribution or appointments, please email [email or web address] or call [phone].   ---------------------------------------------------------------   At [facility] we value your feedback. You may receive a survey about your visit today. Please share your experience as we strive to create trusting relationships with our patients to provide genuine, compassionate, quality care.  We appreciate your understanding and patience as we review any laboratory studies, imaging, and other diagnostic tests that are ordered as we care for you. Our office policy is 5 business days for review of these results, and any emergent or urgent results are addressed in a timely manner for your best interest. If you do not hear from our office in 1 week, please contact us.   We also encourage the use of MyChart, which contains your medical information for your review as well. If you are not enrolled in this  feature, an access code is on this after visit summary for your convenience. Thank you for allowing [place] to be involved in your care.  It was great to see you today!  I hope you have a great Summer!!

## 2019-06-15 ENCOUNTER — Emergency Department (HOSPITAL_COMMUNITY)
Admission: EM | Admit: 2019-06-15 | Discharge: 2019-06-15 | Disposition: A | Discharge status: Home / Self Care | Payer: Self-pay | Attending: Emergency Medicine | Admitting: Emergency Medicine

## 2019-06-15 ENCOUNTER — Encounter (HOSPITAL_COMMUNITY): Payer: Self-pay

## 2019-06-15 ENCOUNTER — Other Ambulatory Visit: Payer: Self-pay

## 2019-06-15 ENCOUNTER — Emergency Department (HOSPITAL_COMMUNITY): Payer: Self-pay

## 2019-06-15 DIAGNOSIS — Y929 Unspecified place or not applicable: Secondary | ICD-10-CM | POA: Insufficient documentation

## 2019-06-15 DIAGNOSIS — S060X9A Concussion with loss of consciousness of unspecified duration, initial encounter: Secondary | ICD-10-CM

## 2019-06-15 DIAGNOSIS — E119 Type 2 diabetes mellitus without complications: Secondary | ICD-10-CM | POA: Insufficient documentation

## 2019-06-15 DIAGNOSIS — Z96651 Presence of right artificial knee joint: Secondary | ICD-10-CM | POA: Insufficient documentation

## 2019-06-15 DIAGNOSIS — Y999 Unspecified external cause status: Secondary | ICD-10-CM | POA: Insufficient documentation

## 2019-06-15 DIAGNOSIS — Y9389 Activity, other specified: Secondary | ICD-10-CM | POA: Insufficient documentation

## 2019-06-15 DIAGNOSIS — F17210 Nicotine dependence, cigarettes, uncomplicated: Secondary | ICD-10-CM | POA: Insufficient documentation

## 2019-06-15 DIAGNOSIS — Z79899 Other long term (current) drug therapy: Secondary | ICD-10-CM | POA: Insufficient documentation

## 2019-06-15 DIAGNOSIS — S0990XA Unspecified injury of head, initial encounter: Secondary | ICD-10-CM | POA: Insufficient documentation
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
## 2019-06-27 DIAGNOSIS — L03115 Cellulitis of right lower limb: Secondary | ICD-10-CM | POA: Diagnosis not present

## 2019-06-30 ENCOUNTER — Emergency Department (HOSPITAL_COMMUNITY)
Admission: EM | Admit: 2019-06-30 | Discharge: 2019-06-30 | Disposition: A | Discharge status: Home / Self Care | Payer: BLUE CROSS/BLUE SHIELD | Attending: Emergency Medicine | Admitting: Emergency Medicine

## 2019-06-30 ENCOUNTER — Encounter (HOSPITAL_COMMUNITY): Payer: Self-pay | Admitting: Emergency Medicine

## 2019-06-30 DIAGNOSIS — H5713 Ocular pain, bilateral: Secondary | ICD-10-CM | POA: Diagnosis not present

## 2019-06-30 DIAGNOSIS — Z5321 Procedure and treatment not carried out due to patient leaving prior to being seen by health care provider: Secondary | ICD-10-CM | POA: Insufficient documentation

## 2019-06-30 DIAGNOSIS — R519 Headache, unspecified: Secondary | ICD-10-CM | POA: Insufficient documentation
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
## 2019-07-01 ENCOUNTER — Emergency Department (HOSPITAL_COMMUNITY)
Admission: EM | Admit: 2019-07-01 | Discharge: 2019-07-01 | Disposition: A | Discharge status: Home / Self Care | Payer: BLUE CROSS/BLUE SHIELD | Attending: Emergency Medicine | Admitting: Emergency Medicine

## 2019-07-01 ENCOUNTER — Encounter (HOSPITAL_COMMUNITY): Payer: Self-pay | Admitting: Emergency Medicine

## 2019-07-01 DIAGNOSIS — Z96651 Presence of right artificial knee joint: Secondary | ICD-10-CM | POA: Diagnosis not present

## 2019-07-01 DIAGNOSIS — Z09 Encounter for follow-up examination after completed treatment for conditions other than malignant neoplasm: Secondary | ICD-10-CM | POA: Diagnosis not present

## 2019-07-01 DIAGNOSIS — F0781 Postconcussional syndrome: Secondary | ICD-10-CM | POA: Diagnosis not present

## 2019-07-01 DIAGNOSIS — E1165 Type 2 diabetes mellitus with hyperglycemia: Secondary | ICD-10-CM | POA: Insufficient documentation

## 2019-07-01 DIAGNOSIS — G44309 Post-traumatic headache, unspecified, not intractable: Secondary | ICD-10-CM | POA: Insufficient documentation

## 2019-07-01 DIAGNOSIS — F17210 Nicotine dependence, cigarettes, uncomplicated: Secondary | ICD-10-CM | POA: Insufficient documentation

## 2019-07-01 MED ORDER — DOXYCYCLINE HYCLATE 100 MG PO CAPS
100.0000 mg | ORAL_CAPSULE | Freq: Two times a day (BID) | ORAL | 0 refills | Status: DC
Start: 2019-07-01 — End: 2019-07-13

## 2019-07-01 MED ORDER — ONDANSETRON HCL 4 MG PO TABS
4.0000 mg | ORAL_TABLET | Freq: Four times a day (QID) | ORAL | 0 refills | Status: DC
Start: 2019-07-01 — End: 2019-07-13
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
## 2019-07-01 NOTE — ED Triage Notes (Signed)
Patient was diagnosed with concussion on 06-15-19, since 3 days after diagnosis, he has been having recurring symptoms listed on discharge papers.

## 2019-07-01 NOTE — Discharge Instructions (Signed)
Please take the following medications.  Your symptoms today are consistent with having a concussion  Tylenol every 6 hours as needed for pain, maximum of 500 mg per dose.  Because you cannot take anti-inflammatories this is the next best medication.  Zofran every 6 hours as needed for nausea  Doxycycline twice daily for 10 days to treat the infection in your leg.  Please follow-up with the neurologist listed above, Dr. Merlene Laughter, call the office for the next available appointment within the next week.  You should also be seen at your doctor's office for repeat evaluation of your leg within the next week as well.  If you develop severe or worsening symptoms return to the emergency department immediately.

## 2019-07-04 DIAGNOSIS — S060X9S Concussion with loss of consciousness of unspecified duration, sequela: Secondary | ICD-10-CM | POA: Diagnosis not present

## 2019-07-04 DIAGNOSIS — L03115 Cellulitis of right lower limb: Secondary | ICD-10-CM | POA: Diagnosis not present

## 2019-07-09 ENCOUNTER — Encounter: Payer: Self-pay | Admitting: Neurology

## 2019-07-09 ENCOUNTER — Ambulatory Visit: Payer: Self-pay | Admitting: Neurology

## 2019-07-09 DIAGNOSIS — F0781 Postconcussional syndrome: Secondary | ICD-10-CM | POA: Diagnosis not present

## 2019-07-09 DIAGNOSIS — L03115 Cellulitis of right lower limb: Secondary | ICD-10-CM | POA: Diagnosis not present

## 2019-07-13 ENCOUNTER — Other Ambulatory Visit: Payer: Self-pay

## 2019-07-13 ENCOUNTER — Encounter: Payer: Self-pay | Admitting: Neurology

## 2019-07-13 ENCOUNTER — Ambulatory Visit (INDEPENDENT_AMBULATORY_CARE_PROVIDER_SITE_OTHER): Payer: BLUE CROSS/BLUE SHIELD | Admitting: Neurology

## 2019-07-13 VITALS — BP 132/80 | HR 73 | Ht 74.0 in | Wt 200.3 lb

## 2019-07-13 DIAGNOSIS — S060X9S Concussion with loss of consciousness of unspecified duration, sequela: Secondary | ICD-10-CM

## 2019-07-13 DIAGNOSIS — Z87898 Personal history of other specified conditions: Secondary | ICD-10-CM

## 2019-07-13 DIAGNOSIS — H539 Unspecified visual disturbance: Secondary | ICD-10-CM | POA: Diagnosis not present

## 2019-07-13 DIAGNOSIS — R200 Anesthesia of skin: Secondary | ICD-10-CM

## 2019-07-13 DIAGNOSIS — R202 Paresthesia of skin: Secondary | ICD-10-CM

## 2019-07-13 NOTE — Patient Instructions (Addendum)
I believe the numbness in your feet is more of chronic nature.  It is often related to underlying diabetes, you have a prior history of diabetes, but you were able to lose quite a bit of weight.  You also have a history of excessive alcohol consumption which can also cause nerve damage, called neuropathy.  Please refrain from drinking alcohol altogether.  As discussed, I would like to review your latest blood work that you had through Dr. Juel Burrow office.  In April, one of the liver enzymes called AST was elevated.  I will also order some additional blood work today to see if there are any other treatable causes for nerve damage.  As discussed, we will also proceed with electrical nerve and muscle testing through our office, called EMG and nerve conduction velocity testing.  We will keep you posted as to the test results by phone call.  Otherwise, your neurological exam shows no acute findings.  Nevertheless, since you have had an episode of extended sleep and feeling off balance, we will proceed with a brain MRI with and without contrast, we will call you with the results.  For your vision symptoms with a spot in front of the right eye, I recommend that you get a formal examination with a eye doctor.  You have not seen an eye doctor in 3 or 4 years.  I also recommend that you see a dentist as soon as possible.  Please continue to work on smoking cessation and try to hydrate well with water.  I am glad to hear that your speech problem improved, I did not detect any problems with your speech.  Please continue to follow-up with your primary care physician.  We will arrange for follow-up appointment if needed after your tests.
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
## 2019-07-14 ENCOUNTER — Telehealth: Payer: Self-pay | Admitting: Neurology

## 2019-07-14 NOTE — Telephone Encounter (Signed)
LVM for pt to call back about scheduling mri  [employer] Auth: [id number] (exp. 07/14/19 to 01/09/20)

## 2019-07-14 NOTE — Telephone Encounter (Signed)
Pt has returned the call to [facility], please call

## 2019-07-15 NOTE — Telephone Encounter (Signed)
Pt has called to provide Raquel Sarna with the authorization # from [facility].  Pt advised Raquel Sarna already has that information.  Pt would like a call re: next step to get his MRI

## 2019-07-15 NOTE — Telephone Encounter (Signed)
Pt called again to schedule his MRI.

## 2019-07-16 NOTE — Progress Notes (Signed)
Labs showed one of the autoimmune antibodies to be positive, called ANA.  Sometimes this can be seen in patients with lupus or some autoimmune disease, it is a nonspecific finding but worth getting an opinion from a rheumatologist. If he is agreeable, please make a referral to rheumatology. He has a history of arthritis (per chart review) and may have seen a rheumatologist before.

## 2019-07-17 LAB — MULTIPLE MYELOMA PANEL, SERUM
Albumin SerPl Elph-Mcnc: 4.1 g/dL (ref 2.9–4.4)
Albumin/Glob SerPl: 1.3 (ref 0.7–1.7)
Alpha 1: 0.3 g/dL (ref 0.0–0.4)
Alpha2 Glob SerPl Elph-Mcnc: 0.5 g/dL (ref 0.4–1.0)
B-Globulin SerPl Elph-Mcnc: 1 g/dL (ref 0.7–1.3)
Gamma Glob SerPl Elph-Mcnc: 1.4 g/dL (ref 0.4–1.8)
Globulin, Total: 3.2 g/dL (ref 2.2–3.9)
IgA/Immunoglobulin A, Serum: 463 mg/dL — ABNORMAL HIGH (ref 90–386)
IgG (Immunoglobin G), Serum: 1178 mg/dL (ref 603–1613)
IgM (Immunoglobulin M), Srm: 116 mg/dL (ref 20–172)
Total Protein: 7.3 g/dL (ref 6.0–8.5)

## 2019-07-17 LAB — ENA+DNA/DS+SJORGEN'S
ENA RNP Ab: 5.9 AI — ABNORMAL HIGH (ref 0.0–0.9)
ENA SM Ab Ser-aCnc: <0.2 AI (ref 0.0–0.9)
ENA SSA (RO) Ab: <0.2 AI (ref 0.0–0.9)
ENA SSB (LA) Ab: <0.2 AI (ref 0.0–0.9)
dsDNA Ab: <1 IU/mL (ref 0–9)

## 2019-07-17 LAB — TSH: TSH: 4.06 u[IU]/mL (ref 0.450–4.500)

## 2019-07-17 LAB — HEAVY METALS PROFILE II, BLOOD
Arsenic: 7 ug/L (ref 2–23)
Cadmium: 1.6 ug/L — ABNORMAL HIGH (ref 0.0–1.2)
Lead, Blood: 2 ug/dL (ref 0–4)
Mercury: <1 ug/L (ref 0.0–14.9)

## 2019-07-17 LAB — SEDIMENTATION RATE: Sed Rate: 3 mm/hr (ref 0–30)

## 2019-07-17 LAB — VITAMIN B6: Vitamin B6: 25 ug/L (ref 5.3–46.7)

## 2019-07-17 LAB — B12 AND FOLATE PANEL
Folate: 5.2 ng/mL (ref >3.0)
Vitamin B-12: 905 pg/mL (ref 232–1245)

## 2019-07-17 LAB — RPR: RPR Ser Ql: NONREACTIVE

## 2019-07-17 LAB — ANA W/REFLEX: Anti Nuclear Antibody (ANA): POSITIVE — AB

## 2019-07-20 ENCOUNTER — Telehealth: Payer: Self-pay

## 2019-07-20 DIAGNOSIS — R899 Unspecified abnormal finding in specimens from other organs, systems and tissues: Secondary | ICD-10-CM

## 2019-07-20 NOTE — Telephone Encounter (Signed)
-----   Message from Star Age, MD sent at 07/16/2019  5:04 PM EDT ----- Labs showed one of the autoimmune antibodies to be positive, called ANA.  Sometimes this can be seen in patients with lupus or some autoimmune disease, it is a nonspecific finding but worth getting an opinion from a rheumatologist. If he is agreeable, please make a referral to rheumatology. He has a history of arthritis (per chart review) and may have seen a rheumatologist before.

## 2019-07-20 NOTE — Telephone Encounter (Signed)
I contacted the pt and advised of results. Pt verbalized understanding and is agreeable to rheumatology. I have placed the order for pt care coordinator to process.

## 2019-07-20 NOTE — Telephone Encounter (Signed)
Spoke to the patient he is scheduled at [facility] for 07/22/19.

## 2019-07-21 DIAGNOSIS — D3132 Benign neoplasm of left choroid: Secondary | ICD-10-CM | POA: Diagnosis not present

## 2019-07-21 DIAGNOSIS — H43391 Other vitreous opacities, right eye: Secondary | ICD-10-CM | POA: Diagnosis not present

## 2019-07-21 DIAGNOSIS — Z87820 Personal history of traumatic brain injury: Secondary | ICD-10-CM | POA: Diagnosis not present

## 2019-07-22 ENCOUNTER — Ambulatory Visit: Payer: BLUE CROSS/BLUE SHIELD

## 2019-07-22 ENCOUNTER — Telehealth: Payer: Self-pay | Admitting: Neurology

## 2019-07-22 ENCOUNTER — Other Ambulatory Visit: Payer: Self-pay

## 2019-07-22 DIAGNOSIS — H539 Unspecified visual disturbance: Secondary | ICD-10-CM

## 2019-07-22 DIAGNOSIS — S060X9S Concussion with loss of consciousness of unspecified duration, sequela: Secondary | ICD-10-CM

## 2019-07-22 DIAGNOSIS — R200 Anesthesia of skin: Secondary | ICD-10-CM

## 2019-07-22 DIAGNOSIS — Z87898 Personal history of other specified conditions: Secondary | ICD-10-CM

## 2019-07-22 MED ORDER — GADOBENATE DIMEGLUMINE 529 MG/ML IV SOLN
20.0000 mL | Freq: Once | INTRAVENOUS | Status: AC | PRN
Start: 2019-07-22 — End: 2019-07-22
  Administered 2019-07-22: 20 mL via INTRAVENOUS

## 2019-07-22 NOTE — Telephone Encounter (Signed)
I called pt's sister. No answer, left a message asking for her to call me back.

## 2019-07-22 NOTE — Telephone Encounter (Signed)
Patient is here today for MRI. Sister, Jeannene Patella, is in the lobby requesting a call back or a visit in the lobby from the nurse about issues she wants the doctor to be aware of. She is on the [facility]. Best call back is [phone]

## 2019-07-23 ENCOUNTER — Telehealth: Payer: Self-pay

## 2019-07-23 NOTE — Telephone Encounter (Signed)
I called pt's sister back . No answer, left a message asking pt to call me back. I advised pt's sister that our office would be closing today at 5 and would be closed on 7/2 and 7/5. I advised her to call me back with her concerns and that I would wait to hear from her.

## 2019-07-23 NOTE — Telephone Encounter (Signed)
-----   Message from Star Age, MD sent at 07/23/2019 12:45 PM EDT ----- Please call patient and advise him that his brain MRI with and without contrast was reported as normal.

## 2019-07-23 NOTE — Telephone Encounter (Signed)
I contacted the pt and lvm with results ( ok per dpr)  Pt was advised our office would be open until 5 pm today and then be closed until 07/28/2019. Pt was advised to call back if he had any questions.

## 2019-07-23 NOTE — Progress Notes (Signed)
Please call patient and advise him that his brain MRI with and without contrast was reported as normal.

## 2019-07-28 NOTE — Telephone Encounter (Signed)
I called the pt back and advised of result. Pt verbalized understanding and had no questions/concerns.

## 2019-07-28 NOTE — Telephone Encounter (Signed)
Pt is asking for a call with the results to his MRI

## 2019-07-29 ENCOUNTER — Encounter (INDEPENDENT_AMBULATORY_CARE_PROVIDER_SITE_OTHER): Payer: BLUE CROSS/BLUE SHIELD | Admitting: Neurology

## 2019-07-29 ENCOUNTER — Other Ambulatory Visit: Payer: Self-pay

## 2019-07-29 ENCOUNTER — Telehealth: Payer: Self-pay

## 2019-07-29 ENCOUNTER — Ambulatory Visit (INDEPENDENT_AMBULATORY_CARE_PROVIDER_SITE_OTHER): Payer: BLUE CROSS/BLUE SHIELD | Admitting: Neurology

## 2019-07-29 DIAGNOSIS — Z0289 Encounter for other administrative examinations: Secondary | ICD-10-CM

## 2019-07-29 DIAGNOSIS — R202 Paresthesia of skin: Secondary | ICD-10-CM

## 2019-07-29 DIAGNOSIS — R200 Anesthesia of skin: Secondary | ICD-10-CM

## 2019-07-29 DIAGNOSIS — S060X9S Concussion with loss of consciousness of unspecified duration, sequela: Secondary | ICD-10-CM

## 2019-07-29 DIAGNOSIS — Z87898 Personal history of other specified conditions: Secondary | ICD-10-CM

## 2019-07-29 DIAGNOSIS — H539 Unspecified visual disturbance: Secondary | ICD-10-CM

## 2019-07-29 NOTE — Procedures (Signed)
Full Name: Brandon Miller Gender: Male MRN #: [id number] Date of Birth: 11-Aug-1965    Visit Date: 07/29/2019 07:25 Age: 54 Years Examining Physician: Marcial Pacas, MD  Referring Physician: Star Age, MD Height: 6 feet 2 inch History: 54 year old male complains of 2 months history of bilateral feet paresthesia  Summary of the tests: Nerve conduction study: Bilateral sural, superficial peroneal sensory responses were normal. Bilateral tibial, peroneal to EDB motor responses were normal.  Electromyography: Selected needle examination of bilateral lower extremity muscles and bilateral lumbosacral paraspinal muscles were normal.  Conclusion: This is a normal study.  There is no electrodiagnostic evidence of large fiber peripheral neuropathy, or bilateral lumbar sacral radiculopathy.   ------------------------------- Marcial Pacas, M.D. PhD  [facility] [address], [place] [address] Tel: [phone] Fax: [phone]  Verbal informed consent was obtained from the patient, patient was informed of potential risk of procedure, including bruising, bleeding, hematoma formation, infection, muscle weakness, muscle pain, numbness, among others.         [place]    Nerve / Sites Muscle Latency Ref. Amplitude Ref. Rel Amp Segments Distance Velocity Ref. Area    ms ms mV mV %  cm m/s m/s mVms  L Peroneal - EDB     Ankle EDB 5.5 ?6.5 4.5 ?2.0 100 Ankle - EDB 9   13.7     Fib head EDB 12.8  4.0  88.3 Fib head - Ankle 32 44 ?44 13.2     Pop fossa EDB 15.1  4.0  100 Pop fossa - Fib head 10 44 ?44 13.3         Pop fossa - Ankle      R Peroneal - EDB     Ankle EDB 6.1 ?6.5 3.3 ?2.0 100 Ankle - EDB 9   13.2     Fib head EDB 13.4  2.7  82.9 Fib head - Ankle 32 44 ?44 12.1     Pop fossa EDB 15.7  2.7  98 Pop fossa - Fib head 10 44 ?44 12.2         Pop fossa - Ankle      L Tibial - AH     Ankle AH 4.4 ?5.8 6.9 ?4.0 100 Ankle - AH 9   22.8     Pop fossa AH 15.0  4.7   67.3 Pop fossa - Ankle 43 41 ?41 17.5  R Tibial - AH     Ankle AH 4.5 ?5.8 4.1 ?4.0 100 Ankle - AH 9   15.8     Pop fossa AH 15.3  3.6  88.2 Pop fossa - Ankle 44 41 ?41 13.5             SNC    Nerve / Sites Rec. Site Peak Lat Ref.  Amp Ref. Segments Distance    ms ms V V  cm  L Sural - Ankle (Calf)     Calf Ankle 4.4 ?4.4 9 ?6 Calf - Ankle 14  R Sural - Ankle (Calf)     Calf Ankle 4.3 ?4.4 8 ?6 Calf - Ankle 14  L Superficial peroneal - Ankle     Lat leg Ankle 3.1 ?4.4 8 ?6 Lat leg - Ankle 14  R Superficial peroneal - Ankle     Lat leg Ankle 4.4 ?4.4 7 ?6 Lat leg - Ankle 14             F  Wave  Nerve F Lat Ref.   ms ms  L Tibial - AH 61.5 ?56.0  R Tibial - AH 64.9 ?56.0         EMG Summary Table    Spontaneous MUAP Recruitment  Muscle IA Fib PSW Fasc Other Amp Dur. Poly Pattern  R. Tibialis anterior Normal None None None _______ Normal Normal Normal Normal  R. Tibialis posterior Normal None None None _______ Normal Normal Normal Normal  R. Peroneus longus Normal None None None _______ Normal Normal Normal Normal  R. Gastrocnemius (Medial head) Normal None None None _______ Normal Normal Normal Normal  R. Vastus lateralis Normal None None None _______ Normal Normal Normal Normal  L. Tibialis anterior Normal None None None _______ Normal Normal Normal Normal  L. Tibialis posterior Normal None None None _______ Normal Normal Normal Normal  L. Peroneus longus Normal None None None _______ Normal Normal Normal Normal  L. Vastus lateralis Normal None None None _______ Normal Normal Normal Normal  R. Abductor hallucis Normal None None None _______ Normal Normal Normal Normal  R. Lumbar paraspinals (mid) Normal None None None _______ Normal Normal Normal Normal  R. Lumbar paraspinals (low) Normal None None None _______ Normal Normal Normal Normal  L. Lumbar paraspinals (low) Normal None None None _______ Normal Normal Normal Normal  L. Lumbar paraspinals (mid) Normal None None  None _______ Normal Normal Normal Normal

## 2019-07-29 NOTE — Telephone Encounter (Signed)
Pt notified of results. He verbalized understanding and had no questions/concerns.

## 2019-07-29 NOTE — Progress Notes (Signed)
Please call and advise the patient that the recent EMG and nerve conduction velocity test, which is the electrical nerve and muscle test we we performed, was reported as within normal limits. We checked for abnormal electrical discharges in the muscles or nerves and the report suggested normal findings. No further action is required on this test at this time.  At this juncture, he can FU with me as needed.

## 2019-07-29 NOTE — Telephone Encounter (Signed)
-----   Message from Star Age, MD sent at 07/29/2019 12:51 PM EDT ----- Please call and advise the patient that the recent EMG and nerve conduction velocity test, which is the electrical nerve and muscle test we we performed, was reported as within normal limits. We checked for abnormal electrical discharges in the muscles or nerves and the report suggested normal findings. No further action is required on this test at this time.  At this juncture, he can FU with me as needed.

## 2019-07-31 DIAGNOSIS — F0781 Postconcussional syndrome: Secondary | ICD-10-CM | POA: Diagnosis not present

## 2019-07-31 DIAGNOSIS — R202 Paresthesia of skin: Secondary | ICD-10-CM | POA: Diagnosis not present

## 2019-07-31 DIAGNOSIS — F411 Generalized anxiety disorder: Secondary | ICD-10-CM | POA: Diagnosis not present

## 2019-08-06 ENCOUNTER — Telehealth: Payer: Self-pay | Admitting: Neurology

## 2019-08-06 NOTE — Telephone Encounter (Signed)
Created in error

## 2019-08-10 ENCOUNTER — Telehealth: Payer: Self-pay | Admitting: Neurology

## 2019-08-10 ENCOUNTER — Other Ambulatory Visit: Payer: Self-pay

## 2019-08-10 ENCOUNTER — Encounter (HOSPITAL_COMMUNITY): Payer: Self-pay | Admitting: *Deleted

## 2019-08-10 ENCOUNTER — Emergency Department (HOSPITAL_COMMUNITY): Payer: BLUE CROSS/BLUE SHIELD

## 2019-08-10 ENCOUNTER — Observation Stay (HOSPITAL_COMMUNITY)
Admission: EM | Admit: 2019-08-10 | Discharge: 2019-08-11 | Disposition: A | Discharge status: Home / Self Care | Payer: BLUE CROSS/BLUE SHIELD | Attending: Family Medicine | Admitting: Family Medicine

## 2019-08-10 DIAGNOSIS — F909 Attention-deficit hyperactivity disorder, unspecified type: Secondary | ICD-10-CM

## 2019-08-10 DIAGNOSIS — R296 Repeated falls: Secondary | ICD-10-CM | POA: Diagnosis present

## 2019-08-10 DIAGNOSIS — R202 Paresthesia of skin: Secondary | ICD-10-CM

## 2019-08-10 DIAGNOSIS — F419 Anxiety disorder, unspecified: Secondary | ICD-10-CM | POA: Diagnosis not present

## 2019-08-10 DIAGNOSIS — F17210 Nicotine dependence, cigarettes, uncomplicated: Secondary | ICD-10-CM | POA: Insufficient documentation

## 2019-08-10 DIAGNOSIS — R9431 Abnormal electrocardiogram [ECG] [EKG]: Secondary | ICD-10-CM | POA: Diagnosis not present

## 2019-08-10 DIAGNOSIS — F10239 Alcohol dependence with withdrawal, unspecified: Secondary | ICD-10-CM | POA: Diagnosis not present

## 2019-08-10 DIAGNOSIS — Z20822 Contact with and (suspected) exposure to covid-19: Secondary | ICD-10-CM | POA: Diagnosis not present

## 2019-08-10 DIAGNOSIS — Z96651 Presence of right artificial knee joint: Secondary | ICD-10-CM | POA: Diagnosis not present

## 2019-08-10 DIAGNOSIS — W19XXXA Unspecified fall, initial encounter: Secondary | ICD-10-CM | POA: Diagnosis present

## 2019-08-10 DIAGNOSIS — F1019 Alcohol abuse with unspecified alcohol-induced disorder: Secondary | ICD-10-CM | POA: Diagnosis present

## 2019-08-10 DIAGNOSIS — R2689 Other abnormalities of gait and mobility: Secondary | ICD-10-CM

## 2019-08-10 DIAGNOSIS — E1165 Type 2 diabetes mellitus with hyperglycemia: Secondary | ICD-10-CM | POA: Insufficient documentation

## 2019-08-10 DIAGNOSIS — R200 Anesthesia of skin: Secondary | ICD-10-CM

## 2019-08-10 DIAGNOSIS — F17200 Nicotine dependence, unspecified, uncomplicated: Secondary | ICD-10-CM | POA: Diagnosis present

## 2019-08-10 DIAGNOSIS — F32A Depression, unspecified: Secondary | ICD-10-CM | POA: Diagnosis present

## 2019-08-10 DIAGNOSIS — G459 Transient cerebral ischemic attack, unspecified: Principal | ICD-10-CM | POA: Insufficient documentation

## 2019-08-10 DIAGNOSIS — T887XXA Unspecified adverse effect of drug or medicament, initial encounter: Secondary | ICD-10-CM

## 2019-08-10 DIAGNOSIS — F10939 Alcohol use, unspecified with withdrawal, unspecified: Secondary | ICD-10-CM | POA: Diagnosis present

## 2019-08-10 DIAGNOSIS — F329 Major depressive disorder, single episode, unspecified: Secondary | ICD-10-CM

## 2019-08-10 LAB — HEPATIC FUNCTION PANEL
ALT: 45 U/L — ABNORMAL HIGH (ref 0–44)
AST: 79 U/L — ABNORMAL HIGH (ref 15–41)
Albumin: 4 g/dL (ref 3.5–5.0)
Alkaline Phosphatase: 98 U/L (ref 38–126)
Bilirubin, Direct: 0.2 mg/dL (ref 0.0–0.2)
Indirect Bilirubin: 0.8 mg/dL (ref 0.3–0.9)
Total Bilirubin: 1 mg/dL (ref 0.3–1.2)
Total Protein: 7.2 g/dL (ref 6.5–8.1)

## 2019-08-10 LAB — URINALYSIS, ROUTINE W REFLEX MICROSCOPIC
Bilirubin Urine: NEGATIVE
Glucose, UA: NEGATIVE mg/dL
Hgb urine dipstick: NEGATIVE
Ketones, ur: NEGATIVE mg/dL
Leukocytes,Ua: NEGATIVE
Nitrite: NEGATIVE
Protein, ur: NEGATIVE mg/dL
Specific Gravity, Urine: 1.002 — ABNORMAL LOW (ref 1.005–1.030)
pH: 6 (ref 5.0–8.0)

## 2019-08-10 LAB — BASIC METABOLIC PANEL
Anion gap: 9 (ref 5–15)
BUN: 6 mg/dL (ref 6–20)
CO2: 27 mmol/L (ref 22–32)
Calcium: 8.3 mg/dL — ABNORMAL LOW (ref 8.9–10.3)
Chloride: 98 mmol/L (ref 98–111)
Creatinine, Ser: 0.91 mg/dL (ref 0.61–1.24)
GFR calc Af Amer: >60 mL/min (ref >60)
GFR calc non Af Amer: >60 mL/min (ref >60)
Glucose, Bld: 82 mg/dL (ref 70–99)
Potassium: 3.6 mmol/L (ref 3.5–5.1)
Sodium: 134 mmol/L — ABNORMAL LOW (ref 135–145)

## 2019-08-10 LAB — CBC WITH DIFFERENTIAL/PLATELET
Abs Immature Granulocytes: 0.01 10*3/uL (ref 0.00–0.07)
Basophils Absolute: 0 10*3/uL (ref 0.0–0.1)
Basophils Relative: 1 %
Eosinophils Absolute: 0.1 10*3/uL (ref 0.0–0.5)
Eosinophils Relative: 1 %
HCT: 43 % (ref 39.0–52.0)
Hemoglobin: 15 g/dL (ref 13.0–17.0)
Immature Granulocytes: 0 %
Lymphocytes Relative: 21 %
Lymphs Abs: 1.2 10*3/uL (ref 0.7–4.0)
MCH: 33.6 pg (ref 26.0–34.0)
MCHC: 34.9 g/dL (ref 30.0–36.0)
MCV: 96.4 fL (ref 80.0–100.0)
Monocytes Absolute: 0.5 10*3/uL (ref 0.1–1.0)
Monocytes Relative: 8 %
Neutro Abs: 3.9 10*3/uL (ref 1.7–7.7)
Neutrophils Relative %: 69 %
Platelets: 86 10*3/uL — ABNORMAL LOW (ref 150–400)
RBC: 4.46 MIL/uL (ref 4.22–5.81)
RDW: 12.3 % (ref 11.5–15.5)
WBC: 5.6 10*3/uL (ref 4.0–10.5)
nRBC: 0 % (ref 0.0–0.2)

## 2019-08-10 LAB — GLUCOSE, CAPILLARY: Glucose-Capillary: 85 mg/dL (ref 70–99)

## 2019-08-10 LAB — MAGNESIUM: Magnesium: 2.2 mg/dL (ref 1.7–2.4)

## 2019-08-10 LAB — PROTIME-INR
INR: 1 (ref 0.8–1.2)
Prothrombin Time: 12.6 seconds (ref 11.4–15.2)

## 2019-08-10 LAB — VITAMIN B12: Vitamin B-12: 395 pg/mL (ref 180–914)

## 2019-08-10 LAB — SARS CORONAVIRUS 2 BY RT PCR (HOSPITAL ORDER, PERFORMED IN ~~LOC~~ HOSPITAL LAB): SARS Coronavirus 2: NEGATIVE

## 2019-08-10 LAB — PHOSPHORUS: Phosphorus: 4.1 mg/dL (ref 2.5–4.6)

## 2019-08-10 MED ORDER — FOLIC ACID 5 MG/ML IJ SOLN
INTRAMUSCULAR | Status: AC
  Filled 2019-08-10: qty 0.2

## 2019-08-10 MED ORDER — NICOTINE 14 MG/24HR TD PT24
14.0000 mg | MEDICATED_PATCH | Freq: Every day | TRANSDERMAL | Status: DC
  Administered 2019-08-10 – 2019-08-11 (×2): 14 mg via TRANSDERMAL
  Filled 2019-08-10 (×2): qty 1

## 2019-08-10 MED ORDER — LORAZEPAM 2 MG/ML IJ SOLN: 1.0000 mg | INTRAMUSCULAR | Status: DC | PRN

## 2019-08-10 MED ORDER — ACETAMINOPHEN 325 MG PO TABS
650.0000 mg | ORAL_TABLET | Freq: Four times a day (QID) | ORAL | Status: DC | PRN
  Administered 2019-08-10: 650 mg via ORAL
  Filled 2019-08-10: qty 2

## 2019-08-10 MED ORDER — FOLIC ACID 1 MG PO TABS
1.0000 mg | ORAL_TABLET | Freq: Every day | ORAL | Status: DC
  Administered 2019-08-11: 1 mg via ORAL
  Filled 2019-08-10: qty 1

## 2019-08-10 MED ORDER — ENOXAPARIN SODIUM 40 MG/0.4ML ~~LOC~~ SOLN
40.0000 mg | SUBCUTANEOUS | Status: DC
  Administered 2019-08-10: 40 mg via SUBCUTANEOUS
  Filled 2019-08-10: qty 0.4

## 2019-08-10 MED ORDER — POLYETHYLENE GLYCOL 3350 17 G PO PACK: 17.0000 g | PACK | Freq: Every day | ORAL | Status: DC | PRN

## 2019-08-10 MED ORDER — M.V.I. ADULT IV INJ
INJECTION | INTRAVENOUS | Status: AC
  Filled 2019-08-10: qty 10

## 2019-08-10 MED ORDER — ONDANSETRON HCL 4 MG PO TABS: 4.0000 mg | ORAL_TABLET | Freq: Four times a day (QID) | ORAL | Status: DC | PRN

## 2019-08-10 MED ORDER — CLONAZEPAM 0.5 MG PO TABS
1.0000 mg | ORAL_TABLET | Freq: Two times a day (BID) | ORAL | Status: DC
  Administered 2019-08-10 – 2019-08-11 (×2): 1 mg via ORAL
  Filled 2019-08-10 (×2): qty 2

## 2019-08-10 MED ORDER — ACETAMINOPHEN 650 MG RE SUPP: 650.0000 mg | Freq: Four times a day (QID) | RECTAL | Status: DC | PRN

## 2019-08-10 MED ORDER — THIAMINE HCL 100 MG PO TABS
100.0000 mg | ORAL_TABLET | Freq: Every day | ORAL | Status: DC
  Administered 2019-08-11: 100 mg via ORAL
  Filled 2019-08-10: qty 1

## 2019-08-10 MED ORDER — THIAMINE HCL 100 MG/ML IJ SOLN
Freq: Once | INTRAVENOUS | Status: AC
  Filled 2019-08-10: qty 1000

## 2019-08-10 MED ORDER — THIAMINE HCL 100 MG/ML IJ SOLN
INTRAMUSCULAR | Status: AC
  Filled 2019-08-10: qty 2

## 2019-08-10 MED ORDER — THIAMINE HCL 100 MG/ML IJ SOLN: 100.0000 mg | Freq: Every day | INTRAMUSCULAR | Status: DC

## 2019-08-10 MED ORDER — AMPHETAMINE-DEXTROAMPHETAMINE 10 MG PO TABS
15.0000 mg | ORAL_TABLET | Freq: Two times a day (BID) | ORAL | Status: DC
  Administered 2019-08-11 (×2): 15 mg via ORAL
  Filled 2019-08-10: qty 2
  Filled 2019-08-10 (×3): qty 1
  Filled 2019-08-10: qty 2
  Filled 2019-08-10: qty 1

## 2019-08-10 MED ORDER — LORAZEPAM 1 MG PO TABS: 1.0000 mg | ORAL_TABLET | ORAL | Status: DC | PRN

## 2019-08-10 MED ORDER — ONDANSETRON HCL 4 MG/2ML IJ SOLN: 4.0000 mg | Freq: Four times a day (QID) | INTRAMUSCULAR | Status: DC | PRN

## 2019-08-10 MED ORDER — ADULT MULTIVITAMIN W/MINERALS CH
1.0000 | ORAL_TABLET | Freq: Every day | ORAL | Status: DC
  Administered 2019-08-11: 1 via ORAL
  Filled 2019-08-10: qty 1

## 2019-08-10 MED ORDER — SODIUM CHLORIDE 0.45 % IV SOLN: INTRAVENOUS | Status: DC
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
## 2019-08-10 NOTE — Telephone Encounter (Signed)
Pt is asking for a call from RN to discuss an episode he had yesterday of multiple falls

## 2019-08-10 NOTE — ED Notes (Signed)
EKG handed to Dr. Zammit 

## 2019-08-10 NOTE — ED Triage Notes (Signed)
Over the past 3 weeks, he has had episodes of slurred speech and weakness. Last episode yesterday at 9pm,called a neurologist today and was advised to come in for evaluation.

## 2019-08-10 NOTE — Telephone Encounter (Signed)
I reached out to the pt and advised of Dr. Rexene Alberts recommendation. Pt will proceed to the ED.
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
## 2019-08-10 NOTE — Telephone Encounter (Signed)
Given new onset of slurring of speech and recurrent falls I would recommend that the patient proceed to the emergency room immediately.  Please call patient back.  He is advised to have someone take him or call 911, he may have had a stroke or TIA.  It is imperative that he get checked out immediately in my opinion.

## 2019-08-10 NOTE — Telephone Encounter (Signed)
I called the pt.  He reports on 08/09/2019 he was helping a friend work on his car and his friend noticed he was slurring his speech. Later on in the day the pt was unsteady on his feet and fell about 20 times while in a field walking his dog(no sustained injuries).  Pt wanted to know if MD thought his MRI should be repeated given this new event? Pt reports he is back to his baseline today, I advised if similar sx were to happen in the future to be evaluated by ER. Pt verbalized understanding.   Pt is waiting for an appt with Rheumatology ( first available date offered to the pt was Nov). Pt reports the numbness/tingling in his hands and feet is still present.   Pt wanted to know if Dr. Rexene Alberts had any other recommendations?
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
## 2019-08-10 NOTE — Telephone Encounter (Signed)
I have sent patient's referral to [facility] for a sooner and [facility] as well to see if he can get a sooner apt . Patient is aware . Patient states he is having numbness and tingling in both feet.

## 2019-08-11 DIAGNOSIS — F10239 Alcohol dependence with withdrawal, unspecified: Secondary | ICD-10-CM | POA: Diagnosis not present

## 2019-08-11 DIAGNOSIS — R2689 Other abnormalities of gait and mobility: Secondary | ICD-10-CM | POA: Diagnosis not present

## 2019-08-11 DIAGNOSIS — F1019 Alcohol abuse with unspecified alcohol-induced disorder: Secondary | ICD-10-CM | POA: Diagnosis not present

## 2019-08-11 DIAGNOSIS — F909 Attention-deficit hyperactivity disorder, unspecified type: Secondary | ICD-10-CM | POA: Diagnosis not present

## 2019-08-11 LAB — HIV ANTIBODY (ROUTINE TESTING W REFLEX): HIV Screen 4th Generation wRfx: NONREACTIVE

## 2019-08-11 MED ORDER — THIAMINE HCL 100 MG PO TABS: 100.0000 mg | ORAL_TABLET | Freq: Every day | ORAL | 3 refills | Status: DC

## 2019-08-11 MED ORDER — FOLIC ACID 1 MG PO TABS: 1.0000 mg | ORAL_TABLET | Freq: Every day | ORAL | 3 refills | Status: DC

## 2019-08-11 MED ORDER — ADULT MULTIVITAMIN W/MINERALS CH: 1.0000 | ORAL_TABLET | Freq: Every day | ORAL | 3 refills | Status: DC

## 2019-08-11 MED ORDER — NICOTINE 14 MG/24HR TD PT24: 14.0000 mg | MEDICATED_PATCH | Freq: Every day | TRANSDERMAL | 0 refills | Status: DC

## 2019-08-11 MED ORDER — ONDANSETRON HCL 4 MG PO TABS: 4.0000 mg | ORAL_TABLET | Freq: Four times a day (QID) | ORAL | 0 refills | Status: DC | PRN

## 2019-08-11 NOTE — TOC Initial Note (Signed)
Transition of Care [facility]) - Initial/Assessment Note    Patient Details  Name: Brandon Miller MRN: [id number] Date of Birth: [birth year]  Transition of [facility]) CM/SW Contact:    Ihor Gully, LCSW Phone Number: 08/11/2019, 1:20 PM  Clinical Narrative:                 Patient referred to [facility] for ETOH/SA resources. Patient is agreeable to receiving resources. [place] provided SA resources and AA meeting schedule. Patient advised that TOC would assist in appointment scheduling if needed.  TOC signing off.    Expected Discharge Plan: Home/Self Care Barriers to Discharge: No Barriers Identified   Patient Goals and CMS Choice Patient states their goals for this hospitalization and ongoing recovery are:: return home      Expected Discharge Plan and Services Expected Discharge Plan: Home/Self Care   Discharge Planning Services: Other - See comment (SA resources)   Living arrangements for the past 2 months: Single Family Home Expected Discharge Date: 08/11/19                                    Prior Living Arrangements/Services Living arrangements for the past 2 months: [place]   Patient language and need for interpreter reviewed:: Yes        Need for Family Participation in Patient Care: Yes (Comment) Care giver support system in place?: Yes (comment)   Criminal Activity/Legal Involvement Pertinent to Current Situation/Hospitalization: No - Comment as needed  Activities of Daily Living Home Assistive Devices/Equipment: None ADL Screening (condition at time of admission) Patient's cognitive ability adequate to safely complete daily activities?: Yes Is the patient deaf or have difficulty hearing?: No Does the patient have difficulty seeing, even when wearing glasses/contacts?: No Does the patient have difficulty concentrating, remembering, or making decisions?: No Patient able to express need for assistance with ADLs?: Yes Does the patient have  difficulty dressing or bathing?: No Independently performs ADLs?: Yes (appropriate for developmental age) Does the patient have difficulty walking or climbing stairs?: No Weakness of Legs: None Weakness of Arms/Hands: None  Permission Sought/Granted                  Emotional Assessment Appearance:: Appears stated age   Affect (typically observed): Appropriate Orientation: : Oriented to Self, Oriented to Place, Oriented to  Time, Oriented to Situation Alcohol / Substance Use: Alcohol Use Psych Involvement: No (comment)  Admission diagnosis:  TIA (transient ischemic attack) [G45.9] Falls [W19.XXXA] Patient Active Problem List   Diagnosis Date Noted  . Falls 08/10/2019  . Numbness and tingling of upper and lower extremities of both sides 08/10/2019  . Balance problem 08/10/2019  . Medication side effect 08/10/2019  . Alcohol withdrawal ([place]) 08/10/2019  . GERD (gastroesophageal reflux disease) 02/06/2019  . Attention deficit hyperactivity disorder (ADHD) 11/17/2018  . Tobacco dependence 11/17/2018  . Alcohol use disorder, moderate, dependence ([place]) 11/17/2018  . History of diet-controlled diabetes 11/17/2018  . Elevated LFTs 11/05/2018  . Acute gastroenteritis 10/31/2018  . Sleep apnea   . Generalized abdominal pain   . Non-intractable vomiting   . Diarrhea   . ETOH abuse   . Ileus, unspecified ([place]) 10/29/2018  . Severe recurrent major depression without psychotic features ([place]) 08/15/2018  . Alcohol withdrawal seizure with perceptual disturbance ([place])   . Alcohol abuse with unspecified alcohol-induced disorder ([place]) 02/05/2018  . Transaminasemia 02/05/2018  . Anxiety and depression   .  Alcohol withdrawal seizure without complication ([place]) 51/07/1250  . DJD (degenerative joint disease) of knee 08/25/2014  . Diabetes mellitus, type 2 ([place]) 10/31/2011  . Hyperglycemia 10/31/2011  . PNA (pneumonia) 10/30/2011  . LEG PAIN, RIGHT 01/20/2010   PCP:  Celene Squibb,  MD Pharmacy:   [id number] - [place], [place] - [address] [place] [address] Phone: [phone] Fax: [phone]  [id number] - [place], [place] [address] [place] [address] Phone: [phone] Fax: [phone]     Social Determinants of Health (SDOH) Interventions    Readmission Risk Interventions No flowsheet data found.

## 2019-08-11 NOTE — Discharge Instructions (Signed)
1) outpatient or inpatient alcohol rehab program strongly advised 2) abstinence from tobacco advised--- okay to use over-the-counter nicotine patch to help you quit smoking 3) patient follow-up with your neurologist advised-- Dr. Star Age, MD--- for further neurological work-up regarding your gait concerns 4)Avoid ibuprofen/Advil/Aleve/Motrin/Goody Powders/Naproxen/BC powders/Meloxicam/Diclofenac/Indomethacin and other Nonsteroidal anti-inflammatory medications as these will make you more likely to bleed and can cause stomach ulcers, can also cause Kidney problems.
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
## 2019-08-11 NOTE — Plan of Care (Signed)

## 2019-08-11 NOTE — Progress Notes (Signed)
  CT head  IMPRESSION: No evidence of acute intracranial hemorrhage or infarct.  Advanced parenchymal atrophic changes. Correlation for neuro degenerative risk factors (vascular disease, toxin exposure, etc.) May be helpful for further management.   Further outpatient Neuro w/u strongly advised---needs to see his neurologist  Roxan Hockey, MD

## 2019-08-12 NOTE — Telephone Encounter (Signed)
Will monitor the schedule no openings at this current time.

## 2019-08-12 NOTE — Telephone Encounter (Signed)
Pt admitted to [facility] on Monday. Pt called to schedule an appt with the Dr. Rexene Alberts as instructed at hospital discharge. l scheduled appt. Informed Pt will send message to nurse to see if she could work him in early then scheduled appt 9/14.

## 2019-08-14 LAB — VITAMIN B1: Vitamin B1 (Thiamine): 88.1 nmol/L (ref 66.5–200.0)

## 2019-08-20 ENCOUNTER — Other Ambulatory Visit: Payer: Self-pay

## 2019-08-20 ENCOUNTER — Emergency Department (HOSPITAL_COMMUNITY)
Admission: EM | Admit: 2019-08-20 | Discharge: 2019-08-20 | Disposition: A | Discharge status: Home / Self Care | Payer: BLUE CROSS/BLUE SHIELD | Attending: Emergency Medicine | Admitting: Emergency Medicine

## 2019-08-20 ENCOUNTER — Encounter (HOSPITAL_COMMUNITY): Payer: Self-pay

## 2019-08-20 ENCOUNTER — Emergency Department (HOSPITAL_COMMUNITY): Payer: BLUE CROSS/BLUE SHIELD

## 2019-08-20 DIAGNOSIS — Z96651 Presence of right artificial knee joint: Secondary | ICD-10-CM | POA: Diagnosis not present

## 2019-08-20 DIAGNOSIS — R112 Nausea with vomiting, unspecified: Secondary | ICD-10-CM | POA: Diagnosis not present

## 2019-08-20 DIAGNOSIS — F17210 Nicotine dependence, cigarettes, uncomplicated: Secondary | ICD-10-CM | POA: Insufficient documentation

## 2019-08-20 DIAGNOSIS — J0120 Acute ethmoidal sinusitis, unspecified: Secondary | ICD-10-CM | POA: Diagnosis not present

## 2019-08-20 DIAGNOSIS — R42 Dizziness and giddiness: Secondary | ICD-10-CM | POA: Diagnosis not present

## 2019-08-20 DIAGNOSIS — E119 Type 2 diabetes mellitus without complications: Secondary | ICD-10-CM | POA: Insufficient documentation

## 2019-08-20 DIAGNOSIS — G319 Degenerative disease of nervous system, unspecified: Secondary | ICD-10-CM | POA: Diagnosis not present

## 2019-08-20 DIAGNOSIS — J322 Chronic ethmoidal sinusitis: Secondary | ICD-10-CM | POA: Diagnosis not present

## 2019-08-20 DIAGNOSIS — R079 Chest pain, unspecified: Secondary | ICD-10-CM | POA: Diagnosis not present

## 2019-08-20 DIAGNOSIS — R202 Paresthesia of skin: Secondary | ICD-10-CM | POA: Insufficient documentation

## 2019-08-20 DIAGNOSIS — R110 Nausea: Secondary | ICD-10-CM

## 2019-08-20 DIAGNOSIS — R531 Weakness: Secondary | ICD-10-CM | POA: Insufficient documentation

## 2019-08-20 DIAGNOSIS — R21 Rash and other nonspecific skin eruption: Secondary | ICD-10-CM | POA: Diagnosis not present

## 2019-08-20 DIAGNOSIS — J3489 Other specified disorders of nose and nasal sinuses: Secondary | ICD-10-CM | POA: Diagnosis not present

## 2019-08-20 LAB — URINALYSIS, ROUTINE W REFLEX MICROSCOPIC
Bilirubin Urine: NEGATIVE
Glucose, UA: NEGATIVE mg/dL
Hgb urine dipstick: NEGATIVE
Ketones, ur: NEGATIVE mg/dL
Leukocytes,Ua: NEGATIVE
Nitrite: NEGATIVE
Protein, ur: NEGATIVE mg/dL
Specific Gravity, Urine: 1.001 — ABNORMAL LOW (ref 1.005–1.030)
pH: 6 (ref 5.0–8.0)

## 2019-08-20 LAB — COMPREHENSIVE METABOLIC PANEL
ALT: 55 U/L — ABNORMAL HIGH (ref 0–44)
AST: 113 U/L — ABNORMAL HIGH (ref 15–41)
Albumin: 4.1 g/dL (ref 3.5–5.0)
Alkaline Phosphatase: 97 U/L (ref 38–126)
Anion gap: 11 (ref 5–15)
BUN: 5 mg/dL — ABNORMAL LOW (ref 6–20)
CO2: 22 mmol/L (ref 22–32)
Calcium: 8.5 mg/dL — ABNORMAL LOW (ref 8.9–10.3)
Chloride: 101 mmol/L (ref 98–111)
Creatinine, Ser: 0.82 mg/dL (ref 0.61–1.24)
GFR calc Af Amer: >60 mL/min (ref >60)
GFR calc non Af Amer: >60 mL/min (ref >60)
Glucose, Bld: 90 mg/dL (ref 70–99)
Potassium: 3.7 mmol/L (ref 3.5–5.1)
Sodium: 134 mmol/L — ABNORMAL LOW (ref 135–145)
Total Bilirubin: 1.3 mg/dL — ABNORMAL HIGH (ref 0.3–1.2)
Total Protein: 7.3 g/dL (ref 6.5–8.1)

## 2019-08-20 LAB — CBC
HCT: 46.7 % (ref 39.0–52.0)
Hemoglobin: 16.4 g/dL (ref 13.0–17.0)
MCH: 33.7 pg (ref 26.0–34.0)
MCHC: 35.1 g/dL (ref 30.0–36.0)
MCV: 96.1 fL (ref 80.0–100.0)
Platelets: UNDETERMINED 10*3/uL (ref 150–400)
RBC: 4.86 MIL/uL (ref 4.22–5.81)
RDW: 12 % (ref 11.5–15.5)
WBC: 4 10*3/uL (ref 4.0–10.5)
nRBC: 0 % (ref 0.0–0.2)

## 2019-08-20 LAB — RAPID URINE DRUG SCREEN, HOSP PERFORMED
Amphetamines: NOT DETECTED
Barbiturates: NOT DETECTED
Benzodiazepines: NOT DETECTED
Cocaine: NOT DETECTED
Opiates: NOT DETECTED
Tetrahydrocannabinol: NOT DETECTED

## 2019-08-20 LAB — TROPONIN I (HIGH SENSITIVITY)
Troponin I (High Sensitivity): <2 ng/L (ref <18)
Troponin I (High Sensitivity): <2 ng/L (ref <18)

## 2019-08-20 LAB — LIPASE, BLOOD: Lipase: 37 U/L (ref 11–51)

## 2019-08-20 LAB — ETHANOL: Alcohol, Ethyl (B): <10 mg/dL (ref <10)

## 2019-08-20 MED ORDER — ONDANSETRON 4 MG PO TBDP
4.0000 mg | ORAL_TABLET | Freq: Three times a day (TID) | ORAL | 0 refills | Status: DC | PRN
Start: 2019-08-20 — End: 2020-05-17

## 2019-08-20 MED ORDER — ACETAMINOPHEN 325 MG PO TABS
650.0000 mg | ORAL_TABLET | Freq: Once | ORAL | Status: AC
  Administered 2019-08-20: 650 mg via ORAL
  Filled 2019-08-20: qty 2

## 2019-08-20 MED ORDER — ONDANSETRON 4 MG PO TBDP
4.0000 mg | ORAL_TABLET | Freq: Once | ORAL | Status: AC | PRN
  Administered 2019-08-20: 4 mg via ORAL
  Filled 2019-08-20: qty 1

## 2019-08-20 MED ORDER — SODIUM CHLORIDE 0.9% FLUSH: 3.0000 mL | Freq: Once | INTRAVENOUS | Status: DC

## 2019-08-20 NOTE — ED Provider Notes (Signed)
[facility] DEPT Provider Note   CSN: [id number] Arrival date & time: 08/20/19  1047     History Chief Complaint  Patient presents with  . multiple complaints  . Nausea    Brandon Miller is a 54 y.o. male.  HPI  53 year old male, with PMH of EtOH use, diabetes, seizures, recent TIA diagnosed on 7/19, presents with complaints of nausea, dizziness, numbness and tingling to hands and feet.  He states that symptoms are similar to when he was admitted to [facility] on 7/19.  He does state some gait difficulty which she has had for weeks and worsened over the last 2 days.  He denies any new head injury or trauma.  He denies any vision changes, focal weakness.  Patient also complaining of chest pain which started when he arrived to the emergency room in the midsternal region, sharp, nonradiating.  He denies any associated shortness of breath.     Past Medical History:  Diagnosis Date  . Allergy   . Anemia   . Anxiety   . Arthritis    RA  . Complication of anesthesia    hard to wake up-was told he may have sleep apnea-never tested  . Depression   . Diabetes mellitus without complication ([place])    with weight no more problems  . Seizure ([place])    had one seizure of unknown etiology a few tears ago, none since and on no meds for them  . Sleep apnea   . Snores     Patient Active Problem List   Diagnosis Date Noted  . Falls 08/10/2019  . Numbness and tingling of upper and lower extremities of both sides 08/10/2019  . Balance problem 08/10/2019  . Medication side effect 08/10/2019  . Alcohol withdrawal ([place]) 08/10/2019  . GERD (gastroesophageal reflux disease) 02/06/2019  . Attention deficit hyperactivity disorder (ADHD) 11/17/2018  . Tobacco dependence 11/17/2018  . Alcohol use disorder, moderate, dependence ([place]) 11/17/2018  . History of diet-controlled diabetes 11/17/2018  . Elevated LFTs 11/05/2018  . Acute gastroenteritis 10/31/2018  .  Sleep apnea   . Generalized abdominal pain   . Non-intractable vomiting   . Diarrhea   . ETOH abuse   . Ileus, unspecified ([place]) 10/29/2018  . Severe recurrent major depression without psychotic features ([place]) 08/15/2018  . Alcohol withdrawal seizure with perceptual disturbance ([place])   . Alcohol abuse with unspecified alcohol-induced disorder ([place]) 02/05/2018  . Transaminasemia 02/05/2018  . Anxiety and depression   . Alcohol withdrawal seizure without complication ([place]) 42/59/5638  . DJD (degenerative joint disease) of knee 08/25/2014  . Diabetes mellitus, type 2 ([place]) 10/31/2011  . Hyperglycemia 10/31/2011  . PNA (pneumonia) 10/30/2011  . LEG PAIN, RIGHT 01/20/2010    Past Surgical History:  Procedure Laterality Date  . BIOPSY  02/12/2019   Procedure: BIOPSY;  Surgeon: Daneil Dolin, MD;  [facility];  Service: Endoscopy;;  stomach  . COLONOSCOPY WITH PROPOFOL N/A 02/12/2019   Procedure: COLONOSCOPY WITH PROPOFOL;  Surgeon: Daneil Dolin, MD;  [facility];  Service: Endoscopy;  Laterality: N/A;  10:30am  . ELBOW SURGERY Right 2010   Dr. Percell Miller  . ESOPHAGOGASTRODUODENOSCOPY (EGD) WITH PROPOFOL N/A 02/12/2019   Procedure: ESOPHAGOGASTRODUODENOSCOPY (EGD) WITH PROPOFOL;  Surgeon: Daneil Dolin, MD;  [facility];  Service: Endoscopy;  Laterality: N/A;  . HERNIA REPAIR     Right inguinal  . KNEE ARTHROSCOPY WITH MEDIAL MENISECTOMY Right 06/11/2013   Procedure:  RIGHT KNEE ARTHROSCOPY WITH MEDIAL MENISECTOMY AND CHONDROPLASTY;  Surgeon: Ninetta Lights, MD;  Location: [place];  Service: Orthopedics;  Laterality: Right;  Chondroplasty, loose bodies, medial menisectomy, lysis of adhesions.  Marland Kitchen KNEE SURGERY Right 2011  . POLYPECTOMY  02/12/2019   Procedure: POLYPECTOMY;  Surgeon: Daneil Dolin, MD;  [facility];  Service: Endoscopy;;  . right ankle    . SHOULDER SURGERY Right 2010   Dr. Percell Miller  . TOTAL KNEE  ARTHROPLASTY Right 08/25/2014   Procedure: TOTAL KNEE ARTHROPLASTY;  Surgeon: Ninetta Lights, MD;  Location: [place];  Service: Orthopedics;  Laterality: Right;       Family History  Problem Relation Age of Onset  . Hepatitis Mother   . Cirrhosis Mother        Did not drink  . Pneumonia Father   . Kidney failure Father   . Gastric cancer Brother        ? Gastric CA spread to esophagus  . Colon cancer Neg Hx     Social History   Tobacco Use  . Smoking status: Current Every Day Smoker    Packs/day: 0.50    Years: 25.00    Pack years: 12.50    Types: Cigarettes  . Smokeless tobacco: Never Used  Vaping Use  . Vaping Use: Never used  Substance Use Topics  . Alcohol use: Yes    Comment: occ  . Drug use: No    Home Medications Prior to Admission medications   Medication Sig Start Date End Date Taking? Authorizing Provider  amphetamine-dextroamphetamine (ADDERALL) 15 MG tablet Take 15 mg by mouth 2 (two) times daily.   Yes [provider]  clonazePAM (KLONOPIN) 1 MG tablet Take 1 mg by mouth 2 (two) times daily.   Yes [provider]  DULoxetine (CYMBALTA) 20 MG capsule Take 20 mg by mouth daily. Patient not taking: Reported on 08/20/2019 07/31/19   [provider]  folic acid (FOLVITE) 1 MG tablet Take 1 tablet (1 mg total) by mouth daily. Patient not taking: Reported on 08/20/2019 08/12/19   Roxan Hockey, MD  gabapentin (NEURONTIN) 100 MG capsule Take 100 mg by mouth 3 (three) times daily. Patient not taking: Reported on 08/20/2019 07/31/19   [provider]  Multiple Vitamin (MULTIVITAMIN WITH MINERALS) TABS tablet Take 1 tablet by mouth daily. Patient not taking: Reported on 08/20/2019 08/12/19   Roxan Hockey, MD  nicotine (NICODERM CQ - DOSED IN MG/24 HOURS) 14 mg/24hr patch Place 1 patch (14 mg total) onto the skin daily. Patient not taking: Reported on 08/20/2019 08/12/19   Roxan Hockey, MD  ondansetron (ZOFRAN ODT) 4 MG disintegrating  tablet Take 1 tablet (4 mg total) by mouth every 8 (eight) hours as needed for nausea or vomiting. 08/20/19   Dewight Catino S, PA-C  ondansetron (ZOFRAN) 4 MG tablet Take 1 tablet (4 mg total) by mouth every 6 (six) hours as needed for nausea. Patient not taking: Reported on 08/20/2019 08/11/19   Roxan Hockey, MD  thiamine 100 MG tablet Take 1 tablet (100 mg total) by mouth daily. Patient not taking: Reported on 08/20/2019 08/12/19   Roxan Hockey, MD    Allergies    Daypro [oxaprozin], Diclofenac sodium, Indocin [indomethacin], and Zanaflex [tizanidine hcl]  Review of Systems   Review of Systems  Constitutional: Negative for chills and fever.  Respiratory: Negative for shortness of breath.   Cardiovascular: Negative for chest pain.  Gastrointestinal: Positive for nausea and vomiting (  dry heaving). Negative for abdominal pain.  Neurological: Positive for dizziness and weakness (generalized). Negative for syncope.  All other systems reviewed and are negative.   Physical Exam Updated Vital Signs BP (!) 142/88   Pulse 50   Temp 98 F (36.7 C)   Resp 18   Ht 6\' 2"  (1.88 m)   Wt 88.2 kg   SpO2 100%   BMI 24.97 kg/m   Physical Exam Vitals and nursing note reviewed.  Constitutional:      Appearance: He is well-developed.  HENT:     Head: Normocephalic and atraumatic.  Eyes:     Conjunctiva/sclera: Conjunctivae normal.  Cardiovascular:     Rate and Rhythm: Normal rate and regular rhythm.     Heart sounds: Normal heart sounds. No murmur heard.   Pulmonary:     Effort: Pulmonary effort is normal. No respiratory distress.     Breath sounds: Normal breath sounds. No wheezing or rales.  Abdominal:     General: Bowel sounds are normal. There is no distension.     Palpations: Abdomen is soft.     Tenderness: There is no abdominal tenderness.  Musculoskeletal:        General: No tenderness or deformity. Normal range of motion.     Cervical back: Neck supple.  Skin:     General: Skin is warm and dry.     Findings: No erythema or rash.  Neurological:     General: No focal deficit present.     Mental Status: He is alert and oriented to person, place, and time.     Cranial Nerves: Cranial nerves are intact.     Sensory: Sensation is intact.     Motor: Motor function is intact.     Coordination: Coordination is intact.     Gait: Gait is intact.  Psychiatric:        Behavior: Behavior normal.     ED Results / Procedures / Treatments   Labs (all labs ordered are listed, but only abnormal results are displayed) Labs Reviewed  COMPREHENSIVE METABOLIC PANEL - Abnormal; Notable for the following components:      Result Value   Sodium 134 (*)    BUN 5 (*)    Calcium 8.5 (*)    AST 113 (*)    ALT 55 (*)    Total Bilirubin 1.3 (*)    All other components within normal limits  URINALYSIS, ROUTINE W REFLEX MICROSCOPIC - Abnormal; Notable for the following components:   Color, Urine COLORLESS (*)    Specific Gravity, Urine 1.001 (*)    All other components within normal limits  LIPASE, BLOOD  CBC  ETHANOL  RAPID URINE DRUG SCREEN, HOSP PERFORMED  TROPONIN I (HIGH SENSITIVITY)  TROPONIN I (HIGH SENSITIVITY)    EKG EKG Interpretation  Date/Time:  Thursday August 20 2019 15:40:34 EDT Ventricular Rate:  51 PR Interval:    QRS Duration: 103 QT Interval:  511 QTC Calculation: 471 R Axis:   73 Text Interpretation: Sinus rhythm No significant change since last tracing Confirmed by Dorie Rank [phone]) on 08/20/2019 3:44:41 PM   Radiology DG Chest 2 View  Result Date: 08/20/2019 CLINICAL DATA:  54 year old male with chest pain. EXAM: CHEST - 2 VIEW COMPARISON:  None. FINDINGS: The heart size and mediastinal contours are within normal limits. Both lungs are clear. The visualized skeletal structures are unremarkable. IMPRESSION: No active cardiopulmonary disease. Electronically Signed   By: Anner Crete M.D.   On: 08/20/2019 18:51  CT Head Wo  Contrast  Result Date: 08/20/2019 CLINICAL DATA:  Dizziness, nonspecific. Additional provided: Patient reports nausea and numbness, tingling in hands and feet. EXAM: CT HEAD WITHOUT CONTRAST TECHNIQUE: Contiguous axial images were obtained from the base of the skull through the vertex without intravenous contrast. COMPARISON:  Head CT 08/10/2019, brain MRI 07/22/2019 FINDINGS: Brain: Stable, mild generalized parenchymal atrophy. There is no acute intracranial hemorrhage. No demarcated cortical infarct. No extra-axial fluid collection. No evidence of intracranial mass. No midline shift. Vascular: No hyperdense vessel. Skull: Normal. Negative for fracture or focal lesion. Sinuses/Orbits: Visualized orbits show no acute finding. Mild ethmoid sinus mucosal thickening. No significant mastoid effusion. IMPRESSION: No CT evidence of acute intracranial abnormality. Stable, mild generalized parenchymal atrophy. Mild ethmoid sinus mucosal thickening. Electronically Signed   By: Kellie Simmering DO   On: 08/20/2019 15:31    Procedures Procedures (including critical care time)  Medications Ordered in ED Medications  sodium chloride flush (NS) 0.9 % injection 3 mL ( Intravenous Canceled Entry 08/20/19 1345)  ondansetron (ZOFRAN-ODT) disintegrating tablet 4 mg (4 mg Oral Given 08/20/19 1114)  acetaminophen (TYLENOL) tablet 650 mg (650 mg Oral Given 08/20/19 1651)    ED Course  I have reviewed the triage vital signs and the nursing notes.  Pertinent labs & imaging results that were available during my care of the patient were reviewed by me and considered in my medical decision making (see chart for details).    MDM Rules/Calculators/A&P                          Presents with multiple complaints of nausea, dizziness, numbness, tingling.  He did feel that he had some gait difficulty.  All of the symptoms have been going on for greater than a week.  He was seen and evaluated for the symptoms and admitted at [facility].  He had a TIA work-up and was to follow-up with neurology.  He states that he has not followed up with them yet.  His neuro exam is nonfocal, nonlateralizing.  He is ambulatory with a steady gait.  Imaging unremarkable.  Blood work unremarkable.  Patient did endorse some chest pain on arrival and he has had two troponins which are negative, EKG shows no acute ST changes.  Heart score is two.  History and physical is not consistent with CVA, TIA, electrolyte abnormality, ACS.  Patient ready and stable for discharge.    At this time there does not appear to be any evidence of an acute emergency medical condition and the patient appears stable for discharge with appropriate outpatient follow up.Diagnosis was discussed with patient who verbalizes understanding and is agreeable to discharge.   Final Clinical Impression(s) / ED Diagnoses Final diagnoses:  Nausea  Dizziness    Rx / DC Orders ED Discharge Orders         Ordered    ondansetron (ZOFRAN ODT) 4 MG disintegrating tablet  Every 8 hours PRN     Discontinue  Reprint     08/20/19 1839           Rachel Moulds 08/20/19 2033    Dorie Rank, MD 08/21/19 1001

## 2019-08-20 NOTE — ED Notes (Signed)
Pt was able to ambulate in the hallway without assistance.

## 2019-08-20 NOTE — ED Notes (Signed)
Pt removed blood pressure cuff, pulse ox and cardiac leads.

## 2019-08-20 NOTE — ED Triage Notes (Signed)
Patient was seen for same symptoms at [facility] on the 19 th-today at work he started having nausea and numbness and tingling in his hands and feet-ETOH yesterday

## 2019-08-20 NOTE — Discharge Instructions (Signed)
Take Zofran as needed for nausea.  Drink plenty fluids.  Please follow-up with neurology as soon as possible for continued evaluation.  In the meantime please follow-up with your primary care provider in 2 days for continued valuation.  Return to the emergency room immediately for new or worsening symptoms or concerns, such as chest pain, shortness of breath, passing out, fevers or any concerns at all.

## 2019-08-25 ENCOUNTER — Telehealth: Payer: Self-pay | Admitting: Neurology

## 2019-08-25 NOTE — Telephone Encounter (Signed)
Pt's sister,Pam Nickola Major would like a call from the nurse to discuss the name of the Rheumatologist and location. Ms. Nickola Major do not have this information.

## 2019-08-25 NOTE — Telephone Encounter (Signed)
Spoke with sister who was concerned patientwill not bee seen by Shaili B. Estanislado Pandy, MD until Nov. I advised she get him on wait list; referral was placed as routine. I gave her the # to office. She expressed concern over not seeing Dr Rexene Alberts for FU sooner. I was able to move FU one week sooner. He is on wait list as well. She stated her brother is not the same since his incident, and she is uncertain how to help him with anxiety. I advised he discuss with PCP. She verbalized understanding, appreciation.

## 2019-08-27 DIAGNOSIS — R202 Paresthesia of skin: Secondary | ICD-10-CM | POA: Diagnosis not present

## 2019-08-27 DIAGNOSIS — R41840 Attention and concentration deficit: Secondary | ICD-10-CM | POA: Diagnosis not present

## 2019-08-27 DIAGNOSIS — F0781 Postconcussional syndrome: Secondary | ICD-10-CM | POA: Diagnosis not present

## 2019-08-27 DIAGNOSIS — F411 Generalized anxiety disorder: Secondary | ICD-10-CM | POA: Diagnosis not present

## 2019-09-14 DIAGNOSIS — F411 Generalized anxiety disorder: Secondary | ICD-10-CM | POA: Diagnosis not present

## 2019-09-14 DIAGNOSIS — F900 Attention-deficit hyperactivity disorder, predominantly inattentive type: Secondary | ICD-10-CM | POA: Diagnosis not present

## 2019-09-14 DIAGNOSIS — M109 Gout, unspecified: Secondary | ICD-10-CM | POA: Diagnosis not present

## 2019-09-14 DIAGNOSIS — E119 Type 2 diabetes mellitus without complications: Secondary | ICD-10-CM | POA: Diagnosis not present

## 2019-09-15 ENCOUNTER — Ambulatory Visit: Payer: Self-pay | Admitting: Nurse Practitioner

## 2019-09-15 ENCOUNTER — Encounter: Payer: Self-pay | Admitting: Nurse Practitioner

## 2019-09-29 ENCOUNTER — Ambulatory Visit: Payer: Self-pay | Admitting: Neurology

## 2019-09-29 ENCOUNTER — Telehealth: Payer: Self-pay

## 2019-09-29 ENCOUNTER — Encounter: Payer: Self-pay | Admitting: Neurology

## 2019-09-29 NOTE — Telephone Encounter (Signed)
Pt no showed 09/29/2019 f/u with Dr. Rexene Alberts.

## 2019-10-06 ENCOUNTER — Ambulatory Visit: Payer: BLUE CROSS/BLUE SHIELD | Admitting: Neurology

## 2019-11-30 NOTE — Progress Notes (Deleted)
Office Visit Note  Patient: Brandon Miller             Date of Birth: [birth year]           MRN: [id number]             PCP: Celene Squibb, MD Referring: Star Age, MD Visit Date: 12/10/2019 Occupation: @GUAROCC @  Subjective:  No chief complaint on file.   History of Present Illness: Brandon Miller is a 54 y.o. male ***   Activities of Daily Living:  Patient reports morning stiffness for *** {minute/hour:19697}.   Patient {ACTIONS;DENIES/REPORTS:21021675::"Denies"} nocturnal pain.  Difficulty dressing/grooming: {ACTIONS;DENIES/REPORTS:21021675::"Denies"} Difficulty climbing stairs: {ACTIONS;DENIES/REPORTS:21021675::"Denies"} Difficulty getting out of chair: {ACTIONS;DENIES/REPORTS:21021675::"Denies"} Difficulty using hands for taps, buttons, cutlery, and/or writing: {ACTIONS;DENIES/REPORTS:21021675::"Denies"}  No Rheumatology ROS completed.   PMFS History:  Patient Active Problem List   Diagnosis Date Noted  . Falls 08/10/2019  . Numbness and tingling of upper and lower extremities of both sides 08/10/2019  . Balance problem 08/10/2019  . Medication side effect 08/10/2019  . Alcohol withdrawal ([place]) 08/10/2019  . GERD (gastroesophageal reflux disease) 02/06/2019  . Attention deficit hyperactivity disorder (ADHD) 11/17/2018  . Tobacco dependence 11/17/2018  . Alcohol use disorder, moderate, dependence ([place]) 11/17/2018  . History of diet-controlled diabetes 11/17/2018  . Elevated LFTs 11/05/2018  . Acute gastroenteritis 10/31/2018  . Sleep apnea   . Generalized abdominal pain   . Non-intractable vomiting   . Diarrhea   . ETOH abuse   . Ileus, unspecified ([place]) 10/29/2018  . Severe recurrent major depression without psychotic features ([place]) 08/15/2018  . Alcohol withdrawal seizure with perceptual disturbance ([place])   . Alcohol abuse with unspecified alcohol-induced disorder ([place]) 02/05/2018  . Transaminasemia 02/05/2018  . Anxiety and depression   . Alcohol  withdrawal seizure without complication ([place]) 65/68/1275  . DJD (degenerative joint disease) of knee 08/25/2014  . Diabetes mellitus, type 2 ([place]) 10/31/2011  . Hyperglycemia 10/31/2011  . PNA (pneumonia) 10/30/2011  . LEG PAIN, RIGHT 01/20/2010    Past Medical History:  Diagnosis Date  . Allergy   . Anemia   . Anxiety   . Arthritis    RA  . Complication of anesthesia    hard to wake up-was told he may have sleep apnea-never tested  . Depression   . Diabetes mellitus without complication ([place])    with weight no more problems  . Seizure ([place])    had one seizure of unknown etiology a few tears ago, none since and on no meds for them  . Sleep apnea   . Snores     Family History  Problem Relation Age of Onset  . Hepatitis Mother   . Cirrhosis Mother        Did not drink  . Pneumonia Father   . Kidney failure Father   . Gastric cancer Brother        ? Gastric CA spread to esophagus  . Colon cancer Neg Hx    Past Surgical History:  Procedure Laterality Date  . BIOPSY  02/12/2019   Procedure: BIOPSY;  Surgeon: Daneil Dolin, MD;  [facility];  Service: Endoscopy;;  stomach  . COLONOSCOPY WITH PROPOFOL N/A 02/12/2019   Procedure: COLONOSCOPY WITH PROPOFOL;  Surgeon: Daneil Dolin, MD;  [facility];  Service: Endoscopy;  Laterality: N/A;  10:30am  . ELBOW SURGERY Right 2010   Dr. Percell Miller  . ESOPHAGOGASTRODUODENOSCOPY (EGD) WITH PROPOFOL N/A 02/12/2019   Procedure: ESOPHAGOGASTRODUODENOSCOPY (EGD) WITH  PROPOFOL;  Surgeon: Daneil Dolin, MD;  [facility];  Service: Endoscopy;  Laterality: N/A;  . HERNIA REPAIR     Right inguinal  . KNEE ARTHROSCOPY WITH MEDIAL MENISECTOMY Right 06/11/2013   Procedure: RIGHT KNEE ARTHROSCOPY WITH MEDIAL MENISECTOMY AND CHONDROPLASTY;  Surgeon: Ninetta Lights, MD;  Location: [place];  Service: Orthopedics;  Laterality: Right;  Chondroplasty, loose bodies, medial menisectomy, lysis of  adhesions.  Marland Kitchen KNEE SURGERY Right 2011  . POLYPECTOMY  02/12/2019   Procedure: POLYPECTOMY;  Surgeon: Daneil Dolin, MD;  [facility];  Service: Endoscopy;;  . right ankle    . SHOULDER SURGERY Right 2010   Dr. Percell Miller  . TOTAL KNEE ARTHROPLASTY Right 08/25/2014   Procedure: TOTAL KNEE ARTHROPLASTY;  Surgeon: Ninetta Lights, MD;  Location: [place];  Service: Orthopedics;  Laterality: Right;   Social History   Social History Narrative  . Not on file   Immunization History  Administered Date(s) Administered  . Hepatitis B, adult 08/10/2015  . Influenza,inj,Quad PF,6+ Mos 11/17/2018  . Pneumococcal Polysaccharide-23 11/17/2018     Objective: Vital Signs: There were no vitals taken for this visit.   Physical Exam   Musculoskeletal Exam: ***  CDAI Exam: CDAI Score: -- Patient Global: --; Provider Global: -- Swollen: --; Tender: -- Joint Exam 12/10/2019   No joint exam has been documented for this visit   There is currently no information documented on the homunculus. Go to the Rheumatology activity and complete the homunculus joint exam.  Investigation: No additional findings.  Imaging: No results found.  Recent Labs: Lab Results  Component Value Date   WBC 4.0 08/20/2019   HGB 16.4 08/20/2019   PLT PLATELET CLUMPS NOTED ON SMEAR, UNABLE TO ESTIMATE 08/20/2019   NA 134 (L) 08/20/2019   K 3.7 08/20/2019   CL 101 08/20/2019   CO2 22 08/20/2019   GLUCOSE 90 08/20/2019   BUN 5 (L) 08/20/2019   CREATININE 0.82 08/20/2019   BILITOT 1.3 (H) 08/20/2019   ALKPHOS 97 08/20/2019   AST 113 (H) 08/20/2019   ALT 55 (H) 08/20/2019   PROT 7.3 08/20/2019   ALBUMIN 4.1 08/20/2019   CALCIUM 8.5 (L) 08/20/2019   GFRAA >60 08/20/2019    Speciality Comments: No specialty comments available.  Procedures:  No procedures performed Allergies: Daypro [oxaprozin], Diclofenac sodium, Indocin [indomethacin], and Zanaflex [tizanidine hcl]   Assessment / Plan:     Visit  Diagnoses: Positive ANA (antinuclear antibody) - 07/13/19: ANA+, ESR 3, TSH 4.06, RPR-, folate 5.2, Vitamin B12 905, cadmium 1.6 (H), RNP 5.9, dsDNA<1, Ro and [place] negative, [place]-  History of gastroesophageal reflux (GERD)  Alcohol use disorder, moderate, dependence (HCC)  Alcohol withdrawal seizure with perceptual disturbance (HCC)  History of diet-controlled diabetes  Severe recurrent major depression without psychotic features ([place])  Orders: No orders of the defined types were placed in this encounter.  No orders of the defined types were placed in this encounter.   Face-to-face time spent with patient was *** minutes. Greater than 50% of time was spent in counseling and coordination of care.  Follow-Up Instructions: No follow-ups on file.   Ofilia Neas, PA-C  Note - This record has been created using Dragon software.  Chart creation errors have been sought, but may not always  have been located. Such creation errors do not reflect on  the standard of medical care.

## 2019-12-10 ENCOUNTER — Ambulatory Visit: Payer: BLUE CROSS/BLUE SHIELD | Admitting: Rheumatology

## 2019-12-10 DIAGNOSIS — F1020 Alcohol dependence, uncomplicated: Secondary | ICD-10-CM

## 2019-12-10 DIAGNOSIS — R768 Other specified abnormal immunological findings in serum: Secondary | ICD-10-CM

## 2019-12-10 DIAGNOSIS — F332 Major depressive disorder, recurrent severe without psychotic features: Secondary | ICD-10-CM

## 2019-12-10 DIAGNOSIS — Z8639 Personal history of other endocrine, nutritional and metabolic disease: Secondary | ICD-10-CM

## 2019-12-10 DIAGNOSIS — Z8719 Personal history of other diseases of the digestive system: Secondary | ICD-10-CM

## 2019-12-10 DIAGNOSIS — F10232 Alcohol dependence with withdrawal with perceptual disturbance: Secondary | ICD-10-CM

## 2019-12-31 ENCOUNTER — Ambulatory Visit: Payer: BLUE CROSS/BLUE SHIELD | Admitting: Rheumatology

## 2020-01-09 DIAGNOSIS — IMO0002 Reserved for concepts with insufficient information to code with codable children: Secondary | ICD-10-CM | POA: Insufficient documentation

## 2020-01-09 DIAGNOSIS — M109 Gout, unspecified: Secondary | ICD-10-CM | POA: Insufficient documentation

## 2020-01-09 DIAGNOSIS — E1165 Type 2 diabetes mellitus with hyperglycemia: Secondary | ICD-10-CM | POA: Insufficient documentation

## 2020-01-09 DIAGNOSIS — M1990 Unspecified osteoarthritis, unspecified site: Secondary | ICD-10-CM | POA: Insufficient documentation

## 2020-01-21 ENCOUNTER — Emergency Department (HOSPITAL_COMMUNITY)
Admission: EM | Admit: 2020-01-21 | Discharge: 2020-01-21 | Disposition: A | Discharge status: Home / Self Care | Payer: BLUE CROSS/BLUE SHIELD | Attending: Emergency Medicine | Admitting: Emergency Medicine

## 2020-01-21 ENCOUNTER — Emergency Department (HOSPITAL_COMMUNITY): Payer: BLUE CROSS/BLUE SHIELD

## 2020-01-21 ENCOUNTER — Encounter (HOSPITAL_COMMUNITY): Payer: Self-pay | Admitting: Emergency Medicine

## 2020-01-21 ENCOUNTER — Other Ambulatory Visit: Payer: Self-pay

## 2020-01-21 DIAGNOSIS — S0990XA Unspecified injury of head, initial encounter: Secondary | ICD-10-CM | POA: Diagnosis present

## 2020-01-21 DIAGNOSIS — Z96651 Presence of right artificial knee joint: Secondary | ICD-10-CM | POA: Insufficient documentation

## 2020-01-21 DIAGNOSIS — M542 Cervicalgia: Secondary | ICD-10-CM | POA: Insufficient documentation

## 2020-01-21 DIAGNOSIS — S065X9A Traumatic subdural hemorrhage with loss of consciousness of unspecified duration, initial encounter: Secondary | ICD-10-CM

## 2020-01-21 DIAGNOSIS — M25511 Pain in right shoulder: Secondary | ICD-10-CM | POA: Insufficient documentation

## 2020-01-21 DIAGNOSIS — F17210 Nicotine dependence, cigarettes, uncomplicated: Secondary | ICD-10-CM | POA: Insufficient documentation

## 2020-01-21 DIAGNOSIS — S0081XA Abrasion of other part of head, initial encounter: Secondary | ICD-10-CM | POA: Insufficient documentation

## 2020-01-21 DIAGNOSIS — W01198A Fall on same level from slipping, tripping and stumbling with subsequent striking against other object, initial encounter: Secondary | ICD-10-CM | POA: Insufficient documentation

## 2020-01-21 DIAGNOSIS — E119 Type 2 diabetes mellitus without complications: Secondary | ICD-10-CM | POA: Diagnosis not present

## 2020-01-21 DIAGNOSIS — S065XAA Traumatic subdural hemorrhage with loss of consciousness status unknown, initial encounter: Secondary | ICD-10-CM

## 2020-01-21 DIAGNOSIS — F10920 Alcohol use, unspecified with intoxication, uncomplicated: Secondary | ICD-10-CM

## 2020-01-21 DIAGNOSIS — R55 Syncope and collapse: Secondary | ICD-10-CM

## 2020-01-21 LAB — CBC WITH DIFFERENTIAL/PLATELET
Abs Immature Granulocytes: 0.02 10*3/uL (ref 0.00–0.07)
Basophils Absolute: 0.1 10*3/uL (ref 0.0–0.1)
Basophils Relative: 1 %
Eosinophils Absolute: 0.1 10*3/uL (ref 0.0–0.5)
Eosinophils Relative: 2 %
HCT: 48.9 % (ref 39.0–52.0)
Hemoglobin: 16.9 g/dL (ref 13.0–17.0)
Immature Granulocytes: 0 %
Lymphocytes Relative: 27 %
Lymphs Abs: 1.7 10*3/uL (ref 0.7–4.0)
MCH: 33.1 pg (ref 26.0–34.0)
MCHC: 34.6 g/dL (ref 30.0–36.0)
MCV: 95.9 fL (ref 80.0–100.0)
Monocytes Absolute: 0.5 10*3/uL (ref 0.1–1.0)
Monocytes Relative: 8 %
Neutro Abs: 3.9 10*3/uL (ref 1.7–7.7)
Neutrophils Relative %: 62 %
Platelets: 139 10*3/uL — ABNORMAL LOW (ref 150–400)
RBC: 5.1 MIL/uL (ref 4.22–5.81)
RDW: 12 % (ref 11.5–15.5)
WBC: 6.2 10*3/uL (ref 4.0–10.5)
nRBC: 0 % (ref 0.0–0.2)

## 2020-01-21 LAB — ETHANOL: Alcohol, Ethyl (B): 129 mg/dL — ABNORMAL HIGH (ref <10)

## 2020-01-21 LAB — BASIC METABOLIC PANEL
Anion gap: 11 (ref 5–15)
BUN: 9 mg/dL (ref 6–20)
CO2: 23 mmol/L (ref 22–32)
Calcium: 8.5 mg/dL — ABNORMAL LOW (ref 8.9–10.3)
Chloride: 106 mmol/L (ref 98–111)
Creatinine, Ser: 0.82 mg/dL (ref 0.61–1.24)
GFR, Estimated: >60 mL/min (ref >60)
Glucose, Bld: 73 mg/dL (ref 70–99)
Potassium: 4.1 mmol/L (ref 3.5–5.1)
Sodium: 140 mmol/L (ref 135–145)

## 2020-01-21 MED ORDER — HYDROCODONE-ACETAMINOPHEN 5-325 MG PO TABS
2.0000 | ORAL_TABLET | Freq: Once | ORAL | Status: AC
  Administered 2020-01-21: 2 via ORAL
  Filled 2020-01-21: qty 2

## 2020-01-21 MED ORDER — MORPHINE SULFATE (PF) 4 MG/ML IV SOLN
4.0000 mg | Freq: Once | INTRAVENOUS | Status: AC
  Administered 2020-01-21: 4 mg via INTRAVENOUS
  Filled 2020-01-21: qty 1

## 2020-01-21 MED ORDER — SODIUM CHLORIDE 0.9 % IV BOLUS
1000.0000 mL | Freq: Once | INTRAVENOUS | Status: AC
  Administered 2020-01-21: 1000 mL via INTRAVENOUS

## 2020-01-21 MED ORDER — CHLORDIAZEPOXIDE HCL 25 MG PO CAPS
ORAL_CAPSULE | ORAL | 0 refills | Status: DC
Start: 2020-01-21 — End: 2020-05-17

## 2020-01-21 NOTE — Discharge Instructions (Addendum)
Begin taking Librium taper as prescribed.  Hold your clonazepam while you are taking this medication.  Take Tylenol 1000 mg every 6 hours as needed for headache.  Follow-up with outpatient alcohol treatment as provided in this discharge summary if so desired.  Return to the emergency department if you develop severe headache, seizure activity, or other new and concerning symptoms.

## 2020-01-21 NOTE — ED Triage Notes (Signed)
Pt states he doesn't remember how he fell but he thinks he landed on a church bell on his right side, hit his head on the right side. Pt c/o right arm pain, changes is vision, HA, neck pain and back pain. C-collar applied in triage. Pt A&O x 4. Reports he fell ~5 ft.

## 2020-01-21 NOTE — ED Notes (Signed)
Attempted to call sister without answer, able to reach Leda Min who was not able to provide information. He feels sister has more input with pt.

## 2020-01-21 NOTE — ED Provider Notes (Signed)
[facility] DEPT Provider Note   CSN: [id number] Arrival date & time: 01/21/20  3154     History Chief Complaint  Patient presents with  . Fall  . Arm Pain  . Neck Pain    Brandon Miller is a 54 y.o. male.  Patient is a 54 year old male with history of diabetes, anxiety, possible prior seizure.  He presents today for evaluation of syncope versus fall versus seizure.  Patient states that he was on his front porch, then woke up on the floor with a bump on his head and having pain in his right shoulder.  Patient does not know how he got there.  He is uncertain as to whether he tripped and struck his head, had a seizure, or passed out.  He tells me he has a large church bell on his front porch and noticed that this was out of place when he woke up.  He thinks he may have struck his head on this bell.  He denies chest pain or difficulty breathing.  Upon reviewing his medical record, patient was recently admitted for alcohol detox to an outside facility.  The history is provided by the patient.       Past Medical History:  Diagnosis Date  . Allergy   . Anemia   . Anxiety   . Arthritis    RA  . Complication of anesthesia    hard to wake up-was told he may have sleep apnea-never tested  . Depression   . Diabetes mellitus without complication ([place])    with weight no more problems  . Seizure ([place])    had one seizure of unknown etiology a few tears ago, none since and on no meds for them  . Sleep apnea   . Snores     Patient Active Problem List   Diagnosis Date Noted  . Falls 08/10/2019  . Numbness and tingling of upper and lower extremities of both sides 08/10/2019  . Balance problem 08/10/2019  . Medication side effect 08/10/2019  . Alcohol withdrawal ([place]) 08/10/2019  . GERD (gastroesophageal reflux disease) 02/06/2019  . Attention deficit hyperactivity disorder (ADHD) 11/17/2018  . Tobacco dependence 11/17/2018  . Alcohol use  disorder, moderate, dependence ([place]) 11/17/2018  . History of diet-controlled diabetes 11/17/2018  . Elevated LFTs 11/05/2018  . Acute gastroenteritis 10/31/2018  . Sleep apnea   . Generalized abdominal pain   . Non-intractable vomiting   . Diarrhea   . ETOH abuse   . Ileus, unspecified ([place]) 10/29/2018  . Severe recurrent major depression without psychotic features ([place]) 08/15/2018  . Alcohol withdrawal seizure with perceptual disturbance ([place])   . Alcohol abuse with unspecified alcohol-induced disorder ([place]) 02/05/2018  . Transaminasemia 02/05/2018  . Anxiety and depression   . Alcohol withdrawal seizure without complication ([place]) 00/86/7619  . DJD (degenerative joint disease) of knee 08/25/2014  . Diabetes mellitus, type 2 ([place]) 10/31/2011  . Hyperglycemia 10/31/2011  . PNA (pneumonia) 10/30/2011  . LEG PAIN, RIGHT 01/20/2010    Past Surgical History:  Procedure Laterality Date  . BIOPSY  02/12/2019   Procedure: BIOPSY;  Surgeon: Daneil Dolin, MD;  [facility];  Service: Endoscopy;;  stomach  . COLONOSCOPY WITH PROPOFOL N/A 02/12/2019   Procedure: COLONOSCOPY WITH PROPOFOL;  Surgeon: Daneil Dolin, MD;  Location: [facility];  Service: Endoscopy;  Laterality: N/A;  10:30am  . ELBOW SURGERY Right 2010   Dr. Percell Miller  . ESOPHAGOGASTRODUODENOSCOPY (EGD) WITH PROPOFOL N/A 02/12/2019  Procedure: ESOPHAGOGASTRODUODENOSCOPY (EGD) WITH PROPOFOL;  Surgeon: Corbin Ade, MD;  Location: [facility];  Service: Endoscopy;  Laterality: N/A;  . HERNIA REPAIR     Right inguinal  . KNEE ARTHROSCOPY WITH MEDIAL MENISECTOMY Right 06/11/2013   Procedure: RIGHT KNEE ARTHROSCOPY WITH MEDIAL MENISECTOMY AND CHONDROPLASTY;  Surgeon: Loreta Ave, MD;  Location: [facility];  Service: Orthopedics;  Laterality: Right;  Chondroplasty, loose bodies, medial menisectomy, lysis of adhesions.  Marland Kitchen KNEE SURGERY Right 2011  . POLYPECTOMY  02/12/2019   Procedure: POLYPECTOMY;   Surgeon: Corbin Ade, MD;  Location: [facility];  Service: Endoscopy;;  . right ankle    . SHOULDER SURGERY Right 2010   Dr. Eulah Pont  . TOTAL KNEE ARTHROPLASTY Right 08/25/2014   Procedure: TOTAL KNEE ARTHROPLASTY;  Surgeon: Loreta Ave, MD;  [facility];  Service: Orthopedics;  Laterality: Right;       Family History  Problem Relation Age of Onset  . Hepatitis Mother   . Cirrhosis Mother        Did not drink  . Pneumonia Father   . Kidney failure Father   . Gastric cancer Brother        ? Gastric CA spread to esophagus  . Colon cancer Neg Hx     Social History   Tobacco Use  . Smoking status: Current Every Day Smoker    Packs/day: 0.50    Years: 25.00    Pack years: 12.50    Types: Cigarettes  . Smokeless tobacco: Never Used  Vaping Use  . Vaping Use: Never used  Substance Use Topics  . Alcohol use: Yes    Comment: occ  . Drug use: No    Home Medications Prior to Admission medications   Medication Sig Start Date End Date Taking? Authorizing Provider  amphetamine-dextroamphetamine (ADDERALL) 15 MG tablet Take 15 mg by mouth 2 (two) times daily.    [provider]  clonazePAM (KLONOPIN) 1 MG tablet Take 1 mg by mouth 2 (two) times daily.    [provider]  DULoxetine (CYMBALTA) 20 MG capsule Take 20 mg by mouth daily. Patient not taking: Reported on 08/20/2019 07/31/19   [provider]  folic acid (FOLVITE) 1 MG tablet Take 1 tablet (1 mg total) by mouth daily. Patient not taking: Reported on 08/20/2019 08/12/19   Shon Hale, MD  gabapentin (NEURONTIN) 100 MG capsule Take 100 mg by mouth 3 (three) times daily. Patient not taking: Reported on 08/20/2019 07/31/19   [provider]  Multiple Vitamin (MULTIVITAMIN WITH MINERALS) TABS tablet Take 1 tablet by mouth daily. Patient not taking: Reported on 08/20/2019 08/12/19   Shon Hale, MD  nicotine (NICODERM CQ - DOSED IN MG/24 HOURS) 14 mg/24hr patch Place 1 patch (14  mg total) onto the skin daily. Patient not taking: Reported on 08/20/2019 08/12/19   Shon Hale, MD  ondansetron (ZOFRAN ODT) 4 MG disintegrating tablet Take 1 tablet (4 mg total) by mouth every 8 (eight) hours as needed for nausea or vomiting. 08/20/19   Kendrick, Caitlyn S, PA-C  ondansetron (ZOFRAN) 4 MG tablet Take 1 tablet (4 mg total) by mouth every 6 (six) hours as needed for nausea. Patient not taking: Reported on 08/20/2019 08/11/19   Shon Hale, MD  thiamine 100 MG tablet Take 1 tablet (100 mg total) by mouth daily. Patient not taking: Reported on 08/20/2019 08/12/19   Shon Hale, MD    Allergies    Daypro [oxaprozin], Diclofenac  sodium, Indocin [indomethacin], and Zanaflex [tizanidine hcl]  Review of Systems   Review of Systems  All other systems reviewed and are negative.   Physical Exam Updated Vital Signs BP 99/67 (BP Location: Left Arm)   Pulse 76   Temp 98.8 F (37.1 C)   Resp (!) 22   SpO2 98%   Physical Exam Vitals and nursing note reviewed.  Constitutional:      General: He is not in acute distress.    Appearance: He is well-developed and well-nourished. He is not diaphoretic.  HENT:     Head: Normocephalic.     Comments: There is a small contusion/abrasion to the right frontoparietal region.  There is no palpable defect.    Mouth/Throat:     Mouth: Oropharynx is clear and moist.  Neck:     Comments: There is tenderness to palpation in the soft tissues of the cervical region.  There is no bony tenderness or step-off. Cardiovascular:     Rate and Rhythm: Normal rate and regular rhythm.     Heart sounds: No murmur heard. No friction rub.  Pulmonary:     Effort: Pulmonary effort is normal. No respiratory distress.     Breath sounds: Normal breath sounds. No wheezing or rales.  Abdominal:     General: Bowel sounds are normal. There is no distension.     Palpations: Abdomen is soft.     Tenderness: There is no abdominal tenderness.   Musculoskeletal:        General: No edema. Normal range of motion.     Cervical back: Normal range of motion and neck supple. Tenderness present.     Comments: There is tenderness over the mid clavicle and lateral aspect of the shoulder.  There is no obvious deformity.  Ulnar and radial pulses are palpable in the right arm and motor and sensation are intact.  He is able to flex, extend, and oppose all fingers.  Skin:    General: Skin is warm and dry.  Neurological:     General: No focal deficit present.     Mental Status: He is alert and oriented to person, place, and time.     Cranial Nerves: No cranial nerve deficit.     Coordination: Coordination normal.     ED Results / Procedures / Treatments   Labs (all labs ordered are listed, but only abnormal results are displayed) Labs Reviewed  BASIC METABOLIC PANEL  CBC WITH DIFFERENTIAL/PLATELET    EKG EKG Interpretation  Date/Time:  Thursday January 21 2020 17:55:23 EST Ventricular Rate:  57 PR Interval:    QRS Duration: 105 QT Interval:  452 QTC Calculation: 441 R Axis:   66 Text Interpretation: Sinus rhythm Normal ECG Confirmed by Geoffery Lyons ([id number]) on 01/21/2020 6:22:06 PM   Radiology No results found.  Procedures Procedures (including critical care time)  Medications Ordered in ED Medications  sodium chloride 0.9 % bolus 1,000 mL (has no administration in time range)    ED Course  I have reviewed the triage vital signs and the nursing notes.  Pertinent labs & imaging results that were available during my care of the patient were reviewed by me and considered in my medical decision making (see chart for details).    MDM Rules/Calculators/A&P  Patient is a 54 year old male with history of chronic alcohol use, prior knee surgery presenting for evaluation of what sounds to me like an alcohol withdrawal seizure.  This occurred on the patient's front porch.  He believes  that he struck his head on a church bell  which he has on his porch.  He does have findings of a small subdural hematoma on his CT scan, however is neurologically intact and very well-appearing.  I have discussed this finding with Costello/Nundkumar from neurosurgery who do not feel as though this is a surgical condition.  They also do not feel as though admission is indicated and that patient can be discharged from their standpoint.  Remainder of his work-up shows a blood alcohol of 129, but studies are otherwise unremarkable.  Patient with recent admission at an outside facility for alcohol abuse.  He is in the process of arranging follow-up to undergo treatment.  Patient will be prescribed a Librium taper.  He is to take Tylenol at home for his headache and follow-up with outpatient alcohol treatment as previously arranged.  He will be given the resource guide as well.  Final Clinical Impression(s) / ED Diagnoses Final diagnoses:  None    Rx / DC Orders ED Discharge Orders    None       Geoffery Lyons, MD 01/21/20 [phone]

## 2020-01-21 NOTE — Progress Notes (Signed)
Orthopedic Tech Progress Note Patient Details:  Brandon Miller [birth year] [id number]  Ortho Devices Ortho Device/Splint Location: applied shoulder immobilizer to RUE Ortho Device/Splint Interventions: Ordered,Application   Post Interventions Patient Tolerated: Well Instructions Provided: Care of device   [name] 01/21/2020, 6:27 PM

## 2020-02-03 DIAGNOSIS — S065X9A Traumatic subdural hemorrhage with loss of consciousness of unspecified duration, initial encounter: Secondary | ICD-10-CM | POA: Insufficient documentation

## 2020-02-03 DIAGNOSIS — S065XAA Traumatic subdural hemorrhage with loss of consciousness status unknown, initial encounter: Secondary | ICD-10-CM | POA: Insufficient documentation

## 2020-05-16 ENCOUNTER — Other Ambulatory Visit: Payer: Self-pay

## 2020-05-16 ENCOUNTER — Ambulatory Visit (HOSPITAL_COMMUNITY)
Admission: EM | Admit: 2020-05-16 | Discharge: 2020-05-17 | Disposition: A | Discharge status: Other Acute Inpatient Hospital | Payer: No Payment, Other | Attending: Psychiatry | Admitting: Psychiatry

## 2020-05-16 DIAGNOSIS — F1094 Alcohol use, unspecified with alcohol-induced mood disorder: Secondary | ICD-10-CM

## 2020-05-16 DIAGNOSIS — F1020 Alcohol dependence, uncomplicated: Secondary | ICD-10-CM

## 2020-05-16 DIAGNOSIS — Z20822 Contact with and (suspected) exposure to covid-19: Secondary | ICD-10-CM | POA: Insufficient documentation

## 2020-05-16 DIAGNOSIS — F1024 Alcohol dependence with alcohol-induced mood disorder: Secondary | ICD-10-CM | POA: Insufficient documentation

## 2020-05-16 NOTE — ED Notes (Signed)
[employer]

## 2020-05-17 ENCOUNTER — Encounter (HOSPITAL_COMMUNITY): Payer: Self-pay | Admitting: Psychiatry

## 2020-05-17 ENCOUNTER — Inpatient Hospital Stay (HOSPITAL_COMMUNITY)
Admit: 2020-05-17 | Discharge: 2020-05-17 | Disposition: A | Discharge status: Home / Self Care | Payer: Federal, State, Local not specified - Other | Attending: Psychiatry | Admitting: Psychiatry

## 2020-05-17 ENCOUNTER — Inpatient Hospital Stay (HOSPITAL_COMMUNITY)
Admission: AD | Admit: 2020-05-17 | Discharge: 2020-05-23 | DRG: 897 | Disposition: A | Discharge status: Home / Self Care | Payer: Federal, State, Local not specified - Other | Source: Intra-hospital | Attending: Psychiatry | Admitting: Psychiatry

## 2020-05-17 DIAGNOSIS — Z20822 Contact with and (suspected) exposure to covid-19: Secondary | ICD-10-CM | POA: Diagnosis present

## 2020-05-17 DIAGNOSIS — T1490XA Injury, unspecified, initial encounter: Secondary | ICD-10-CM

## 2020-05-17 DIAGNOSIS — Z96651 Presence of right artificial knee joint: Secondary | ICD-10-CM | POA: Diagnosis present

## 2020-05-17 DIAGNOSIS — G4700 Insomnia, unspecified: Secondary | ICD-10-CM | POA: Diagnosis present

## 2020-05-17 DIAGNOSIS — R45851 Suicidal ideations: Secondary | ICD-10-CM | POA: Diagnosis present

## 2020-05-17 DIAGNOSIS — K219 Gastro-esophageal reflux disease without esophagitis: Secondary | ICD-10-CM | POA: Diagnosis present

## 2020-05-17 DIAGNOSIS — E039 Hypothyroidism, unspecified: Secondary | ICD-10-CM | POA: Diagnosis present

## 2020-05-17 DIAGNOSIS — D696 Thrombocytopenia, unspecified: Secondary | ICD-10-CM | POA: Diagnosis present

## 2020-05-17 DIAGNOSIS — E119 Type 2 diabetes mellitus without complications: Secondary | ICD-10-CM | POA: Diagnosis present

## 2020-05-17 DIAGNOSIS — R7989 Other specified abnormal findings of blood chemistry: Secondary | ICD-10-CM | POA: Diagnosis present

## 2020-05-17 DIAGNOSIS — F1024 Alcohol dependence with alcohol-induced mood disorder: Secondary | ICD-10-CM | POA: Diagnosis present

## 2020-05-17 DIAGNOSIS — Z87898 Personal history of other specified conditions: Secondary | ICD-10-CM

## 2020-05-17 DIAGNOSIS — F1020 Alcohol dependence, uncomplicated: Secondary | ICD-10-CM

## 2020-05-17 DIAGNOSIS — F1094 Alcohol use, unspecified with alcohol-induced mood disorder: Secondary | ICD-10-CM

## 2020-05-17 DIAGNOSIS — F1994 Other psychoactive substance use, unspecified with psychoactive substance-induced mood disorder: Secondary | ICD-10-CM | POA: Diagnosis not present

## 2020-05-17 DIAGNOSIS — M79672 Pain in left foot: Secondary | ICD-10-CM | POA: Diagnosis present

## 2020-05-17 DIAGNOSIS — Y906 Blood alcohol level of 120-199 mg/100 ml: Secondary | ICD-10-CM | POA: Diagnosis present

## 2020-05-17 DIAGNOSIS — R569 Unspecified convulsions: Secondary | ICD-10-CM | POA: Diagnosis present

## 2020-05-17 DIAGNOSIS — F332 Major depressive disorder, recurrent severe without psychotic features: Secondary | ICD-10-CM | POA: Diagnosis not present

## 2020-05-17 DIAGNOSIS — F32A Depression, unspecified: Principal | ICD-10-CM

## 2020-05-17 LAB — COMPREHENSIVE METABOLIC PANEL
ALT: 65 U/L — ABNORMAL HIGH (ref 0–44)
AST: 141 U/L — ABNORMAL HIGH (ref 15–41)
Albumin: 4.1 g/dL (ref 3.5–5.0)
Alkaline Phosphatase: 107 U/L (ref 38–126)
Anion gap: 14 (ref 5–15)
BUN: 8 mg/dL (ref 6–20)
CO2: 26 mmol/L (ref 22–32)
Calcium: 8.9 mg/dL (ref 8.9–10.3)
Chloride: 95 mmol/L — ABNORMAL LOW (ref 98–111)
Creatinine, Ser: 0.78 mg/dL (ref 0.61–1.24)
GFR, Estimated: >60 mL/min (ref >60)
Glucose, Bld: 122 mg/dL — ABNORMAL HIGH (ref 70–99)
Potassium: 3 mmol/L — ABNORMAL LOW (ref 3.5–5.1)
Sodium: 135 mmol/L (ref 135–145)
Total Bilirubin: 1.6 mg/dL — ABNORMAL HIGH (ref 0.3–1.2)
Total Protein: 7.6 g/dL (ref 6.5–8.1)

## 2020-05-17 LAB — POCT URINE DRUG SCREEN - MANUAL ENTRY (I-SCREEN)
POC Amphetamine UR: POSITIVE — AB
POC Buprenorphine (BUP): NOT DETECTED
POC Cocaine UR: NOT DETECTED
POC Marijuana UR: NOT DETECTED
POC Methadone UR: NOT DETECTED
POC Methamphetamine UR: NOT DETECTED
POC Morphine: NOT DETECTED
POC Oxazepam (BZO): NOT DETECTED
POC Oxycodone UR: NOT DETECTED
POC Secobarbital (BAR): NOT DETECTED

## 2020-05-17 LAB — CBC WITH DIFFERENTIAL/PLATELET
Abs Immature Granulocytes: 0.01 10*3/uL (ref 0.00–0.07)
Basophils Absolute: 0.1 10*3/uL (ref 0.0–0.1)
Basophils Relative: 1 %
Eosinophils Absolute: 0.1 10*3/uL (ref 0.0–0.5)
Eosinophils Relative: 1 %
HCT: 46.7 % (ref 39.0–52.0)
Hemoglobin: 16.3 g/dL (ref 13.0–17.0)
Immature Granulocytes: 0 %
Lymphocytes Relative: 36 %
Lymphs Abs: 2.1 10*3/uL (ref 0.7–4.0)
MCH: 32.5 pg (ref 26.0–34.0)
MCHC: 34.9 g/dL (ref 30.0–36.0)
MCV: 93 fL (ref 80.0–100.0)
Monocytes Absolute: 0.4 10*3/uL (ref 0.1–1.0)
Monocytes Relative: 6 %
Neutro Abs: 3.3 10*3/uL (ref 1.7–7.7)
Neutrophils Relative %: 56 %
Platelets: 126 10*3/uL — ABNORMAL LOW (ref 150–400)
RBC: 5.02 MIL/uL (ref 4.22–5.81)
RDW: 12.3 % (ref 11.5–15.5)
WBC: 5.9 10*3/uL (ref 4.0–10.5)
nRBC: 0 % (ref 0.0–0.2)

## 2020-05-17 LAB — RESP PANEL BY RT-PCR (FLU A&B, COVID) ARPGX2
Influenza A by PCR: NEGATIVE
Influenza B by PCR: NEGATIVE
SARS Coronavirus 2 by RT PCR: NEGATIVE

## 2020-05-17 LAB — ETHANOL: Alcohol, Ethyl (B): 188 mg/dL — ABNORMAL HIGH (ref <10)

## 2020-05-17 LAB — POC SARS CORONAVIRUS 2 AG: SARSCOV2ONAVIRUS 2 AG: NEGATIVE

## 2020-05-17 MED ORDER — ACETAMINOPHEN 325 MG PO TABS: 650.0000 mg | ORAL_TABLET | Freq: Four times a day (QID) | ORAL | Status: DC | PRN

## 2020-05-17 MED ORDER — ALUM & MAG HYDROXIDE-SIMETH 200-200-20 MG/5ML PO SUSP: 30.0000 mL | ORAL | Status: DC | PRN

## 2020-05-17 MED ORDER — GABAPENTIN 300 MG PO CAPS: 300.0000 mg | ORAL_CAPSULE | Freq: Three times a day (TID) | ORAL | Status: DC

## 2020-05-17 MED ORDER — MAGNESIUM HYDROXIDE 400 MG/5ML PO SUSP: 30.0000 mL | Freq: Every day | ORAL | Status: DC | PRN

## 2020-05-17 MED ORDER — ONDANSETRON 4 MG PO TBDP: 4.0000 mg | ORAL_TABLET | Freq: Four times a day (QID) | ORAL | Status: AC | PRN

## 2020-05-17 MED ORDER — GABAPENTIN 300 MG PO CAPS
300.0000 mg | ORAL_CAPSULE | Freq: Three times a day (TID) | ORAL | Status: DC
  Administered 2020-05-17 (×2): 300 mg via ORAL
  Filled 2020-05-17 (×2): qty 1

## 2020-05-17 MED ORDER — PANTOPRAZOLE SODIUM 40 MG PO TBEC
40.0000 mg | DELAYED_RELEASE_TABLET | Freq: Every day | ORAL | Status: DC
  Administered 2020-05-17 – 2020-05-23 (×7): 40 mg via ORAL
  Filled 2020-05-17 (×10): qty 1

## 2020-05-17 MED ORDER — ONDANSETRON 4 MG PO TBDP: 4.0000 mg | ORAL_TABLET | Freq: Four times a day (QID) | ORAL | Status: DC | PRN

## 2020-05-17 MED ORDER — HYDROXYZINE HCL 25 MG PO TABS: 25.0000 mg | ORAL_TABLET | Freq: Three times a day (TID) | ORAL | Status: DC | PRN

## 2020-05-17 MED ORDER — POTASSIUM CHLORIDE CRYS ER 20 MEQ PO TBCR
40.0000 meq | EXTENDED_RELEASE_TABLET | Freq: Two times a day (BID) | ORAL | Status: DC
  Administered 2020-05-17: 40 meq via ORAL
  Filled 2020-05-17: qty 2

## 2020-05-17 MED ORDER — LEVETIRACETAM 500 MG PO TABS
500.0000 mg | ORAL_TABLET | Freq: Two times a day (BID) | ORAL | Status: DC
  Administered 2020-05-17 – 2020-05-23 (×12): 500 mg via ORAL
  Filled 2020-05-17 (×17): qty 1

## 2020-05-17 MED ORDER — LORAZEPAM 1 MG PO TABS: 1.0000 mg | ORAL_TABLET | Freq: Four times a day (QID) | ORAL | Status: DC | PRN

## 2020-05-17 MED ORDER — ADULT MULTIVITAMIN W/MINERALS CH
1.0000 | ORAL_TABLET | Freq: Every day | ORAL | Status: DC
  Administered 2020-05-17: 1 via ORAL
  Filled 2020-05-17: qty 1

## 2020-05-17 MED ORDER — FLUOXETINE HCL 20 MG PO CAPS
20.0000 mg | ORAL_CAPSULE | Freq: Every day | ORAL | Status: DC
  Administered 2020-05-17 – 2020-05-23 (×7): 20 mg via ORAL
  Filled 2020-05-17 (×10): qty 1

## 2020-05-17 MED ORDER — GABAPENTIN 300 MG PO CAPS
300.0000 mg | ORAL_CAPSULE | Freq: Three times a day (TID) | ORAL | Status: DC
  Administered 2020-05-17 – 2020-05-20 (×9): 300 mg via ORAL
  Filled 2020-05-17 (×14): qty 1

## 2020-05-17 MED ORDER — FLUOXETINE HCL 20 MG PO CAPS
20.0000 mg | ORAL_CAPSULE | Freq: Every day | ORAL | Status: DC
  Administered 2020-05-17: 20 mg via ORAL
  Filled 2020-05-17: qty 1

## 2020-05-17 MED ORDER — TRAMADOL HCL 50 MG PO TABS: 50.0000 mg | ORAL_TABLET | Freq: Four times a day (QID) | ORAL | Status: DC | PRN

## 2020-05-17 MED ORDER — TRAZODONE HCL 50 MG PO TABS: 50.0000 mg | ORAL_TABLET | Freq: Every evening | ORAL | Status: DC | PRN

## 2020-05-17 MED ORDER — ADULT MULTIVITAMIN W/MINERALS CH
1.0000 | ORAL_TABLET | Freq: Every day | ORAL | Status: DC
  Administered 2020-05-17 – 2020-05-23 (×7): 1 via ORAL
  Filled 2020-05-17 (×10): qty 1

## 2020-05-17 MED ORDER — ADULT MULTIVITAMIN W/MINERALS CH
1.0000 | ORAL_TABLET | Freq: Every day | ORAL | Status: DC
  Filled 2020-05-17: qty 1

## 2020-05-17 MED ORDER — THIAMINE HCL 100 MG PO TABS
100.0000 mg | ORAL_TABLET | Freq: Every day | ORAL | Status: DC
  Administered 2020-05-18 – 2020-05-23 (×6): 100 mg via ORAL
  Filled 2020-05-17 (×8): qty 1

## 2020-05-17 MED ORDER — HYDROXYZINE HCL 25 MG PO TABS: 25.0000 mg | ORAL_TABLET | Freq: Four times a day (QID) | ORAL | Status: DC | PRN

## 2020-05-17 MED ORDER — HYDROXYZINE HCL 25 MG PO TABS
25.0000 mg | ORAL_TABLET | Freq: Four times a day (QID) | ORAL | Status: AC | PRN
  Administered 2020-05-17 – 2020-05-19 (×3): 25 mg via ORAL
  Filled 2020-05-17 (×4): qty 1

## 2020-05-17 MED ORDER — THIAMINE HCL 100 MG/ML IJ SOLN: 100.0000 mg | Freq: Once | INTRAMUSCULAR | Status: DC

## 2020-05-17 MED ORDER — LORAZEPAM 1 MG PO TABS
1.0000 mg | ORAL_TABLET | Freq: Four times a day (QID) | ORAL | Status: AC | PRN
  Administered 2020-05-18 – 2020-05-19 (×2): 1 mg via ORAL
  Filled 2020-05-17: qty 1
  Filled 2020-05-17: qty 2

## 2020-05-17 MED ORDER — CHLORDIAZEPOXIDE HCL 25 MG PO CAPS: 25.0000 mg | ORAL_CAPSULE | Freq: Four times a day (QID) | ORAL | Status: DC | PRN

## 2020-05-17 MED ORDER — THIAMINE HCL 100 MG PO TABS: 100.0000 mg | ORAL_TABLET | Freq: Every day | ORAL | Status: DC

## 2020-05-17 MED ORDER — THIAMINE HCL 100 MG PO TABS
100.0000 mg | ORAL_TABLET | Freq: Every day | ORAL | Status: DC
  Filled 2020-05-17: qty 1

## 2020-05-17 MED ORDER — ADULT MULTIVITAMIN W/MINERALS CH: 1.0000 | ORAL_TABLET | Freq: Every day | ORAL | Status: DC

## 2020-05-17 MED ORDER — ENSURE ENLIVE PO LIQD
237.0000 mL | Freq: Two times a day (BID) | ORAL | Status: DC
  Administered 2020-05-17 – 2020-05-23 (×11): 237 mL via ORAL
  Filled 2020-05-17 (×16): qty 237

## 2020-05-17 MED ORDER — LORAZEPAM 1 MG PO TABS: 1.0000 mg | ORAL_TABLET | Freq: Four times a day (QID) | ORAL | 0 refills | Status: DC | PRN

## 2020-05-17 MED ORDER — LOPERAMIDE HCL 2 MG PO CAPS: 2.0000 mg | ORAL_CAPSULE | ORAL | Status: AC | PRN

## 2020-05-17 MED ORDER — TRAZODONE HCL 50 MG PO TABS
50.0000 mg | ORAL_TABLET | Freq: Every evening | ORAL | Status: DC | PRN
  Administered 2020-05-17 – 2020-05-21 (×5): 50 mg via ORAL
  Filled 2020-05-17 (×5): qty 1

## 2020-05-17 MED ORDER — LOPERAMIDE HCL 2 MG PO CAPS: 2.0000 mg | ORAL_CAPSULE | ORAL | Status: DC | PRN

## 2020-05-17 MED ORDER — THIAMINE HCL 100 MG PO TABS
100.0000 mg | ORAL_TABLET | Freq: Every day | ORAL | Status: DC
  Administered 2020-05-17: 100 mg via ORAL
  Filled 2020-05-17: qty 1

## 2020-05-17 MED ORDER — FOLIC ACID 1 MG PO TABS
1.0000 mg | ORAL_TABLET | Freq: Every day | ORAL | Status: DC
  Administered 2020-05-17 – 2020-05-23 (×7): 1 mg via ORAL
  Filled 2020-05-17 (×10): qty 1

## 2020-05-17 NOTE — ED Notes (Signed)
Pt asleep in bed. Respirations even and unlabored. Will continue to monitor for safety. ?

## 2020-05-17 NOTE — H&P (Signed)
Psychiatric Admission Assessment Adult  Patient Identification: Brandon Miller MRN:  [id number] Date of Evaluation:  05/17/2020 Chief Complaint:  Substance induced mood disorder ([place]) [F19.94] Principal Diagnosis: <principal problem not specified> Diagnosis:  Active Problems:   Substance induced mood disorder ([place])  History of Present Illness: Patient is seen and examined.  Patient is a 55 year old male with a past psychiatric history significant for alcohol dependence who presented to the [facility] on 04/2020 with law enforcement for suicidal ideation.  The patient stated that he is sister called the police on the date of admission because he had made a suicidal statement.  The patient stated that "I did not really mean it".  He is frustrated and struggling.  He has been drinking approximately a case of beer a day.  He has been unemployed, and that made things worse.  He is getting out of a bad relationship with a male.  He has had some worsening medical problems including a previous head injury and postconcussion syndrome.  He had a psychiatric hospitalization for similar circumstances at [facility] in 01/09/2020.  He stated he only stayed sober for a few days after that, and did not go to rehab.  Shortly after that he suffered a subdural hematoma and was in the [facility] emergency department on 01/21/2020 with that.  He has followed up as an outpatient with neuro.  He admitted that he had had a seizure approximately 3 years ago.  This was an alcohol-related seizure.  He is also prescribed clonazepam 1 mg p.o. twice daily and Adderall 20 mg p.o. daily by his primary care provider.  There is also hydrocodone prescription from 03/31/2020.  He admitted to helplessness, hopelessness and worthlessness.  He denied suicidal ideation.  He is considering going to a rehabilitation facility.  He has not decided at this point.  He was  admitted to the hospital for evaluation and stabilization.  Associated Signs/Symptoms: Depression Symptoms:  depressed mood, anhedonia, insomnia, psychomotor agitation, fatigue, feelings of worthlessness/guilt, difficulty concentrating, hopelessness, suicidal thoughts without plan, anxiety, panic attacks, loss of energy/fatigue, disturbed sleep, Duration of Depression Symptoms: No data recorded (Hypo) Manic Symptoms:  Impulsivity, Irritable Mood, Labiality of Mood, Anxiety Symptoms:  Excessive Worry, Psychotic Symptoms:  Denied PTSD Symptoms: Negative Total Time spent with patient: 30 minutes  Past Psychiatric History: Patient has had a couple of hospitalizations secondary to alcohol related issues.  His last was in December 2021 at [facility] for alcohol related issues.  He admitted to drinking at least a case of beer a day.  He also related that he is prescribed clonazepam and amphetamines from his primary care provider.  He denied any other substance issues.  He denied previous admissions for substance rehabilitation.  Is the patient at risk to self? Yes.    Has the patient been a risk to self in the past 6 months? No.  Has the patient been a risk to self within the distant past? No.  Is the patient a risk to others? No.  Has the patient been a risk to others in the past 6 months? No.  Has the patient been a risk to others within the distant past? No.   Prior Inpatient Therapy:   Prior Outpatient Therapy:    Alcohol Screening: 1. How often do you have a drink containing alcohol?: 4 or more times a week 2. How many drinks containing alcohol do you have  on a typical day when you are drinking?: 10 or more 3. How often do you have six or more drinks on one occasion?: Daily or almost daily AUDIT-C Score: 12 4. How often during the last year have you found that you were not able to stop drinking once you had started?: Less than monthly 5. How often  during the last year have you failed to do what was normally expected from you because of drinking?: Weekly 6. How often during the last year have you needed a first drink in the morning to get yourself going after a heavy drinking session?: Daily or almost daily 7. How often during the last year have you had a feeling of guilt of remorse after drinking?: Monthly 8. How often during the last year have you been unable to remember what happened the night before because you had been drinking?: Less than monthly 9. Have you or someone else been injured as a result of your drinking?: No 10. Has a relative or friend or a doctor or another health worker been concerned about your drinking or suggested you cut down?: Yes, during the last year Alcohol Use Disorder Identification Test Final Score (AUDIT): 27 Alcohol Brief Interventions/Follow-up: Alcohol education/Brief advice Substance Abuse History in the last 12 months:  Yes.   Consequences of Substance Abuse: Medical Consequences:  Clearly the cause of this hospitalization Previous Psychotropic Medications: Yes  Psychological Evaluations: No  Past Medical History:  Past Medical History:  Diagnosis Date  . Allergy   . Anemia   . Anxiety   . Arthritis    RA  . Complication of anesthesia    hard to wake up-was told he may have sleep apnea-never tested  . Depression   . Diabetes mellitus without complication ([place])    with weight no more problems  . Seizure ([place])    had one seizure of unknown etiology a few tears ago, none since and on no meds for them  . Sleep apnea   . Snores     Past Surgical History:  Procedure Laterality Date  . BIOPSY  02/12/2019   Procedure: BIOPSY;  Surgeon: Daneil Dolin, MD;  [facility];  Service: Endoscopy;;  stomach  . COLONOSCOPY WITH PROPOFOL N/A 02/12/2019   Procedure: COLONOSCOPY WITH PROPOFOL;  Surgeon: Daneil Dolin, MD;  [facility];  Service: Endoscopy;  Laterality: N/A;  10:30am   . ELBOW SURGERY Right 2010   Dr. Percell Miller  . ESOPHAGOGASTRODUODENOSCOPY (EGD) WITH PROPOFOL N/A 02/12/2019   Procedure: ESOPHAGOGASTRODUODENOSCOPY (EGD) WITH PROPOFOL;  Surgeon: Daneil Dolin, MD;  [facility];  Service: Endoscopy;  Laterality: N/A;  . HERNIA REPAIR     Right inguinal  . KNEE ARTHROSCOPY WITH MEDIAL MENISECTOMY Right 06/11/2013   Procedure: RIGHT KNEE ARTHROSCOPY WITH MEDIAL MENISECTOMY AND CHONDROPLASTY;  Surgeon: Ninetta Lights, MD;  Location: [place];  Service: Orthopedics;  Laterality: Right;  Chondroplasty, loose bodies, medial menisectomy, lysis of adhesions.  Marland Kitchen KNEE SURGERY Right 2011  . POLYPECTOMY  02/12/2019   Procedure: POLYPECTOMY;  Surgeon: Daneil Dolin, MD;  [facility];  Service: Endoscopy;;  . right ankle    . SHOULDER SURGERY Right 2010   Dr. Percell Miller  . TOTAL KNEE ARTHROPLASTY Right 08/25/2014   Procedure: TOTAL KNEE ARTHROPLASTY;  Surgeon: Ninetta Lights, MD;  Location: [place];  Service: Orthopedics;  Laterality: Right;   Family History:  Family History  Problem Relation Age of Onset  .  Hepatitis Mother   . Cirrhosis Mother        Did not drink  . Pneumonia Father   . Kidney failure Father   . Gastric cancer Brother        ? Gastric CA spread to esophagus  . Colon cancer Neg Hx    Family Psychiatric  History: Patient admitted to significant family history of a brother and mother with substance abuse issues. Tobacco Screening: Have you used any form of tobacco in the last 30 days? (Cigarettes, Smokeless Tobacco, Cigars, and/or Pipes): Yes Tobacco use, Select all that apply: 5 or more cigarettes per day Are you interested in Tobacco Cessation Medications?: Yes, will notify MD for an order Counseled patient on smoking cessation including recognizing danger situations, developing coping skills and basic information about quitting provided: Yes Social History:  Social History   Substance and Sexual Activity   Alcohol Use Yes   Comment: 10-20/day cans beer     Social History   Substance and Sexual Activity  Drug Use No    Additional Social History:                           Allergies:   Allergies  Allergen Reactions  . Daypro [Oxaprozin] Other (See Comments)    'tears his stomach up"  . Diclofenac Sodium Other (See Comments)    dizzy  . Indocin [Indomethacin] Nausea And Vomiting  . Zanaflex [Tizanidine Hcl] Swelling   Lab Results:  Results for orders placed or performed during the hospital encounter of 05/16/20 (from the past 48 hour(s))  Resp Panel by RT-PCR (Flu A&B, Covid) Nasopharyngeal Swab     Status: None   Collection Time: 05/17/20  1:15 AM   Specimen: Nasopharyngeal Swab; Nasopharyngeal(NP) swabs in vial transport medium  Result Value Ref Range   SARS Coronavirus 2 by RT PCR NEGATIVE NEGATIVE    Comment: (NOTE) SARS-CoV-2 target nucleic acids are NOT DETECTED.  The SARS-CoV-2 RNA is generally detectable in upper respiratory specimens during the acute phase of infection. The lowest concentration of SARS-CoV-2 viral copies this assay can detect is 138 copies/mL. A negative result does not preclude SARS-Cov-2 infection and should not be used as the sole basis for treatment or other patient management decisions. A negative result may occur with  improper specimen collection/handling, submission of specimen other than nasopharyngeal swab, presence of viral mutation(s) within the areas targeted by this assay, and inadequate number of viral copies(<138 copies/mL). A negative result must be combined with clinical observations, patient history, and epidemiological information. The expected result is Negative.  Fact Sheet for Patients:  EntrepreneurPulse.com.au  Fact Sheet for Healthcare Providers:  IncredibleEmployment.be  This test is no t yet approved or cleared by the [place] FDA and  has been authorized for  detection and/or diagnosis of SARS-CoV-2 by FDA under an Emergency Use Authorization (EUA). This EUA will remain  in effect (meaning this test can be used) for the duration of the COVID-19 declaration under Section 564(b)(1) of the Act, 21 U.S.C.section 360bbb-3(b)(1), unless the authorization is terminated  or revoked sooner.       Influenza A by PCR NEGATIVE NEGATIVE   Influenza B by PCR NEGATIVE NEGATIVE    Comment: (NOTE) The Xpert Xpress SARS-CoV-2/FLU/RSV plus assay is intended as an aid in the diagnosis of influenza from Nasopharyngeal swab specimens and should not be used as a sole basis for treatment. Nasal washings and aspirates are unacceptable for Xpert  Xpress SARS-CoV-2/FLU/RSV testing.  Fact Sheet for Patients: BloggerCourse.com  Fact Sheet for Healthcare Providers: SeriousBroker.it  This test is not yet approved or cleared by the [place] FDA and has been authorized for detection and/or diagnosis of SARS-CoV-2 by FDA under an Emergency Use Authorization (EUA). This EUA will remain in effect (meaning this test can be used) for the duration of the COVID-19 declaration under Section 564(b)(1) of the Act, 21 U.S.C. section 360bbb-3(b)(1), unless the authorization is terminated or revoked.  Performed at [facility], [address] [place], [place] [address]   POCT Urine Drug Screen - ([facility])     Status: Abnormal   Collection Time: 05/17/20  1:22 AM  Result Value Ref Range   POC Amphetamine UR Positive (A) NONE DETECTED (Cut Off Level 1000 ng/mL)   POC Secobarbital (BAR) None Detected NONE DETECTED (Cut Off Level 300 ng/mL)   POC Buprenorphine (BUP) None Detected NONE DETECTED (Cut Off Level 10 ng/mL)   POC Oxazepam (BZO) None Detected NONE DETECTED (Cut Off Level 300 ng/mL)   POC Cocaine UR None Detected NONE DETECTED (Cut Off Level 300 ng/mL)   POC Methamphetamine UR None Detected NONE DETECTED (Cut Off  Level 1000 ng/mL)   POC Morphine None Detected NONE DETECTED (Cut Off Level 300 ng/mL)   POC Oxycodone UR None Detected NONE DETECTED (Cut Off Level 100 ng/mL)   POC Methadone UR None Detected NONE DETECTED (Cut Off Level 300 ng/mL)   POC Marijuana UR None Detected NONE DETECTED (Cut Off Level 50 ng/mL)  CBC with Differential/Platelet     Status: Abnormal   Collection Time: 05/17/20  1:25 AM  Result Value Ref Range   WBC 5.9 4.0 - 10.5 K/uL   RBC 5.02 4.22 - 5.81 MIL/uL   Hemoglobin 16.3 13.0 - 17.0 g/dL   HCT 24.5 80.9 - 98.3 %   MCV 93.0 80.0 - 100.0 fL   MCH 32.5 26.0 - 34.0 pg   MCHC 34.9 30.0 - 36.0 g/dL   RDW 38.2 50.5 - 39.7 %   Platelets 126 (L) 150 - 400 K/uL   nRBC 0.0 0.0 - 0.2 %   Neutrophils Relative % 56 %   Neutro Abs 3.3 1.7 - 7.7 K/uL   Lymphocytes Relative 36 %   Lymphs Abs 2.1 0.7 - 4.0 K/uL   Monocytes Relative 6 %   Monocytes Absolute 0.4 0.1 - 1.0 K/uL   Eosinophils Relative 1 %   Eosinophils Absolute 0.1 0.0 - 0.5 K/uL   Basophils Relative 1 %   Basophils Absolute 0.1 0.0 - 0.1 K/uL   Immature Granulocytes 0 %   Abs Immature Granulocytes 0.01 0.00 - 0.07 K/uL    Comment: Performed at [facility], [address], [place], [place] [address]  Comprehensive metabolic panel     Status: Abnormal   Collection Time: 05/17/20  1:25 AM  Result Value Ref Range   Sodium 135 135 - 145 mmol/L   Potassium 3.0 (L) 3.5 - 5.1 mmol/L   Chloride 95 (L) 98 - 111 mmol/L   CO2 26 22 - 32 mmol/L   Glucose, Bld 122 (H) 70 - 99 mg/dL    Comment: Glucose reference range applies only to samples taken after fasting for at least 8 hours.   BUN 8 6 - 20 mg/dL   Creatinine, Ser 9.37 0.61 - 1.24 mg/dL   Calcium 8.9 8.9 - 90.2 mg/dL   Total Protein 7.6 6.5 - 8.1 g/dL  Albumin 4.1 3.5 - 5.0 g/dL   AST 141 (H) 15 - 41 U/L   ALT 65 (H) 0 - 44 U/L   Alkaline Phosphatase 107 38 - 126 U/L   Total Bilirubin 1.6 (H) 0.3 - 1.2 mg/dL   GFR, Estimated >60 >60 mL/min    Comment:  (NOTE) Calculated using the CKD-EPI Creatinine Equation (2021)    Anion gap 14 5 - 15    Comment: Performed at [facility] [address] [place], [place] [address]  Ethanol     Status: Abnormal   Collection Time: 05/17/20  1:25 AM  Result Value Ref Range   Alcohol, Ethyl (B) 188 (H) <10 mg/dL    Comment: (NOTE) Lowest detectable limit for serum alcohol is 10 mg/dL.  For medical purposes only. Performed at [facility], [place] [address], [place], [place] [address]   POC SARS Coronavirus 2 Ag     Status: None   Collection Time: 05/17/20  1:36 AM  Result Value Ref Range   SARSCOV2ONAVIRUS 2 AG NEGATIVE NEGATIVE    Comment: (NOTE) SARS-CoV-2 antigen NOT DETECTED.   Negative results are presumptive.  Negative results do not preclude SARS-CoV-2 infection and should not be used as the sole basis for treatment or other patient management decisions, including infection  control decisions, particularly in the presence of clinical signs and  symptoms consistent with COVID-19, or in those who have been in contact with the virus.  Negative results must be combined with clinical observations, patient history, and epidemiological information. The expected result is Negative.  Fact Sheet for Patients: HandmadeRecipes.com.cy  Fact Sheet for Healthcare Providers: FuneralLife.at  This test is not yet approved or cleared by the [place] FDA and  has been authorized for detection and/or diagnosis of SARS-CoV-2 by FDA under an Emergency Use Authorization (EUA).  This EUA will remain in effect (meaning this test can be used) for the duration of  the COV ID-19 declaration under Section 564(b)(1) of the Act, 21 U.S.C. section 360bbb-3(b)(1), unless the authorization is terminated or revoked sooner.      Blood Alcohol level:  Lab Results  Component Value Date   ETH 188 (H) 05/17/2020   ETH 129 (H) [id number]    Metabolic  Disorder Labs:  Lab Results  Component Value Date   HGBA1C 4.7 11/17/2018   MPG 131 08/25/2014   MPG 154 (H) 10/30/2011   No results found for: PROLACTIN No results found for: CHOL, TRIG, HDL, CHOLHDL, VLDL, LDLCALC  Current Medications: Current Facility-Administered Medications  Medication Dose Route Frequency Provider Last Rate Last Admin  . acetaminophen (TYLENOL) tablet 650 mg  650 mg Oral Q6H PRN Sharma Covert, MD      . alum & mag hydroxide-simeth (MAALOX/MYLANTA) 200-200-20 MG/5ML suspension 30 mL  30 mL Oral Q4H PRN Sharma Covert, MD      . FLUoxetine (PROZAC) capsule 20 mg  20 mg Oral Daily Sharma Covert, MD   20 mg at 05/17/20 1342  . folic acid (FOLVITE) tablet 1 mg  1 mg Oral Daily Sharma Covert, MD   1 mg at 05/17/20 1342  . gabapentin (NEURONTIN) capsule 300 mg  300 mg Oral TID Sharma Covert, MD   300 mg at 05/17/20 1342  . hydrOXYzine (ATARAX/VISTARIL) tablet 25 mg  25 mg Oral Q6H PRN Sharma Covert, MD   25 mg at 05/17/20 1342  . levETIRAcetam (KEPPRA) tablet 500 mg  500 mg Oral BID  Sharma Covert, MD      . loperamide (IMODIUM) capsule 2-4 mg  2-4 mg Oral PRN Sharma Covert, MD      . LORazepam (ATIVAN) tablet 1 mg  1 mg Oral Q6H PRN Sharma Covert, MD      . magnesium hydroxide (MILK OF MAGNESIA) suspension 30 mL  30 mL Oral Daily PRN Sharma Covert, MD      . multivitamin with minerals tablet 1 tablet  1 tablet Oral Daily Sharma Covert, MD   1 tablet at 05/17/20 1342  . ondansetron (ZOFRAN-ODT) disintegrating tablet 4 mg  4 mg Oral Q6H PRN Sharma Covert, MD      . thiamine (B-1) injection 100 mg  100 mg Intramuscular Once Sharma Covert, MD      . Derrill Memo ON 05/18/2020] thiamine tablet 100 mg  100 mg Oral Daily Sharma Covert, MD      . traZODone (DESYREL) tablet 50 mg  50 mg Oral QHS PRN Sharma Covert, MD       PTA Medications: Medications Prior to Admission  Medication Sig Dispense Refill Last Dose   . clonazePAM (KLONOPIN) 1 MG tablet Take 1 mg by mouth 2 (two) times daily.     Marland Kitchen FLUoxetine (PROZAC) 20 MG capsule Take 1 capsule by mouth daily.     Marland Kitchen gabapentin (NEURONTIN) 300 MG capsule Take 1 capsule (300 mg total) by mouth 3 (three) times daily.     Marland Kitchen LORazepam (ATIVAN) 1 MG tablet Take 1 tablet (1 mg total) by mouth every 6 (six) hours as needed (CIWA > 10). 1 tablet 0   . [START ON 05/18/2020] Multiple Vitamin (MULTIVITAMIN WITH MINERALS) TABS tablet Take 1 tablet by mouth daily.     Derrill Memo ON 05/18/2020] thiamine 100 MG tablet Take 1 tablet (100 mg total) by mouth daily.       Musculoskeletal: Strength & Muscle Tone: within normal limits Gait & Station: normal Patient leans: N/A            Psychiatric Specialty Exam:  Presentation  General Appearance: Disheveled  Eye Contact:Fair  Speech:Normal Rate  Speech Volume:Decreased  Handedness:Right   Mood and Affect  Mood:Depressed; Dysphoric  Affect:Flat   Thought Process  Thought Processes:Coherent  Duration of Psychotic Symptoms: No data recorded Past Diagnosis of Schizophrenia or Psychoactive disorder: No data recorded Descriptions of Associations:Intact  Orientation:Full (Time, Place and Person)  Thought Content:Logical  Hallucinations:Hallucinations: None  Ideas of Reference:None  Suicidal Thoughts:Suicidal Thoughts: No SI Active Intent and/or Plan: Without Intent; Without Plan; With Access to Means (Patient states that he does not think that he has means to carry out but is not very adamant when he makes a statement.)  Homicidal Thoughts:Homicidal Thoughts: No   Sensorium  Memory:Immediate Fair; Recent Fair; Remote Fair  Judgment:Fair  Insight:Fair   Executive Functions  Concentration:Fair  Attention Span:Fair  [place]   Psychomotor Activity  Psychomotor Activity:Psychomotor Activity: Normal   Assets  Assets:Desire for  Improvement; Resilience; Housing   Sleep  Sleep:Sleep: Fair    Physical Exam: Physical Exam Vitals and nursing note reviewed.  HENT:     Head: Normocephalic.  Pulmonary:     Effort: Pulmonary effort is normal.  Neurological:     General: No focal deficit present.     Mental Status: He is alert and oriented to person, place, and time.    ROS Blood pressure 132/80, pulse (!) 50, temperature  98.1 F (36.7 C), temperature source Oral, resp. rate 18, height 6\' 2"  (1.88 m), weight 80.3 kg, SpO2 99 %. Body mass index is 22.73 kg/m.  Treatment Plan Summary: Daily contact with patient to assess and evaluate symptoms and progress in treatment, Medication management and Plan : Patient is seen and examined.  Patient is a 55 year old male with the above-stated past psychiatric history who was admitted with alcohol dependence, substance-induced mood disorder, alcohol withdrawal.  He will be admitted to the hospital.  He will be integrated in the milieu.  He will be encouraged to attend groups.  He will be placed on lorazepam 1 mg p.o. every 6 hours as needed and CIWA greater than 10.  He will also be placed on folic acid as well as thiamine.  Given his seizure history I am going to go on and placing on Keppra 500 mg p.o. twice daily.  I am also going to place him on seizure precautions.  The staff at the behavioral health urgent care center also placed him on fluoxetine 20 mg p.o. daily as well as Neurontin 300 mg p.o. 3 times daily.  We will continue that.  He will also have hydroxyzine for anxiety as well as trazodone for sleep.  He does complain of trauma to his right foot, and examination showed a significantly bruised foot.  We will go on to get that x-rayed to make sure there is no fractures.  Review of his admission laboratories revealed a low potassium at 3.0 and that will be supplemented.  His blood sugar was mildly elevated 122.  His AST was elevated at 141 and his ALT at 65.  These are more  elevated than previously but not that much.  CBC was essentially normal including his MCV.  His platelet count is slightly low at 126,000.  His last platelet count from 3 months ago was 139,000, and 9 months ago was as low as 86,000.  This is most likely secondary to alcohol.  Differential was normal.  Influenza panel was negative for influenza A, B and coronavirus.  Blood alcohol on admission was 188.  His drug screen was positive for amphetamines, but he does have a prescription for that.  We discussed the risk of seizures taking alcohol as well as amphetamines.  Currently his vital signs are stable, he is afebrile.  Pulse oximetry on room air was 99%.  Observation Level/Precautions:  Detox 15 minute checks Seizure  Laboratory:  Chemistry Profile  Psychotherapy:    Medications:    Consultations:    Discharge Concerns:    Estimated LOS:  Other:     Physician Treatment Plan for Primary Diagnosis: <principal problem not specified> Long Term Goal(s): Improvement in symptoms so as ready for discharge  Short Term Goals: Ability to identify changes in lifestyle to reduce recurrence of condition will improve, Ability to verbalize feelings will improve, Ability to disclose and discuss suicidal ideas, Ability to demonstrate self-control will improve, Ability to identify and develop effective coping behaviors will improve, Ability to maintain clinical measurements within normal limits will improve and Ability to identify triggers associated with substance abuse/mental health issues will improve  Physician Treatment Plan for Secondary Diagnosis: Active Problems:   Substance induced mood disorder ([place])  Long Term Goal(s): Improvement in symptoms so as ready for discharge  Short Term Goals: Ability to identify changes in lifestyle to reduce recurrence of condition will improve, Ability to verbalize feelings will improve, Ability to disclose and discuss suicidal ideas, Ability to demonstrate  self-control  will improve, Ability to identify and develop effective coping behaviors will improve, Ability to maintain clinical measurements within normal limits will improve and Ability to identify triggers associated with substance abuse/mental health issues will improve  I certify that inpatient services furnished can reasonably be expected to improve the patient's condition.    Sharma Covert, MD 4/26/20222:23 PM

## 2020-05-17 NOTE — ED Notes (Addendum)
Pt admitted to continuous assessment due to SI, HI, and ETOH use. Pt A&O x4, calm and cooperative. Pt tolerated lab work and skin assessment well. Pt ambulated independently to unit. Oriented to unit/staff. No signs of acute distress noted. Will continue to monitor for safety.

## 2020-05-17 NOTE — Progress Notes (Signed)
Pt under review with [facility].  Assunta Curtis, MSW, LCSW 05/17/2020 10:33 AM

## 2020-05-17 NOTE — ED Provider Notes (Addendum)
Behavioral Health Admission H&P [facility] & OBS)  Date: 05/17/20 Patient Name: Darral Khosla MRN: [id number] Chief Complaint:  Chief Complaint  Patient presents with  . Suicidal  . Homicidal  . Alcohol Problem   Chief Complaint/Presenting Problem: Pt has fleeting suicidal and homicial ideations wtih access to means. Pt has a history of substance use.  Diagnoses:  Final diagnoses:  Alcohol-induced mood disorder with depressive symptoms (HCC)  Alcohol use disorder, severe, dependence (HCC)    HPI: Kwon Verdun is a 55 year old male with history of alcohol induced mood disorder and alcohol use disorder who presents to the behavioral health urgent care voluntarily via law enforcement for SI with access to means.  Patient states that his sister called the police earlier today because he made a suicidal statement, but patient states that he "did not really mean" and does not clarify this further when asked to clarify further.  Patient states that he has been "really struggling" with his mental health recently.  Patient states that his depression has been exacerbated due to being unemployed, recently getting out of a bad relationship with a male, having multiple medical issues such as arthritis and gout, and having issues with alcohol consumption.  Patient endorses active SI on exam with no specific plan associated with his SI.  Patient states that he does not think that he would actually attempt to kill himself but does not appear to be very adamant when he makes a statement.  Patient does state that he lives alone in [facility] and owns multiple firearms and that his sister has been concerned that he may use them on himself at some point.  Patient denies past suicide attempts or self-injurious behavior. Patient endorses fleeting HI, but does not specify who the HI is towards and he denies any specific plan associated with his HI.  Patient denies AVH, paranoia, or delusions.  Patient endorses  drinking 15-20 beers daily.  He reports that he last drank an unknown amount of beer around 6 PM on 05/16/2020.  Patient denies history of withdrawal symptoms but does endorse history of seizures, stating that his last seizure was 1 year ago.  Patient states that he was last psychiatrically hospitalized at [facility].  Per chart review, patient was admitted to [facility] psychiatric service from January 09, 2020 to January 13, 2020 alcohol use disorder and alcohol induced mood disorder, was given Librium detox during admission, and was discharged with instructions to take Prozac 10 mg p.o. daily, Klonopin 1 mg p.o., and Adderall 20 mg 24-hour capsule p.o.  Patient states that he is not taking Prozac 20 mg p.o. daily and he states that he is still taking Klonopin and Adderall but he denies taking any additional medications.  PDMP review shows history of the patient being prescribed Klonopin 1 mg and Adderall 20 mg in March 2022.  Patient endorses smoking 1 pack/day of cigarettes since the age of 14 and he denies any additional drug use.  With patient's consent, collateral information was obtained by Odetta Pink, [facility] speaking with the patient's sister via phone.  This collateral information is shown below (per Odetta Pink, [facility] 05/17/20 note):   "*Pt consented to contact his sister Keenan Bachelor, [phone]) to gather additional information. Per sister, the pt has been with mental illness for several years, pt has no motivation or job. Per sister, years ago the pt asked her to come over and pull the trigger because  he can not do it. Pt's sister reported, the pt lives alone, she has offer for the pt to stay with her but he declined. Pt's sister reports, she and her husband buys the pt food as he does not, pays some of his bills. Pt's sister reported, she does not feel the pt will be safe if discharged.*"  PHQ 2-9:   Flowsheet Row  Admission (Discharged) from 08/15/2018 in [facility] ED from 08/14/2018 in [place] DEPT  C-SSRS RISK CATEGORY High Risk High Risk       Total Time spent with patient: 30 minutes  Musculoskeletal  Strength & Muscle Tone: within normal limits Gait & Station: normal Patient leans: N/A  Psychiatric Specialty Exam  Presentation General Appearance: Appropriate for Environment; Disheveled  Eye Contact:Good  Speech:Clear and Coherent; Normal Rate  Speech Volume:Normal  Handedness:No data recorded  Mood and Affect  Mood:Depressed  Affect:Congruent   Thought Process  Thought Processes:Coherent; Goal Directed; Linear  Descriptions of Associations:Intact  Orientation:Full (Time, Place and Person)  Thought Content:Logical; WDL    Hallucinations:Hallucinations: None  Ideas of Reference:None  Suicidal Thoughts:Suicidal Thoughts: Yes, Active SI Active Intent and/or Plan: Without Intent; Without Plan; With Access to Means (Patient states that he does not think that he has means to carry out but is not very adamant when he makes a statement.)  Homicidal Thoughts:Homicidal Thoughts: -- (Patient endorses fleeting HI, but does not specify who the HI is towards.)   Sensorium  Memory:Immediate Fair; Recent Fair; Remote Fair  Judgment:Fair  Insight:Fair   Executive Functions  Concentration:Fair  Attention Span:Fair  [place]   Psychomotor Activity  Psychomotor Activity:Psychomotor Activity: Normal   Assets  Assets:Communication Skills; Desire for Improvement; Housing; Leisure Time; Physical Health; Resilience   Sleep  Sleep:Sleep: Poor   Nutritional Assessment (For OBS and FBC admissions only) Has the patient had a weight loss or gain of 10 pounds or more in the last 3 months?: No Has the patient had a decrease in food intake/or appetite?: No Does the  patient have dental problems?: No Does the patient have eating habits or behaviors that may be indicators of an eating disorder including binging or inducing vomiting?: No Has the patient recently lost weight without trying?: No Has the patient been eating poorly because of a decreased appetite?: No Malnutrition Screening Tool Score: 0    Physical Exam Vitals reviewed.  Constitutional:      General: He is not in acute distress.    Appearance: He is not ill-appearing, toxic-appearing or diaphoretic.  HENT:     Head: Normocephalic and atraumatic.     Right Ear: External ear normal.     Left Ear: External ear normal.     Mouth/Throat:     Comments: Patient is missing multiple teeth. Cardiovascular:     Rate and Rhythm: Normal rate.  Pulmonary:     Effort: Pulmonary effort is normal. No respiratory distress.  Musculoskeletal:        General: Normal range of motion.     Cervical back: Normal range of motion.  Neurological:     Mental Status: He is alert and oriented to person, place, and time.     Comments: No tremor noted.  Psychiatric:        Attention and Perception: Attention normal. He does not perceive auditory or visual hallucinations.        Mood and Affect: Mood is depressed.  Speech: Speech normal.        Behavior: Behavior is not agitated, slowed, aggressive, withdrawn, hyperactive or combative. Behavior is cooperative.        Thought Content: Thought content is not paranoid or delusional. Thought content includes homicidal and suicidal ideation. Thought content does not include suicidal plan.     Comments: Affect mood congruent.     Review of Systems  Constitutional: Positive for malaise/fatigue. Negative for chills, diaphoresis, fever and weight loss.  HENT: Negative for congestion.   Respiratory: Negative for cough and shortness of breath.   Cardiovascular: Negative for chest pain and palpitations.  Gastrointestinal: Negative for abdominal pain, constipation,  diarrhea, nausea and vomiting.  Musculoskeletal: Negative for joint pain and myalgias.  Neurological: Positive for seizures. Negative for dizziness, tremors and headaches.  Psychiatric/Behavioral: Positive for depression, substance abuse and suicidal ideas. Negative for hallucinations and memory loss. The patient has insomnia. The patient is not nervous/anxious.   All other systems reviewed and are negative.   Vitals: Blood pressure (!) 136/91, pulse 81, temperature 98.5 F (36.9 C), temperature source Oral, resp. rate 18, SpO2 97 %. There is no height or weight on file to calculate BMI.  Past Psychiatric History: Alcohol induced mood disorder, alcohol use disorder  Is the patient at risk to self? Yes  Has the patient been a risk to self in the past 6 months? Yes .    Has the patient been a risk to self within the distant past? Yes   Is the patient a risk to others? No   Has the patient been a risk to others in the past 6 months? No   Has the patient been a risk to others within the distant past? No   Past Medical History:  Past Medical History:  Diagnosis Date  . Allergy   . Anemia   . Anxiety   . Arthritis    RA  . Complication of anesthesia    hard to wake up-was told he may have sleep apnea-never tested  . Depression   . Diabetes mellitus without complication ([place])    with weight no more problems  . Seizure ([place])    had one seizure of unknown etiology a few tears ago, none since and on no meds for them  . Sleep apnea   . Snores     Past Surgical History:  Procedure Laterality Date  . BIOPSY  02/12/2019   Procedure: BIOPSY;  Surgeon: Daneil Dolin, MD;  [facility];  Service: Endoscopy;;  stomach  . COLONOSCOPY WITH PROPOFOL N/A 02/12/2019   Procedure: COLONOSCOPY WITH PROPOFOL;  Surgeon: Daneil Dolin, MD;  [facility];  Service: Endoscopy;  Laterality: N/A;  10:30am  . ELBOW SURGERY Right 2010   Dr. Percell Miller  . ESOPHAGOGASTRODUODENOSCOPY  (EGD) WITH PROPOFOL N/A 02/12/2019   Procedure: ESOPHAGOGASTRODUODENOSCOPY (EGD) WITH PROPOFOL;  Surgeon: Daneil Dolin, MD;  [facility];  Service: Endoscopy;  Laterality: N/A;  . HERNIA REPAIR     Right inguinal  . KNEE ARTHROSCOPY WITH MEDIAL MENISECTOMY Right 06/11/2013   Procedure: RIGHT KNEE ARTHROSCOPY WITH MEDIAL MENISECTOMY AND CHONDROPLASTY;  Surgeon: Ninetta Lights, MD;  Location: [place];  Service: Orthopedics;  Laterality: Right;  Chondroplasty, loose bodies, medial menisectomy, lysis of adhesions.  Marland Kitchen KNEE SURGERY Right 2011  . POLYPECTOMY  02/12/2019   Procedure: POLYPECTOMY;  Surgeon: Daneil Dolin, MD;  [facility];  Service: Endoscopy;;  . right  ankle    . SHOULDER SURGERY Right 2010   Dr. Percell Miller  . TOTAL KNEE ARTHROPLASTY Right 08/25/2014   Procedure: TOTAL KNEE ARTHROPLASTY;  Surgeon: Ninetta Lights, MD;  Location: [place];  Service: Orthopedics;  Laterality: Right;    Family History:  Family History  Problem Relation Age of Onset  . Hepatitis Mother   . Cirrhosis Mother        Did not drink  . Pneumonia Father   . Kidney failure Father   . Gastric cancer Brother        ? Gastric CA spread to esophagus  . Colon cancer Neg Hx     Social History:  Social History   Socioeconomic History  . Marital status: Single    Spouse name: Not on file  . Number of children: Not on file  . Years of education: Not on file  . Highest education level: Not on file  Occupational History  . Occupation: unemployed  Tobacco Use  . Smoking status: Current Every Day Smoker    Packs/day: 0.50    Years: 25.00    Pack years: 12.50    Types: Cigarettes  . Smokeless tobacco: Never Used  Vaping Use  . Vaping Use: Never used  Substance and Sexual Activity  . Alcohol use: Yes    Comment: occ  . Drug use: No  . Sexual activity: Not Currently  Other Topics Concern  . Not on file  Social History Narrative  . Not on file   Social  Determinants of Health   Financial Resource Strain: Not on file  Food Insecurity: Not on file  Transportation Needs: Not on file  Physical Activity: Not on file  Stress: Not on file  Social Connections: Not on file  Intimate Partner Violence: Not on file    SDOH:  SDOH Screenings   Alcohol Screen: Not on file  Depression [id number]): Not on file  Financial Resource Strain: Not on file  Food Insecurity: Not on file  Housing: Not on file  Physical Activity: Not on file  Social Connections: Not on file  Stress: Not on file  Tobacco Use: High Risk  . Smoking Tobacco Use: Current Every Day Smoker  . Smokeless Tobacco Use: Never Used  Transportation Needs: Not on file    Last Labs:  Admission on 05/16/2020  Component Date Value Ref Range Status  . SARS Coronavirus 2 by RT PCR 05/17/2020 NEGATIVE  NEGATIVE Final   Comment: (NOTE) SARS-CoV-2 target nucleic acids are NOT DETECTED.  The SARS-CoV-2 RNA is generally detectable in upper respiratory specimens during the acute phase of infection. The lowest concentration of SARS-CoV-2 viral copies this assay can detect is 138 copies/mL. A negative result does not preclude SARS-Cov-2 infection and should not be used as the sole basis for treatment or other patient management decisions. A negative result may occur with  improper specimen collection/handling, submission of specimen other than nasopharyngeal swab, presence of viral mutation(s) within the areas targeted by this assay, and inadequate number of viral copies(<138 copies/mL). A negative result must be combined with clinical observations, patient history, and epidemiological information. The expected result is Negative.  Fact Sheet for Patients:  EntrepreneurPulse.com.au  Fact Sheet for Healthcare Providers:  IncredibleEmployment.be  This test is no                          t yet approved or cleared by the [place] FDA and  has been  authorized for detection and/or diagnosis of SARS-CoV-2 by FDA under an Emergency Use Authorization (EUA). This EUA will remain  in effect (meaning this test can be used) for the duration of the COVID-19 declaration under Section 564(b)(1) of the Act, 21 U.S.C.section 360bbb-3(b)(1), unless the authorization is terminated  or revoked sooner.      . Influenza A by PCR 05/17/2020 NEGATIVE  NEGATIVE Final  . Influenza B by PCR 05/17/2020 NEGATIVE  NEGATIVE Final   Comment: (NOTE) The Xpert Xpress SARS-CoV-2/FLU/RSV plus assay is intended as an aid in the diagnosis of influenza from Nasopharyngeal swab specimens and should not be used as a sole basis for treatment. Nasal washings and aspirates are unacceptable for Xpert Xpress SARS-CoV-2/FLU/RSV testing.  Fact Sheet for Patients: EntrepreneurPulse.com.au  Fact Sheet for Healthcare Providers: IncredibleEmployment.be  This test is not yet approved or cleared by the [place] FDA and has been authorized for detection and/or diagnosis of SARS-CoV-2 by FDA under an Emergency Use Authorization (EUA). This EUA will remain in effect (meaning this test can be used) for the duration of the COVID-19 declaration under Section 564(b)(1) of the Act, 21 U.S.C. section 360bbb-3(b)(1), unless the authorization is terminated or revoked.  Performed at [facility], [place] [address] [place], [place] [address]   . WBC 05/17/2020 5.9  4.0 - 10.5 K/uL Final  . RBC 05/17/2020 5.02  4.22 - 5.81 MIL/uL Final  . Hemoglobin 05/17/2020 16.3  13.0 - 17.0 g/dL Final  . HCT 05/17/2020 46.7  39.0 - 52.0 % Final  . MCV 05/17/2020 93.0  80.0 - 100.0 fL Final  . MCH 05/17/2020 32.5  26.0 - 34.0 pg Final  . MCHC 05/17/2020 34.9  30.0 - 36.0 g/dL Final  . RDW 05/17/2020 12.3  11.5 - 15.5 % Final  . Platelets 05/17/2020 126* 150 - 400 K/uL Final  . nRBC 05/17/2020 0.0  0.0 - 0.2 % Final  . Neutrophils Relative %  05/17/2020 56  % Final  . Neutro Abs 05/17/2020 3.3  1.7 - 7.7 K/uL Final  . Lymphocytes Relative 05/17/2020 36  % Final  . Lymphs Abs 05/17/2020 2.1  0.7 - 4.0 K/uL Final  . Monocytes Relative 05/17/2020 6  % Final  . Monocytes Absolute 05/17/2020 0.4  0.1 - 1.0 K/uL Final  . Eosinophils Relative 05/17/2020 1  % Final  . Eosinophils Absolute 05/17/2020 0.1  0.0 - 0.5 K/uL Final  . Basophils Relative 05/17/2020 1  % Final  . Basophils Absolute 05/17/2020 0.1  0.0 - 0.1 K/uL Final  . Immature Granulocytes 05/17/2020 0  % Final  . Abs Immature Granulocytes 05/17/2020 0.01  0.00 - 0.07 K/uL Final   Performed at [facility], [place] [address] [place], [place] [address]  . Sodium 05/17/2020 135  135 - 145 mmol/L Final  . Potassium 05/17/2020 3.0* 3.5 - 5.1 mmol/L Final  . Chloride 05/17/2020 95* 98 - 111 mmol/L Final  . CO2 05/17/2020 26  22 - 32 mmol/L Final  . Glucose, Bld 05/17/2020 122* 70 - 99 mg/dL Final   Glucose reference range applies only to samples taken after fasting for at least 8 hours.  . BUN 05/17/2020 8  6 - 20 mg/dL Final  . Creatinine, Ser 05/17/2020 0.78  0.61 - 1.24 mg/dL Final  . Calcium 05/17/2020 8.9  8.9 - 10.3 mg/dL Final  . Total Protein 05/17/2020 7.6  6.5 - 8.1 g/dL Final  . Albumin 05/17/2020 4.1  3.5 - 5.0 g/dL  Final  . AST 05/17/2020 141* 15 - 41 U/L Final  . ALT 05/17/2020 65* 0 - 44 U/L Final  . Alkaline Phosphatase 05/17/2020 107  38 - 126 U/L Final  . Total Bilirubin 05/17/2020 1.6* 0.3 - 1.2 mg/dL Final  . GFR, Estimated 05/17/2020 >60  >60 mL/min Final   Comment: (NOTE) Calculated using the CKD-EPI Creatinine Equation (2021)   . Anion gap 05/17/2020 14  5 - 15 Final   Performed at [place] [address] [place], [place] [address]  . Alcohol, Ethyl (B) 05/17/2020 188* <10 mg/dL Final   Comment: (NOTE) Lowest detectable limit for serum alcohol is 10 mg/dL.  For medical purposes only. Performed at [facility], [place]  [address], [place], [place] [address]   . POC Amphetamine UR 05/17/2020 Positive* NONE DETECTED (Cut Off Level 1000 ng/mL) Final  . POC Secobarbital (BAR) 05/17/2020 None Detected  NONE DETECTED (Cut Off Level 300 ng/mL) Final  . POC Buprenorphine (BUP) 05/17/2020 None Detected  NONE DETECTED (Cut Off Level 10 ng/mL) Final  . POC Oxazepam (BZO) 05/17/2020 None Detected  NONE DETECTED (Cut Off Level 300 ng/mL) Final  . POC Cocaine UR 05/17/2020 None Detected  NONE DETECTED (Cut Off Level 300 ng/mL) Final  . POC Methamphetamine UR 05/17/2020 None Detected  NONE DETECTED (Cut Off Level 1000 ng/mL) Final  . POC Morphine 05/17/2020 None Detected  NONE DETECTED (Cut Off Level 300 ng/mL) Final  . POC Oxycodone UR 05/17/2020 None Detected  NONE DETECTED (Cut Off Level 100 ng/mL) Final  . POC Methadone UR 05/17/2020 None Detected  NONE DETECTED (Cut Off Level 300 ng/mL) Final  . POC Marijuana UR 05/17/2020 None Detected  NONE DETECTED (Cut Off Level 50 ng/mL) Final  . SARSCOV2ONAVIRUS 2 AG 05/17/2020 NEGATIVE  NEGATIVE Final   Comment: (NOTE) SARS-CoV-2 antigen NOT DETECTED.   Negative results are presumptive.  Negative results do not preclude SARS-CoV-2 infection and should not be used as the sole basis for treatment or other patient management decisions, including infection  control decisions, particularly in the presence of clinical signs and  symptoms consistent with COVID-19, or in those who have been in contact with the virus.  Negative results must be combined with clinical observations, patient history, and epidemiological information. The expected result is Negative.  Fact Sheet for Patients: HandmadeRecipes.com.cy  Fact Sheet for Healthcare Providers: FuneralLife.at  This test is not yet approved or cleared by the [place] FDA and  has been authorized for detection and/or diagnosis of SARS-CoV-2 by FDA under an Emergency Use Authorization  (EUA).  This EUA will remain in effect (meaning this test can be used) for the duration of  the COV                          ID-19 declaration under Section 564(b)(1) of the Act, 21 U.S.C. section 360bbb-3(b)(1), unless the authorization is terminated or revoked sooner.    Admission on 01/21/2020, Discharged on 01/21/2020  Component Date Value Ref Range Status  . Sodium 01/21/2020 140  135 - 145 mmol/L Final  . Potassium 01/21/2020 4.1  3.5 - 5.1 mmol/L Final  . Chloride 01/21/2020 106  98 - 111 mmol/L Final  . CO2 01/21/2020 23  22 - 32 mmol/L Final  . Glucose, Bld 01/21/2020 73  70 - 99 mg/dL Final   Glucose reference range applies only to samples taken after fasting for at least 8 hours.  Marland Kitchen  BUN 01/21/2020 9  6 - 20 mg/dL Final  . Creatinine, Ser 01/21/2020 0.82  0.61 - 1.24 mg/dL Final  . Calcium 01/21/2020 8.5* 8.9 - 10.3 mg/dL Final  . GFR, Estimated 01/21/2020 >60  >60 mL/min Final   Comment: (NOTE) Calculated using the CKD-EPI Creatinine Equation (2021)   . Anion gap 01/21/2020 11  5 - 15 Final   Performed at [facility], [place] [address] [place], [place] [address]  . WBC 01/21/2020 6.2  4.0 - 10.5 K/uL Final  . RBC 01/21/2020 5.10  4.22 - 5.81 MIL/uL Final  . Hemoglobin 01/21/2020 16.9  13.0 - 17.0 g/dL Final  . HCT 01/21/2020 48.9  39.0 - 52.0 % Final  . MCV 01/21/2020 95.9  80.0 - 100.0 fL Final  . MCH 01/21/2020 33.1  26.0 - 34.0 pg Final  . MCHC 01/21/2020 34.6  30.0 - 36.0 g/dL Final  . RDW 01/21/2020 12.0  11.5 - 15.5 % Final  . Platelets 01/21/2020 139* 150 - 400 K/uL Final  . nRBC 01/21/2020 0.0  0.0 - 0.2 % Final  . Neutrophils Relative % 01/21/2020 62  % Final  . Neutro Abs 01/21/2020 3.9  1.7 - 7.7 K/uL Final  . Lymphocytes Relative 01/21/2020 27  % Final  . Lymphs Abs 01/21/2020 1.7  0.7 - 4.0 K/uL Final  . Monocytes Relative 01/21/2020 8  % Final  . Monocytes Absolute 01/21/2020 0.5  0.1 - 1.0 K/uL Final  . Eosinophils Relative  01/21/2020 2  % Final  . Eosinophils Absolute 01/21/2020 0.1  0.0 - 0.5 K/uL Final  . Basophils Relative 01/21/2020 1  % Final  . Basophils Absolute 01/21/2020 0.1  0.0 - 0.1 K/uL Final  . Immature Granulocytes 01/21/2020 0  % Final  . Abs Immature Granulocytes 01/21/2020 0.02  0.00 - 0.07 K/uL Final   Performed at [facility], [place] [address], [place], [place] [address]  . Alcohol, Ethyl (B) 01/21/2020 129* <10 mg/dL Final   Comment: (NOTE) Lowest detectable limit for serum alcohol is 10 mg/dL.  For medical purposes only. Performed at [facility], [place] [address], [place], [place] [address]     Allergies: Daypro [oxaprozin], Diclofenac sodium, Indocin [indomethacin], and Zanaflex [tizanidine hcl]  PTA Medications: (Not in a hospital admission)   Medical Decision Making  Patient is a 55 year old male with a past history of alcohol induced mood disorder and alcohol use disorder who presents to the behavioral health urgent care voluntarily for SI with no plan but with access to means (firearms).  Based on patient's presentation, obtained collateral information, TTS assessment, and my assessment, believe that the patient is a potential threat to himself at this time and I recommend continuous observation/assessment for the patient.    Recommendations  Based on my evaluation the patient does not appear to have an emergency medical condition.  Patient will be placed in [facility] continuous observation/assessment for further stabilization and treatment.  Patient will be reevaluated by the treatment team on 05/17/2020 and disposition to be determined at that time. Labs ordered and reviewed:  -PCR COVID: Negative  -UDS: Positive for amphetamine  -CBC with differential: Unremarkable  -CMP: Potassium reduced at 3.0 mmol/L, AST elevated at 141 U/L and ALT elevated at 65 U/L (based on past lab values, these levels appear to be chronically elevated in nature).  CMP  otherwise unremarkable  -Ethanol level: 188 mg/dL  We will continue the following home medications:  -Prozac 20 mg p.o.  daily  Patient not appearing to be experiencing alcohol withdrawal at this time.  Will initiate CIWA protocol for future potential alcohol withdrawal symptoms with the following:  -Thiamine tablet 100 mg p.o. daily  -Zofran ODT 4 mg p.o. every 6 hours as needed for nausea/vomiting  -Multivitamin with minerals p.o. daily  -Imodium capsule 2 to 4 mg p.o. as needed for diarrhea or loose stools  -Vistaril 25 mg p.o. every 6 hours as needed for anxiety or CIWA score less than or equal to 10  -Gabapentin 300 mg p.o. 3 times daily for alcohol withdrawal symptoms  -Ativan 1 mg p.o. every 6 hours as needed for CIWA score greater than 10  Trazodone 50 mg p.o. at bedtime as needed ordered for sleep Potassium chloride p.o. 40 mEq twice daily for 1 day ordered to address patient's hypokalemia   Pres[place]illa Sours, PA-C 05/17/20  6:26 AM

## 2020-05-17 NOTE — [facility] Suicide Risk Assessment (Signed)
[facility] Admission Suicide Risk Assessment   Nursing information obtained from:  Patient Demographic factors:  Living alone,Caucasian,Unemployed,Low socioeconomic status Current Mental Status:  Suicidal ideation indicated by patient,Intention to act on suicide plan,Suicidal ideation indicated by others,Self-harm thoughts Loss Factors:  Decrease in vocational status,Financial problems / change in socioeconomic status,Decline in physical health Historical Factors:  Impulsivity Risk Reduction Factors:  Sense of responsibility to family,Positive social support,Positive therapeutic relationship  Total Time spent with patient: 30 minutes Principal Problem: <principal problem not specified> Diagnosis:  Active Problems:   Substance induced mood disorder ([place])  Subjective Data: Patient is seen and examined.  Patient is a 55 year old male with a past psychiatric history significant for alcohol dependence who presented to the [facility] on 04/2020 with law enforcement for suicidal ideation.  The patient stated that he is sister called the police on the date of admission because he had made a suicidal statement.  The patient stated that "I did not really mean it".  He is frustrated and struggling.  He has been drinking approximately a case of beer a day.  He has been unemployed, and that made things worse.  He is getting out of a bad relationship with a male.  He has had some worsening medical problems including a previous head injury and postconcussion syndrome.  He had a psychiatric hospitalization for similar circumstances at [facility] in 01/09/2020.  He stated he only stayed sober for a few days after that, and did not go to rehab.  Shortly after that he suffered a subdural hematoma and was in the [facility] emergency department on 01/21/2020 with that.  He has followed up as an outpatient with neuro.  He admitted that he had had a  seizure approximately 3 years ago.  This was an alcohol-related seizure.  He is also prescribed clonazepam 1 mg p.o. twice daily and Adderall 20 mg p.o. daily by his primary care provider.  There is also hydrocodone prescription from 03/31/2020.  He admitted to helplessness, hopelessness and worthlessness.  He denied suicidal ideation.  He is considering going to a rehabilitation facility.  He has not decided at this point.  He was admitted to the hospital for evaluation and stabilization.  Continued Clinical Symptoms:  Alcohol Use Disorder Identification Test Final Score (AUDIT): 27 The "Alcohol Use Disorders Identification Test", Guidelines for Use in Primary Care, Second Edition.  World Pharmacologist [facility]). Score between 0-7:  no or low risk or alcohol related problems. Score between 8-15:  moderate risk of alcohol related problems. Score between 16-19:  high risk of alcohol related problems. Score 20 or above:  warrants further diagnostic evaluation for alcohol dependence and treatment.   CLINICAL FACTORS:   Depression:   Anhedonia Comorbid alcohol abuse/dependence Hopelessness Impulsivity Insomnia Alcohol/Substance Abuse/Dependencies   Musculoskeletal: Strength & Muscle Tone: within normal limits Gait & Station: normal Patient leans: N/A  Psychiatric Specialty Exam:  Presentation  General Appearance: Disheveled  Eye Contact:Fair  Speech:Normal Rate  Speech Volume:Decreased  Handedness:Right   Mood and Affect  Mood:Depressed; Dysphoric  Affect:Flat   Thought Process  Thought Processes:Coherent  Descriptions of Associations:Intact  Orientation:Full (Time, Place and Person)  Thought Content:Logical  History of Schizophrenia/Schizoaffective disorder:No data recorded Duration of Psychotic Symptoms:No data recorded Hallucinations:Hallucinations: None  Ideas of Reference:None  Suicidal Thoughts:Suicidal Thoughts: No SI Active Intent and/or Plan: Without  Intent; Without Plan; With Access to Means (Patient states that he does not think that he  has means to carry out but is not very adamant when he makes a statement.)  Homicidal Thoughts:Homicidal Thoughts: No   Sensorium  Memory:Immediate Fair; Recent Fair; Remote Fair  Judgment:Fair  Insight:Fair   Executive Functions  Concentration:Fair  Attention Span:Fair  [place]   Psychomotor Activity  Psychomotor Activity:Psychomotor Activity: Normal   Assets  Assets:Desire for Improvement; Resilience; Housing   Sleep  Sleep:Sleep: Fair    Physical Exam: Physical Exam Vitals and nursing note reviewed.  Constitutional:      Appearance: Normal appearance.  HENT:     Head: Normocephalic and atraumatic.  Pulmonary:     Effort: Pulmonary effort is normal.  Neurological:     General: No focal deficit present.     Mental Status: He is alert and oriented to person, place, and time.    ROS Blood pressure 132/80, pulse (!) 50, temperature 98.1 F (36.7 C), temperature source Oral, resp. rate 18, height 6\' 2"  (1.88 m), weight 80.3 kg, SpO2 99 %. Body mass index is 22.73 kg/m.   COGNITIVE FEATURES THAT CONTRIBUTE TO RISK:  None    SUICIDE RISK:   Mild:  Suicidal ideation of limited frequency, intensity, duration, and specificity.  There are no identifiable plans, no associated intent, mild dysphoria and related symptoms, good self-control (both objective and subjective assessment), few other risk factors, and identifiable protective factors, including available and accessible social support.  PLAN OF CARE: Patient is seen and examined.  Patient is a 55 year old male with the above-stated past psychiatric history who was admitted with alcohol dependence, substance-induced mood disorder, alcohol withdrawal.  He will be admitted to the hospital.  He will be integrated in the milieu.  He will be encouraged to attend groups.  He will be  placed on lorazepam 1 mg p.o. every 6 hours as needed and CIWA greater than 10.  He will also be placed on folic acid as well as thiamine.  Given his seizure history I am going to go on and placing on Keppra 500 mg p.o. twice daily.  I am also going to place him on seizure precautions.  The staff at the behavioral health urgent care center also placed him on fluoxetine 20 mg p.o. daily as well as Neurontin 300 mg p.o. 3 times daily.  We will continue that.  He will also have hydroxyzine for anxiety as well as trazodone for sleep.  He does complain of trauma to his right foot, and examination showed a significantly bruised foot.  We will go on to get that x-rayed to make sure there is no fractures.  Review of his admission laboratories revealed a low potassium at 3.0 and that will be supplemented.  His blood sugar was mildly elevated 122.  His AST was elevated at 141 and his ALT at 65.  These are more elevated than previously but not that much.  CBC was essentially normal including his MCV.  His platelet count is slightly low at 126,000.  His last platelet count from 3 months ago was 139,000, and 9 months ago was as low as 86,000.  This is most likely secondary to alcohol.  Differential was normal.  Influenza panel was negative for influenza A, B and coronavirus.  Blood alcohol on admission was 188.  His drug screen was positive for amphetamines, but he does have a prescription for that.  We discussed the risk of seizures taking alcohol as well as amphetamines.  Currently his vital signs are stable, he  is afebrile.  Pulse oximetry on room air was 99%.  I certify that inpatient services furnished can reasonably be expected to improve the patient's condition.   Sharma Covert, MD 05/17/2020, 2:15 PM

## 2020-05-17 NOTE — Discharge Instructions (Signed)
Transfer to bhh °

## 2020-05-17 NOTE — Progress Notes (Signed)
Patient alert and oriented, Denies SI, HI and AVH this morning. Patient states, " I was just drinking a lot and I have a lot of things to happen to me over the past year." Patient received scheduled medications.  Nursing staff will continue to monitor.

## 2020-05-17 NOTE — BH Assessment (Addendum)
Brandon Miller is a 55 year old male who presents voluntary and unaccompanied to [place]. Pt has fleeting suicidal and homicidal ideations. Pt reports, he no reasons to live but access to multiple fire arms. Pt reports, drinking too much alcohol. Pt reported, he will never hurt himself or others.    Pt's sister Brandon Miller, [phone]) wants staff to call her with updates. Clinician expressed staff can not provide updates as he's his own guardian. Clinician expressed staff can ask the pt if it's okay to call her with updates however he can decline.   Vertell Novak, [place], [facility], [facility] Triage Specialist [phone]

## 2020-05-17 NOTE — Progress Notes (Signed)
Patient was accepted to [facility] .  Meets inpatient criteria per Dr. Serafina Mitchell.    Attending physician is Dr. Mallie Darting.    Notified Jerry Caras, RN of acceptance.  Nurses call report to [phone].    Patient can arrive at 12 noon 05/17/2020.    Assunta Curtis, MSW, LCSW 05/17/2020 11:04 AM

## 2020-05-17 NOTE — Progress Notes (Signed)
Psychoeducational Group Note  Date:  05/17/2020 Time:  2042  Group Topic/Focus:  Wrap-Up Group:   The focus of this group is to help patients review their daily goal of treatment and discuss progress on daily workbooks.  Participation Level: Did Not Attend  Participation Quality:  Not Applicable  Affect:  Not Applicable  Cognitive:  Not Applicable  Insight:  Not Applicable  Engagement in Group: Not Applicable  Additional Comments:  The patient did not attend group.   [name] S 05/17/2020, 8:42 PM

## 2020-05-17 NOTE — ED Notes (Signed)
Breakfast given.  

## 2020-05-17 NOTE — BH Assessment (Signed)
Comprehensive Clinical Assessment (CCA) Note  05/17/2020 Brandon Miller [id number]  Disposition: Brandon John, PA-C recommends pt to be admitted to [facility] Observation Unit.  The patient demonstrates the following risk factors for suicide: Chronic risk factors for suicide include: psychiatric disorder of Depression and Anxiety. Acute risk factors for suicide include: Fleeting suicidal and homicidal thoughts. Protective factors for this patient include: positive social support. Considering these factors, the overall suicide risk at this point appears to be moderate. Patient is appropriate for outpatient follow up.  Brandon Miller Study is a 55 year old male who presents voluntary and unaccompanied to [place]. Clinician asked the pt, "what brought you to the hospital?" Pt reported, he's mentally unstable, having bad thoughts going through his head so he called his sister to get help. Pt reported, his sister called the Sheriff's Department and was brought to [facility]. Pt reported, "I want to die, take someone else's life." Pt reported, fleeting suicidal and homicidal thoughts with access to multiple fire arms, he knows a lot of people but doesn't think he'll do it. Pt denies, AVH, self-injurious behaviors.   Pt reported, drinking five Budweiser can beer before coming to [facility]. Pt reported drinking everyday. Pt reported, he is prescribed Adderall, Klonopin and Prozac. Per sister, pt went to [facility] for detox on a Friday, he was discharged the next day and continue drinking beer.   Pt presents alert with normal speech. Pt's mood, affect was depressed. Pt's thought content was appropriate to mood and circumstances. Pt reports, if discharged from [facility] he can contract for safety.  Diagnosis: Alcohol Induced Mood Disorder with Depressive symptoms (HCC)                   Alcohol use Disorder, severe, dependence ([place])   *Pt consented to contact his sister Brandon Miller, [phone]) to  gather additional information. Per sister, the pt has been with mental illness for several years, pt has no motivation or job. Per sister, years ago the pt asked her to come over and pull the trigger because he can not do it. Pt's sister reported, the pt lives alone, she has offer for the pt to stay with her but he declined. Pt's sister reports, she and her husband buys the pt food as he does not, pays some of his bills. Pt's sister reported, she does not feel the pt will be safe if discharged.*   Chief Complaint:  Chief Complaint  Patient presents with  . Suicidal  . Homicidal  . Alcohol Problem   Visit Diagnosis:     CCA Screening, Triage and Referral (STR)  Patient Reported Information How did you hear about [place]? Legal System  Referral name: Sheriff's Department.  Referral phone number: 911   Whom do you see for routine medical problems? Primary Care  Practice/Facility Name: [facility].  Practice/Facility Phone Number: [phone]  Name of Contact: [facility].  Contact Number: [phone]  Contact Fax Number: No data recorded Prescriber Name: [facility].  Prescriber Address (if known): [address] [place], [place], [place] [address]   What Is the Reason for Your Visit/Call Today? Sheriff's Department.  How Long Has This Been Causing You Problems? No data recorded What Do You Feel Would Help You the Most Today? Treatment for Depression or other mood problem; Alcohol or Drug Use Treatment   Have You Recently Been in Any Inpatient Treatment (Hospital/Detox/Crisis Center/28-Day Program)? Yes  Name/Location of Program/Hospital:[facility] in [place].  How Long  Were You There? No data recorded When Were You Discharged? No data recorded  Have You Ever Received Services From [facility] Before? Yes  Who Do You See at [facility]? Pt has previous ED visits.   Have You Recently Had Any Thoughts About Hurting Yourself?  Yes  Are You Planning to Commit Suicide/Harm Yourself At This time? No (Pt denies.)   Have you Recently Had Thoughts About [employer]? Yes  Explanation: No data recorded  Have You Used Any Alcohol or Drugs in the Past 24 Hours? Yes  How Long Ago Did You Use Drugs or Alcohol? No data recorded What Did You Use and How Much? No data recorded  Do You Currently Have a Therapist/Psychiatrist? No data recorded Name of Therapist/Psychiatrist: No data recorded  Have You Been Recently Discharged From Any Office Practice or Programs? No data recorded Explanation of Discharge From Practice/Program: No data recorded    CCA Screening Triage Referral Assessment Type of Contact: Face-to-Face  Is this Initial or Reassessment? No data recorded Date Telepsych consult ordered in CHL:  No data recorded Time Telepsych consult ordered in CHL:  No data recorded  Patient Reported Information Reviewed? No data recorded Patient Left Without Being Seen? No data recorded Reason for Not Completing Assessment: No data recorded  Collateral Involvement: Brandon Miller, sister, [phone].   Does Patient Have a Stage manager Guardian? No data recorded Name and Contact of Legal Guardian: No data recorded If Minor and Not Living with Parent(s), Who has Custody? No data recorded Is CPS involved or ever been involved? No data recorded Is APS involved or ever been involved? No data recorded  Patient Determined To Be At Risk for Harm To Self or Others Based on Review of Patient Reported Information or Presenting Complaint? No data recorded Method: No data recorded Availability of Means: No data recorded Intent: No data recorded Notification Required: No data recorded Additional Information for Danger to Others Potential: No data recorded Additional Comments for Danger to Others Potential: No data recorded Are There Guns or Other Weapons in Your Home? No data recorded Types of  Guns/Weapons: No data recorded Are These Weapons Safely Secured?                            No data recorded Who Could Verify You Are Able To Have These Secured: No data recorded Do You Have any Outstanding Charges, Pending Court Dates, Parole/Probation? No data recorded Contacted To Inform of Risk of Harm To Self or Others: No data recorded  Location of Assessment: [facility] Assessment Services   Does Patient Present under Involuntary Commitment? No  IVC Papers Initial File Date: No data recorded  [place] of Residence: No data recorded  Patient Currently Receiving the Following Services: Individual Therapy; Medication Management; Partial Hospitalization   Determination of Need: Urgent (48 hours)   Options For Referral: Medication Management; Inpatient Hospitalization; [place] [facility]; Partial Hospitalization     CCA Biopsychosocial Intake/Chief Complaint:  Pt has fleeting suicidal and homicial ideations wtih access to means. Pt has a history of substance use.  Current Symptoms/Problems: No data recorded  Patient Reported Schizophrenia/Schizoaffective Diagnosis in Past: No data recorded  Strengths: Not assessed.  Preferences: Not assessed.  Abilities: Not assessed.   Type of Services Patient Feels are Needed: Pt reports, he can contract for safety. Per sister, she does not feel the pt can contract for safety.   Initial Clinical Notes/Concerns: No  data recorded  Mental Health Symptoms Depression:  Irritability; Hopelessness; Worthlessness; Weight gain/loss; Fatigue; Difficulty Concentrating   Duration of Depressive symptoms: No data recorded  Mania:  No data recorded  Anxiety:   Worrying; Tension   Psychosis:  None   Duration of Psychotic symptoms: No data recorded  Trauma:  -- (UTA)   Obsessions:  -- (UTA)   Compulsions:  -- (UTA)   Inattention:  Disorganized   Hyperactivity/Impulsivity:  Feeling of restlessness; Fidgets with hands/feet    Oppositional/Defiant Behaviors:  Aggression towards people/animals   Emotional Irregularity:  Recurrent suicidal behaviors/gestures/threats   Other Mood/Personality Symptoms:  No data recorded   Mental Status Exam Appearance and self-care  Stature:  Tall   Weight:  Average weight   Clothing:  Casual   Grooming:  Normal   Cosmetic use:  None   Posture/gait:  Normal   Motor activity:  Not Remarkable   Sensorium  Attention:  Normal   Concentration:  Normal   Orientation:  X5   Recall/memory:  Normal   Affect and Mood  Affect:  Depressed   Mood:  Depressed   Relating  Eye contact:  Normal   Facial expression:  Depressed   Attitude toward examiner:  Cooperative   Thought and Language  Speech flow: Normal   Thought content:  Appropriate to Mood and Circumstances   Preoccupation:  None   Hallucinations:  None   Organization:  No data recorded  [employer] of Knowledge:  Fair   Intelligence:  No data recorded  Abstraction:  No data recorded  Judgement:  Poor   Reality Testing:  No data recorded  Insight:  Fair   Decision Making:  No data recorded  Social Functioning  Social Maturity:  No data recorded  Social Judgement:  No data recorded  Stress  Stressors:  Other (Comment)   Coping Ability:  Overwhelmed   Skill Deficits:  Decision making   Supports:  Family     Religion: Religion/Spirituality Are You A Religious Person?: Yes What is Your Religious Affiliation?: Christian  Leisure/Recreation: Leisure / Recreation Do You Have Hobbies?:  (UTA)  Exercise/Diet: Exercise/Diet Do You Exercise?:  (UTA) Do You Follow a Special Diet?:  (UTA) Do You Have Any Trouble Sleeping?: Yes Explanation of Sleeping Difficulties: Pt reported, loosing weight he was 225 now he's 181.   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 Patient's job has been impacted by current illness: Yes Describe how patient's job has been impacted: Not assessed. What is the longest time patient has a held a job?: Per chart, "28 years." Where was the patient employed at that time?: Per chart, "[employer]." Has patient ever been in the [employer]?: No  Education: Education Is Patient Currently Attending School?: No Did Garment/textile technologist From [employer]?: Yes Did Theme park manager?: No   CCA Family/Childhood History Family and Relationship History: Family history Marital status: Single Are you sexually active?: Yes What is your sexual orientation?: Not assessed. Has your sexual activity been affected by drugs, alcohol, medication, or emotional stress?: Not assessed. Does patient have children?: No  Childhood History:  Childhood History By whom was/is the patient raised?: Both parents (Per chart.) Additional childhood history information: Not assessed. Description of patient's relationship with caregiver when they were a child: Not assessed. Patient's description of current relationship with people who raised him/her: Not assessed. How were  you disciplined when you got in trouble as a child/adolescent?: Not assessed. Does patient have siblings?: Yes Number of Siblings: 2 Description of patient's current relationship with siblings: Not assessed. Did patient suffer any verbal/emotional/physical/sexual abuse as a child?: No Did patient suffer from severe childhood neglect?: No Has patient ever been sexually abused/assaulted/raped as an adolescent or adult?: No Witnessed domestic violence?: No Has patient been affected by domestic violence as an adult?:  (NA)  Child/Adolescent Assessment:     CCA Substance Use Alcohol/Drug Use: Alcohol / Drug Use Pain Medications: See MAR Prescriptions: See MAR Over the Counter: See MAR History of alcohol / drug  use?: Yes Substance #1 Name of Substance 1: Alochol. 1 - Age of First Use: UTA 1 - Amount (size/oz): Pt reported, drinking five Budweiser can beer before coming to [facility]. 1 - Frequency: Per pt, "everyday." 1 - Duration: Ongoing. 1 - Last Use / Amount: 05/16/2020 1 - Method of Aquiring: Orally. 1- Route of Use: Purchase.    ASAM's:  Six Dimensions of Multidimensional Assessment  Dimension 1:  Acute Intoxication and/or Withdrawal Potential:   Dimension 1:  Description of individual's past and current experiences of substance use and withdrawal: Pt denies withdrawl symptoms.  Dimension 2:  Biomedical Conditions and Complications:      Dimension 3:  Emotional, Behavioral, or Cognitive Conditions and Complications:  Dimension 3:  Description of emotional, behavioral, or cognitive conditions and complications: Pt reports being diagnosed with Depression and Anxiety.  Dimension 4:  Readiness to Change:     Dimension 5:  Relapse, Continued use, or Continued Problem Potential:     Dimension 6:  Recovery/Living Environment:  Dimension 6:  Recovery/Iiving environment criteria description: Pt wants help.  ASAM Severity Score:    ASAM Recommended Level of Treatment: ASAM Recommended Level of Treatment: Level II Partial Hospitalization Treatment   Substance use Disorder (SUD) Substance Use Disorder (SUD)  Checklist Symptoms of Substance Use: Continued use despite having a persistent/recurrent physical/psychological problem caused/exacerbated by use  Recommendations for Services/Supports/Treatments: Recommendations for Services/Supports/Treatments Recommendations For Services/Supports/Treatments: Other (Comment) ([facility].)  DSM5 Diagnoses: Patient Active Problem List   Diagnosis Date Noted  . Falls 08/10/2019  . Numbness and tingling of upper and lower extremities of both sides 08/10/2019  . Balance problem 08/10/2019  . Medication side effect 08/10/2019  . Alcohol withdrawal  ([place]) 08/10/2019  . GERD (gastroesophageal reflux disease) 02/06/2019  . Attention deficit hyperactivity disorder (ADHD) 11/17/2018  . Tobacco dependence 11/17/2018  . Alcohol use disorder, moderate, dependence ([place]) 11/17/2018  . History of diet-controlled diabetes 11/17/2018  . Elevated LFTs 11/05/2018  . Acute gastroenteritis 10/31/2018  . Sleep apnea   . Generalized abdominal pain   . Non-intractable vomiting   . Diarrhea   . ETOH abuse   . Ileus, unspecified ([place]) 10/29/2018  . Severe recurrent major depression without psychotic features ([place]) 08/15/2018  . Alcohol withdrawal seizure with perceptual disturbance ([place])   . Alcohol abuse with unspecified alcohol-induced disorder ([place]) 02/05/2018  . Transaminasemia 02/05/2018  . Anxiety and depression   . Alcohol withdrawal seizure without complication ([place]) [id number]  . DJD (degenerative joint disease) of knee 08/25/2014  . Diabetes mellitus, type 2 ([place]) 10/31/2011  . Hyperglycemia 10/31/2011  . PNA (pneumonia) 10/30/2011  . LEG PAIN, RIGHT 01/20/2010    Referrals to Alternative Service(s): Referred to Alternative Service(s):   Place:   Date:   Time:    Referred to Alternative Service(s):   Place:   Date:  Time:    Referred to Alternative Service(s):   Place:   Date:   Time:    Referred to Alternative Service(s):   Place:   Date:   Time:     Brandon Miller, [facility]  Comprehensive Clinical Assessment (CCA) Screening, Triage and Referral Note  05/17/2020 Treg Nanny [id number]  Chief Complaint:  Chief Complaint  Patient presents with  . Suicidal  . Homicidal  . Alcohol Problem   Visit Diagnosis:  Patient Reported Information How did you hear about [place]? Legal System   Referral name: Sheriff's Department.   Referral phone number: 911  Whom do you see for routine medical problems? Primary Care   Practice/Facility Name: [facility].   Practice/Facility Phone Number: [phone]   Name  of Contact: [facility].   Contact Number: [phone]   Contact Fax Number: No data recorded  Prescriber Name: [facility].   Prescriber Address (if known): [address] [place], [place], [place] [address]  What Is the Reason for Your Visit/Call Today? Sheriff's Department.  How Long Has This Been Causing You Problems? No data recorded Have You Recently Been in Any Inpatient Treatment (Hospital/Detox/Crisis Center/28-Day Program)? Yes   Name/Location of Program/Hospital:[facility] in [place].   How Long Were You There? No data recorded  When Were You Discharged? No data recorded Have You Ever Received Services From [facility] Before? Yes   Who Do You See at [facility]? Pt has previous ED visits.  Have You Recently Had Any Thoughts About Hurting Yourself? Yes   Are You Planning to Commit Suicide/Harm Yourself At This time?  No (Pt denies.)  Have you Recently Had Thoughts About [place]? Yes   Explanation: No data recorded Have You Used Any Alcohol or Drugs in the Past 24 Hours? Yes   How Long Ago Did You Use Drugs or Alcohol?  No data recorded  What Did You Use and How Much? No data recorded What Do You Feel Would Help You the Most Today? Treatment for Depression or other mood problem; Alcohol or Drug Use Treatment  Do You Currently Have a Therapist/Psychiatrist? No data recorded  Name of Therapist/Psychiatrist: No data recorded  Have You Been Recently Discharged From Any Office Practice or Programs? No data recorded  Explanation of Discharge From Practice/Program:  No data recorded    CCA Screening Triage Referral Assessment Type of Contact: Face-to-Face   Is this Initial or Reassessment? No data recorded  Date Telepsych consult ordered in CHL:  No data recorded  Time Telepsych consult ordered in CHL:  No data recorded Patient Reported Information Reviewed? No data recorded  Patient Left Without Being Seen? No data  recorded  Reason for Not Completing Assessment: No data recorded Collateral Involvement: Brandon Miller, sister, [phone].  Does Patient Have a Stage manager Guardian? No data recorded  Name and Contact of Legal Guardian:  No data recorded If Minor and Not Living with Parent(s), Who has Custody? No data recorded Is CPS involved or ever been involved? No data recorded Is APS involved or ever been involved? No data recorded Patient Determined To Be At Risk for Harm To Self or Others Based on Review of Patient Reported Information or Presenting Complaint? No data recorded  Method: No data recorded  Availability of Means: No data recorded  Intent: No data recorded  Notification Required: No data recorded  Additional Information for Danger to Others Potential:  No data recorded  Additional Comments for Danger  to Others Potential:  No data recorded  Are There Guns or Other Weapons in [place]?  No data recorded   Types of Guns/Weapons: No data recorded   Are These Weapons Safely Secured?                              No data recorded   Who Could Verify You Are Able To Have These Secured:    No data recorded Do You Have any Outstanding Charges, Pending Court Dates, Parole/Probation? No data recorded Contacted To Inform of Risk of Harm To Self or Others: No data recorded Location of Assessment: [facility] Assessment Services  Does Patient Present under Involuntary Commitment? No   IVC Papers Initial File Date: No data recorded  [place] of Residence: No data recorded Patient Currently Receiving the Following Services: Individual Therapy; Medication Management; Partial Hospitalization   Determination of Need: Urgent (48 hours)   Options For Referral: Medication Management; Inpatient Hospitalization; [facility] Urgent Care; Partial Hospitalization   Brandon Miller, [place], [place], [facility], [facility] Triage Specialist [phone]
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
## 2020-05-17 NOTE — Tx Team (Signed)
Initial Treatment Plan 05/17/2020 1:14 PM Brandon Miller DIY:[id number]    PATIENT STRESSORS: Financial difficulties Health problems Substance abuse   PATIENT STRENGTHS: Ability for insight Capable of independent living Communication skills Supportive family/friends Work skills   PATIENT IDENTIFIED PROBLEMS: anxiety  depression  Suicidal ideations  Substance use/abuse               DISCHARGE CRITERIA:  Ability to meet basic life and health needs Improved stabilization in mood, thinking, and/or behavior Motivation to continue treatment in a less acute level of care Need for constant or close observation no longer present  PRELIMINARY DISCHARGE PLAN: Attend aftercare/continuing care group Attend 12-step recovery group Return to previous living arrangement  PATIENT/FAMILY INVOLVEMENT: This treatment plan has been presented to and reviewed with the patient, Brandon Miller.  The patient and family have been given the opportunity to ask questions and make suggestions.  Baron Sane, RN 05/17/2020, 1:14 PM
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
## 2020-05-17 NOTE — Progress Notes (Signed)
Received report from [place], Therapist, sports. Patient at this time resting, eyes closed and respirations are even and unlabored. No objective signs of discomfort. Nursing staff will continue to monitor.

## 2020-05-17 NOTE — Progress Notes (Addendum)
Pt stated he was feeling about the same. Pt visible on the unit some this evening.  Pt given PRN Trazodone and Vistaril per [facility]   05/17/20 2200  Psych Admission Type (Psych Patients Only)  Admission Status Voluntary  Psychosocial Assessment  Patient Complaints Anxiety;Depression  Eye Contact Fair  Facial Expression Anxious;Sullen;Sad;Worried  Affect Anxious;Depressed;Sad;Sullen  Theatre stage manager  Appearance/Hygiene Disheveled  Behavior Characteristics Cooperative  Mood Anxious;Depressed  Thought Pension scheme manager thinking  Content Blaming self  Delusions None reported or observed  Perception WDL  Hallucination None reported or observed  Judgment Poor  Confusion None  Danger to Self  Current suicidal ideation? Denies  Self-Injurious Behavior No self-injurious ideation or behavior indicators observed or expressed   Agreement Not to Harm Self Yes  Danger to Others  Danger to Others None reported or observed

## 2020-05-18 DIAGNOSIS — F1094 Alcohol use, unspecified with alcohol-induced mood disorder: Secondary | ICD-10-CM

## 2020-05-18 DIAGNOSIS — Z87898 Personal history of other specified conditions: Secondary | ICD-10-CM

## 2020-05-18 DIAGNOSIS — F32A Depression, unspecified: Principal | ICD-10-CM

## 2020-05-18 MED ORDER — TRAMADOL HCL 50 MG PO TABS: 50.0000 mg | ORAL_TABLET | Freq: Two times a day (BID) | ORAL | Status: DC | PRN

## 2020-05-18 NOTE — BHH Counselor (Signed)
Adult Comprehensive Assessment  Patient ID: Brandon Miller, male   DOB: 07-12-1965, 55 y.o.   MRN: [id number]    Information Source: Information source: Patient  Current Stressors:  Patient states their primary concerns and needs for treatment are:: "drinking too much beer." Patient states their goals for this hospitilization and ongoing recovery are:: "Detox. To get through withdrawal and go to rehab." Educational / Learning stressors: No stressors Employment / Job issues: Currently unemployed. Family Relationships: No stressors. Financial / Lack of resources (include bankruptcy):No income, overwhelmed with bills. Housing / Lack of housing: Lives alone Physical health (include injuries & life threatening diseases): Back pain, had a prior knee replacement Social relationships: Lives alone, lonely Substance abuse: Hx of alcohol abuse, has started drinking heavier lately. Progressed from drinking everynight to drinkign all day every day. Hx of pain pill abuse. Bereavement / Loss: Mother and two brothers passed away in the early 2000's.  Living/Environment/Situation:  Living Arrangements: Alone Living conditions (as described by patient or guardian): Single family home Who else lives in the home?: Self How long has patient lived in current situation?: Entire life, family home, 50 years  What is atmosphere in current home: Comfortable  Family History:  Marital status: Single Are you sexually active?: Yes What is your sexual orientation?: Straight Does patient have children?: No  Childhood History:  By whom was/is the patient raised?: Both parents Description of patient's relationship with caregiver when they were a child: "They were craxy but they were loving parents." Patient's description of current relationship with people who raised him/her: Both deceased How were you disciplined when you got in trouble as a child/adolescent?: Appropriate Does patient have siblings?:  Yes Number of Siblings: 4 Description of patient's current relationship with siblings: 2 older brothers, deceased. 1 living brother, 1 living sister. Good relationships. Did patient suffer any verbal/emotional/physical/sexual abuse as a child?: No Did patient suffer from severe childhood neglect?: No Has patient ever been sexually abused/assaulted/raped as an adolescent or adult?: No Was the patient ever a victim of a crime or a disaster?: No Witnessed domestic violence?: No Has patient been effected by domestic violence as an adult?: No  Education:  Highest grade of school patient has completedPsychiatrist Currently a student?: No Learning disability?: Yes What learning problems does patient have?: ADHD  Employment/Work Situation:   Employment situation: Unemployed Patient's job has been impacted by current illness: Yes What is the longest time patient has a held a job?: 28 years Where was the patient employed at that time?: [employer] Did You Receive Any Psychiatric Treatment/Services While in the [employer]?: No  Financial Resources:   Financial resources: No employment   Alcohol/Substance Abuse:   What has been your use of drugs/alcohol within the last 12 months?: Heavy ETOH abuse; 1 case (24 pack) of beet daily Alcohol/Substance Abuse Treatment Hx: Past Tx, Inpatient, Past detox If yes, describe treatment: Detox about 2 years ago when he lost his job. Prior treatment for pain pill addiction (after knee surgery) in [place]. Has alcohol/substance abuse ever caused legal problems?: No  Social Support System:   Patient's Community Support System: Good Describe Community Support System:Sister Type of faith/religion: Believes in God How does patient's faith help to cope with current illness?: yes  Leisure/Recreation:   Leisure and Hobbies: Play golf, hunting, fishing, outdoor stuff- not so much lately  Strengths/Needs:   What is the patient's perception  of their strengths?: Fishing Patient states these barriers may affect/interfere with their treatment:  none Patient states these barriers may affect their return to the community: none  Discharge Plan:   Currently receiving community mental health services: No Patient states concerns and preferences for aftercare planning [id number] in rehab Patient states they will know when they are safe and ready for discharge when: "When I am detoxed and have a bed at rehab." Does patient have access to transportation?: Yes Does patient have financial barriers related to discharge medications?: Yes Patient description of barriers related to discharge medications: no income or insurance Will patient be returning to same living situation after discharge?: Yes, unless he gets into rehab  Summary/Recommendations:   Summary and Recommendations (to be completed by the evaluator): Brandon Miller is a 55 year old male who presents voluntary due to SI due to intoxication. Pt is currently unemployed. Pt states that he currently drinks 24 beers daily. Pt is interested in residential substance use treatment. While here, Brandon Miller can benefit from crisis stabilization, medication management, therapeutic milieu, and referrals for services. Brandon Miller. 05/18/2020

## 2020-05-18 NOTE — BHH Group Notes (Signed)
Pt did not attend wrap up group this evening. Pt was resting in bed.   

## 2020-05-18 NOTE — Progress Notes (Signed)
   05/18/20 0500  Sleep  Number of Hours 6.25

## 2020-05-18 NOTE — Tx Team (Signed)
Interdisciplinary Treatment and Diagnostic Plan Update  05/18/2020 Time of Session: 9:00am  Brandon Miller MRN: [id number]  Principal Diagnosis: Depression, unspecified  Secondary Diagnoses: Principal Problem:   Depression, unspecified Active Problems:   Diabetes mellitus, type 2 (HCC)   Elevated LFTs   Alcohol use disorder, severe, dependence (HCC)   GERD (gastroesophageal reflux disease)   Substance induced mood disorder ([place])   History of seizures   Current Medications:  Current Facility-Administered Medications  Medication Dose Route Frequency Provider Last Rate Last Admin  . alum & mag hydroxide-simeth (MAALOX/MYLANTA) 200-200-20 MG/5ML suspension 30 mL  30 mL Oral Q4H PRN Sharma Covert, MD      . feeding supplement (ENSURE ENLIVE / ENSURE PLUS) liquid 237 mL  237 mL Oral BID BM Sharma Covert, MD   237 mL at 05/18/20 1206  . FLUoxetine (PROZAC) capsule 20 mg  20 mg Oral Daily Sharma Covert, MD   20 mg at 05/18/20 0754  . folic acid (FOLVITE) tablet 1 mg  1 mg Oral Daily Sharma Covert, MD   1 mg at 05/18/20 0754  . gabapentin (NEURONTIN) capsule 300 mg  300 mg Oral TID Sharma Covert, MD   300 mg at 05/18/20 1206  . hydrOXYzine (ATARAX/VISTARIL) tablet 25 mg  25 mg Oral Q6H PRN Sharma Covert, MD   25 mg at 05/17/20 2203  . levETIRAcetam (KEPPRA) tablet 500 mg  500 mg Oral BID Sharma Covert, MD   500 mg at 05/18/20 0754  . loperamide (IMODIUM) capsule 2-4 mg  2-4 mg Oral PRN Sharma Covert, MD      . LORazepam (ATIVAN) tablet 1 mg  1 mg Oral Q6H PRN Sharma Covert, MD      . magnesium hydroxide (MILK OF MAGNESIA) suspension 30 mL  30 mL Oral Daily PRN Sharma Covert, MD      . multivitamin with minerals tablet 1 tablet  1 tablet Oral Daily Sharma Covert, MD   1 tablet at 05/18/20 0754  . ondansetron (ZOFRAN-ODT) disintegrating tablet 4 mg  4 mg Oral Q6H PRN Sharma Covert, MD      . pantoprazole (PROTONIX) EC tablet 40 mg   40 mg Oral Daily Sharma Covert, MD   40 mg at 05/18/20 0754  . thiamine tablet 100 mg  100 mg Oral Daily Sharma Covert, MD   100 mg at 05/18/20 0754  . traMADol (ULTRAM) tablet 50 mg  50 mg Oral Q12H PRN Viann Fish E, MD      . traZODone (DESYREL) tablet 50 mg  50 mg Oral QHS PRN Sharma Covert, MD   50 mg at 05/17/20 2203   PTA Medications: Medications Prior to Admission  Medication Sig Dispense Refill Last Dose  . clonazePAM (KLONOPIN) 1 MG tablet Take 1 mg by mouth 2 (two) times daily.     Marland Kitchen FLUoxetine (PROZAC) 20 MG capsule Take 1 capsule by mouth daily.     Marland Kitchen gabapentin (NEURONTIN) 300 MG capsule Take 1 capsule (300 mg total) by mouth 3 (three) times daily.     Marland Kitchen LORazepam (ATIVAN) 1 MG tablet Take 1 tablet (1 mg total) by mouth every 6 (six) hours as needed (CIWA > 10). 1 tablet 0   . Multiple Vitamin (MULTIVITAMIN WITH MINERALS) TABS tablet Take 1 tablet by mouth daily.     Marland Kitchen thiamine 100 MG tablet Take 1 tablet (100 mg total) by mouth daily.  Patient Stressors: Financial difficulties Health problems Substance abuse  Patient Strengths: Ability for insight Capable of independent living Communication skills Supportive family/friends Work skills  Treatment Modalities: Medication Management, Group therapy, Case management,  1 to 1 session with clinician, Psychoeducation, Recreational therapy.   Physician Treatment Plan for Primary Diagnosis: Depression, unspecified Long Term Goal(s): Improvement in symptoms so as ready for discharge Improvement in symptoms so as ready for discharge   Short Term Goals: Ability to identify changes in lifestyle to reduce recurrence of condition will improve Ability to identify and develop effective coping behaviors will improve Ability to identify triggers associated with substance abuse/mental health issues will improve  Medication Management: Evaluate patient's response, side effects, and tolerance of medication  regimen.  Therapeutic Interventions: 1 to 1 sessions, Unit Group sessions and Medication administration.  Evaluation of Outcomes: Not Met  Physician Treatment Plan for Secondary Diagnosis: Principal Problem:   Depression, unspecified Active Problems:   Diabetes mellitus, type 2 (HCC)   Elevated LFTs   Alcohol use disorder, severe, dependence (HCC)   GERD (gastroesophageal reflux disease)   Substance induced mood disorder (HCC)   History of seizures  Long Term Goal(s): Improvement in symptoms so as ready for discharge Improvement in symptoms so as ready for discharge   Short Term Goals: Ability to identify changes in lifestyle to reduce recurrence of condition will improve Ability to identify and develop effective coping behaviors will improve Ability to identify triggers associated with substance abuse/mental health issues will improve     Medication Management: Evaluate patient's response, side effects, and tolerance of medication regimen.  Therapeutic Interventions: 1 to 1 sessions, Unit Group sessions and Medication administration.  Evaluation of Outcomes: Not Met   RN Treatment Plan for Primary Diagnosis: Depression, unspecified Long Term Goal(s): Knowledge of disease and therapeutic regimen to maintain health will improve  Short Term Goals: Ability to remain free from injury will improve, Ability to participate in decision making will improve, Ability to verbalize feelings will improve, Ability to disclose and discuss suicidal ideas and Ability to identify and develop effective coping behaviors will improve  Medication Management: RN will administer medications as ordered by provider, will assess and evaluate patient's response and provide education to patient for prescribed medication. RN will report any adverse and/or side effects to prescribing provider.  Therapeutic Interventions: 1 on 1 counseling sessions, Psychoeducation, Medication administration, Evaluate responses  to treatment, Monitor vital signs and CBGs as ordered, Perform/monitor CIWA, COWS, AIMS and Fall Risk screenings as ordered, Perform wound care treatments as ordered.  Evaluation of Outcomes: Not Met   LCSW Treatment Plan for Primary Diagnosis: Depression, unspecified Long Term Goal(s): Safe transition to appropriate next level of care at discharge, Engage patient in therapeutic group addressing interpersonal concerns.  Short Term Goals: Engage patient in aftercare planning with referrals and resources, Increase social support, Increase emotional regulation, Facilitate acceptance of mental health diagnosis and concerns, Identify triggers associated with mental health/substance abuse issues and Increase skills for wellness and recovery  Therapeutic Interventions: Assess for all discharge needs, 1 to 1 time with Social worker, Explore available resources and support systems, Assess for adequacy in community support network, Educate family and significant other(s) on suicide prevention, Complete Psychosocial Assessment, Interpersonal group therapy.  Evaluation of Outcomes: Not Met   Progress in Treatment: Attending groups: No. Participating in groups: No. Taking medication as prescribed: Yes. Toleration medication: Yes. Family/Significant other contact made: Yes, individual(s) contacted:  Sister Patient understands diagnosis: No. Discussing patient identified problems/goals with  staff: Yes. Medical problems stabilized or resolved: Yes. Denies suicidal/homicidal ideation: Yes. Issues/concerns per patient self-inventory: No.   New problem(s) identified: No, Describe:  None  New Short Term/Long Term Goal(s): medication stabilization, elimination of SI thoughts, development of comprehensive mental wellness plan.   Patient Goals: "To quit drinking"  Discharge Plan or Barriers: Patient recently admitted. CSW will continue to follow and assess for appropriate referrals and possible discharge  planning.   Reason for Continuation of Hospitalization: Depression Medication stabilization Suicidal ideation  Estimated Length of Stay: 3 to 5 days   Attendees: Patient: Brandon Miller 05/18/2020   Physician: Viann Fish, MD 05/18/2020   Nursing:  05/18/2020   RN Care Manager: 05/18/2020   Social Worker: Verdis Frederickson, [place] 05/18/2020   Recreational Therapist:  05/18/2020   Other:  05/18/2020   Other:  05/18/2020   Other: 05/18/2020     Scribe for Treatment Team: Darleen Crocker, LCSWA 05/18/2020 1:24 PM

## 2020-05-18 NOTE — [facility] Group Notes (Signed)
LCSW Group Therapy Note   05/18/2020 1:00pm   Type of Therapy and Topic:  Group Therapy:  Positive Affirmations   Participation Level:  Did Not Attend  Description of Group: This group addressed positive affirmation toward self and others. Patients went around the room and identified two positive things about themselves and two positive things about a peer in the room. Patients reflected on how it felt to share something positive with others, to identify positive things about themselves, and to hear positive things from others. Patients were encouraged to have a daily reflection of positive characteristics or circumstances.  Therapeutic Goals 1. Patient will verbalize two of their positive qualities 2. Patient will demonstrate empathy for others by stating two positive qualities about a peer in the group 3. Patient will verbalize their feelings when voicing positive self affirmations and when voicing positive affirmations of others 4. Patients will discuss the potential positive impact on their wellness/recovery of focusing on positive traits of self and others. Summary of Patient Progress: Pt did not attend group despite personal invitation.     Therapeutic Modalities Cognitive Behavioral Therapy Motivational Interviewing  Brandon Miller 05/18/2020 2:17 PM

## 2020-05-18 NOTE — Plan of Care (Signed)
  Problem: Education: Goal: Ability to state activities that reduce stress will improve Outcome: Progressing   Problem: Coping: Goal: Ability to identify and develop effective coping behavior will improve Outcome: Progressing   Problem: Self-Concept: Goal: Ability to identify factors that promote anxiety will improve Outcome: Progressing

## 2020-05-18 NOTE — Progress Notes (Addendum)
[facility] MD Progress Note  05/18/2020 10:11 AM Brandon Miller  MRN:  [id number]   Chief Complaint: suicidal ideation, depression, and alcohol abuse  Subjective: Brandon Miller is a 55 y.o. male with a history of alcohol use d/o, alcohol-induced mood d/o, ADHD, h/o seizures, and TBI s/p subdural hematoma, who was initially admitted for inpatient psychiatric hospitalization on 05/17/2020 for management of SI, worsening depression, and alcohol abuse. The patient is currently on [facility] Day 1.   Chart Review from last 24 hours:  The patient's chart was reviewed and nursing notes were reviewed. The patient's case was discussed in multidisciplinary team meeting. Per nursing he was visible some on the unit yesterday but did not attend groups. No behavioral issues or agitation noted. Per [facility] he was compliant with scheduled medications and did receive PRN Vistaril X2 yesterday for anxiety. He received Trazodone PRN X1 for sleep. CIWA scores 0,0,2,2.   Information Obtained Today During Patient Interview: The patient was seen and evaluated on the unit. On assessment today the patient reports that he is feeling irritable today and has some mild shakes he associates with withdrawal. He voices no other physical complaints and denies cravings for alcohol. He clarifies that he was drinking over a case of beer daily prior to admission but does not state how long he had been drinking this heavily. He thinks his last drink was 3-4 days ago and he admits to 1 previous presumed alcohol withdrawal seizure 3 years ago. He does not think he has had DTs in the past. He states that his PCP was prescribing him Klonopin 1mg  bid which he was also taking along with alcohol prior to admission as well as Adderall 20mg  bid for ADHD. I discussed his current medication regimen including the fact that he was started prophylactically on Keppra 500mg  bid given his previous TBI/subdural hematoma and his reported seizure history. I  discussed that he is also on Neurontin to help with cravings and withdrawal and that this is also a seizure medication. He was advised that he should not suddenly stop these antiepileptics after discharge without risk of rebound seizures. He states he is tolerating the medications without issues. He understands he is also on Prozac to help with mood and anxiety and we discussed that many of his residual mood issues are likely related to his substance use. He is interested in a residential rehab program after discharge. He states he attended a residential program in [place] in 2017 for opiate addiction in the past. He denies IV drug use or other illicit/prescription pill abuse recently. He denies SI, HI, AVH, or paranoia.   Principal Problem: Depression, unspecified Diagnosis: Principal Problem:   Depression, unspecified Active Problems:   Diabetes mellitus, type 2 (HCC)   Elevated LFTs   Alcohol use disorder, severe, dependence (HCC)   GERD (gastroesophageal reflux disease)   Substance induced mood disorder ([place])   History of seizures  Total Time Spent in Direct Patient Care:  I personally spent 35 minutes on the unit in direct patient care. The direct patient care time included face-to-face time with the patient, reviewing the patient's chart, communicating with other professionals, and coordinating care. Greater than 50% of this time was spent in counseling or coordinating care with the patient regarding goals of hospitalization, psycho-education, and discharge planning needs.  Past Psychiatric History: see admission H&P  Past Medical History:  Past Medical History:  Diagnosis Date  . Allergy   . Anemia   . Anxiety   .  Arthritis    RA  . Complication of anesthesia    hard to wake up-was told he may have sleep apnea-never tested  . Depression   . Diabetes mellitus without complication (HCC)    with weight no more problems  . Seizure (HCC)    had one seizure of unknown etiology a few  tears ago, none since and on no meds for them  . Sleep apnea   . Snores   h/o subdural hematoma 12/2019  Past Surgical History:  Procedure Laterality Date  . BIOPSY  02/12/2019   Procedure: BIOPSY;  Surgeon: Corbin Ade, MD;  [facility];  Service: Endoscopy;;  stomach  . COLONOSCOPY WITH PROPOFOL N/A 02/12/2019   Procedure: COLONOSCOPY WITH PROPOFOL;  Surgeon: Corbin Ade, MD;  [facility];  Service: Endoscopy;  Laterality: N/A;  10:30am  . ELBOW SURGERY Right 2010   Dr. Eulah Pont  . ESOPHAGOGASTRODUODENOSCOPY (EGD) WITH PROPOFOL N/A 02/12/2019   Procedure: ESOPHAGOGASTRODUODENOSCOPY (EGD) WITH PROPOFOL;  Surgeon: Corbin Ade, MD;  [facility];  Service: Endoscopy;  Laterality: N/A;  . HERNIA REPAIR     Right inguinal  . KNEE ARTHROSCOPY WITH MEDIAL MENISECTOMY Right 06/11/2013   Procedure: RIGHT KNEE ARTHROSCOPY WITH MEDIAL MENISECTOMY AND CHONDROPLASTY;  Surgeon: Loreta Ave, MD;  Location: [facility];  Service: Orthopedics;  Laterality: Right;  Chondroplasty, loose bodies, medial menisectomy, lysis of adhesions.  Marland Kitchen KNEE SURGERY Right 2011  . POLYPECTOMY  02/12/2019   Procedure: POLYPECTOMY;  Surgeon: Corbin Ade, MD;  [facility];  Service: Endoscopy;;  . right ankle    . SHOULDER SURGERY Right 2010   Dr. Eulah Pont  . TOTAL KNEE ARTHROPLASTY Right 08/25/2014   Procedure: TOTAL KNEE ARTHROPLASTY;  Surgeon: Loreta Ave, MD;  [facility];  Service: Orthopedics;  Laterality: Right;   Family History:  Family History  Problem Relation Age of Onset  . Hepatitis Mother   . Cirrhosis Mother        Did not drink  . Pneumonia Father   . Kidney failure Father   . Gastric cancer Brother        ? Gastric CA spread to esophagus  . Colon cancer Neg Hx    Family Psychiatric  History: see admission H&P  Social History:  Social History   Substance and Sexual Activity  Alcohol Use Yes   Comment: 10-20/day  cans beer     Social History   Substance and Sexual Activity  Drug Use No    Social History   Socioeconomic History  . Marital status: Single    Spouse name: Not on file  . Number of children: Not on file  . Years of education: Not on file  . Highest education level: Not on file  Occupational History  . Occupation: unemployed  Tobacco Use  . Smoking status: Current Every Day Smoker    Packs/day: 1.50    Years: 25.00    Pack years: 37.50    Types: Cigarettes  . Smokeless tobacco: Never Used  Vaping Use  . Vaping Use: Never used  Substance and Sexual Activity  . Alcohol use: Yes    Comment: 10-20/day cans beer  . Drug use: No  . Sexual activity: Not Currently  Other Topics Concern  . Not on file  Social History Narrative  . Not on file   Social Determinants of Health   Financial Resource Strain: Not on file  Food Insecurity:  Not on file  Transportation Needs: Not on file  Physical Activity: Not on file  Stress: Not on file  Social Connections: Not on file   Sleep: Good  Appetite:  Fair  Current Medications: Current Facility-Administered Medications  Medication Dose Route Frequency Provider Last Rate Last Admin  . alum & mag hydroxide-simeth (MAALOX/MYLANTA) 200-200-20 MG/5ML suspension 30 mL  30 mL Oral Q4H PRN Sharma Covert, MD      . feeding supplement (ENSURE ENLIVE / ENSURE PLUS) liquid 237 mL  237 mL Oral BID BM Sharma Covert, MD   237 mL at 05/17/20 1616  . FLUoxetine (PROZAC) capsule 20 mg  20 mg Oral Daily Sharma Covert, MD   20 mg at 05/18/20 0754  . folic acid (FOLVITE) tablet 1 mg  1 mg Oral Daily Sharma Covert, MD   1 mg at 05/18/20 0754  . gabapentin (NEURONTIN) capsule 300 mg  300 mg Oral TID Sharma Covert, MD   300 mg at 05/18/20 0754  . hydrOXYzine (ATARAX/VISTARIL) tablet 25 mg  25 mg Oral Q6H PRN Sharma Covert, MD   25 mg at 05/17/20 2203  . levETIRAcetam (KEPPRA) tablet 500 mg  500 mg Oral BID Sharma Covert,  MD   500 mg at 05/18/20 0754  . loperamide (IMODIUM) capsule 2-4 mg  2-4 mg Oral PRN Sharma Covert, MD      . LORazepam (ATIVAN) tablet 1 mg  1 mg Oral Q6H PRN Sharma Covert, MD      . magnesium hydroxide (MILK OF MAGNESIA) suspension 30 mL  30 mL Oral Daily PRN Sharma Covert, MD      . multivitamin with minerals tablet 1 tablet  1 tablet Oral Daily Sharma Covert, MD   1 tablet at 05/18/20 0754  . ondansetron (ZOFRAN-ODT) disintegrating tablet 4 mg  4 mg Oral Q6H PRN Sharma Covert, MD      . pantoprazole (PROTONIX) EC tablet 40 mg  40 mg Oral Daily Sharma Covert, MD   40 mg at 05/18/20 0754  . thiamine (B-1) injection 100 mg  100 mg Intramuscular Once Sharma Covert, MD      . thiamine tablet 100 mg  100 mg Oral Daily Sharma Covert, MD   100 mg at 05/18/20 0754  . traMADol (ULTRAM) tablet 50 mg  50 mg Oral Q6H PRN Sharma Covert, MD      . traZODone (DESYREL) tablet 50 mg  50 mg Oral QHS PRN Sharma Covert, MD   50 mg at 05/17/20 2203    Lab Results:  Results for orders placed or performed during the Miller encounter of 05/16/20 (from the past 48 hour(s))  Resp Panel by RT-PCR (Flu A&B, Covid) Nasopharyngeal Swab     Status: None   Collection Time: 05/17/20  1:15 AM   Specimen: Nasopharyngeal Swab; Nasopharyngeal(NP) swabs in vial transport medium  Result Value Ref Range   SARS Coronavirus 2 by RT PCR NEGATIVE NEGATIVE    Comment: (NOTE) SARS-CoV-2 target nucleic acids are NOT DETECTED.  The SARS-CoV-2 RNA is generally detectable in upper respiratory specimens during the acute phase of infection. The lowest concentration of SARS-CoV-2 viral copies this assay can detect is 138 copies/mL. A negative result does not preclude SARS-Cov-2 infection and should not be used as the sole basis for treatment or other patient management decisions. A negative result may occur with  improper specimen collection/handling, submission of specimen  other  than nasopharyngeal swab, presence of viral mutation(s) within the areas targeted by this assay, and inadequate number of viral copies(<138 copies/mL). A negative result must be combined with clinical observations, patient history, and epidemiological information. The expected result is Negative.  Fact Sheet for Patients:  EntrepreneurPulse.com.au  Fact Sheet for Healthcare Providers:  IncredibleEmployment.be  This test is no t yet approved or cleared by the [place] FDA and  has been authorized for detection and/or diagnosis of SARS-CoV-2 by FDA under an Emergency Use Authorization (EUA). This EUA will remain  in effect (meaning this test can be used) for the duration of the COVID-19 declaration under Section 564(b)(1) of the Act, 21 U.S.C.section 360bbb-3(b)(1), unless the authorization is terminated  or revoked sooner.       Influenza A by PCR NEGATIVE NEGATIVE   Influenza B by PCR NEGATIVE NEGATIVE    Comment: (NOTE) The Xpert Xpress SARS-CoV-2/FLU/RSV plus assay is intended as an aid in the diagnosis of influenza from Nasopharyngeal swab specimens and should not be used as a sole basis for treatment. Nasal washings and aspirates are unacceptable for Xpert Xpress SARS-CoV-2/FLU/RSV testing.  Fact Sheet for Patients: EntrepreneurPulse.com.au  Fact Sheet for Healthcare Providers: IncredibleEmployment.be  This test is not yet approved or cleared by the [place] FDA and has been authorized for detection and/or diagnosis of SARS-CoV-2 by FDA under an Emergency Use Authorization (EUA). This EUA will remain in effect (meaning this test can be used) for the duration of the COVID-19 declaration under Section 564(b)(1) of the Act, 21 U.S.C. section 360bbb-3(b)(1), unless the authorization is terminated or revoked.  Performed at [facility], [place] [address] [place], [place] [address]    POCT Urine Drug Screen - (ICup)     Status: Abnormal   Collection Time: 05/17/20  1:22 AM  Result Value Ref Range   POC Amphetamine UR Positive (A) NONE DETECTED (Cut Off Level 1000 ng/mL)   POC Secobarbital (BAR) None Detected NONE DETECTED (Cut Off Level 300 ng/mL)   POC Buprenorphine (BUP) None Detected NONE DETECTED (Cut Off Level 10 ng/mL)   POC Oxazepam (BZO) None Detected NONE DETECTED (Cut Off Level 300 ng/mL)   POC Cocaine UR None Detected NONE DETECTED (Cut Off Level 300 ng/mL)   POC Methamphetamine UR None Detected NONE DETECTED (Cut Off Level 1000 ng/mL)   POC Morphine None Detected NONE DETECTED (Cut Off Level 300 ng/mL)   POC Oxycodone UR None Detected NONE DETECTED (Cut Off Level 100 ng/mL)   POC Methadone UR None Detected NONE DETECTED (Cut Off Level 300 ng/mL)   POC Marijuana UR None Detected NONE DETECTED (Cut Off Level 50 ng/mL)  CBC with Differential/Platelet     Status: Abnormal   Collection Time: 05/17/20  1:25 AM  Result Value Ref Range   WBC 5.9 4.0 - 10.5 K/uL   RBC 5.02 4.22 - 5.81 MIL/uL   Hemoglobin 16.3 13.0 - 17.0 g/dL   HCT 46.7 39.0 - 52.0 %   MCV 93.0 80.0 - 100.0 fL   MCH 32.5 26.0 - 34.0 pg   MCHC 34.9 30.0 - 36.0 g/dL   RDW 12.3 11.5 - 15.5 %   Platelets 126 (L) 150 - 400 K/uL   nRBC 0.0 0.0 - 0.2 %   Neutrophils Relative % 56 %   Neutro Abs 3.3 1.7 - 7.7 K/uL   Lymphocytes Relative 36 %   Lymphs Abs 2.1 0.7 - 4.0 K/uL   Monocytes Relative 6 %   Monocytes  Absolute 0.4 0.1 - 1.0 K/uL   Eosinophils Relative 1 %   Eosinophils Absolute 0.1 0.0 - 0.5 K/uL   Basophils Relative 1 %   Basophils Absolute 0.1 0.0 - 0.1 K/uL   Immature Granulocytes 0 %   Abs Immature Granulocytes 0.01 0.00 - 0.07 K/uL    Comment: Performed at [facility] [address], [place], [place] [address]  Comprehensive metabolic panel     Status: Abnormal   Collection Time: 05/17/20  1:25 AM  Result Value Ref Range   Sodium 135 135 - 145 mmol/L   Potassium 3.0 (L)  3.5 - 5.1 mmol/L   Chloride 95 (L) 98 - 111 mmol/L   CO2 26 22 - 32 mmol/L   Glucose, Bld 122 (H) 70 - 99 mg/dL    Comment: Glucose reference range applies only to samples taken after fasting for at least 8 hours.   BUN 8 6 - 20 mg/dL   Creatinine, Ser 0.78 0.61 - 1.24 mg/dL   Calcium 8.9 8.9 - 10.3 mg/dL   Total Protein 7.6 6.5 - 8.1 g/dL   Albumin 4.1 3.5 - 5.0 g/dL   AST 141 (H) 15 - 41 U/L   ALT 65 (H) 0 - 44 U/L   Alkaline Phosphatase 107 38 - 126 U/L   Total Bilirubin 1.6 (H) 0.3 - 1.2 mg/dL   GFR, Estimated >60 >60 mL/min    Comment: (NOTE) Calculated using the CKD-EPI Creatinine Equation (2021)    Anion gap 14 5 - 15    Comment: Performed at [facility], [place] [address] [place], [place] [address]  Ethanol     Status: Abnormal   Collection Time: 05/17/20  1:25 AM  Result Value Ref Range   Alcohol, Ethyl (B) 188 (H) <10 mg/dL    Comment: (NOTE) Lowest detectable limit for serum alcohol is 10 mg/dL.  For medical purposes only. Performed at [facility], [place] [address] [place], [place] [address]   POC SARS Coronavirus 2 Ag     Status: None   Collection Time: 05/17/20  1:36 AM  Result Value Ref Range   SARSCOV2ONAVIRUS 2 AG NEGATIVE NEGATIVE    Comment: (NOTE) SARS-CoV-2 antigen NOT DETECTED.   Negative results are presumptive.  Negative results do not preclude SARS-CoV-2 infection and should not be used as the sole basis for treatment or other patient management decisions, including infection  control decisions, particularly in the presence of clinical signs and  symptoms consistent with COVID-19, or in those who have been in contact with the virus.  Negative results must be combined with clinical observations, patient history, and epidemiological information. The expected result is Negative.  Fact Sheet for Patients: HandmadeRecipes.com.cy  Fact Sheet for Healthcare Providers: FuneralLife.at  This  test is not yet approved or cleared by the [place] FDA and  has been authorized for detection and/or diagnosis of SARS-CoV-2 by FDA under an Emergency Use Authorization (EUA).  This EUA will remain in effect (meaning this test can be used) for the duration of  the COV ID-19 declaration under Section 564(b)(1) of the Act, 21 U.S.C. section 360bbb-3(b)(1), unless the authorization is terminated or revoked sooner.      Blood Alcohol level:  Lab Results  Component Value Date   ETH 188 (H) 05/17/2020   ETH 129 (H) [id number]    Metabolic Disorder Labs: Lab Results  Component Value Date   HGBA1C 4.7 11/17/2018   MPG 131 08/25/2014  MPG 154 (H) 10/30/2011   No results found for: PROLACTIN No results found for: CHOL, TRIG, HDL, CHOLHDL, VLDL, LDLCALC  Physical Findings: AIMS: Facial and Oral Movements Muscles of Facial Expression: None, normal Lips and Perioral Area: None, normal Jaw: None, normal Tongue: None, normal,Extremity Movements Upper (arms, wrists, hands, fingers): None, normal Lower (legs, knees, ankles, toes): None, normal, Trunk Movements Neck, shoulders, hips: None, normal, Overall Severity Severity of abnormal movements (highest score from questions above): None, normal Incapacitation due to abnormal movements: None, normal Patient's awareness of abnormal movements (rate only patient's report): No Awareness, Dental Status Current problems with teeth and/or dentures?: No Does patient usually wear dentures?: No  CIWA:  CIWA-Ar Total: 2     Musculoskeletal: Strength & Muscle Tone: within normal limits Gait & Station: unassessed - in bed Patient leans: N/A   Psychiatric Specialty Exam: Physical Exam Vitals reviewed.  HENT:     Head: Normocephalic.  Pulmonary:     Effort: Pulmonary effort is normal.  Neurological:     Mental Status: He is alert.     Review of Systems  Respiratory: Negative for shortness of breath.   Cardiovascular: Negative for  chest pain.  Gastrointestinal: Negative for abdominal pain, diarrhea, nausea and vomiting.  Neurological: Negative for headaches.    Blood pressure 111/76, pulse 60, temperature 97.7 F (36.5 C), temperature source Oral, resp. rate 16, height 6\' 2"  (1.88 m), weight 80.3 kg, SpO2 100 %.Body mass index is 22.73 kg/m.  General Appearance: casually dressed, fair hygiene  Eye Contact:  Good  Speech:  Clear and Coherent and Normal Rate  Volume:  Normal  Mood:  Irritable  Affect:  Congruent and Constricted  Thought Process:  Goal Directed  Orientation:  Oriented to self, year, month and President  Thought Content:  Logical and denies AVH, paranoia, or delusions; no acute psychosis on exam  Suicidal Thoughts:  No  Homicidal Thoughts:  No  Memory:  Recent;   Fair  Judgement:  Fair  Insight:  Fair  Psychomotor Activity:  Normal  Concentration:  Concentration: Good and Attention Span: Good  Recall:  [employer] of Knowledge:  Fair  Language:  Good  Akathisia:  Negative  Assets:  Communication Skills Desire for Improvement Resilience  ADL's:  Intact  Cognition:  WNL  Sleep:  Number of Hours: 6.25   Treatment Plan Summary: Diagnoses / Active Problems: Unspecified depressive d/o (r/o Substance induced depressive d/o) Alcohol use d/o severe GERD by hx Type II DM by hx ADHD by hx  H/o seizures and TBI s/p subdural hematoma Elevated LFTs  PLAN: 1. Safety and Monitoring:  -- Voluntary admission to inpatient psychiatric unit for safety, stabilization and treatment  -- Daily contact with patient to assess and evaluate symptoms and progress in treatment  -- Patient's case to be discussed in multi-disciplinary team meeting  -- Observation Level : q15 minute checks  -- Vital signs:  q12 hours  -- Precautions: suicide  2. Psychiatric Diagnoses and Treatment:   Unspecified depressive d/o (r/o substance induced depressive d/o; r/o depressive d/o related to TBI)  -- Continue Prozac 20mg   daily for depressive symptoms   -- Continue Neurontin 300mg  tid for anxiety and withdrawal  -- Checking TSH  -- Encouraged patient to participate in unit milieu and in scheduled group therapies   -- Short Term Goals: Ability to identify changes in lifestyle to reduce recurrence of condition will improve and Ability to identify and develop effective coping behaviors will improve  --  Long Term Goals: Improvement in symptoms so as ready for discharge   Alcohol use d/o severe  -- UDS positive for amphetamines and ETOH 188 on admission  -- Continue CIWA protocol with Ativan 1mg  for CIWA scores >10 and oral thiamine and folate replacement  -- Continue Neurontin 300mg  tid for withdrawal, cravings, and anxiety  -- Patient interested in residential rehab after discharge  -- Discussed the need to abstain from benzodiazepine and stimulant use after discharge given addictions history and discussed risk of respiratory suppression and death from mixing alcohol and benzodiazepines   -- Short Term Goals: Ability to identify triggers associated with substance abuse/mental health issues will improve  -- Long Term Goals: Improvement in symptoms so as ready for discharge   3. Medical Issues Being Addressed:   GERD   -- Continue Protonix 40mg  daily   Type II DM  -- Not on home medications per records  -- HbgA1c pending   Elevated LFTs (AST 141 and ALT 65) with elevated total bili  -- Hepatic function panel pending with PT/INR pending  -- Holding Tylenol  -- Checking Hepatitis panel and HIV at patient's request   Hypokalemia (K+ 3.0)  -- Repeat BMP pending for tomorrow with Mag+  -- Improved po intake encouraged and received oral replacement 63meq X1   Thrombocytopenia (platelets 126)  -- appears chronically low when compared to previous labs - 86 9 months ago, 107 1 year ago)  -- Will need PCP f/u after discharge   H/o presumed alcohol withdrawal seizures with h/o TBI/subdural hematoma in 2021  --  Continue Keppra 500mg  bid at this time   -- Continue Neurontin 300mg  tid  -- Will need outpatient neurology f/u after discharge for additional management  -- On seizure precautions    Left foot pain  -- Radiographs negative for fracture  -- Has Ultram PRN for severe pain given NSAID allergy  4. Discharge Planning:   -- Social work and case management to assist with discharge planning and identification of [facility] follow-up needs prior to discharge  -- Estimated LOS: 5-7 days  -- Discharge Concerns: Need to establish a safety plan; Medication compliance and effectiveness  -- Discharge Goals: Return home with outpatient referrals for mental health follow-up including medication management/psychotherapy  Harlow Asa, MD, FAPA 05/18/2020, 10:11 AM

## 2020-05-18 NOTE — Progress Notes (Signed)
Progress note    05/18/20 0754  Psych Admission Type (Psych Patients Only)  Admission Status Voluntary  Psychosocial Assessment  Patient Complaints Anxiety;Sadness  Eye Contact Fair  Facial Expression Anxious;Sullen;Sad  Affect Anxious;Sad;Sullen  Speech Logical/coherent  Interaction Assertive  Appearance/Hygiene Improved  Behavior Characteristics Cooperative;Appropriate to situation;Anxious  Mood Anxious;Pleasant  Thought Process  Coherency Concrete thinking  Content Blaming self  Perception WDL  Hallucination None reported or observed  Judgment Poor  Confusion None  Danger to Self  Current suicidal ideation? Denies  Self-Injurious Behavior No self-injurious ideation or behavior indicators observed or expressed   Danger to Others  Danger to Others None reported or observed

## 2020-05-18 NOTE — Progress Notes (Signed)
Pt stated he was feeling uneasy, pt stated he did not feel well most of the day , pt very anxious and agitated this evening.

## 2020-05-18 NOTE — Progress Notes (Signed)
Recreation Therapy Notes  Date:  4.27.22 Time: 0930 Location: [address] Dayroom  Group Topic: Stress Management  Goal Area(s) Addresses:  Patient will identify positive stress management techniques. Patient will identify benefits of using stress management post d/c.  Intervention: Stress Management  Activity: Meditation.  LRT played a meditation that focused on letting go of the past and not letting it hold you back from progressing in life.    Education: Stress Management, Discharge Planning.   Education Outcome: Acknowledges Education  Clinical Observations/Feedback: Pt did not attend group session.    Victorino Sparrow, LRT/CTRS     Ria Comment, Mohab Ashby A 05/18/2020 11:10 AM

## 2020-05-19 LAB — HEPATIC FUNCTION PANEL
ALT: 138 U/L — ABNORMAL HIGH (ref 0–44)
AST: 185 U/L — ABNORMAL HIGH (ref 15–41)
Albumin: 3.9 g/dL (ref 3.5–5.0)
Alkaline Phosphatase: 160 U/L — ABNORMAL HIGH (ref 38–126)
Bilirubin, Direct: 0.4 mg/dL — ABNORMAL HIGH (ref 0.0–0.2)
Indirect Bilirubin: 1.6 mg/dL — ABNORMAL HIGH (ref 0.3–0.9)
Total Bilirubin: 2 mg/dL — ABNORMAL HIGH (ref 0.3–1.2)
Total Protein: 7.3 g/dL (ref 6.5–8.1)

## 2020-05-19 LAB — BASIC METABOLIC PANEL
Anion gap: 11 (ref 5–15)
BUN: 13 mg/dL (ref 6–20)
CO2: 29 mmol/L (ref 22–32)
Calcium: 9.6 mg/dL (ref 8.9–10.3)
Chloride: 99 mmol/L (ref 98–111)
Creatinine, Ser: 0.86 mg/dL (ref 0.61–1.24)
GFR, Estimated: >60 mL/min (ref >60)
Glucose, Bld: 88 mg/dL (ref 70–99)
Potassium: 3.8 mmol/L (ref 3.5–5.1)
Sodium: 139 mmol/L (ref 135–145)

## 2020-05-19 LAB — HEPATITIS PANEL, ACUTE
HCV Ab: NONREACTIVE
Hep A IgM: NONREACTIVE
Hep B C IgM: NONREACTIVE
Hepatitis B Surface Ag: NONREACTIVE

## 2020-05-19 LAB — HEMOGLOBIN A1C
Hgb A1c MFr Bld: 4.6 % — ABNORMAL LOW (ref 4.8–5.6)
Mean Plasma Glucose: 85.32 mg/dL

## 2020-05-19 LAB — MAGNESIUM: Magnesium: 2 mg/dL (ref 1.7–2.4)

## 2020-05-19 LAB — T4, FREE: Free T4: 0.59 ng/dL — ABNORMAL LOW (ref 0.61–1.12)

## 2020-05-19 LAB — HIV ANTIBODY (ROUTINE TESTING W REFLEX): HIV Screen 4th Generation wRfx: NONREACTIVE

## 2020-05-19 LAB — TSH: TSH: 5.479 u[IU]/mL — ABNORMAL HIGH (ref 0.350–4.500)

## 2020-05-19 MED ORDER — TRAZODONE HCL 50 MG PO TABS
50.0000 mg | ORAL_TABLET | Freq: Once | ORAL | Status: AC | PRN
  Administered 2020-05-19: 50 mg via ORAL
  Filled 2020-05-19: qty 1

## 2020-05-19 NOTE — Progress Notes (Addendum)
[facility] MD Progress Note  05/19/2020 6:49 AM Brandon Miller  MRN:  [id number]   Chief Complaint: suicidal ideation, depression, and alcohol abuse  Subjective: Brandon Miller is a 55 y.o. male with a history of alcohol use d/o, alcohol-induced mood d/o, ADHD, h/o seizures, and TBI s/p subdural hematoma, who was initially admitted for inpatient psychiatric hospitalization on 05/17/2020 for management of SI, worsening depression, and alcohol abuse. The patient is currently on Hospital Day 2.   Chart Review from last 24 hours:  The patient's chart was reviewed and nursing notes were reviewed. The patient's case was discussed in multidisciplinary team meeting. Per nursing patient slept 6.75 hours. He was reportedly anxious and somewhat agitated yesterday but no safety concerns were noted. Per MAR he was compliant with scheduled medications and did receive Ativan X1 for CIWA score >10 on nightshift. He received Trazodone X1 for sleep. CIWA scores: 2, 2, 10,7.  Information Obtained Today During Patient Interview: The patient was seen and evaluated on the unit. He states he has been sleeping more since admission and continues to feel irritable which he attributes to withdrawal. He reports mild cravings for alcohol and states he has had some residual chills and fatigue associated with withdrawal. He has not showered since admission and was prompted to attend to his ADLs. He denies AVH, paranoia, SI, or HI. He states his appetite is "okay." He denies medication side-effects. He reports that his left foot is bruised but he is ambulating without difficulty.   Principal Problem: Depression, unspecified Diagnosis: Principal Problem:   Depression, unspecified Active Problems:   Diabetes mellitus, type 2 (HCC)   Elevated LFTs   Alcohol use disorder, severe, dependence (HCC)   GERD (gastroesophageal reflux disease)   Substance induced mood disorder ([place])   History of seizures  Total Time Spent in Direct  Patient Care:  I personally spent 30 minutes on the unit in direct patient care. The direct patient care time included face-to-face time with the patient, reviewing the patient's chart, communicating with other professionals, and coordinating care. Greater than 50% of this time was spent in counseling or coordinating care with the patient regarding goals of hospitalization, psycho-education, and discharge planning needs.  Past Psychiatric History: see admission H&P  Past Medical History:  Past Medical History:  Diagnosis Date  . Allergy   . Anemia   . Anxiety   . Arthritis    RA  . Complication of anesthesia    hard to wake up-was told he may have sleep apnea-never tested  . Depression   . Diabetes mellitus without complication ([place])    with weight no more problems  . Seizure ([place])    had one seizure of unknown etiology a few tears ago, none since and on no meds for them  . Sleep apnea   . Snores   h/o subdural hematoma 12/2019  Past Surgical History:  Procedure Laterality Date  . BIOPSY  02/12/2019   Procedure: BIOPSY;  Surgeon: Daneil Dolin, MD;  [facility];  Service: Endoscopy;;  stomach  . COLONOSCOPY WITH PROPOFOL N/A 02/12/2019   Procedure: COLONOSCOPY WITH PROPOFOL;  Surgeon: Daneil Dolin, MD;  [facility];  Service: Endoscopy;  Laterality: N/A;  10:30am  . ELBOW SURGERY Right 2010   Dr. Percell Miller  . ESOPHAGOGASTRODUODENOSCOPY (EGD) WITH PROPOFOL N/A 02/12/2019   Procedure: ESOPHAGOGASTRODUODENOSCOPY (EGD) WITH PROPOFOL;  Surgeon: Daneil Dolin, MD;  [facility];  Service: Endoscopy;  Laterality: N/A;  .  HERNIA REPAIR     Right inguinal  . KNEE ARTHROSCOPY WITH MEDIAL MENISECTOMY Right 06/11/2013   Procedure: RIGHT KNEE ARTHROSCOPY WITH MEDIAL MENISECTOMY AND CHONDROPLASTY;  Surgeon: Ninetta Lights, MD;  Location: [place];  Service: Orthopedics;  Laterality: Right;  Chondroplasty, loose bodies, medial menisectomy,  lysis of adhesions.  Marland Kitchen KNEE SURGERY Right 2011  . POLYPECTOMY  02/12/2019   Procedure: POLYPECTOMY;  Surgeon: Daneil Dolin, MD;  [facility];  Service: Endoscopy;;  . right ankle    . SHOULDER SURGERY Right 2010   Dr. Percell Miller  . TOTAL KNEE ARTHROPLASTY Right 08/25/2014   Procedure: TOTAL KNEE ARTHROPLASTY;  Surgeon: Ninetta Lights, MD;  Location: [place];  Service: Orthopedics;  Laterality: Right;   Family History:  Family History  Problem Relation Age of Onset  . Hepatitis Mother   . Cirrhosis Mother        Did not drink  . Pneumonia Father   . Kidney failure Father   . Gastric cancer Brother        ? Gastric CA spread to esophagus  . Colon cancer Neg Hx    Family Psychiatric  History: see admission H&P  Social History:  Social History   Substance and Sexual Activity  Alcohol Use Yes   Comment: 10-20/day cans beer     Social History   Substance and Sexual Activity  Drug Use No    Social History   Socioeconomic History  . Marital status: Single    Spouse name: Not on file  . Number of children: Not on file  . Years of education: Not on file  . Highest education level: Not on file  Occupational History  . Occupation: unemployed  Tobacco Use  . Smoking status: Current Every Day Smoker    Packs/day: 1.50    Years: 25.00    Pack years: 37.50    Types: Cigarettes  . Smokeless tobacco: Never Used  Vaping Use  . Vaping Use: Never used  Substance and Sexual Activity  . Alcohol use: Yes    Comment: 10-20/day cans beer  . Drug use: No  . Sexual activity: Not Currently  Other Topics Concern  . Not on file  Social History Narrative  . Not on file   Social Determinants of Health   Financial Resource Strain: Not on file  Food Insecurity: Not on file  Transportation Needs: Not on file  Physical Activity: Not on file  Stress: Not on file  Social Connections: Not on file   Sleep: Good  Appetite:  Fair  Current Medications: Current  Facility-Administered Medications  Medication Dose Route Frequency Provider Last Rate Last Admin  . alum & mag hydroxide-simeth (MAALOX/MYLANTA) 200-200-20 MG/5ML suspension 30 mL  30 mL Oral Q4H PRN Sharma Covert, MD      . feeding supplement (ENSURE ENLIVE / ENSURE PLUS) liquid 237 mL  237 mL Oral BID BM Sharma Covert, MD   237 mL at 05/18/20 1646  . FLUoxetine (PROZAC) capsule 20 mg  20 mg Oral Daily Sharma Covert, MD   20 mg at 05/18/20 0754  . folic acid (FOLVITE) tablet 1 mg  1 mg Oral Daily Sharma Covert, MD   1 mg at 05/18/20 0754  . gabapentin (NEURONTIN) capsule 300 mg  300 mg Oral TID Sharma Covert, MD   300 mg at 05/18/20 1646  . hydrOXYzine (ATARAX/VISTARIL) tablet 25 mg  25 mg Oral Q6H PRN Sharma Covert,  MD   25 mg at 05/17/20 2203  . levETIRAcetam (KEPPRA) tablet 500 mg  500 mg Oral BID Sharma Covert, MD   500 mg at 05/18/20 1646  . loperamide (IMODIUM) capsule 2-4 mg  2-4 mg Oral PRN Sharma Covert, MD      . LORazepam (ATIVAN) tablet 1 mg  1 mg Oral Q6H PRN Sharma Covert, MD   1 mg at 05/18/20 2227  . magnesium hydroxide (MILK OF MAGNESIA) suspension 30 mL  30 mL Oral Daily PRN Sharma Covert, MD      . multivitamin with minerals tablet 1 tablet  1 tablet Oral Daily Sharma Covert, MD   1 tablet at 05/18/20 0754  . ondansetron (ZOFRAN-ODT) disintegrating tablet 4 mg  4 mg Oral Q6H PRN Sharma Covert, MD      . pantoprazole (PROTONIX) EC tablet 40 mg  40 mg Oral Daily Sharma Covert, MD   40 mg at 05/18/20 0754  . thiamine tablet 100 mg  100 mg Oral Daily Sharma Covert, MD   100 mg at 05/18/20 0754  . traMADol (ULTRAM) tablet 50 mg  50 mg Oral Q12H PRN Nelda Marseille, Jamilla Galli E, MD      . traZODone (DESYREL) tablet 50 mg  50 mg Oral QHS PRN Sharma Covert, MD   50 mg at 05/18/20 2227    Lab Results:  No results found for this or any previous visit (from the past 48 hour(s)).  Blood Alcohol level:  Lab Results   Component Value Date   ETH 188 (H) 05/17/2020   ETH 129 (H) 70/96/4383    Metabolic Disorder Labs: Lab Results  Component Value Date   HGBA1C 4.7 11/17/2018   MPG 131 08/25/2014   MPG 154 (H) 10/30/2011   No results found for: PROLACTIN No results found for: CHOL, TRIG, HDL, CHOLHDL, VLDL, LDLCALC  Physical Findings: AIMS: Facial and Oral Movements Muscles of Facial Expression: None, normal Lips and Perioral Area: None, normal Jaw: None, normal Tongue: None, normal,Extremity Movements Upper (arms, wrists, hands, fingers): None, normal Lower (legs, knees, ankles, toes): None, normal, Trunk Movements Neck, shoulders, hips: None, normal, Overall Severity Severity of abnormal movements (highest score from questions above): None, normal Incapacitation due to abnormal movements: None, normal Patient's awareness of abnormal movements (rate only patient's report): No Awareness, Dental Status Current problems with teeth and/or dentures?: No Does patient usually wear dentures?: No  CIWA:  CIWA-Ar Total: 7     Musculoskeletal: Strength & Muscle Tone: within normal limits Gait & Station: unassessed - in bed Patient leans: N/A   Psychiatric Specialty Exam: Physical Exam Vitals reviewed.  HENT:     Head: Normocephalic.  Pulmonary:     Effort: Pulmonary effort is normal.  Musculoskeletal:     Comments: Dorsum of left foot with slight bruise - nontender to palpation  Neurological:     Mental Status: He is alert.     Review of Systems  Constitutional: Positive for chills and fatigue.  Respiratory: Negative for shortness of breath.   Cardiovascular: Negative for chest pain.  Gastrointestinal: Negative for abdominal pain, diarrhea, nausea and vomiting.  Neurological: Negative for headaches.    Blood pressure 93/69, pulse 72, temperature 97.7 F (36.5 C), temperature source Oral, resp. rate 16, height 6' 2"  (1.88 m), weight 80.3 kg, SpO2 98 %.Body mass index is 22.73 kg/m.   General Appearance: casually dressed, disheveled appearing with uncombed hair  Eye Contact:  Good  Speech:  Clear and Coherent and Normal Rate  Volume:  Normal  Mood:  Described as irritable   Affect:  constricted  Thought Process:  Goal Directed, linear  Orientation:  Oriented grossly to time, place, situation  Thought Content:  Logical and denies AVH, paranoia, or delusions; no acute psychosis on exam  Suicidal Thoughts:  No  Homicidal Thoughts:  No  Memory:  Recent;   Fair  Judgement:  Fair  Insight:  Fair  Psychomotor Activity:  Normal  Concentration:  Concentration: Good and Attention Span: Good  Recall:  [employer] of Knowledge:  Fair  Language:  Good  Akathisia:  Negative  Assets:  Communication Skills Desire for Improvement Resilience  ADL's:  Intact  Cognition:  WNL  Sleep:  Number of Hours: 6.75   Treatment Plan Summary: Diagnoses / Active Problems: Unspecified depressive d/o (r/o Substance induced depressive d/o) Alcohol use d/o severe GERD by hx Type II DM by hx ADHD by hx  H/o seizures and TBI s/p subdural hematoma Elevated LFTs  PLAN: 1. Safety and Monitoring:  -- Voluntary admission to inpatient psychiatric unit for safety, stabilization and treatment  -- Daily contact with patient to assess and evaluate symptoms and progress in treatment  -- Patient's case to be discussed in multi-disciplinary team meeting  -- Observation Level : q15 minute checks  -- Vital signs:  q12 hours  -- Precautions: suicide  2. Psychiatric Diagnoses and Treatment:   Unspecified depressive d/o (r/o substance induced depressive d/o)  -- Continue Prozac 109m daily for depressive symptoms   -- Continue Neurontin 30612mtid for anxiety and withdrawal  -- TSH 5.479  -- Encouraged patient to participate in unit milieu and in scheduled group therapies   -- Short Term Goals: Ability to identify changes in lifestyle to reduce recurrence of condition will improve and Ability to  identify and develop effective coping behaviors will improve  -- Long Term Goals: Improvement in symptoms so as ready for discharge   Alcohol use d/o severe  -- UDS positive for amphetamines and ETOH 188 on admission  -- Continue CIWA protocol with Ativan 12m57mor CIWA scores >10 and oral thiamine and folate replacement  -- Continue Neurontin 300m87md for withdrawal, cravings, and anxiety  -- Patient interested in residential rehab after discharge  -- Discussed the need to abstain from benzodiazepine and stimulant use after discharge given addictions history and discussed risk of respiratory suppression and death from mixing alcohol and benzodiazepines   -- Short Term Goals: Ability to identify triggers associated with substance abuse/mental health issues will improve  -- Long Term Goals: Improvement in symptoms so as ready for discharge   3. Medical Issues Being Addressed:   GERD   -- Continue Protonix 40mg12mly   Type II DM  -- Not on home medications per records  -- HbgA1c 4.6   Elevated LFTs   -- 05/19/20: AST 185 and ALT 138; alk phos 160 and total bili 2.0, direct bili 0.4, indirect bili 1.6 - will repeat in 2 days for trending  -- Holding Tylenol  -- Hepatitis panel pending and HIV nonreactive   Hypokalemia (K+ 3.0) - resolved  -- 05/19/20: K+ 3.8 and Mag+2.0 (remainder of BMP WNL)  -- Improved po intake encouraged    Thrombocytopenia (platelets 126)  -- appears chronically low when compared to previous labs - 86 9 months ago, 107 1 year ago)  -- Will need PCP f/u after discharge   H/o presumed alcohol withdrawal seizures with h/o  TBI/subdural hematoma in 2021  -- Continue Keppra 543m bid at this time   -- Continue Neurontin 3031mtid  -- Will need outpatient neurology f/u after discharge for additional management  -- On seizure precautions    Left foot pain  -- Radiographs negative for fracture  -- Has Ultram PRN for severe pain given NSAID allergy   Elevated  TSH  -- Checking T3U and FreeT4  4. Discharge Planning:   -- Social work and case management to assist with discharge planning and identification of hospital follow-up needs prior to discharge  -- Estimated LOS: 3-4 days  -- Discharge Concerns: Need to establish a safety plan; Medication compliance and effectiveness  -- Discharge Goals: Return home with outpatient referrals for mental health follow-up including medication management/psychotherapy  AmHarlow AsaMD, FAPA 05/19/2020, 6:49 AM

## 2020-05-19 NOTE — Progress Notes (Signed)
   05/19/20 1100  Psych Admission Type (Psych Patients Only)  Admission Status Voluntary  Psychosocial Assessment  Patient Complaints Substance abuse  Eye Contact Fair  Facial Expression Anxious;Sullen;Sad  Affect Anxious;Sad;Sullen  Theatre stage manager  Appearance/Hygiene Improved  Behavior Characteristics Cooperative  Mood Depressed  Thought Process  Coherency Concrete thinking  Content Blaming self  Perception WDL  Hallucination None reported or observed  Judgment Poor  Confusion None  Danger to Self  Current suicidal ideation? Denies  Self-Injurious Behavior No self-injurious ideation or behavior indicators observed or expressed   Danger to Others  Danger to Others None reported or observed

## 2020-05-19 NOTE — Progress Notes (Signed)
   05/19/20 0600  Sleep  Number of Hours 6.75  CIWA-Ar  Nausea and Vomiting 0  Tactile Disturbances 0  Tremor 2  Auditory Disturbances 0  Paroxysmal Sweats 1  Visual Disturbances 0  Anxiety 3  Headache, Fullness in Head 0  Agitation 1  Orientation and Clouding of Sensorium 0  CIWA-Ar Total 7

## 2020-05-19 NOTE — Progress Notes (Signed)
Pt visible on the unit, pt SA Sx appeared to be better. Pt was given PRN Trazodone and PRN Vistaril per MAR. Pt also given 1x Trazodone per [facility] for a total of 100 mg Trazodone for the evening.     05/19/20 2300  Psych Admission Type (Psych Patients Only)  Admission Status Voluntary  Psychosocial Assessment  Patient Complaints Substance abuse  Eye Contact Fair  Facial Expression Anxious;Sullen;Sad  Affect Anxious;Sad;Sullen  Theatre stage manager  Appearance/Hygiene Improved  Behavior Characteristics Cooperative  Mood Depressed  Thought Process  Coherency Concrete thinking  Content Blaming self  Perception WDL  Hallucination None reported or observed  Judgment Poor  Confusion None  Danger to Self  Current suicidal ideation? Denies  Self-Injurious Behavior No self-injurious ideation or behavior indicators observed or expressed   Danger to Others  Danger to Others None reported or observed

## 2020-05-19 NOTE — Progress Notes (Signed)
[facility] Notes:  (Nursing/MHT/Case Management/Adjunct)  Date:  05/19/2020  Time:  2015 Type of Therapy:  wrap up group  Participation Level:  Active  Participation Quality:  Appropriate, Attentive, Sharing and Supportive  Affect:  Flat  Cognitive:  Appropriate  Insight:  Improving  Engagement in Group:  Engaged  Modes of Intervention:  Clarification, Education and Support  Summary of Progress/Problems: Positive thinking and positive change were discussed.   Shellia Cleverly 05/19/2020, 8:59 PM

## 2020-05-20 MED ORDER — ONDANSETRON 4 MG PO TBDP: 4.0000 mg | ORAL_TABLET | Freq: Four times a day (QID) | ORAL | Status: DC | PRN

## 2020-05-20 MED ORDER — NICOTINE 21 MG/24HR TD PT24
21.0000 mg | MEDICATED_PATCH | Freq: Every day | TRANSDERMAL | Status: DC
  Administered 2020-05-20 – 2020-05-21 (×2): 21 mg via TRANSDERMAL
  Filled 2020-05-20 (×5): qty 1

## 2020-05-20 MED ORDER — GABAPENTIN 100 MG PO CAPS
ORAL_CAPSULE | ORAL | Status: AC
  Filled 2020-05-20: qty 2

## 2020-05-20 MED ORDER — LEVOTHYROXINE SODIUM 25 MCG PO TABS
25.0000 ug | ORAL_TABLET | Freq: Every day | ORAL | Status: DC
  Administered 2020-05-20 – 2020-05-23 (×4): 25 ug via ORAL
  Filled 2020-05-20 (×6): qty 1

## 2020-05-20 MED ORDER — GABAPENTIN 100 MG PO CAPS
200.0000 mg | ORAL_CAPSULE | Freq: Every day | ORAL | Status: AC
  Administered 2020-05-21: 200 mg via ORAL
  Filled 2020-05-20: qty 2

## 2020-05-20 MED ORDER — LOPERAMIDE HCL 2 MG PO CAPS: 2.0000 mg | ORAL_CAPSULE | ORAL | Status: DC | PRN

## 2020-05-20 MED ORDER — LORAZEPAM 1 MG PO TABS: 1.0000 mg | ORAL_TABLET | Freq: Four times a day (QID) | ORAL | Status: DC | PRN

## 2020-05-20 MED ORDER — HYDROXYZINE HCL 25 MG PO TABS
25.0000 mg | ORAL_TABLET | Freq: Four times a day (QID) | ORAL | Status: DC | PRN
  Administered 2020-05-20 – 2020-05-22 (×4): 25 mg via ORAL
  Filled 2020-05-20 (×4): qty 1

## 2020-05-20 MED ORDER — GABAPENTIN 100 MG PO CAPS
200.0000 mg | ORAL_CAPSULE | Freq: Three times a day (TID) | ORAL | Status: AC
  Administered 2020-05-20 (×2): 200 mg via ORAL
  Filled 2020-05-20 (×2): qty 2

## 2020-05-20 NOTE — Progress Notes (Addendum)
[facility] MD Progress Note  05/20/2020 6:33 AM Brandon Miller  MRN:  [id number]   Chief Complaint: suicidal ideation, depression, and alcohol abuse  Subjective: Brandon Miller is a 55 y.o. male with a history of alcohol use d/o, alcohol-induced mood d/o, ADHD, h/o seizures, and TBI s/p subdural hematoma, who was initially admitted for inpatient psychiatric hospitalization on 05/17/2020 for management of SI, worsening depression, and alcohol abuse. The patient is currently on Hospital Day 3.   Chart Review from last 24 hours:  The patient's chart was reviewed and nursing notes were reviewed. The patient's case was discussed in multidisciplinary team meeting. Per nursing patient slept 3.75 hours. No behavioral issues or safety concerns noted. Per MAR he was compliant with scheduled medications and did receive Vistaril X1 for anxiety, Ativan X1 for CIWA score, and Trazodone X2 for sleep.Recent CIWA scores: 7,1,1  Information Obtained Today During Patient Interview: The patient was seen and evaluated on the unit. On rounds the patient states he did not sleep well overnight but admits he did sleep off and on during the day yesterday. He states he is feeling overly sedated during the day and is unsure if this is due to medications or withdrawal. He states his withdrawal symptoms got worse yesterday afternoon in terms of having more prominent tremors. He denies current issues with sweats, chills or GI symptoms. He admits to cravings for alcohol and is still interested in residential treatment after discharge. He states he showered yesterday and changed clothes, and he was encouraged to be out of bed and attending groups. He denies SI, HI, AVH, paranoia,or delusions. He reports stable appetite and reports that his mood is overall better. He denies medication side-effects. I discussed his elevated LFTs and his thyroid lab abnormalities. Based on recommendations from internal medicine I advised that he will be  started on low dose synthroid with plans to recheck his thyroid after discharge. I advised that we will repeat his LFTs for trending.   Principal Problem: Depression, unspecified Diagnosis: Principal Problem:   Depression, unspecified Active Problems:   Diabetes mellitus, type 2 (HCC)   Elevated LFTs   Alcohol use disorder, severe, dependence (HCC)   GERD (gastroesophageal reflux disease)   Substance induced mood disorder ([place])   History of seizures  Total Time Spent in Direct Patient Care:  I personally spent 25 minutes on the unit in direct patient care. The direct patient care time included face-to-face time with the patient, reviewing the patient's chart, communicating with other professionals, and coordinating care. Greater than 50% of this time was spent in counseling or coordinating care with the patient regarding goals of hospitalization, psycho-education, and discharge planning needs.  Past Psychiatric History: see admission H&P  Past Medical History:  Past Medical History:  Diagnosis Date  . Allergy   . Anemia   . Anxiety   . Arthritis    RA  . Complication of anesthesia    hard to wake up-was told he may have sleep apnea-never tested  . Depression   . Diabetes mellitus without complication ([place])    with weight no more problems  . Seizure ([place])    had one seizure of unknown etiology a few tears ago, none since and on no meds for them  . Sleep apnea   . Snores   h/o subdural hematoma 12/2019  Past Surgical History:  Procedure Laterality Date  . BIOPSY  02/12/2019   Procedure: BIOPSY;  Surgeon: Daneil Dolin, MD;  [facility];  Service: Endoscopy;;  stomach  . COLONOSCOPY WITH PROPOFOL N/A 02/12/2019   Procedure: COLONOSCOPY WITH PROPOFOL;  Surgeon: Daneil Dolin, MD;  [facility];  Service: Endoscopy;  Laterality: N/A;  10:30am  . ELBOW SURGERY Right 2010   Dr. Percell Miller  . ESOPHAGOGASTRODUODENOSCOPY (EGD) WITH PROPOFOL N/A 02/12/2019    Procedure: ESOPHAGOGASTRODUODENOSCOPY (EGD) WITH PROPOFOL;  Surgeon: Daneil Dolin, MD;  [facility];  Service: Endoscopy;  Laterality: N/A;  . HERNIA REPAIR     Right inguinal  . KNEE ARTHROSCOPY WITH MEDIAL MENISECTOMY Right 06/11/2013   Procedure: RIGHT KNEE ARTHROSCOPY WITH MEDIAL MENISECTOMY AND CHONDROPLASTY;  Surgeon: Ninetta Lights, MD;  Location: [facility];  Service: Orthopedics;  Laterality: Right;  Chondroplasty, loose bodies, medial menisectomy, lysis of adhesions.  Marland Kitchen KNEE SURGERY Right 2011  . POLYPECTOMY  02/12/2019   Procedure: POLYPECTOMY;  Surgeon: Daneil Dolin, MD;  [facility];  Service: Endoscopy;;  . right ankle    . SHOULDER SURGERY Right 2010   Dr. Percell Miller  . TOTAL KNEE ARTHROPLASTY Right 08/25/2014   Procedure: TOTAL KNEE ARTHROPLASTY;  Surgeon: Ninetta Lights, MD;  Location: [facility];  Service: Orthopedics;  Laterality: Right;   Family History:  Family History  Problem Relation Age of Onset  . Hepatitis Mother   . Cirrhosis Mother        Did not drink  . Pneumonia Father   . Kidney failure Father   . Gastric cancer Brother        ? Gastric CA spread to esophagus  . Colon cancer Neg Hx    Family Psychiatric  History: see admission H&P  Social History:  Social History   Substance and Sexual Activity  Alcohol Use Yes   Comment: 10-20/day cans beer     Social History   Substance and Sexual Activity  Drug Use No    Social History   Socioeconomic History  . Marital status: Single    Spouse name: Not on file  . Number of children: Not on file  . Years of education: Not on file  . Highest education level: Not on file  Occupational History  . Occupation: unemployed  Tobacco Use  . Smoking status: Current Every Day Smoker    Packs/day: 1.50    Years: 25.00    Pack years: 37.50    Types: Cigarettes  . Smokeless tobacco: Never Used  Vaping Use  . Vaping Use: Never used  Substance and Sexual Activity  .  Alcohol use: Yes    Comment: 10-20/day cans beer  . Drug use: No  . Sexual activity: Not Currently  Other Topics Concern  . Not on file  Social History Narrative  . Not on file   Social Determinants of Health   Financial Resource Strain: Not on file  Food Insecurity: Not on file  Transportation Needs: Not on file  Physical Activity: Not on file  Stress: Not on file  Social Connections: Not on file   Sleep: sleeping during day and poor sleep overnight  Appetite:  Improved  Current Medications: Current Facility-Administered Medications  Medication Dose Route Frequency Provider Last Rate Last Admin  . alum & mag hydroxide-simeth (MAALOX/MYLANTA) 200-200-20 MG/5ML suspension 30 mL  30 mL Oral Q4H PRN Sharma Covert, MD      . feeding supplement (ENSURE ENLIVE / ENSURE PLUS) liquid 237 mL  237 mL Oral BID BM Mallie Darting Cordie Grice, MD   237 mL at  05/19/20 1816  . FLUoxetine (PROZAC) capsule 20 mg  20 mg Oral Daily Sharma Covert, MD   20 mg at 05/19/20 0816  . folic acid (FOLVITE) tablet 1 mg  1 mg Oral Daily Sharma Covert, MD   1 mg at 05/19/20 0816  . gabapentin (NEURONTIN) capsule 300 mg  300 mg Oral TID Sharma Covert, MD   300 mg at 05/19/20 1817  . hydrOXYzine (ATARAX/VISTARIL) tablet 25 mg  25 mg Oral Q6H PRN Sharma Covert, MD   25 mg at 05/19/20 2127  . levETIRAcetam (KEPPRA) tablet 500 mg  500 mg Oral BID Sharma Covert, MD   500 mg at 05/19/20 1817  . loperamide (IMODIUM) capsule 2-4 mg  2-4 mg Oral PRN Sharma Covert, MD      . LORazepam (ATIVAN) tablet 1 mg  1 mg Oral Q6H PRN Sharma Covert, MD   1 mg at 05/19/20 1347  . magnesium hydroxide (MILK OF MAGNESIA) suspension 30 mL  30 mL Oral Daily PRN Sharma Covert, MD      . multivitamin with minerals tablet 1 tablet  1 tablet Oral Daily Sharma Covert, MD   1 tablet at 05/19/20 [phone]  . ondansetron (ZOFRAN-ODT) disintegrating tablet 4 mg  4 mg Oral Q6H PRN Sharma Covert, MD      .  pantoprazole (PROTONIX) EC tablet 40 mg  40 mg Oral Daily Sharma Covert, MD   40 mg at 05/19/20 0816  . thiamine tablet 100 mg  100 mg Oral Daily Sharma Covert, MD   100 mg at 05/19/20 0816  . traMADol (ULTRAM) tablet 50 mg  50 mg Oral Q12H PRN Nelda Marseille, Ashe Graybeal E, MD      . traZODone (DESYREL) tablet 50 mg  50 mg Oral QHS PRN Sharma Covert, MD   50 mg at 05/19/20 2126    Lab Results:  Results for orders placed or performed during the hospital encounter of 05/17/20 (from the past 48 hour(s))  HIV Antibody (routine testing w rflx)     Status: None   Collection Time: 05/19/20  6:19 AM  Result Value Ref Range   HIV Screen 4th Generation wRfx Non Reactive Non Reactive    Comment: Performed at [facility], [place] [address] [place], [place] [address]  Hepatitis panel, acute     Status: None   Collection Time: 05/19/20  6:19 AM  Result Value Ref Range   Hepatitis B Surface Ag NON REACTIVE NON REACTIVE   HCV Ab NON REACTIVE NON REACTIVE    Comment: (NOTE) Nonreactive HCV antibody screen is consistent with no HCV infections,  unless recent infection is suspected or other evidence exists to indicate HCV infection.     Hep A IgM NON REACTIVE NON REACTIVE   Hep B C IgM NON REACTIVE NON REACTIVE    Comment: Performed at [facility], [place] [address] [place], [place] [address]  Hepatic function panel     Status: Abnormal   Collection Time: 05/19/20  6:19 AM  Result Value Ref Range   Total Protein 7.3 6.5 - 8.1 g/dL   Albumin 3.9 3.5 - 5.0 g/dL   AST 185 (H) 15 - 41 U/L   ALT 138 (H) 0 - 44 U/L   Alkaline Phosphatase 160 (H) 38 - 126 U/L   Total Bilirubin 2.0 (H) 0.3 - 1.2 mg/dL   Bilirubin, Direct 0.4 (H) 0.0 - 0.2 mg/dL  Indirect Bilirubin 1.6 (H) 0.3 - 0.9 mg/dL    Comment: Performed at [facility], [place] [address] [place], [place] [address]  Magnesium     Status: None   Collection Time: 05/19/20  6:19 AM  Result Value Ref Range   Magnesium  2.0 1.7 - 2.4 mg/dL    Comment: Performed at [facility], [place] [address], [place], [place] [address]  Basic metabolic panel     Status: None   Collection Time: 05/19/20  6:19 AM  Result Value Ref Range   Sodium 139 135 - 145 mmol/L   Potassium 3.8 3.5 - 5.1 mmol/L   Chloride 99 98 - 111 mmol/L   CO2 29 22 - 32 mmol/L   Glucose, Bld 88 70 - 99 mg/dL    Comment: Glucose reference range applies only to samples taken after fasting for at least 8 hours.   BUN 13 6 - 20 mg/dL   Creatinine, Ser 0.86 0.61 - 1.24 mg/dL   Calcium 9.6 8.9 - 10.3 mg/dL   GFR, Estimated >60 >60 mL/min    Comment: (NOTE) Calculated using the CKD-EPI Creatinine Equation (2021)    Anion gap 11 5 - 15    Comment: Performed at [facility], [place] [address], [place], [place] [address]  Hemoglobin A1c     Status: Abnormal   Collection Time: 05/19/20  6:19 AM  Result Value Ref Range   Hgb A1c MFr Bld 4.6 (L) 4.8 - 5.6 %    Comment: (NOTE) Pre diabetes:          5.7%-6.4%  Diabetes:              >6.4%  Glycemic control for   <7.0% adults with diabetes    Mean Plasma Glucose 85.32 mg/dL    Comment: Performed at [place] [address], [place], [place] [address]  TSH     Status: Abnormal   Collection Time: 05/19/20  6:19 AM  Result Value Ref Range   TSH 5.479 (H) 0.350 - 4.500 uIU/mL    Comment: Performed by a 3rd Generation assay with a functional sensitivity of <=0.01 uIU/mL. Performed at [facility], [place] [address], [place], [place] [address]   T4, free     Status: Abnormal   Collection Time: 05/19/20  6:28 PM  Result Value Ref Range   Free T4 0.59 (L) 0.61 - 1.12 ng/dL    Comment: (NOTE) Biotin ingestion may interfere with free T4 tests. If the results are inconsistent with the TSH level, previous test results, or the clinical presentation, then consider biotin interference. If needed, order repeat testing after stopping  biotin. Performed at [facility], [place] [address], [place], [place] [address]     Blood Alcohol level:  Lab Results  Component Value Date   ETH 188 (H) 05/17/2020   ETH 129 (H) 57/84/6962    Metabolic Disorder Labs: Lab Results  Component Value Date   HGBA1C 4.6 (L) 05/19/2020   MPG 85.32 05/19/2020   MPG 131 08/25/2014   No results found for: PROLACTIN No results found for: CHOL, TRIG, HDL, CHOLHDL, VLDL, LDLCALC  Physical Findings: AIMS: Facial and Oral Movements Muscles of Facial Expression: None, normal Lips and Perioral Area: None, normal Jaw: None, normal Tongue: None, normal,Extremity Movements Upper (arms, wrists, hands, fingers): None, normal Lower (legs, knees, ankles, toes): None, normal, Trunk Movements Neck, shoulders, hips: None, normal, Overall Severity Severity of abnormal movements (  highest score from questions above): None, normal Incapacitation due to abnormal movements: None, normal Patient's awareness of abnormal movements (rate only patient's report): No Awareness, Dental Status Current problems with teeth and/or dentures?: No Does patient usually wear dentures?: No  CIWA:  CIWA-Ar Total: (P) 1     Musculoskeletal: Strength & Muscle Tone: within normal limits Gait & Station: steady, normal Patient leans: N/A  Psychiatric Specialty Exam: Physical Exam Vitals reviewed.  HENT:     Head: Normocephalic.  Pulmonary:     Effort: Pulmonary effort is normal.  Skin:    Comments: No obvious signs of jaundice  Neurological:     Mental Status: He is alert.     Review of Systems  Constitutional: Positive for fatigue. Negative for chills.  Respiratory: Negative for shortness of breath.   Cardiovascular: Negative for chest pain.  Gastrointestinal: Negative for abdominal pain, diarrhea, nausea and vomiting.  Neurological: Positive for tremors. Negative for headaches.    Blood pressure 96/67, pulse 92, temperature (!) 97.3 F (36.3 C),  temperature source Oral, resp. rate 18, height $RemoveBe'6\' 2"'UuHZUIjoI$  (1.88 m), weight 80.3 kg, SpO2 99 %.Body mass index is 22.73 kg/m.  General Appearance: casually dressed, improved hygiene - wearing clean clothes  Eye Contact:  Good  Speech:  Clear and Coherent and Normal Rate  Volume:  Normal  Mood:  Described as "better" - appears dysphoric  Affect:  constricted  Thought Process:  Goal Directed, linear  Orientation:  Oriented grossly to time, place, situation  Thought Content:  Logical and denies AVH, paranoia, or delusions; no acute psychosis on exam  Suicidal Thoughts:  No  Homicidal Thoughts:  No  Memory:  Recent;   Fair  Judgement:  Fair  Insight:  Fair  Psychomotor Activity:  Normal  Concentration:  Concentration: Good and Attention Span: Good  Recall:  [employer] of Knowledge:  Fair  Language:  Good  Akathisia:  Negative  Assets:  Communication Skills Desire for Improvement Resilience  ADL's:  Intact  Cognition:  WNL  Sleep:  Number of Hours: 3.75   Treatment Plan Summary: Diagnoses / Active Problems: Unspecified depressive d/o (r/o Substance induced depressive d/o) Alcohol use d/o severe GERD by hx Type II DM by hx ADHD by hx  H/o seizures and TBI s/p subdural hematoma Elevated LFTs Hypothyroidism  PLAN: 1. Safety and Monitoring:  -- Voluntary admission to inpatient psychiatric unit for safety, stabilization and treatment  -- Daily contact with patient to assess and evaluate symptoms and progress in treatment  -- Patient's case to be discussed in multi-disciplinary team meeting  -- Observation Level : q15 minute checks  -- Vital signs:  q12 hours  -- Precautions: suicide  2. Psychiatric Diagnoses and Treatment:   Unspecified depressive d/o (r/o substance induced depressive d/o)  -- Continue Prozac $RemoveBeforeD'20mg'lYdWSJitoaYlxm$  daily for depressive symptoms   -- Tapering off Neurontin given recommendations from IM in context of his elevated LFTs and due to daytime sedation - has PRN Vistaril  for anxiety  -- Encouraged patient to participate in unit milieu and in scheduled group therapies   -- Short Term Goals: Ability to identify changes in lifestyle to reduce recurrence of condition will improve and Ability to identify and develop effective coping behaviors will improve  -- Long Term Goals: Improvement in symptoms so as ready for discharge   Alcohol use d/o severe  -- UDS positive for amphetamines and ETOH 188 on admission  -- Continue CIWA protocol with Ativan $RemoveBef'1mg'lWDqWwBcXl$  for CIWA scores >  10 and oral thiamine and folate replacement  -- Tapering off Neurontin given recommendations from IM in context of his elevated LFTs and due to daytime sedation; he will continue Keppra d/t seizure Hx  -- Patient interested in residential rehab after discharge  -- Discussed the need to abstain from benzodiazepine and stimulant use after discharge given addictions history and discussed risk of respiratory suppression and death from mixing alcohol and benzodiazepines   -- Short Term Goals: Ability to identify triggers associated with substance abuse/mental health issues will improve  -- Long Term Goals: Improvement in symptoms so as ready for discharge   3. Medical Issues Being Addressed:   GERD   -- Continue Protonix 38m daily   Type II DM  -- Not on home medications per records  -- HbgA1c 4.6   Elevated LFTs   -- 05/19/20: AST 185 and ALT 138; alk phos 160 and total bili 2.0, direct bili 0.4, indirect bili 1.6 - will repeat Sunday for trending  -- Holding Tylenol  -- Hepatitis panel nonreactive and HIV nonreactive  -- spoke with Dr. KMarylyn Ishiharawith internal medicine and he recommends discontinuing Neurontin if not necessary and to continue [place] now d/t seizure Hx    Hypokalemia (K+ 3.0) - resolved  -- 05/19/20: K+ 3.8 and Mag+2.0 (remainder of BMP WNL)  -- Improved po intake encouraged    Thrombocytopenia (platelets 126)  -- appears chronically low when compared to previous labs - 86 9 months  ago, 107 1 year ago)  -- Will need PCP f/u after discharge   H/o presumed alcohol withdrawal seizures with h/o TBI/subdural hematoma in 2021  -- Continue Keppra 503mbid at this time   -- Tapering off Neurontin given recommendations from IM in context of his elevated LFTs and due to daytime sedation  -- Will need outpatient neurology f/u after discharge for additional management  -- On seizure precautions    Left foot pain  -- Radiographs negative for fracture  -- Has Ultram PRN for severe pain given NSAID allergy   Hypothyroidism  -- Checking T3U and FreeT4 low at 0.59 and TSH elevated at 5.479  -- per recommendations from IM will start low dose synthroid 2545mwith plans to recheck his thyroid labs in 4-6 weeks  -- will refer to PCP for recheck after discharge  4. Discharge Planning:   -- Social work and case management to assist with discharge planning and identification of hospital follow-up needs prior to discharge  -- Estimated LOS: 3-4 days  -- Discharge Concerns: Need to establish a safety plan; Medication compliance and effectiveness  -- Discharge Goals: Return home with outpatient referrals for mental health follow-up including medication management/psychotherapy  AmyHarlow AsaD, FAPA 05/20/2020, 6:33 AM

## 2020-05-20 NOTE — Consult Note (Signed)
.  Medical Consultation  Brandon Miller QIO:[id number] DOB: 1965-06-14 DOA: 05/17/2020 PCP: [facility]   Requesting physician: Dr. Nelda Marseille Date of consultation: 05/20/20 Reason for consultation: Elevated LFTs, low FT4  Impression/Recommendations Elevated LFTs     - hep panel ordered and is non-reactive across the board     - he was started on neurontin and keppra; both of which can affect LFTs; I would say continue keppra for now d/t his seizure Hx; d/c neurontin if it is not necessary     - his LFTs have increased over the last two days; given lack of symptoms, would trend for now; if sharp increase noted; reconsult [facility]  Hypothyroidism     - this sounds subclinical, but there would be no harm in starting low dose levothyroxine (25 - 50 mcg) w/ lab follow up in 4 - 6 weeks  TR[facility] will sign off. Please reconsult if needed.  Chief Complaint: EtOH abuse  HPI:  Brandon Miller is a 55 y.o. male with medical history significant of EtOH abuse.  AS PER PSYC H&P:  Patient is a 55 year old male with a past psychiatric history significant for alcohol dependence who presented to the [facility] on 04/2020 with law enforcement for suicidal ideation.  The patient stated that he is sister called the police on the date of admission because he had made a suicidal statement.  The patient stated that "I did not really mean it".  He is frustrated and struggling.  He has been drinking approximately a case of beer a day.  He has been unemployed, and that made things worse.  He is getting out of a bad relationship with a male.  He has had some worsening medical problems including a previous head injury and postconcussion syndrome.  He had a psychiatric hospitalization for similar circumstances at [facility] in 01/09/2020.  He stated he only stayed sober for a few days after that, and did not go to rehab.  Shortly after  that he suffered a subdural hematoma and was in the [facility] emergency department on 01/21/2020 with that.  He has followed up as an outpatient with neuro.  He admitted that he had had a seizure approximately 3 years ago.  This was an alcohol-related seizure.  He is also prescribed clonazepam 1 mg p.o. twice daily and Adderall 20 mg p.o. daily by his primary care provider.  There is also hydrocodone prescription from 03/31/2020.  He admitted to helplessness, hopelessness and worthlessness.  He denied suicidal ideation.  He is considering going to a rehabilitation facility.  He has not decided at this point.  He was admitted to the hospital for evaluation and stabilization.  [facility] called today for interpretation and recommendations on liver function tests and thyroid studies. It was noted that patient had elevated LFTs at admission and they have marginally increased over the last 2 days. There was concern presented that the initiated keppra and neurontin could be contributing. Advice on proceeding was requested. [facility] MD requested advice on interpretation and treatment of an elevated TSH and low FT4. Per [facility] MD, patient's symptoms include depression. He is absent of jaundice, asterixis, acute encephalopathy.     Past Medical History:  Diagnosis Date  . Allergy   . Anemia   . Anxiety   . Arthritis    RA  . Complication of anesthesia    hard to wake up-was told he may have  sleep apnea-never tested  . Depression   . Diabetes mellitus without complication ([place])    with weight no more problems  . Seizure ([place])    had one seizure of unknown etiology a few tears ago, none since and on no meds for them  . Sleep apnea   . Snores    Past Surgical History:  Procedure Laterality Date  . BIOPSY  02/12/2019   Procedure: BIOPSY;  Surgeon: Daneil Dolin, MD;  [facility];  Service: Endoscopy;;  stomach  . COLONOSCOPY WITH PROPOFOL N/A 02/12/2019   Procedure: COLONOSCOPY WITH  PROPOFOL;  Surgeon: Daneil Dolin, MD;  [facility];  Service: Endoscopy;  Laterality: N/A;  10:30am  . ELBOW SURGERY Right 2010   Dr. Percell [facility]  . ESOPHAGOGASTRODUODENOSCOPY (EGD) WITH PROPOFOL N/A 02/12/2019   Procedure: ESOPHAGOGASTRODUODENOSCOPY (EGD) WITH PROPOFOL;  Surgeon: Daneil Dolin, MD;  [facility];  Service: Endoscopy;  Laterality: N/A;  . HERNIA REPAIR     Right inguinal  . KNEE ARTHROSCOPY WITH MEDIAL MENISECTOMY Right 06/11/2013   Procedure: RIGHT KNEE ARTHROSCOPY WITH MEDIAL MENISECTOMY AND CHONDROPLASTY;  Surgeon: Ninetta Lights, MD;  Location: [place];  Service: Orthopedics;  Laterality: Right;  Chondroplasty, loose bodies, medial menisectomy, lysis of adhesions.  Marland Kitchen KNEE SURGERY Right 2011  . POLYPECTOMY  02/12/2019   Procedure: POLYPECTOMY;  Surgeon: Daneil Dolin, MD;  [facility];  Service: Endoscopy;;  . right ankle    . SHOULDER SURGERY Right 2010   Dr. Percell [facility]  . TOTAL KNEE ARTHROPLASTY Right 08/25/2014   Procedure: TOTAL KNEE ARTHROPLASTY;  Surgeon: Ninetta Lights, MD;  Location: [place];  Service: Orthopedics;  Laterality: Right;   Social History:  reports that he has been smoking cigarettes. He has a 37.50 pack-year smoking history. He has never used smokeless tobacco. He reports current alcohol use. He reports that he does not use drugs.  Allergies  Allergen Reactions  . Daypro [Oxaprozin] Other (See Comments)    'tears his stomach up"  . Diclofenac Sodium Other (See Comments)    dizzy  . Indocin [Indomethacin] Nausea And Vomiting  . Zanaflex [Tizanidine Hcl] Swelling   Family History  Problem Relation Age of Onset  . Hepatitis Mother   . Cirrhosis Mother        Did not drink  . Pneumonia Father   . Kidney failure Father   . Gastric cancer Brother        ? Gastric CA spread to esophagus  . Colon cancer Neg Hx     Prior to Admission medications   Medication Sig Start Date End Date Taking?  Authorizing Provider  clonazePAM (KLONOPIN) 1 MG tablet Take 1 mg by mouth 2 (two) times daily.    [provider]  FLUoxetine (PROZAC) 20 MG capsule Take 1 capsule by mouth daily. 03/16/20 09/12/20  [provider]  gabapentin (NEURONTIN) 300 MG capsule Take 1 capsule (300 mg total) by mouth 3 (three) times daily. 05/17/20   Ival Bible, MD  LORazepam (ATIVAN) 1 MG tablet Take 1 tablet (1 mg total) by mouth every 6 (six) hours as needed (CIWA > 10). 05/17/20   Ival Bible, MD  Multiple Vitamin (MULTIVITAMIN WITH MINERALS) TABS tablet Take 1 tablet by mouth daily. 05/18/20   Ival Bible, MD  thiamine 100 MG tablet Take 1 tablet (100 mg total) by mouth daily. 05/18/20   Ival Bible, MD   Physical  Exam: Blood pressure 96/67, pulse 92, temperature (!) 97.3 F (36.3 C), temperature source Oral, resp. rate 18, height 6\' 2"  (1.88 m), weight 80.3 kg, SpO2 99 %. Vitals:   05/20/20 0621 05/20/20 0623  BP: 113/73 96/67  Pulse: 68 92  Resp:  18  Temp:  (!) 97.3 F (36.3 C)  SpO2:  99%    No Physical Exam  Labs on Admission:  Basic Metabolic Panel: Recent Labs  Lab 05/17/20 0125 05/19/20 0619  NA 135 139  K 3.0* 3.8  CL 95* 99  CO2 26 29  GLUCOSE 122* 88  BUN 8 13  CREATININE 0.78 0.86  CALCIUM 8.9 9.6  MG  --  2.0   Liver Function Tests: Recent Labs  Lab 05/17/20 0125 05/19/20 0619  AST 141* 185*  ALT 65* 138*  ALKPHOS 107 160*  BILITOT 1.6* 2.0*  PROT 7.6 7.3  ALBUMIN 4.1 3.9   No results for input(s): LIPASE, AMYLASE in the last 168 hours. No results for input(s): AMMONIA in the last 168 hours. CBC: Recent Labs  Lab 05/17/20 0125  WBC 5.9  NEUTROABS 3.3  HGB 16.3  HCT 46.7  MCV 93.0  PLT 126*   Cardiac Enzymes: No results for input(s): CKTOTAL, CKMB, CKMBINDEX, TROPONINI in the last 168 hours. BNP: Invalid input(s): POCBNP CBG: No results for input(s): GLUCAP in the last 168 hours.  Radiological Exams on  Admission: No results found.  Time spent: 15 minutes  [facility]  If 7PM-7AM, please contact night-coverage [email or web address] 05/20/2020, 8:50 AM

## 2020-05-20 NOTE — BHH Group Notes (Signed)
Type of Therapy and Topic: Group Therapy: Anger Management   Participation: Active  Description of Group: In this group, patients will learn helpful strategies and techniques to manage anger, express anger in alternative ways, change hostile attitudes, and prevent aggressive acts, such as verbal abuse and violence.This group will be process-oriented and eductional, with patients participating in exploration of their own experiences as well as giving and receiving support and challenge from other group members.  Therapeutic Goals: 1. Patient will learn to manage anger. 2. Patient will learn to stop violence or the threat of violence. 3. Patient will learn to develop self control over thoughts and actions. 4. Patient will receive support and feedback from others  CSW provided worksheet packets for group members and answered any questions that were asked during this time.   Therapeutic Modalities: Cognitive Behavioral Therapy Solution Focused Therapy Motivational Interviewing   Daelyn Pettaway, LCSW, [place] Social Worker  [facility]

## 2020-05-20 NOTE — BHH Counselor (Signed)
Pt has been accepted to [facility] but will not be able to have Klonopin or Adderall at the facility. There will be no beds until Tuesday or Wednesday.   [facility] has accepted pt and asked that we call on Monday for possible bed on Wednesday.  Toney Reil, [place] Worker [employer]

## 2020-05-20 NOTE — Plan of Care (Signed)
  Problem: Self-Concept: Goal: Level of anxiety will decrease Outcome: Progressing Goal: Ability to modify response to factors that promote anxiety will improve Outcome: Progressing   Problem: Education: Goal: Utilization of techniques to improve thought processes will improve Outcome: Progressing

## 2020-05-20 NOTE — BHH Suicide Risk Assessment (Signed)
[place] INPATIENT:  Family/Significant Other Suicide Prevention Education  Suicide Prevention Education:  Education Completed;  Keenan Bachelor (sister) [phone] ,  (name of family member/significant other) has been identified by the patient as the family member/significant other with whom the patient will be residing, and identified as the person(s) who will aid the patient in the event of a mental health crisis (suicidal ideations/suicide attempt).  With written consent from the patient, the family member/significant other has been provided the following suicide prevention education, prior to the and/or following the discharge of the patient.  The suicide prevention education provided includes the following:  Suicide risk factors  Suicide prevention and interventions  National Suicide Hotline telephone number  [facility] assessment telephone number  [facility] Emergency Assistance [place] and/or Residential Mobile Crisis Unit telephone number  Request made of family/significant other to:  Remove weapons (e.g., guns, rifles, knives), all items previously/currently identified as safety concern.    Remove drugs/medications (over-the-counter, prescriptions, illicit drugs), all items previously/currently identified as a safety concern.  The family member/significant other verbalizes understanding of the suicide prevention education information provided.  The family member/significant other agrees to remove the items of safety concern listed above.  "He is not mentally stable. He is a really sad alcoholic. It started when our dad died in 05/05/99. Then our passed and then he had knee surgery and he lost his job he worked at for almost 30 years. He has periods where he does well but then he goes back to drinking. He envies that I have kids and grandchildren. No one lives in [place] where he lives. Sometimes I have had to drive to his house daily because he has been  so unstable. I am worried that if he does not go straight to rehab he will hurt himself or someone else. He will just start drinking again if he gets discharged home. I have invited him to stay with me but he does not want to [place]. He told me Monday night that he just wants to end it all. I don't know what to do with him anymore. I don't want him to be released. He needs deep help before he hurts himself or someone else." Ms. Nickola Major states that pt has many weapons in his home including rifles.   shotguns, handguns and a safe that only he has the code to. She states that he will be upset if these are removed. CSW explained that due to what brought to the hospital that pt cannot be discharged until all weapons are removed from the home and secured. Ms. Nickola Major states that she understood and would work on this over the weekend.     CSW will follow-up with Ms. Cockerham on 4/30 to confirm that firearms have been secured.  Mliss Fritz 05/20/2020, 3:48 PM

## 2020-05-20 NOTE — Progress Notes (Signed)
   05/20/20 0500  Sleep  Number of Hours 3.75

## 2020-05-20 NOTE — Progress Notes (Signed)
Progress note    05/20/20 0738  Psych Admission Type (Psych Patients Only)  Admission Status Voluntary  Psychosocial Assessment  Patient Complaints Anxiety  Eye Contact Fair  Facial Expression Anxious;Pensive;Sullen;Sad  Affect Anxious;Sad;Sullen  Speech Logical/coherent  Interaction Assertive  Motor Activity Fidgety  Appearance/Hygiene Unremarkable  Behavior Characteristics Cooperative;Appropriate to situation;Anxious;Fidgety  Mood Depressed;Anxious;Sad;Sullen;Pleasant  Thought Pension scheme manager thinking  Content Blaming self  Delusions None reported or observed  Perception WDL  Hallucination None reported or observed  Judgment Poor  Confusion None  Danger to Self  Current suicidal ideation? Denies  Self-Injurious Behavior No self-injurious ideation or behavior indicators observed or expressed   Danger to Others  Danger to Others None reported or observed

## 2020-05-20 NOTE — Progress Notes (Signed)
Recreation Therapy Notes  Date:  4.29.22 Time: 0935 Location: [place]  Group Topic: Stress Management  Goal Area(s) Addresses:  Patient will identify positive stress management techniques. Patient will identify benefits of using stress management post d/c.  Intervention: Stress Management  Activity:  Meditation.  LRT played a meditation that focused on calming anxiety.  Patients were to listen as the meditation played and focus on their breathing, releasing any tension and what they are feeling.    Education:  Stress Management, Discharge Planning.   Education Outcome: Acknowledges Education  Clinical Observations/Feedback: Pt did not attend group session.    Victorino Sparrow, LRT/CTRS         Ria Comment, Morissa Obeirne A 05/20/2020 11:30 AM

## 2020-05-21 LAB — T3 UPTAKE: T3 Uptake Ratio: 45 % — ABNORMAL HIGH (ref 24–39)

## 2020-05-21 MED ORDER — GABAPENTIN 100 MG PO CAPS
100.0000 mg | ORAL_CAPSULE | Freq: Three times a day (TID) | ORAL | Status: AC
  Administered 2020-05-21 – 2020-05-22 (×3): 100 mg via ORAL
  Filled 2020-05-21 (×4): qty 1

## 2020-05-21 NOTE — BHH Group Notes (Signed)
Psychoeducational Group Note    Date:05/21/2020 Time: 1300-1400    Life Skills:  A group where two lists are made. What people need and what are things that we do that are healthy. The lists are developed by the patients and it is explained that we often do the actions that are not healthy to get our list of needs met.   Purpose of Group: . The group focus' on teaching patients on how to identify their needs and how to develop the coping skills needed to get their needs met  Participation Level:  Active  Participation Quality:  Appropriate  Affect:  Appropriate  Cognitive:  Oriented  Insight:  Improving  Engagement in Group:  Engaged  Additional Comments:  Attended the group. Rates his energy at a 5/10. Participated in the forming of the two groups of needs and things that we do that are not healthy. Pt stated at the end that he has learned he has control over how he reacts to his emottions Brandon Miller

## 2020-05-21 NOTE — Progress Notes (Signed)
[place] Group Notes:  (Nursing/MHT/Case Management/Adjunct)  Date:  05/21/2020  Time:  2030 Type of Therapy:  wrap up group  Participation Level:  Active  Participation Quality:  Appropriate, Attentive, Sharing and Supportive  Affect:  Flat  Cognitive:  Alert  Insight:  Improving  Engagement in Group:  Engaged  Modes of Intervention:  Clarification, Education and Support  Summary of Progress/Problems: Positive thinking and self-care were discussed.    Shellia Cleverly 05/21/2020, 10:32 PM

## 2020-05-21 NOTE — BHH Group Notes (Signed)
.  Psychoeducational Group Note  Date: 05/21/2020 Time: 0900-1000    Goal Setting   Purpose of Group: This group helps to provide patients with the steps of setting a goal that is specific, measurable, attainable, realistic and time specific. A discussion on how we keep ourselves stuck with negative self talk.    Participation Level:  Active  Participation Quality:  Appropriate  Affect:  Appropriate  Cognitive:  Appropriate  Insight:  Improving  Engagement in Group:  Engaged  Additional Comments:  Rates his energy at a 3/10. States he is here due to his drinking. Affect is flat and mood depressed,  Was attentive throughout the group.  Brandon [facility]

## 2020-05-21 NOTE — Progress Notes (Signed)
[facility] MD Progress Note  05/21/2020 7:29 AM Brandon Miller  MRN:  [id number]   Chief Complaint: suicidal ideation, depression, and alcohol abuse  Subjective: Brandon Miller is a 55 y.o. male with a history of alcohol use d/o, alcohol-induced mood d/o, ADHD, h/o seizures, and TBI s/p subdural hematoma, who was initially admitted for inpatient psychiatric hospitalization on 05/17/2020 for management of SI, worsening depression, and alcohol abuse. The patient is currently on Hospital Day 4.   Chart Review from last 24 hours:  The patient's chart was reviewed and nursing notes were reviewed. The patient's case was discussed in multidisciplinary team meeting. Per nursing patient has requested Ativan but has not scored on CIWA scale to receive medication. No behavioral or safety issues noted. Per MAR, he was compliant with scheduled medications and did receive Trazodone X1 for sleep and Vistaril X1 for anxiety. Per Recent CIWA scores: 2,5,2  Information Obtained Today During Patient Interview: The patient was seen and evaluated on the unit. Patient reports poor sleep overnight but reports stable appetite. He was challenged on the amount of time he has been sleeping during the day and admits he feels bored on the unit. He denies SI, HI, AVH, paranoia, or delusions. He denies craving for alcohol and states his withdrawal symptoms are resolving. He is still interested in residential rehab and we discussed the hope that a bed will be open early next week. He voices no physical complaints. He states he is frustrated but overall his mood is improving.  Principal Problem: Depression, unspecified Diagnosis: Principal Problem:   Depression, unspecified Active Problems:   Diabetes mellitus, type 2 (HCC)   Elevated LFTs   Alcohol use disorder, severe, dependence (HCC)   GERD (gastroesophageal reflux disease)   Substance induced mood disorder ([place])   History of seizures  Total Time Spent in Direct Patient  Care:  I personally spent 30 minutes on the unit in direct patient care. The direct patient care time included face-to-face time with the patient, reviewing the patient's chart, communicating with other professionals, and coordinating care. Greater than 50% of this time was spent in counseling or coordinating care with the patient regarding goals of hospitalization, psycho-education, and discharge planning needs.  Past Psychiatric History: see admission H&P  Past Medical History:  Past Medical History:  Diagnosis Date  . Allergy   . Anemia   . Anxiety   . Arthritis    RA  . Complication of anesthesia    hard to wake up-was told he may have sleep apnea-never tested  . Depression   . Diabetes mellitus without complication ([place])    with weight no more problems  . Seizure ([place])    had one seizure of unknown etiology a few tears ago, none since and on no meds for them  . Sleep apnea   . Snores   h/o subdural hematoma 12/2019  Past Surgical History:  Procedure Laterality Date  . BIOPSY  02/12/2019   Procedure: BIOPSY;  Surgeon: Daneil Dolin, MD;  [facility];  Service: Endoscopy;;  stomach  . COLONOSCOPY WITH PROPOFOL N/A 02/12/2019   Procedure: COLONOSCOPY WITH PROPOFOL;  Surgeon: Daneil Dolin, MD;  [facility];  Service: Endoscopy;  Laterality: N/A;  10:30am  . ELBOW SURGERY Right 2010   Dr. Percell Miller  . ESOPHAGOGASTRODUODENOSCOPY (EGD) WITH PROPOFOL N/A 02/12/2019   Procedure: ESOPHAGOGASTRODUODENOSCOPY (EGD) WITH PROPOFOL;  Surgeon: Daneil Dolin, MD;  [facility];  Service: Endoscopy;  Laterality:  N/A;  . HERNIA REPAIR     Right inguinal  . KNEE ARTHROSCOPY WITH MEDIAL MENISECTOMY Right 06/11/2013   Procedure: RIGHT KNEE ARTHROSCOPY WITH MEDIAL MENISECTOMY AND CHONDROPLASTY;  Surgeon: Ninetta Lights, MD;  Location: [place];  Service: Orthopedics;  Laterality: Right;  Chondroplasty, loose bodies, medial menisectomy, lysis of  adhesions.  Marland Kitchen KNEE SURGERY Right 2011  . POLYPECTOMY  02/12/2019   Procedure: POLYPECTOMY;  Surgeon: Daneil Dolin, MD;  [facility];  Service: Endoscopy;;  . right ankle    . SHOULDER SURGERY Right 2010   Dr. Percell Miller  . TOTAL KNEE ARTHROPLASTY Right 08/25/2014   Procedure: TOTAL KNEE ARTHROPLASTY;  Surgeon: Ninetta Lights, MD;  Location: [place];  Service: Orthopedics;  Laterality: Right;   Family History:  Family History  Problem Relation Age of Onset  . Hepatitis Mother   . Cirrhosis Mother        Did not drink  . Pneumonia Father   . Kidney failure Father   . Gastric cancer Brother        ? Gastric CA spread to esophagus  . Colon cancer Neg Hx    Family Psychiatric  History: see admission H&P  Social History:  Social History   Substance and Sexual Activity  Alcohol Use Yes   Comment: 10-20/day cans beer     Social History   Substance and Sexual Activity  Drug Use No    Social History   Socioeconomic History  . Marital status: Single    Spouse name: Not on file  . Number of children: Not on file  . Years of education: Not on file  . Highest education level: Not on file  Occupational History  . Occupation: unemployed  Tobacco Use  . Smoking status: Current Every Day Smoker    Packs/day: 1.50    Years: 25.00    Pack years: 37.50    Types: Cigarettes  . Smokeless tobacco: Never Used  Vaping Use  . Vaping Use: Never used  Substance and Sexual Activity  . Alcohol use: Yes    Comment: 10-20/day cans beer  . Drug use: No  . Sexual activity: Not Currently  Other Topics Concern  . Not on file  Social History Narrative  . Not on file   Social Determinants of Health   Financial Resource Strain: Not on file  Food Insecurity: Not on file  Transportation Needs: Not on file  Physical Activity: Not on file  Stress: Not on file  Social Connections: Not on file   Sleep: Improving  Appetite:  Improved  Current Medications: Current  Facility-Administered Medications  Medication Dose Route Frequency Provider Last Rate Last Admin  . alum & mag hydroxide-simeth (MAALOX/MYLANTA) 200-200-20 MG/5ML suspension 30 mL  30 mL Oral Q4H PRN Sharma Covert, MD      . feeding supplement (ENSURE ENLIVE / ENSURE PLUS) liquid 237 mL  237 mL Oral BID BM Sharma Covert, MD   237 mL at 05/20/20 1511  . FLUoxetine (PROZAC) capsule 20 mg  20 mg Oral Daily Sharma Covert, MD   20 mg at 05/20/20 0738  . folic acid (FOLVITE) tablet 1 mg  1 mg Oral Daily Sharma Covert, MD   1 mg at 05/20/20 [phone]  . gabapentin (NEURONTIN) capsule 200 mg  200 mg Oral Daily Shanavia Makela E, MD      . hydrOXYzine (ATARAX/VISTARIL) tablet 25 mg  25 mg Oral Q6H PRN Nelda Marseille, Shaneta Cervenka E,  MD   25 mg at 05/20/20 1817  . levETIRAcetam (KEPPRA) tablet 500 mg  500 mg Oral BID Sharma Covert, MD   500 mg at 05/20/20 1645  . levothyroxine (SYNTHROID) tablet 25 mcg  25 mcg Oral Q0600 Harlow Asa, MD   25 mcg at 05/21/20 0620  . loperamide (IMODIUM) capsule 2-4 mg  2-4 mg Oral PRN Nelda Marseille, Skyrah Krupp E, MD      . LORazepam (ATIVAN) tablet 1 mg  1 mg Oral Q6H PRN Nelda Marseille, Ayiana Winslett E, MD      . magnesium hydroxide (MILK OF MAGNESIA) suspension 30 mL  30 mL Oral Daily PRN Sharma Covert, MD      . multivitamin with minerals tablet 1 tablet  1 tablet Oral Daily Sharma Covert, MD   1 tablet at 05/20/20 [phone]  . nicotine (NICODERM CQ - dosed in mg/24 hours) patch 21 mg  21 mg Transdermal Daily Nelda Marseille, Sundi Slevin E, MD   21 mg at 05/20/20 1511  . ondansetron (ZOFRAN-ODT) disintegrating tablet 4 mg  4 mg Oral Q6H PRN Nelda Marseille, Eryka Dolinger E, MD      . pantoprazole (PROTONIX) EC tablet 40 mg  40 mg Oral Daily Sharma Covert, MD   40 mg at 05/20/20 0738  . thiamine tablet 100 mg  100 mg Oral Daily Sharma Covert, MD   100 mg at 05/20/20 2094  . traMADol (ULTRAM) tablet 50 mg  50 mg Oral Q12H PRN Nelda Marseille, Brittin Belnap E, MD      . traZODone (DESYREL) tablet 50 mg  50 mg Oral QHS  PRN Sharma Covert, MD   50 mg at 05/20/20 2118    Lab Results:  Results for orders placed or performed during the hospital encounter of 05/17/20 (from the past 48 hour(s))  T4, free     Status: Abnormal   Collection Time: 05/19/20  6:28 PM  Result Value Ref Range   Free T4 0.59 (L) 0.61 - 1.12 ng/dL    Comment: (NOTE) Biotin ingestion may interfere with free T4 tests. If the results are inconsistent with the TSH level, previous test results, or the clinical presentation, then consider biotin interference. If needed, order repeat testing after stopping biotin. Performed at [facility], [place] [address] [place], [place] [address]     Blood Alcohol level:  Lab Results  Component Value Date   ETH 188 (H) 05/17/2020   ETH 129 (H) 83/66/2947    Metabolic Disorder Labs: Lab Results  Component Value Date   HGBA1C 4.6 (L) 05/19/2020   MPG 85.32 05/19/2020   MPG 131 08/25/2014   No results found for: PROLACTIN No results found for: CHOL, TRIG, HDL, CHOLHDL, VLDL, LDLCALC  Physical Findings: AIMS: Facial and Oral Movements Muscles of Facial Expression: None, normal Lips and Perioral Area: None, normal Jaw: None, normal Tongue: None, normal,Extremity Movements Upper (arms, wrists, hands, fingers): None, normal Lower (legs, knees, ankles, toes): None, normal, Trunk Movements Neck, shoulders, hips: None, normal, Overall Severity Severity of abnormal movements (highest score from questions above): None, normal Incapacitation due to abnormal movements: None, normal Patient's awareness of abnormal movements (rate only patient's report): No Awareness, Dental Status Current problems with teeth and/or dentures?: No Does patient usually wear dentures?: No  CIWA:  CIWA-Ar Total: 2     Musculoskeletal: Strength & Muscle Tone: within normal limits Gait & Station: steady, normal Patient leans: N/A  Psychiatric Specialty Exam: Physical Exam Vitals reviewed.  HENT:  Head: Normocephalic.  Pulmonary:     Effort: Pulmonary effort is normal.  Skin:    Comments: No obvious signs of jaundice  Neurological:     Mental Status: He is alert.     Comments: No asterixis      Review of Systems  Constitutional: Positive for fatigue. Negative for chills.  Respiratory: Negative for shortness of breath.   Cardiovascular: Negative for chest pain.  Gastrointestinal: Negative for abdominal pain, diarrhea, nausea and vomiting.  Neurological: Positive for tremors. Negative for headaches.    Blood pressure 104/69, pulse 70, temperature 97.7 F (36.5 C), temperature source Oral, resp. rate 16, height 6' 2"  (1.88 m), weight 80.3 kg, SpO2 99 %.Body mass index is 22.73 kg/m.  General Appearance: casually dressed, improved hygiene - wearing clean clothes  Eye Contact:  Good  Speech:  Clear and Coherent and Normal Rate  Volume:  Normal  Mood:  frustrated  Affect:  Mildly irritable  Thought Process:  Goal Directed, linear  Orientation:  Oriented grossly to time, place, situation  Thought Content:  Logical and denies AVH, paranoia, or delusions; no acute psychosis on exam  Suicidal Thoughts:  No  Homicidal Thoughts:  No  Memory:  Recent;   Fair  Judgement:  Fair  Insight:  Fair  Psychomotor Activity:  Normal  Concentration:  Concentration: Good and Attention Span: Good  Recall:  [employer] of Knowledge:  Fair  Language:  Good  Akathisia:  Negative  Assets:  Communication Skills Desire for Improvement Resilience  ADL's:  Intact  Cognition:  WNL  Sleep:  Number of Hours: 6.5   Treatment Plan Summary: Diagnoses / Active Problems: Unspecified depressive d/o (r/o Substance induced depressive d/o) Alcohol use d/o severe GERD by hx Type II DM by hx ADHD by hx  H/o seizures and TBI s/p subdural hematoma Elevated LFTs Hypothyroidism  PLAN: 1. Safety and Monitoring:  -- Voluntary admission to inpatient psychiatric unit for safety, stabilization and  treatment  -- Daily contact with patient to assess and evaluate symptoms and progress in treatment  -- Patient's case to be discussed in multi-disciplinary team meeting  -- Observation Level : q15 minute checks  -- Vital signs:  q12 hours  -- Precautions: suicide  2. Psychiatric Diagnoses and Treatment:   Unspecified depressive d/o (r/o substance induced depressive d/o)  -- Continue Prozac 76m daily for depressive symptoms   -- Tapering off Neurontin given recommendations from IM in context of his elevated LFTs and due to daytime sedation - reduced to 2039mtid and now to 10062mid and then stop  -- Continue PRN Vistaril for anxiety  -- Encouraged patient to participate in unit milieu and in scheduled group therapies   -- Short Term Goals: Ability to identify changes in lifestyle to reduce recurrence of condition will improve and Ability to identify and develop effective coping behaviors will improve  -- Long Term Goals: Improvement in symptoms so as ready for discharge   Alcohol use d/o severe  -- UDS positive for amphetamines and ETOH 188 on admission  -- Continue CIWA protocol with Ativan 1mg64mr CIWA scores >10 and oral thiamine and folate replacement  -- Tapering off Neurontin given recommendations from IM in context of his elevated LFTs and due to daytime sedation; he will continue Keppra d/t seizure Hx  -- Patient interested in residential rehab after discharge  -- Discussed the need to abstain from benzodiazepine and stimulant use after discharge given addictions history and discussed risk of respiratory  suppression and death from mixing alcohol and benzodiazepines   -- Short Term Goals: Ability to identify triggers associated with substance abuse/mental health issues will improve  -- Long Term Goals: Improvement in symptoms so as ready for discharge   3. Medical Issues Being Addressed:   GERD   -- Continue Protonix 40m daily   Type II DM  -- Not on home medications per  records  -- HbgA1c 4.6   Elevated LFTs   -- 05/19/20: AST 185 and ALT 138; alk phos 160 and total bili 2.0, direct bili 0.4, indirect bili 1.6 - will repeat Sunday for trending  -- Holding Tylenol  -- Hepatitis panel nonreactive and HIV nonreactive  -- spoke with Dr. KMarylyn Ishiharawith internal medicine and he recommends discontinuing Neurontin if not necessary and to continue [place] now d/t seizure Hx    Hypokalemia (K+ 3.0) - resolved  -- 05/19/20: K+ 3.8 and Mag+2.0 (remainder of BMP WNL)  -- Improved po intake encouraged    Thrombocytopenia (platelets 126)  -- appears chronically low when compared to previous labs - 86 9 months ago, 107 1 year ago)  -- Will need PCP f/u after discharge   H/o presumed alcohol withdrawal seizures with h/o TBI/subdural hematoma in 2021  -- Continue Keppra 5097mbid at this time   -- Tapering off Neurontin given recommendations from IM in context of his elevated LFTs and due to daytime sedation  -- Will need outpatient neurology f/u after discharge for additional management  -- On seizure precautions    Left foot pain  -- Radiographs negative for fracture  -- Has Ultram PRN for severe pain given NSAID allergy   Hypothyroidism  -- Checking T3U and FreeT4 low at 0.59 and TSH elevated at 5.479  -- per recommendations from IM will start low dose synthroid 254mwith plans to recheck his thyroid labs in 4-6 weeks  -- will refer to PCP for recheck after discharge  4. Discharge Planning:   -- Social work and case management to assist with discharge planning and identification of hospital follow-up needs prior to discharge  -- Estimated LOS: 3-4 days  -- Discharge Concerns: Need to establish a safety plan; Medication compliance and effectiveness  -- Discharge Goals: Return home with outpatient referrals for mental health follow-up including medication management/psychotherapy  AmyHarlow AsaD, FAPA 05/21/2020, 7:29 AM

## 2020-05-21 NOTE — Progress Notes (Signed)
   05/21/20 0616  Vital Signs  Temp 97.7 F (36.5 C)  Temp Source Oral  Pulse Rate (!) 55  Pulse Rate Source Monitor  BP 105/63  BP Location Left Arm  BP Method Automatic  Patient Position (if appropriate) Sitting  Oxygen Therapy  SpO2 99 %   D: Patient denies SI/HI/AVH. Patient rated anxiety 6/10 and depression 5/10. Pt. Was out in open areas. A:  Patient took scheduled medicine. Pt.'s anxiety was relieved 25 mg of Vistaril. Support and encouragement provided Routine safety checks conducted every 15 minutes. Patient  Informed to notify staff with any concerns.  R:  Safety maintained.

## 2020-05-21 NOTE — Progress Notes (Addendum)
Pt visible on the dayroom, pt stated he felt better. Pt given PRN Vistaril and Trazodone per [facility]    05/21/20 2100  Psych Admission Type (Psych Patients Only)  Admission Status Voluntary  Psychosocial Assessment  Patient Complaints Anxiety;Depression  Eye Contact Fair  Facial Expression Anxious;Pensive;Sullen;Sad  Affect Anxious;Sad;Sullen  Speech Logical/coherent  Interaction Assertive  Motor Activity Fidgety  Appearance/Hygiene Unremarkable  Behavior Characteristics Cooperative  Mood Anxious;Depressed  Thought Process  Coherency Concrete thinking  Content WDL  Delusions None reported or observed  Perception WDL  Hallucination None reported or observed  Judgment Poor  Confusion None  Danger to Self  Current suicidal ideation? Denies  Self-Injurious Behavior No self-injurious ideation or behavior indicators observed or expressed   Danger to Others  Danger to Others None reported or observed

## 2020-05-21 NOTE — Progress Notes (Addendum)
Pt continues to request Ativan, pt educated that he does not score high enough on his CIWA to get the Ativan . Pt stated he was having rough time going to sleep, pt said he takes Klonopin at home to help him sleep, pt informed to talk to the doctor about adjusting sleep medication. Pt received PRN Trazodone per [facility]    05/21/20 0000  Psych Admission Type (Psych Patients Only)  Admission Status Voluntary  Psychosocial Assessment  Patient Complaints Anxiety  Eye Contact Fair  Facial Expression Anxious;Pensive;Sullen;Sad  Affect Anxious;Sad;Sullen  Speech Logical/coherent  Interaction Assertive  Motor Activity Fidgety  Appearance/Hygiene Unremarkable  Behavior Characteristics Cooperative  Mood Anxious;Depressed  Thought Process  Coherency Concrete thinking  Content Blaming self  Delusions None reported or observed  Perception WDL  Hallucination None reported or observed  Judgment Poor  Confusion None  Danger to Self  Current suicidal ideation? Denies  Self-Injurious Behavior No self-injurious ideation or behavior indicators observed or expressed   Danger to Others  Danger to Others None reported or observed

## 2020-05-21 NOTE — Progress Notes (Signed)
   05/21/20 0500  Sleep  Number of Hours 6.5

## 2020-05-22 LAB — HEPATIC FUNCTION PANEL
ALT: 82 U/L — ABNORMAL HIGH (ref 0–44)
AST: 87 U/L — ABNORMAL HIGH (ref 15–41)
Albumin: 4.2 g/dL (ref 3.5–5.0)
Alkaline Phosphatase: 163 U/L — ABNORMAL HIGH (ref 38–126)
Bilirubin, Direct: 0.1 mg/dL (ref 0.0–0.2)
Indirect Bilirubin: 0.8 mg/dL (ref 0.3–0.9)
Total Bilirubin: 0.9 mg/dL (ref 0.3–1.2)
Total Protein: 7.8 g/dL (ref 6.5–8.1)

## 2020-05-22 MED ORDER — TRAZODONE HCL 100 MG PO TABS
100.0000 mg | ORAL_TABLET | Freq: Every evening | ORAL | Status: DC | PRN
  Administered 2020-05-22: 100 mg via ORAL
  Filled 2020-05-22: qty 1

## 2020-05-22 MED ORDER — IBUPROFEN 600 MG PO TABS
ORAL_TABLET | ORAL | Status: AC
  Filled 2020-05-22: qty 1

## 2020-05-22 MED ORDER — IBUPROFEN 600 MG PO TABS
600.0000 mg | ORAL_TABLET | Freq: Four times a day (QID) | ORAL | Status: DC | PRN
  Administered 2020-05-22: 600 mg via ORAL

## 2020-05-22 MED ORDER — NICOTINE POLACRILEX 2 MG MT GUM
2.0000 mg | CHEWING_GUM | OROMUCOSAL | Status: DC | PRN
  Administered 2020-05-22 – 2020-05-23 (×3): 2 mg via ORAL

## 2020-05-22 NOTE — Progress Notes (Signed)
   05/22/20 0500  Sleep  Number of Hours 4.5

## 2020-05-22 NOTE — Progress Notes (Signed)
Pt lost his Nicotine patch at the gym, pt requested to change to the gum, per NP-Cody pt has been changed to the nicotine gum and nicotine patch D/C

## 2020-05-22 NOTE — [facility] Group Notes (Signed)
Adult Psychoeducational Group Not Date:  05/22/2020 Time:  0900-1045 Group Topic/Focus: PROGRESSIVE RELAXATION. A group where deep breathing is taught and tensing and relaxation muscle groups is used. Imagery is used as well.  Pts are asked to imagine 3 pillars that hold them up when they are not able to hold themselves up.  Participation Level:  Active  Participation Quality:  Appropriate  Affect:  Appropriate  Cognitive:  Oriented  Insight: Improving  Engagement in Group:  Engaged  Modes of Intervention:  Activity, Discussion, Education, and Support  Additional Comments:  Pt rates his energy at a 9, states that his family, faith and his friends hold him up.  Brandon Miller

## 2020-05-22 NOTE — Progress Notes (Signed)
   05/22/20 0615  Vital Signs  Temp (!) 97.4 F (36.3 C)  Temp Source Oral  Pulse Rate (!) 47  Resp 18  BP 104/66  BP Location Left Arm  BP Method Automatic  Patient Position (if appropriate) Sitting   D: Patient denies SI/HI/AVH. Patient rated anxiety 5/10 and depression 3/10. Pt. Out in open areas and attended groups.  A:  Patient took scheduled medicine.  Support and encouragement provided Routine safety checks conducted every 15 minutes. Patient  Informed to notify staff with any concerns.   R: Safety maintained.

## 2020-05-22 NOTE — Progress Notes (Signed)
Notified by nursing staff that patient had mentioned that he was experiencing left foot pain.  Spoke with the patient myself. Patient states that a few weeks ago, he injured his left foot by dropping a grate on the top of his left foot.  Patient states that he was sent from [facility] recently to have an x-ray done of his left foot, which showed no sign of fracture at that time.  Per chart review, patient had 2 view x-ray of his left foot conducted on 05/17/20, which showed "there is no evidence of fracture or dislocation", "no fracture or subluxation of the left foot", and "soft tissues are unremarkable".   Examined the patient's left foot myself.  Diffuse swelling noted on the dorsal surface of patient's left foot as well as slight swelling noted on digits of patient's left foot.  No signs of injury or swelling noted on plantar surface of the patient's left foot.  Patient has full active and passive range of motion of dorsiflexion, plantar flexion, inversion, and eversion of his left foot.  Patient also has full active and passive range of motion of flexion and extension of all digits of his left foot.  Strength for dorsiflexion, plantar flexion, inversion, and eversion of the left foot noted to be 10 out of 10.  Strength for flexion and extension of all digits of patient's left foot noted to be 10 out of 10.  Patient did note slight pain with dorsiflexion and plantar flexion of his left foot against resistance.  Patient not currently prescribed Tylenol, most likely due to patient's LFTs being elevated on most recent CMP that was drawn on 05/17/2020, which showed AST of 141 U/L and ALT of 65 U/L.  Due to the elevated LFTs, will not order Tylenol at this time.  Spoke with the patient about initiating ibuprofen.  Patient states that he takes ibuprofen at home frequently with no side effects and he agreed to take ibuprofen.  Ibuprofen 600 mg p.o. every 6 hours as needed ordered for left foot pain/left foot swelling.  Also  recommended that patient ice his left foot intermittently throughout the night with elevation of his left foot with 1 or 2 extra pillows to help reduce the left foot swelling.  Patient agreed to this as well.  Nursing to provide patient with ice pack. Patient to notify nursing if any additional concerns arise.

## 2020-05-22 NOTE — Progress Notes (Signed)
[facility] MD Progress Note  05/22/2020 11:29 AM Brandon Miller  MRN:  [id number]   Chief Complaint: suicidal ideation, depression, and alcohol abuse  Subjective: Brandon Miller is a 55 y.o. male with a history of alcohol use d/o, alcohol-induced mood d/o, ADHD, h/o seizures, and TBI s/p subdural hematoma, who was initially admitted for inpatient psychiatric hospitalization on 05/17/2020 for management of SI, worsening depression, and alcohol abuse. The patient is currently on Hospital Day 5.   Chart Review from last 24 hours:  The patient's chart was reviewed and nursing notes were reviewed. The patient's case was discussed in multidisciplinary team meeting. Per nursing he attended groups and was visible in the milieu. He only slept 4.25 hours. Recent CIWA scores: 1, 3,2. Per [facility] he was complaint with scheduled medications and did receive Vistaril X2 for anxiety and Trazodone X1 for sleep.  Information Obtained Today During Patient Interview: The patient was seen and evaluated on the unit. He states he is frustrated that he cannot go outside and "get fresh air." He was redirected to instead focus on the reason for his admission and on future goals after discharge. He admits he is getting antsy waiting for a rehab bed to open. I advised that he has reportedly been accepted at [facility] residential and I hope that bed availability will be early this week. He states he did not sleep well last night but he reports good appetite. He denies current signs of acute withdrawal and specifically denies issues with sweats, tremors, GI sx, HA, or cravings. He denies SI or HI and denies AVH or paranoia. I advised that his LFTs appear to be trending down and he understands he will need PCP follow up of liver enzymes after discharge. Time was given for questions.   Principal Problem: Depression, unspecified Diagnosis: Principal Problem:   Depression, unspecified Active Problems:   Diabetes mellitus, type 2  (HCC)   Elevated LFTs   Alcohol use disorder, severe, dependence (HCC)   GERD (gastroesophageal reflux disease)   Substance induced mood disorder ([place])   History of seizures  Total Time Spent in Direct Patient Care:  I personally spent 30 minutes on the unit in direct patient care. The direct patient care time included face-to-face time with the patient, reviewing the patient's chart, communicating with other professionals, and coordinating care. Greater than 50% of this time was spent in counseling or coordinating care with the patient regarding goals of hospitalization, psycho-education, and discharge planning needs.  Past Psychiatric History: see admission H&P  Past Medical History:  Past Medical History:  Diagnosis Date  . Allergy   . Anemia   . Anxiety   . Arthritis    RA  . Complication of anesthesia    hard to wake up-was told he may have sleep apnea-never tested  . Depression   . Diabetes mellitus without complication ([place])    with weight no more problems  . Seizure ([place])    had one seizure of unknown etiology a few tears ago, none since and on no meds for them  . Sleep apnea   . Snores   h/o subdural hematoma 12/2019  Past Surgical History:  Procedure Laterality Date  . BIOPSY  02/12/2019   Procedure: BIOPSY;  Surgeon: Daneil Dolin, MD;  [facility];  Service: Endoscopy;;  stomach  . COLONOSCOPY WITH PROPOFOL N/A 02/12/2019   Procedure: COLONOSCOPY WITH PROPOFOL;  Surgeon: Daneil Dolin, MD;  [facility];  Service:  Endoscopy;  Laterality: N/A;  10:30am  . ELBOW SURGERY Right 2010   Dr. Percell Miller  . ESOPHAGOGASTRODUODENOSCOPY (EGD) WITH PROPOFOL N/A 02/12/2019   Procedure: ESOPHAGOGASTRODUODENOSCOPY (EGD) WITH PROPOFOL;  Surgeon: Daneil Dolin, MD;  [facility];  Service: Endoscopy;  Laterality: N/A;  . HERNIA REPAIR     Right inguinal  . KNEE ARTHROSCOPY WITH MEDIAL MENISECTOMY Right 06/11/2013   Procedure: RIGHT KNEE  ARTHROSCOPY WITH MEDIAL MENISECTOMY AND CHONDROPLASTY;  Surgeon: Ninetta Lights, MD;  Location: [place];  Service: Orthopedics;  Laterality: Right;  Chondroplasty, loose bodies, medial menisectomy, lysis of adhesions.  Marland Kitchen KNEE SURGERY Right 2011  . POLYPECTOMY  02/12/2019   Procedure: POLYPECTOMY;  Surgeon: Daneil Dolin, MD;  [facility];  Service: Endoscopy;;  . right ankle    . SHOULDER SURGERY Right 2010   Dr. Percell Miller  . TOTAL KNEE ARTHROPLASTY Right 08/25/2014   Procedure: TOTAL KNEE ARTHROPLASTY;  Surgeon: Ninetta Lights, MD;  Location: [place];  Service: Orthopedics;  Laterality: Right;   Family History:  Family History  Problem Relation Age of Onset  . Hepatitis Mother   . Cirrhosis Mother        Did not drink  . Pneumonia Father   . Kidney failure Father   . Gastric cancer Brother        ? Gastric CA spread to esophagus  . Colon cancer Neg Hx    Family Psychiatric  History: see admission H&P  Social History:  Social History   Substance and Sexual Activity  Alcohol Use Yes   Comment: 10-20/day cans beer     Social History   Substance and Sexual Activity  Drug Use No    Social History   Socioeconomic History  . Marital status: Single    Spouse name: Not on file  . Number of children: Not on file  . Years of education: Not on file  . Highest education level: Not on file  Occupational History  . Occupation: unemployed  Tobacco Use  . Smoking status: Current Every Day Smoker    Packs/day: 1.50    Years: 25.00    Pack years: 37.50    Types: Cigarettes  . Smokeless tobacco: Never Used  Vaping Use  . Vaping Use: Never used  Substance and Sexual Activity  . Alcohol use: Yes    Comment: 10-20/day cans beer  . Drug use: No  . Sexual activity: Not Currently  Other Topics Concern  . Not on file  Social History Narrative  . Not on file   Social Determinants of Health   Financial Resource Strain: Not on file  Food Insecurity:  Not on file  Transportation Needs: Not on file  Physical Activity: Not on file  Stress: Not on file  Social Connections: Not on file   Sleep: Poor  Appetite:  Good  Current Medications: Current Facility-Administered Medications  Medication Dose Route Frequency Provider Last Rate Last Admin  . alum & mag hydroxide-simeth (MAALOX/MYLANTA) 200-200-20 MG/5ML suspension 30 mL  30 mL Oral Q4H PRN Sharma Covert, MD      . feeding supplement (ENSURE ENLIVE / ENSURE PLUS) liquid 237 mL  237 mL Oral BID BM Sharma Covert, MD   237 mL at 05/21/20 1253  . FLUoxetine (PROZAC) capsule 20 mg  20 mg Oral Daily Sharma Covert, MD   20 mg at 05/22/20 5364  . folic acid (FOLVITE) tablet 1 mg  1 mg Oral Daily  Sharma Covert, MD   1 mg at 05/22/20 5859  . hydrOXYzine (ATARAX/VISTARIL) tablet 25 mg  25 mg Oral Q6H PRN Harlow Asa, MD   25 mg at 05/21/20 2134  . levETIRAcetam (KEPPRA) tablet 500 mg  500 mg Oral BID Sharma Covert, MD   500 mg at 05/22/20 2924  . levothyroxine (SYNTHROID) tablet 25 mcg  25 mcg Oral Q0600 Harlow Asa, MD   25 mcg at 05/22/20 0617  . loperamide (IMODIUM) capsule 2-4 mg  2-4 mg Oral PRN Nelda Marseille, Kyarra Vancamp E, MD      . LORazepam (ATIVAN) tablet 1 mg  1 mg Oral Q6H PRN Nelda Marseille, Calie Buttrey E, MD      . magnesium hydroxide (MILK OF MAGNESIA) suspension 30 mL  30 mL Oral Daily PRN Sharma Covert, MD      . multivitamin with minerals tablet 1 tablet  1 tablet Oral Daily Sharma Covert, MD   1 tablet at 05/22/20 4628  . nicotine polacrilex (NICORETTE) gum 2 mg  2 mg Oral PRN Prescilla Sours, PA-C   2 mg at 05/22/20 6381  . ondansetron (ZOFRAN-ODT) disintegrating tablet 4 mg  4 mg Oral Q6H PRN Nelda Marseille, Rafay Dahan E, MD      . pantoprazole (PROTONIX) EC tablet 40 mg  40 mg Oral Daily Sharma Covert, MD   40 mg at 05/22/20 7711  . thiamine tablet 100 mg  100 mg Oral Daily Sharma Covert, MD   100 mg at 05/22/20 6579  . traMADol (ULTRAM) tablet 50 mg  50 mg  Oral Q12H PRN Harlow Asa, MD      . traZODone (DESYREL) tablet 50 mg  50 mg Oral QHS PRN Sharma Covert, MD   50 mg at 05/21/20 2134    Lab Results:  Results for orders placed or performed during the hospital encounter of 05/17/20 (from the past 48 hour(s))  Hepatic function panel     Status: Abnormal   Collection Time: 05/22/20  6:38 AM  Result Value Ref Range   Total Protein 7.8 6.5 - 8.1 g/dL   Albumin 4.2 3.5 - 5.0 g/dL   AST 87 (H) 15 - 41 U/L   ALT 82 (H) 0 - 44 U/L   Alkaline Phosphatase 163 (H) 38 - 126 U/L   Total Bilirubin 0.9 0.3 - 1.2 mg/dL   Bilirubin, Direct 0.1 0.0 - 0.2 mg/dL   Indirect Bilirubin 0.8 0.3 - 0.9 mg/dL    Comment: Performed at [facility], [place] [address], [place], [place] [address]    Blood Alcohol level:  Lab Results  Component Value Date   ETH 188 (H) 05/17/2020   ETH 129 (H) 38/32/9191    Metabolic Disorder Labs: Lab Results  Component Value Date   HGBA1C 4.6 (L) 05/19/2020   MPG 85.32 05/19/2020   MPG 131 08/25/2014   No results found for: PROLACTIN No results found for: CHOL, TRIG, HDL, CHOLHDL, VLDL, LDLCALC  Physical Findings: AIMS: Facial and Oral Movements Muscles of Facial Expression: None, normal Lips and Perioral Area: None, normal Jaw: None, normal Tongue: None, normal,Extremity Movements Upper (arms, wrists, hands, fingers): None, normal Lower (legs, knees, ankles, toes): None, normal, Trunk Movements Neck, shoulders, hips: None, normal, Overall Severity Severity of abnormal movements (highest score from questions above): None, normal Incapacitation due to abnormal movements: None, normal Patient's awareness of abnormal movements (rate only patient's report): No Awareness, Dental Status Current problems with teeth and/or dentures?:  No Does patient usually wear dentures?: No  CIWA:  CIWA-Ar Total: 2     Musculoskeletal: Strength & Muscle Tone: within normal limits Gait & Station: steady,  normal Patient leans: N/A  Psychiatric Specialty Exam: Physical Exam Vitals reviewed.  HENT:     Head: Normocephalic.  Pulmonary:     Effort: Pulmonary effort is normal.  Skin:    Comments: No obvious signs of jaundice  Neurological:     Mental Status: He is alert.     Review of Systems  Constitutional: Negative for chills.  Respiratory: Negative for shortness of breath.   Cardiovascular: Negative for chest pain.  Gastrointestinal: Negative for abdominal pain, diarrhea, nausea and vomiting.  Neurological: Negative for tremors and headaches.    Blood pressure 99/61, pulse 68, temperature (!) 97.4 F (36.3 C), temperature source Oral, resp. rate 18, height 6' 2"  (1.88 m), weight 80.3 kg, SpO2 99 %.Body mass index is 22.73 kg/m.  General Appearance: casually dressed, adequate hygiene  Eye Contact:  Good  Speech:  Clear and Coherent and Normal Rate  Volume:  Normal  Mood:  Frustrated  Affect:  Mildly irritable, constricted  Thought Process:  Goal Directed, linear  Orientation:  Oriented grossly to time, place, situation  Thought Content:  Logical and denies AVH, paranoia, or delusions; no acute psychosis on exam  Suicidal Thoughts:  No  Homicidal Thoughts:  No  Memory:  Recent;   Fair  Judgement:  Fair  Insight:  Fair  Psychomotor Activity:  Normal  Concentration:  Concentration: Good and Attention Span: Good  Recall:  [employer] of Knowledge:  Fair  Language:  Good  Akathisia:  Negative  Assets:  Communication Skills Desire for Improvement Resilience  ADL's:  Intact  Cognition:  WNL  Sleep:  Number of Hours: 4.5   Treatment Plan Summary: Diagnoses / Active Problems: Unspecified depressive d/o (r/o Substance induced depressive d/o) Alcohol use d/o severe GERD by hx Type II DM by hx ADHD by hx  H/o seizures and TBI s/p subdural hematoma Elevated LFTs Hypothyroidism  PLAN: 1. Safety and Monitoring:  -- Voluntary admission to inpatient psychiatric unit for  safety, stabilization and treatment  -- Daily contact with patient to assess and evaluate symptoms and progress in treatment  -- Patient's case to be discussed in multi-disciplinary team meeting  -- Observation Level : q15 minute checks  -- Vital signs:  q12 hours  -- Precautions: suicide  2. Psychiatric Diagnoses and Treatment:   Unspecified depressive d/o (r/o substance induced depressive d/o)  -- Continue Prozac 70m daily for depressive symptoms   -- Finishing last dose of Neurontin taper today  -- Continue PRN Vistaril for anxiety  -- Increase Trazodone to 1050mqhs PRN for insomnia  -- Encouraged patient to participate in unit milieu and in scheduled group therapies   -- Short Term Goals: Ability to identify changes in lifestyle to reduce recurrence of condition will improve and Ability to identify and develop effective coping behaviors will improve  -- Long Term Goals: Improvement in symptoms so as ready for discharge   Alcohol use d/o severe  -- UDS positive for amphetamines and ETOH 188 on admission  -- Continue CIWA protocol with Ativan 22m73mor CIWA scores >10 and oral thiamine and folate replacement  -- Patient interested in residential rehab after discharge and presently waiting on bed availability   -- Discussed the need to abstain from benzodiazepine and stimulant use after discharge given addictions history and discussed risk of respiratory  suppression and death from mixing alcohol and benzodiazepines   -- Short Term Goals: Ability to identify triggers associated with substance abuse/mental health issues will improve  -- Long Term Goals: Improvement in symptoms so as ready for discharge   3. Medical Issues Being Addressed:   GERD   -- Continue Protonix 110m daily   Type II DM  -- Not on home medications per records  -- HbgA1c 4.6   Elevated LFTs   -- 05/22/20: AST 87 down from 185 and ALT 82 down from 138; alk phos elevated at 163  -- Holding Tylenol  -- Hepatitis  panel nonreactive and HIV nonreactive   Hypokalemia (K+ 3.0) - resolved  -- 05/19/20: K+ 3.8 and Mag+2.0 (remainder of BMP WNL)  -- Improved po intake encouraged    Thrombocytopenia (platelets 126)  -- appears chronically low when compared to previous labs - 86 9 months ago, 107 1 year ago)  -- Will need PCP f/u after discharge   H/o presumed alcohol withdrawal seizures with h/o TBI/subdural hematoma in 2021  -- Continue Keppra 5053mbid at this time   -- Tapering off Neurontin given recommendations from IM in context of his elevated LFTs and due to daytime sedation  -- Will need outpatient neurology f/u after discharge for additional management  -- On seizure precautions    Left foot pain  -- Radiographs negative for fracture  -- Has Ultram PRN for severe pain given NSAID allergy   Hypothyroidism  -- T3U elevated at 45 and FreeT4 low at 0.59 and TSH elevated at 5.479  -- per recommendations from IM will start low dose synthroid 2558mwith plans to recheck his thyroid labs in 4-6 weeks  -- will refer to PCP for recheck after discharge  4. Discharge Planning:   -- Social work and case management to assist with discharge planning and identification of hospital follow-up needs prior to discharge  -- Estimated LOS: 2-3 days  -- Discharge Concerns: Need to establish a safety plan; Medication compliance and effectiveness  -- Discharge Goals: Return home with outpatient referrals for mental health follow-up including medication management/psychotherapy  AmyHarlow AsaD, FAPA 05/22/2020, 11:29 AM

## 2020-05-23 DIAGNOSIS — R7989 Other specified abnormal findings of blood chemistry: Secondary | ICD-10-CM

## 2020-05-23 DIAGNOSIS — F332 Major depressive disorder, recurrent severe without psychotic features: Secondary | ICD-10-CM

## 2020-05-23 DIAGNOSIS — D696 Thrombocytopenia, unspecified: Secondary | ICD-10-CM | POA: Diagnosis present

## 2020-05-23 MED ORDER — TRAZODONE HCL 100 MG PO TABS: 100.0000 mg | ORAL_TABLET | Freq: Every evening | ORAL | 0 refills | Status: DC | PRN

## 2020-05-23 MED ORDER — FLUOXETINE HCL 20 MG PO CAPS: 20.0000 mg | ORAL_CAPSULE | Freq: Every day | ORAL | 0 refills | Status: DC

## 2020-05-23 MED ORDER — LEVETIRACETAM 500 MG PO TABS: 500.0000 mg | ORAL_TABLET | Freq: Two times a day (BID) | ORAL | 0 refills | Status: DC

## 2020-05-23 MED ORDER — LEVOTHYROXINE SODIUM 25 MCG PO TABS: 25.0000 ug | ORAL_TABLET | Freq: Every day | ORAL | 0 refills | Status: DC

## 2020-05-23 NOTE — [facility] Suicide Risk Assessment (Addendum)
[facility] Discharge Suicide Risk Assessment   Principal Problem: Alcohol-induced mood disorder with depressive symptoms ([place]) Discharge Diagnoses: Principal Problem:   Alcohol-induced mood disorder with depressive symptoms ([place]) Active Problems:   Diabetes mellitus, type 2 (HCC)   Elevated LFTs   Alcohol use disorder, severe, dependence (HCC)   GERD (gastroesophageal reflux disease)   History of seizures   Abnormal thyroid screen (blood)   Thrombocytopenia ([place])  Total Time Spent in Direct Patient Care:  I personally spent 50 minutes on the unit in direct patient care. The direct patient care time included face-to-face time with the patient, reviewing the patient's chart, communicating with other professionals, and coordinating care. Greater than 50% of this time was spent in counseling or coordinating care with the patient regarding goals of hospitalization, psycho-education, and discharge planning needs.  Subjective: Patient was seen on rounds in the presence of social work, and he reports that he is feeling mood stable. He denies depressed mood, SI or HI. He denies AVH, paranoia or delusions. He denies cravings for alcohol or current signs of acute withdrawal and voices no physical complaints. He denies medication side-effects and reports fair sleep and good appetite. Time was spent discussing the need for him to have outpatient mental health and substance abuse treatment after discharge. He was made aware that a residential rehab bed is not currently available and may not be available until next week. He agrees to start SAIOP until a bed becomes available and social work will set this up. He was advised that he has to call daily to the numbers provided to inquire about bed status at the residential rehab facilities and was encouraged to start AA. He was reminded that he needs to see a primary care provider without fail after discharge for recheck of his chronically low platelets, hypothyroidism, foot  pain, and elevated liver function enzymes. He was specifically advised to have recheck of his thyroid panel in the next 4-6 weeks after start of synthroid. He was advised to talk with his primary care provider about a neurology referral given his h/o seizures and previous TBI. His medications were reviewed and he was advised that he should not abruptly discontinue his medications. He was advised to abstain from alcohol, cigarettes, and illicit substances. Time was given for questions.   Musculoskeletal: Strength & Muscle Tone: within normal limits Gait & Station: normal, steady Patient leans: N/A  Psychiatric Specialty Exam: Review of Systems  Constitutional: Negative for appetite change and chills.  Respiratory: Negative for shortness of breath.   Cardiovascular: Negative for chest pain.  Gastrointestinal: Negative for abdominal pain, diarrhea, nausea and vomiting.  Neurological: Negative for tremors and headaches.  Hematological: Does not bruise/bleed easily.    Blood pressure 132/86, pulse 64, temperature (!) 97.4 F (36.3 C), temperature source Oral, resp. rate 18, height 6\' 2"  (1.88 m), weight 80.3 kg, SpO2 99 %.Body mass index is 22.73 kg/m.  General Appearance: casually dressed, good hygiene, well engaged with examiner  Eye Contact::  Good  Speech:  Clear and Coherent and Normal Rate409  Volume:  Normal  Mood:  Euthymic  Affect:  moderate, stable, full  Thought Process:  Goal Directed and Linear  Orientation:  Full (Time, Place, and Person)  Thought Content:  Logical and no evidence of acute psychosis, paranoia, or delusions  Suicidal Thoughts:  No  Homicidal Thoughts:  No  Memory:  Recent;   Good  Judgement:  Fair  Insight:  Fair  Psychomotor Activity:  Normal  Concentration:  Good  Recall:  [place] [place] of Knowledge:Good  Language: Good  Akathisia:  Negative  Assets:  Communication Skills Desire for Improvement Financial Resources/Insurance Housing Resilience Social  Support Talents/Skills  Sleep:  Number of Hours: 5  Cognition: WNL  ADL's:  Intact   Mental Status Per Nursing Assessment::   On Admission:  Suicidal ideation indicated by patient,Intention to act on suicide plan,Suicidal ideation indicated by others,Self-harm thoughts  Demographic Factors:  Male, Caucasian and Living alone,unemployed   Loss Factors: Decline in physical health, decrease in vocational status, change in socioeconomic status  Historical Factors: Impulsivity and substance use prior to admission, family h/o substance abuse; previous psychiatric treatment/diagnoses  Risk Reduction Factors:   Sense of responsibility to family, Positive social support and Positive coping skills or problem solving skills  Continued Clinical Symptoms:  Depression:   Impulsivity Alcohol/Substance Abuse/Dependencies  Cognitive Features That Contribute To Risk:  None    Suicide Risk:  Minimal: No identifiable suicidal ideation.  Patients presenting with no risk factors but with morbid ruminations; may be classified as minimal risk based on the severity of the depressive symptoms   [facility] Follow up.   Why: Please call this facility daily to check on open beds.  Contact information: [place] [address] [phone]        [facility]. Go on 05/27/2020.   Specialty: Family Medicine Why: You have a hospital follow up appointment for primary care services on 05/27/20 at 10:45 am.   This appointment will be held in person.  Please inquire about receiving a neurological referral. Contact information: [address] [place] [address] [phone]        [facility] Follow up.   Why: Please call this facility daily to check on open beds.  Contact information: [address] [place] [address] [phone]        [facility]. Go on 05/27/2020.   Specialty: Behavioral Health Why: You have an appointment for therapy services on 05/27/20 at 7:45 am.  You also have an appointment for medication management on 06/21/20 at 7:45 am.  These appointments will be held in person and are first come, first served, for this provider. Contact information: [place] [phone]              Plan Of Care/Follow-up recommendations:  Activity:  as tolerated Diet:  heart healthy Other:  Patient advised to remain compliant with scheduled medications and to keep appointment to start intensive outpatient substance abuse treatment after discharge. He understands he is on a waiting list for a residential rehab bed and that he needs to call the numbers provided daily to inquire about bed status. He was encouraged to start AA.He was advised not to drink alcohol or use illicit substances after discharge and to work on smoking cessation. He was advised that he should avoid Tylenol after discharge given his elevated liver enzymes. He was told to see a primary care provider as scheduled for outpatient recheck of his elevated liver enzymes and recheck of his chronically low platelets. He was advised that he needs recheck of his thyroid labs after start of synthroid within 4-6 weeks without fail by his primary care physician. He was advised not to abruptly stop Keppra without risk of return of seizures and that he needs to talk to his primary  care provider about a neurology referral to further evaluate his seizures after his previous traumatic brain injury. His primary care provider can also reassess his foot pain and he was told to ice and elevate his foot as needed.  Harlow Asa, MD, FAPA 05/23/2020, 3:14 PM

## 2020-05-23 NOTE — [facility] Suicide Risk Assessment (Signed)
CSW spoke with pt's sister, Keenan Bachelor (sister) [phone] who stated that she "got all the firearms she could" but that her brother is the only one with the code to the safe and that "he does not have time for this.". CSW and Dr. Nelda Marseille explained the importance of removing firearms from the home. Ms. Nickola Major shared that she was upset that we were not holding the pt until he could get into residential treatment. CSW and MD shared that there are no beds open until next week but that pt is ready for discharge and has outpatient services to bridge him until a bed is open. CSW explained that pt could not discharge home since weapons could not be removed that pt would either be discharged to a shelter or would need to go to a family member's home. Ms. Nickola Major continued to express that she was upset about that the pt discharged and stated that he could not go to a sheler. Ms. Nickola Major states that pt will come and live at her home and that she will pick up pt from [facility] at 4:30pm. CSW and MD both offered a family meeting for pt and sister but sister declined.    CSW and MD met with pt and he stated that he is ready to go home. Denied SI/HI as well as withdrawal symptoms. CSW discussed calling [facility] and [facility] for bed updates daily as well as attending [facility] once discharged until he could be accepted to residential treatment. Pt stated that he feels he is ready to stay sober. MD shared with pt that his sister was very upset and pt stated that "she gets a little carried away at times.". Pt declined family meeting.   Toney Reil, [place] Work[employer]

## 2020-05-23 NOTE — Progress Notes (Signed)
   05/22/20 2210  Psych Admission Type (Psych Patients Only)  Admission Status Voluntary  Psychosocial Assessment  Patient Complaints Anxiety  Eye Contact Fair  Facial Expression Anxious;Pensive;Sullen;Sad  Affect Anxious;Sad;Sullen  Speech Logical/coherent  Interaction Assertive  Motor Activity Fidgety  Appearance/Hygiene Unremarkable  Behavior Characteristics Appropriate to situation;Anxious  Mood Anxious  Thought Process  Coherency Concrete thinking  Content WDL  Delusions None reported or observed  Perception WDL  Hallucination None reported or observed  Judgment Poor  Confusion None  Danger to Self  Current suicidal ideation? Denies  Self-Injurious Behavior No self-injurious ideation or behavior indicators observed or expressed   Agreement Not to Harm Self Yes  Description of Agreement Verbal  Danger to Others  Danger to Others None reported or observed

## 2020-05-23 NOTE — Plan of Care (Signed)
Discharge note  Patient verbalizes readiness for discharge. Follow up plan explained, AVS, Transition record and SRA given. Prescriptions and teaching provided. Belongings returned and signed for. Suicide safety plan completed and signed. Patient verbalizes understanding. Patient denies SI/HI and assures this Probation officer they will seek assistance should that change. Patient discharged to lobby where sister was.  Problem: Education: Goal: Ability to state activities that reduce stress will improve Outcome: Adequate for Discharge   Problem: Coping: Goal: Ability to identify and develop effective coping behavior will improve Outcome: Adequate for Discharge   Problem: Self-Concept: Goal: Ability to identify factors that promote anxiety will improve Outcome: Adequate for Discharge Goal: Level of anxiety will decrease Outcome: Adequate for Discharge Goal: Ability to modify response to factors that promote anxiety will improve Outcome: Adequate for Discharge   Problem: Education: Goal: Utilization of techniques to improve thought processes will improve Outcome: Adequate for Discharge Goal: Knowledge of the prescribed therapeutic regimen will improve 05/23/2020 1626 by Baron Sane, RN Outcome: Adequate for Discharge 05/23/2020 0932 by Baron Sane, RN Outcome: Progressing   Problem: Activity: Goal: Interest or engagement in leisure activities will improve 05/23/2020 1626 by Baron Sane, RN Outcome: Adequate for Discharge 05/23/2020 0932 by Baron Sane, RN Outcome: Progressing Goal: Imbalance in normal sleep/wake cycle will improve 05/23/2020 1626 by Baron Sane, RN Outcome: Adequate for Discharge 05/23/2020 0932 by Baron Sane, RN Outcome: Progressing   Problem: Coping: Goal: Coping ability will improve Outcome: Adequate for Discharge Goal: Will verbalize feelings Outcome: Adequate for Discharge   Problem: Health Behavior/Discharge Planning: Goal:  Ability to make decisions will improve Outcome: Adequate for Discharge Goal: Compliance with therapeutic regimen will improve Outcome: Adequate for Discharge   Problem: Role Relationship: Goal: Will demonstrate positive changes in social behaviors and relationships Outcome: Adequate for Discharge   Problem: Safety: Goal: Ability to disclose and discuss suicidal ideas will improve Outcome: Adequate for Discharge Goal: Ability to identify and utilize support systems that promote safety will improve Outcome: Adequate for Discharge   Problem: Self-Concept: Goal: Will verbalize positive feelings about self Outcome: Adequate for Discharge Goal: Level of anxiety will decrease Outcome: Adequate for Discharge   Problem: Education: Goal: Knowledge of [facility] Education information/materials will improve Outcome: Adequate for Discharge Goal: Emotional status will improve Outcome: Adequate for Discharge Goal: Mental status will improve Outcome: Adequate for Discharge Goal: Verbalization of understanding the information provided will improve Outcome: Adequate for Discharge   Problem: Activity: Goal: Interest or engagement in activities will improve Outcome: Adequate for Discharge Goal: Sleeping patterns will improve Outcome: Adequate for Discharge   Problem: Coping: Goal: Ability to verbalize frustrations and anger appropriately will improve Outcome: Adequate for Discharge Goal: Ability to demonstrate self-control will improve Outcome: Adequate for Discharge   Problem: Health Behavior/Discharge Planning: Goal: Identification of resources available to assist in meeting health care needs will improve Outcome: Adequate for Discharge Goal: Compliance with treatment plan for underlying cause of condition will improve Outcome: Adequate for Discharge   Problem: Physical Regulation: Goal: Ability to maintain clinical measurements within normal limits will improve Outcome:  Adequate for Discharge   Problem: Safety: Goal: Periods of time without injury will increase Outcome: Adequate for Discharge   Problem: Activity: Goal: Will identify at least one activity in which they can participate Outcome: Adequate for Discharge   Problem: Coping: Goal: Ability to identify and develop effective coping behavior will improve Outcome: Adequate for Discharge Goal: Ability to interact with others will  improve Outcome: Adequate for Discharge Goal: Demonstration of participation in decision-making regarding own care will improve Outcome: Adequate for Discharge Goal: Ability to use eye contact when communicating with others will improve Outcome: Adequate for Discharge   Problem: Health Behavior/Discharge Planning: Goal: Identification of resources available to assist in meeting health care needs will improve Outcome: Adequate for Discharge   Problem: Self-Concept: Goal: Will verbalize positive feelings about self Outcome: Adequate for Discharge   Problem: Education: Goal: Ability to make informed decisions regarding treatment will improve Outcome: Adequate for Discharge   Problem: Coping: Goal: Coping ability will improve Outcome: Adequate for Discharge   Problem: Health Behavior/Discharge Planning: Goal: Identification of resources available to assist in meeting health care needs will improve Outcome: Adequate for Discharge   Problem: Medication: Goal: Compliance with prescribed medication regimen will improve Outcome: Adequate for Discharge   Problem: Self-Concept: Goal: Ability to disclose and discuss suicidal ideas will improve Outcome: Adequate for Discharge Goal: Will verbalize positive feelings about self Outcome: Adequate for Discharge   Problem: Education: Goal: Knowledge of disease or condition will improve Outcome: Adequate for Discharge Goal: Understanding of discharge needs will improve Outcome: Adequate for Discharge   Problem: Health  Behavior/Discharge Planning: Goal: Ability to identify changes in lifestyle to reduce recurrence of condition will improve Outcome: Adequate for Discharge Goal: Identification of resources available to assist in meeting health care needs will improve Outcome: Adequate for Discharge   Problem: Physical Regulation: Goal: Complications related to the disease process, condition or treatment will be avoided or minimized Outcome: Adequate for Discharge   Problem: Safety: Goal: Ability to remain free from injury will improve Outcome: Adequate for Discharge

## 2020-05-23 NOTE — Plan of Care (Signed)
  Problem: Education: Goal: Knowledge of the prescribed therapeutic regimen will improve Outcome: Progressing   Problem: Activity: Goal: Interest or engagement in leisure activities will improve Outcome: Progressing Goal: Imbalance in normal sleep/wake cycle will improve Outcome: Progressing

## 2020-05-23 NOTE — Progress Notes (Signed)
   05/22/20 2210  COVID-19 Daily Checkoff  Have you had a fever (temp > 37.80C/100F)  in the past 24 hours?  No  If you have had runny nose, nasal congestion, sneezing in the past 24 hours, has it worsened? No  COVID-19 EXPOSURE  Have you traveled outside the state in the past 14 days? No  Have you been in contact with someone with a confirmed diagnosis of COVID-19 or PUI in the past 14 days without wearing appropriate PPE? No  Have you been diagnosed with COVID-19? No

## 2020-05-23 NOTE — Progress Notes (Signed)
Recreation Therapy Notes  Date:  5.2.22 Time: 0947 Location: [facility]  Group Topic: Stress Management  Goal Area(s) Addresses:  Patient will identify positive stress management techniques. Patient will identify benefits of using stress management post d/c.  Behavioral Response: Engaged  Intervention: Stress Management  Activity: Meditation.  LRT played meditation that focused on being able to free yourself by not holding on to things you can't change.  Patients were to listen and follow as meditation played to engage in activity.   Education:  Stress Management, Discharge Planning.   Education Outcome: Acknowledges Education  Clinical Observations/Feedback: Pt attended and participated in group activity.   Victorino Sparrow, LRT/CTRS         Victorino Sparrow A 05/23/2020 10:57 AM

## 2020-05-23 NOTE — Discharge Summary (Signed)
Physician Discharge Summary Note  Patient:  Brandon Miller is an 55 y.o., male MRN:  [id number] DOB:  1965-05-07 Patient phone:  [phone] (home)  Patient address:   [place] [address],  Total Time spent with patient: 30 minutes  Date of Admission:  05/17/2020 Date of Discharge: 05/23/2020  Reason for Admission:  (From MD's admission note): Patient is a 55 year old male with a past psychiatric history significant for alcohol dependence who presented to the [facility] on 04/2020 with law enforcement for suicidal ideation. The patient stated that he is sister called the police on the date of admission because he had made a suicidal statement. The patient stated that "I did not really mean it". He is frustrated and struggling. He has been drinking approximately a case of beer a day. He has been unemployed, and that made things worse. He is getting out of a bad relationship with a male. He has had some worsening medical problems including a previous head injury and postconcussion syndrome. He had a psychiatric hospitalization for similar circumstances at [facility] in 01/09/2020. He stated he only stayed sober for a few days after that, and did not go to rehab. Shortly after that he suffered a subdural hematoma and was in the [facility] emergency department on 01/21/2020 with that. He has followed up as an outpatient with neuro. He admitted that he had had a seizure approximately 3 years ago. This was an alcohol-related seizure. He is also prescribed clonazepam 1 mg p.o. twice daily and Adderall 20 mg p.o. daily by his primary care provider. There is also hydrocodone prescription from 03/31/2020. He admitted to helplessness, hopelessness and worthlessness. He denied suicidal ideation. He is considering going to a rehabilitation facility. He has not decided at this point. He was  admitted to the hospital for evaluation and stabilization.  Evaluation on the unit today: Patient was seen and evaluated. Patient denies SI/HI/AVH, paranoia and delusions. Patient is taking his medications as prescribed and has no issues with them. Patient's UDS was positive for amphetamines and his BAL was 188 on admission. He was placed on a CIWA protocol for alcohol withdrawal and received the appropriate medications for any withdrawal symptoms. Patient has been attending group therapy and working on coping skills. Patient wants to go to a residential substance abuse treatment facility but there are no beds available until next week. His sister has been contacted for a safety plan. She was upset that her brother is being discharged before he can go to rehab. CSW and Dr Nelda Marseille spent time attempting to explain to her that the patient is psychiatrically stable and is not a danger to himself or others. His sister was on speaker phone and was angry, she would not listen to reason. She stated the patient can come to stay with her since she has not cleared or secured  the weapons from his house, as she was instructed to do when he was admitted. Patient's sister and the patient were both offered a family meeting when she arrives so her concerns can be discussed with the patient present. They have both stated they do not want a family meeting. Patient has outpatient appointments as listed in his discharge paperwork. Patient has been instructed to call rehab facilities daily to check for open beds. CSW was informed a bed should be available next week. Patient is stable for discharge home today. His sister will pick hm up.  Principal Problem: Alcohol-induced mood disorder with depressive symptoms ([place]) Discharge Diagnoses: Principal Problem:   Alcohol-induced mood disorder with depressive symptoms (HCC) Active Problems:   Diabetes mellitus, type 2 (HCC)   Elevated LFTs   Alcohol use disorder, severe,  dependence (HCC)   GERD (gastroesophageal reflux disease)   History of seizures   Abnormal thyroid screen (blood)   Thrombocytopenia (HCC)   Past Psychiatric History: See H&P  Past Medical History:  Past Medical History:  Diagnosis Date  . Allergy   . Anemia   . Anxiety   . Arthritis    RA  . Complication of anesthesia    hard to wake up-was told he may have sleep apnea-never tested  . Depression   . Diabetes mellitus without complication ([place])    with weight no more problems  . Seizure ([place])    had one seizure of unknown etiology a few tears ago, none since and on no meds for them  . Sleep apnea   . Snores     Past Surgical History:  Procedure Laterality Date  . BIOPSY  02/12/2019   Procedure: BIOPSY;  Surgeon: Daneil Dolin, MD;  [facility];  Service: Endoscopy;;  stomach  . COLONOSCOPY WITH PROPOFOL N/A 02/12/2019   Procedure: COLONOSCOPY WITH PROPOFOL;  Surgeon: Daneil Dolin, MD;  [facility];  Service: Endoscopy;  Laterality: N/A;  10:30am  . ELBOW SURGERY Right 2010   Dr. Percell Miller  . ESOPHAGOGASTRODUODENOSCOPY (EGD) WITH PROPOFOL N/A 02/12/2019   Procedure: ESOPHAGOGASTRODUODENOSCOPY (EGD) WITH PROPOFOL;  Surgeon: Daneil Dolin, MD;  [facility];  Service: Endoscopy;  Laterality: N/A;  . HERNIA REPAIR     Right inguinal  . KNEE ARTHROSCOPY WITH MEDIAL MENISECTOMY Right 06/11/2013   Procedure: RIGHT KNEE ARTHROSCOPY WITH MEDIAL MENISECTOMY AND CHONDROPLASTY;  Surgeon: Ninetta Lights, MD;  Location: [place];  Service: Orthopedics;  Laterality: Right;  Chondroplasty, loose bodies, medial menisectomy, lysis of adhesions.  Marland Kitchen KNEE SURGERY Right 2011  . POLYPECTOMY  02/12/2019   Procedure: POLYPECTOMY;  Surgeon: Daneil Dolin, MD;  [facility];  Service: Endoscopy;;  . right ankle    . SHOULDER SURGERY Right 2010   Dr. Percell Miller  . TOTAL KNEE ARTHROPLASTY Right 08/25/2014   Procedure: TOTAL KNEE  ARTHROPLASTY;  Surgeon: Ninetta Lights, MD;  Location: [place];  Service: Orthopedics;  Laterality: Right;   Family History:  Family History  Problem Relation Age of Onset  . Hepatitis Mother   . Cirrhosis Mother        Did not drink  . Pneumonia Father   . Kidney failure Father   . Gastric cancer Brother        ? Gastric CA spread to esophagus  . Colon cancer Neg Hx    Family Psychiatric  History: See H&P Social History:  Social History   Substance and Sexual Activity  Alcohol Use Yes   Comment: 10-20/day cans beer     Social History   Substance and Sexual Activity  Drug Use No    Social History   Socioeconomic History  . Marital status: Single    Spouse name: Not on file  . Number of children: Not on file  . Years of education: Not on file  . Highest education level: Not on file  Occupational History  . Occupation: unemployed  Tobacco Use  . Smoking status: Current Every Day Smoker    Packs/day: 1.50  Years: 25.00    Pack years: 37.50    Types: Cigarettes  . Smokeless tobacco: Never Used  Vaping Use  . Vaping Use: Never used  Substance and Sexual Activity  . Alcohol use: Yes    Comment: 10-20/day cans beer  . Drug use: No  . Sexual activity: Not Currently  Other Topics Concern  . Not on file  Social History Narrative  . Not on file   Social Determinants of Health   Financial Resource Strain: Not on file  Food Insecurity: Not on file  Transportation Needs: Not on file  Physical Activity: Not on file  Stress: Not on file  Social Connections: Not on file    Hospital Course:  After the above admission evaluation, Tedric's presenting symptoms were noted. He was recommended for mood stabilization treatments. The medication regimen targeting those presenting symptoms were discussed with him & initiated with his consent. His UDS on arrival to the ED was positive for amphetamines, his BAL was 188,  however, he did develop some  alcohol withdrawal symptoms &  received alcohol detoxification treatments. He was medicated, stabilized & discharged on the medications as listed on his discharge medication list below. Besides the mood stabilization treatments, Ezekiel was also enrolled & participated in the group counseling sessions being offered & held on this unit. He learned coping skills. He presented no other significant pre-existing medical issues that required treatment. He tolerated his treatment regimen without any adverse effects or reactions reported.   During the course of his hospitalization, the 15-minute checks were adequate to ensure patient's safety. Kofi did not display any dangerous, violent or suicidal behavior on the unit.  He interacted with patients & staff appropriately, participated appropriately in the group sessions/therapies. His medications were addressed & adjusted to meet his needs. He was recommended for outpatient follow-up care & medication management upon discharge to assure continuity of care & mood stability.  At the time of discharge patient is not reporting any acute suicidal/homicidal ideations. He feels more confident about his self-care & in managing his mental health. He currently denies any new issues or concerns. Education and supportive counseling provided throughout his hospital stay & upon discharge.   Today upon his discharge evaluation with the attending psychiatrist, Izrael shares he is doing well. He denies any other specific concerns. He is sleeping well. His appetite is good. He denies other physical complaints. He denies AH/VH, delusional thoughts or paranoia. He does not appear to be responding to any internal stimuli. He feels that his medications have been helpful & is in agreement to continue his current treatment regimen as recommended. He was able to engage in safety planning including plan to return to [facility] or contact emergency services if he feels unable to maintain his own safety or the safety of others. Pt had  no further questions, comments, or concerns. He left [facility] with all personal belongings in no apparent distress. Transportation per his sister's private vehicle.   Physical Findings: AIMS: Facial and Oral Movements Muscles of Facial Expression: None, normal Lips and Perioral Area: None, normal Jaw: None, normal Tongue: None, normal,Extremity Movements Upper (arms, wrists, hands, fingers): None, normal Lower (legs, knees, ankles, toes): None, normal, Trunk Movements Neck, shoulders, hips: None, normal, Overall Severity Severity of abnormal movements (highest score from questions above): None, normal Incapacitation due to abnormal movements: None, normal Patient's awareness of abnormal movements (rate only patient's report): No Awareness, Dental Status Current problems with teeth and/or dentures?: No Does patient usually wear  dentures?: No  CIWA:  CIWA-Ar Total: 0 COWS:     Musculoskeletal: Strength & Muscle Tone: within normal limits Gait & Station: normal Patient leans: N/A  Psychiatric Specialty Exam:  Presentation  General Appearance: Appropriate for Environment; Casual  Eye Contact:Good  Speech:Normal Rate; Clear and Coherent  Speech Volume:Normal  Handedness:Right  Mood and Affect  Mood:Euthymic  Affect:Appropriate; Congruent  Thought Process  Thought Processes:Coherent  Descriptions of Associations:Intact  Orientation:Full (Time, Place and Person)  Thought Content:Logical  History of Schizophrenia/Schizoaffective disorder:No data recorded Duration of Psychotic Symptoms:No data recorded Hallucinations:No data recorded Ideas of Reference:None  Suicidal Thoughts:No data recorded Homicidal Thoughts:No data recorded  Sensorium  Memory:Immediate Fair; Recent Fair; Remote Fair  Judgment:Fair  Insight:Fair  Executive Functions  Concentration:Fair  Attention Span:Fair  [place]  Psychomotor Activity   Psychomotor Activity:No data recorded  Assets  Assets:Desire for Improvement; Resilience; Housing; Armed forces logistics/support/administrative officer; Financial Resources/Insurance; Social Support  Sleep  Sleep:No data recorded  Physical Exam: Physical Exam Vitals and nursing note reviewed.  Constitutional:      Appearance: Normal appearance.  HENT:     Head: Normocephalic.  Pulmonary:     Effort: Pulmonary effort is normal.  Musculoskeletal:        General: Normal range of motion.     Cervical back: Normal range of motion.  Neurological:     Mental Status: He is alert and oriented to person, place, and time.  Psychiatric:        Attention and Perception: Attention normal. He does not perceive auditory or visual hallucinations.        Mood and Affect: Mood normal.        Speech: Speech normal.        Behavior: Behavior normal. Behavior is cooperative.        Thought Content: Thought content normal. Thought content is not paranoid or delusional. Thought content does not include homicidal or suicidal ideation. Thought content does not include homicidal or suicidal plan.        Cognition and Memory: Cognition normal.    Review of Systems  Constitutional: Negative for fever.  HENT: Negative for congestion and sore throat.   Respiratory: Negative for cough and shortness of breath.   Cardiovascular: Positive for chest pain.  Gastrointestinal: Negative.   Genitourinary: Negative.   Musculoskeletal: Negative.   Neurological: Negative.    Blood pressure 113/72, pulse (!) 55, temperature (!) 97.4 F (36.3 C), temperature source Oral, resp. rate 18, height 6\' 2"  (1.88 m), weight 80.3 kg, SpO2 99 %. Body mass index is 22.73 kg/m.   Have you used any form of tobacco in the last 30 days? (Cigarettes, Smokeless Tobacco, Cigars, and/or Pipes): Yes  Has this patient used any form of tobacco in the last 30 days? (Cigarettes, Smokeless Tobacco, Cigars, and/or Pipes) Yes, Yes, A prescription for an FDA-approved  tobacco cessation medication was offered at discharge and the patient refused  Blood Alcohol level:  Lab Results  Component Value Date   ETH 188 (H) 05/17/2020   ETH 129 (H) 24/58/0998    Metabolic Disorder Labs:  Lab Results  Component Value Date   HGBA1C 4.6 (L) 05/19/2020   MPG 85.32 05/19/2020   MPG 131 08/25/2014   No results found for: PROLACTIN No results found for: CHOL, TRIG, HDL, CHOLHDL, VLDL, LDLCALC  See Psychiatric Specialty Exam and Suicide Risk Assessment completed by Attending Physician prior to discharge.  Discharge destination:  Other:  sisters home  Is  patient on multiple antipsychotic therapies at discharge:  No   Has Patient had three or more failed trials of antipsychotic monotherapy by history:  No  Recommended Plan for Multiple Antipsychotic Therapies: NA  Discharge Instructions    Diet - low sodium heart healthy   Complete by: As directed    Increase activity slowly   Complete by: As directed      Allergies as of 05/23/2020      Reactions   Daypro [oxaprozin] Other (See Comments)   'tears his stomach up"   Diclofenac Sodium Other (See Comments)   dizzy   Indocin [indomethacin] Nausea And Vomiting   Zanaflex [tizanidine Hcl] Swelling      Medication List    STOP taking these medications   clonazePAM 1 MG tablet Commonly known as: KLONOPIN   gabapentin 300 MG capsule Commonly known as: NEURONTIN   LORazepam 1 MG tablet Commonly known as: ATIVAN   thiamine 100 MG tablet     TAKE these medications     Indication  FLUoxetine 20 MG capsule Commonly known as: PROZAC Take 1 capsule (20 mg total) by mouth daily.  Indication: Major Depressive Disorder   levETIRAcetam 500 MG tablet Commonly known as: KEPPRA Take 1 tablet (500 mg total) by mouth 2 (two) times daily.  Indication: Seizure   levothyroxine 25 MCG tablet Commonly known as: SYNTHROID Take 1 tablet (25 mcg total) by mouth daily at 6 (six) AM. Start taking on: May 24, 2020  Indication: Underactive Thyroid   multivitamin with minerals Tabs tablet Take 1 tablet by mouth daily.  Indication: Nutritional Support   traZODone 100 MG tablet Commonly known as: DESYREL Take 1 tablet (100 mg total) by mouth at bedtime as needed for sleep.  Indication: [facility] Follow up.   Why: Please call this facility daily to check on open beds.  Contact information: [place] [address] [phone]        [facility]. Go on 05/27/2020.   Specialty: Family Medicine Why: You have a hospital follow up appointment for primary care services on 05/27/20 at 10:45 am.   This appointment will be held in person.  Please inquire about receiving a neurological referral. Contact information: [address] [place] [address] [phone]        [facility] Follow up.   Why: Please call this facility daily to check on open beds.  Contact information: [address] [place] [address] [phone]        [facility]. Go on 05/27/2020.   Specialty: Behavioral Health Why: You have an appointment for therapy services on 05/27/20 at 7:45 am.  You also have an appointment for medication management on 06/21/20 at 7:45 am.  These appointments will be held in person and are first come, first served, for this provider. Contact information: [place] [phone]              Follow-up recommendations:  Activity:  as tolerated Diet:  Heart Healthy  Comments: Prescriptions provided at discharge.  Patient is  agreeable to the discharge plan.  He was given opportunity to ask questions.  He appears to feel comfortable with discharge and denies any current suicidal or homicidal thoughts.   Patient is instructed prior to discharge to: Take all medications  as prescribed by his mental healthcare provider. Report any adverse effects and or reactions from the medicines to his outpatient provider promptly. Patient has been instructed & cautioned: To not engage in alcohol and or illegal drug use while on prescription medicines. In the event of worsening symptoms, patient is instructed to call the crisis hotline, 911 and or go to the nearest ED for appropriate evaluation and treatment of symptoms. To follow-up with his primary care provider for your other medical issues, concerns and or health care needs.   Signed: Ethelene Hal, NP 05/23/2020, 2:18 PM

## 2020-05-23 NOTE — Tx Team (Signed)
Interdisciplinary Treatment and Diagnostic Plan Update  05/23/2020 Time of Session: 9:00am  Brandon Miller MRN: [id number]  Principal Diagnosis: Alcohol-induced mood disorder with depressive symptoms ([place])  Secondary Diagnoses: Principal Problem:   Alcohol-induced mood disorder with depressive symptoms ([place]) Active Problems:   Diabetes mellitus, type 2 (HCC)   Elevated LFTs   Alcohol use disorder, severe, dependence (HCC)   GERD (gastroesophageal reflux disease)   History of seizures   Abnormal thyroid screen (blood)   Thrombocytopenia (HCC)   Current Medications:  Current Facility-Administered Medications  Medication Dose Route Frequency Provider Last Rate Last Admin  . alum & mag hydroxide-simeth (MAALOX/MYLANTA) 200-200-20 MG/5ML suspension 30 mL  30 mL Oral Q4H PRN Sharma Covert, MD      . feeding supplement (ENSURE ENLIVE / ENSURE PLUS) liquid 237 mL  237 mL Oral BID BM Sharma Covert, MD   237 mL at 05/23/20 1502  . FLUoxetine (PROZAC) capsule 20 mg  20 mg Oral Daily Sharma Covert, MD   20 mg at 05/23/20 0757  . folic acid (FOLVITE) tablet 1 mg  1 mg Oral Daily Sharma Covert, MD   1 mg at 05/23/20 0757  . hydrOXYzine (ATARAX/VISTARIL) tablet 25 mg  25 mg Oral Q6H PRN Harlow Asa, MD   25 mg at 05/22/20 1918  . ibuprofen (ADVIL) tablet 600 mg  600 mg Oral Q6H PRN Prescilla Sours, PA-C   600 mg at 05/22/20 2208  . levETIRAcetam (KEPPRA) tablet 500 mg  500 mg Oral BID Sharma Covert, MD   500 mg at 05/23/20 0757  . levothyroxine (SYNTHROID) tablet 25 mcg  25 mcg Oral Q0600 Harlow Asa, MD   25 mcg at 05/23/20 1443  . loperamide (IMODIUM) capsule 2-4 mg  2-4 mg Oral PRN Nelda Marseille, Amy E, MD      . LORazepam (ATIVAN) tablet 1 mg  1 mg Oral Q6H PRN Nelda Marseille, Amy E, MD      . magnesium hydroxide (MILK OF MAGNESIA) suspension 30 mL  30 mL Oral Daily PRN Sharma Covert, MD      . multivitamin with minerals tablet 1 tablet  1 tablet Oral Daily  Sharma Covert, MD   1 tablet at 05/23/20 0757  . nicotine polacrilex (NICORETTE) gum 2 mg  2 mg Oral PRN Prescilla Sours, PA-C   2 mg at 05/23/20 1502  . ondansetron (ZOFRAN-ODT) disintegrating tablet 4 mg  4 mg Oral Q6H PRN Nelda Marseille, Amy E, MD      . pantoprazole (PROTONIX) EC tablet 40 mg  40 mg Oral Daily Sharma Covert, MD   40 mg at 05/23/20 0757  . thiamine tablet 100 mg  100 mg Oral Daily Sharma Covert, MD   100 mg at 05/23/20 0757  . traMADol (ULTRAM) tablet 50 mg  50 mg Oral Q12H PRN Harlow Asa, MD      . traZODone (DESYREL) tablet 100 mg  100 mg Oral QHS PRN Harlow Asa, MD   100 mg at 05/22/20 2052   PTA Medications: Medications Prior to Admission  Medication Sig Dispense Refill Last Dose  . clonazePAM (KLONOPIN) 1 MG tablet Take 1 mg by mouth 2 (two) times daily.     Marland Kitchen gabapentin (NEURONTIN) 300 MG capsule Take 1 capsule (300 mg total) by mouth 3 (three) times daily.     Marland Kitchen LORazepam (ATIVAN) 1 MG tablet Take 1 tablet (1 mg total) by mouth every 6 (six) hours  as needed (CIWA > 10). 1 tablet 0   . Multiple Vitamin (MULTIVITAMIN WITH MINERALS) TABS tablet Take 1 tablet by mouth daily.     Marland Kitchen thiamine 100 MG tablet Take 1 tablet (100 mg total) by mouth daily.     . [DISCONTINUED] FLUoxetine (PROZAC) 20 MG capsule Take 1 capsule by mouth daily.       Patient Stressors: Financial difficulties Health problems Substance abuse  Patient Strengths: Ability for insight Capable of independent living Communication skills Supportive family/friends Work skills  Treatment Modalities: Medication Management, Group therapy, Case management,  1 to 1 session with clinician, Psychoeducation, Recreational therapy.   Physician Treatment Plan for Primary Diagnosis: Alcohol-induced mood disorder with depressive symptoms ([place]) Long Term Goal(s): Improvement in symptoms so as ready for discharge Improvement in symptoms so as ready for discharge   Short Term Goals: Ability  to identify changes in lifestyle to reduce recurrence of condition will improve Ability to identify and develop effective coping behaviors will improve Ability to identify triggers associated with substance abuse/mental health issues will improve  Medication Management: Evaluate patient's response, side effects, and tolerance of medication regimen.  Therapeutic Interventions: 1 to 1 sessions, Unit Group sessions and Medication administration.  Evaluation of Outcomes: Adequate for Discharge  Physician Treatment Plan for Secondary Diagnosis: Principal Problem:   Alcohol-induced mood disorder with depressive symptoms (HCC) Active Problems:   Diabetes mellitus, type 2 (HCC)   Elevated LFTs   Alcohol use disorder, severe, dependence (HCC)   GERD (gastroesophageal reflux disease)   History of seizures   Abnormal thyroid screen (blood)   Thrombocytopenia (HCC)  Long Term Goal(s): Improvement in symptoms so as ready for discharge Improvement in symptoms so as ready for discharge   Short Term Goals: Ability to identify changes in lifestyle to reduce recurrence of condition will improve Ability to identify and develop effective coping behaviors will improve Ability to identify triggers associated with substance abuse/mental health issues will improve     Medication Management: Evaluate patient's response, side effects, and tolerance of medication regimen.  Therapeutic Interventions: 1 to 1 sessions, Unit Group sessions and Medication administration.  Evaluation of Outcomes: Adequate for Discharge   RN Treatment Plan for Primary Diagnosis: Alcohol-induced mood disorder with depressive symptoms ([place]) Long Term Goal(s): Knowledge of disease and therapeutic regimen to maintain health will improve  Short Term Goals: Ability to remain free from injury will improve, Ability to participate in decision making will improve, Ability to verbalize feelings will improve, Ability to disclose and discuss  suicidal ideas and Ability to identify and develop effective coping behaviors will improve  Medication Management: RN will administer medications as ordered by provider, will assess and evaluate patient's response and provide education to patient for prescribed medication. RN will report any adverse and/or side effects to prescribing provider.  Therapeutic Interventions: 1 on 1 counseling sessions, Psychoeducation, Medication administration, Evaluate responses to treatment, Monitor vital signs and CBGs as ordered, Perform/monitor CIWA, COWS, AIMS and Fall Risk screenings as ordered, Perform wound care treatments as ordered.  Evaluation of Outcomes: Adequate for Discharge   LCSW Treatment Plan for Primary Diagnosis: Alcohol-induced mood disorder with depressive symptoms ([place]) Long Term Goal(s): Safe transition to appropriate next level of care at discharge, Engage patient in therapeutic group addressing interpersonal concerns.  Short Term Goals: Engage patient in aftercare planning with referrals and resources, Increase social support, Increase emotional regulation, Facilitate acceptance of mental health diagnosis and concerns, Identify triggers associated with mental health/substance abuse issues and Increase  skills for wellness and recovery  Therapeutic Interventions: Assess for all discharge needs, 1 to 1 time with Social worker, Explore available resources and support systems, Assess for adequacy in community support network, Educate family and significant other(s) on suicide prevention, Complete Psychosocial Assessment, Interpersonal group therapy.  Evaluation of Outcomes: Adequate for Discharge   Progress in Treatment: Attending groups: Yes. and No. Participating in groups: Yes. Taking medication as prescribed: Yes. Toleration medication: Yes. Family/Significant other contact made: Yes, individual(s) contacted:  Sister Patient understands diagnosis: No. Discussing patient identified  problems/goals with staff: Yes. Medical problems stabilized or resolved: Yes. Denies suicidal/homicidal ideation: Yes. Issues/concerns per patient self-inventory: No.   New problem(s) identified: No, Describe:  None  New Short Term/Long Term Goal(s): medication stabilization, elimination of SI thoughts, development of comprehensive mental wellness plan.   Patient Goals: "To quit drinking"  Discharge Plan or Barriers: Patient is to discharge to home and is to follow up with [facility] and [facility] for residential substance use treatment   Reason for Continuation of Hospitalization: Medication stabilization  Estimated Length of Stay: Adequate for discharge  Attendees: Patient:  05/23/2020   Physician: 05/23/2020   Nursing:  05/23/2020   RN Care Manager: 05/23/2020   Social Worker: Darletta Moll, LCSW 05/23/2020   Recreational Therapist:  05/23/2020   Other:  05/23/2020   Other:  05/23/2020   Other: 05/23/2020     Scribe for Treatment Team: Vassie Moselle, [place] 05/23/2020 3:36 PM

## 2020-05-23 NOTE — BHH Counselor (Signed)
CSW contacted [facility], no beds open until next week.   Toney Reil, [place] Worker [employer]

## 2020-05-23 NOTE — Plan of Care (Signed)
I spoke to the patient's sister on the phone on 05/23/20 in the presence of social work. His sister has not changed the code to his gun safe in his home, nor has she removed the firearms from that safe. She was advised that as part of his safety planning we would advise that this be done before he returns to his residence. She offered for him to come to her home instead today as part of his safety plan. She is concerned that there is a delay in getting him into a residential rehab facility due to bed availability and expressed concern that he will relapse after discharge. I advised that he is being set up with [facility] outpatient resources as a bridge until a rehab bed is open, but I explained that he no longer appears to need inpatient, psychiatric crisis stabilization at this time. I advised that he is also requesting discharge from our facility. I discussed that he will be receiving dual diagnosis mental health treatment as well as substance abuse treatment at his [facility] program. I advised that he is not acutely detoxing at this time and has no acute SI or HI on our unit and therefore does not meet IVC criteria to be held against his will. I offered to have a family meeting with her and her brother and social work and she declined.   I met with the patient and social worker after talking to his sister. He again denies SI, HI, AVH, paranoia, or acute signs of withdrawal. He agrees to go to his sister's home as part of his safety planning and agrees to start [facility] until a residential rehab bed becomes available. He declines offer to have a family meeting with his sister prior to discharge.   Viann Fish, MD, [facility]

## 2020-05-23 NOTE — Progress Notes (Signed)
  [facility] Adult Case Management Discharge Plan :  Will you be returning to the same living situation after discharge:  No. Sister's home At discharge, do you have transportation home?: Yes,  via sister Do you have the ability to pay for your medications: Yes,  income  Release of information consent forms completed and in the chart;  Patient's signature needed at discharge.  Patient to Follow up at:  [facility].   Why: Please call this facility daily to check on open beds.  Contact information: [place] [address] [phone]        [facility]. Go on 05/27/2020.   Specialty: Family Medicine Why: You have a hospital follow up appointment for primary care services on 05/27/20 at 10:45 am.   This appointment will be held in person.  Please inquire about receiving a neurological referral. Contact information: [address] [place] [id number] [phone]        [facility].   Why: Please call this facility daily to check on open beds.  Contact information: [address] [place] [address] [phone]        [facility]. Go on 05/27/2020.   Specialty: Behavioral Health Why: You have an appointment for therapy services on 05/27/20 at 7:45 am.  You also have an appointment for medication management on 06/21/20 at 7:45 am.  These appointments will be held in person and are first come, first served, for this provider. Contact information: [place] [phone]              Next level of care provider has access to [place] and Suicide Prevention discussed: Yes,  w/ sister  Have you used any form of tobacco in the last 30 days? (Cigarettes, Smokeless Tobacco, Cigars, and/or Pipes): Yes  Has patient been referred to the  Quitline?: Patient refused referral  Patient has been referred for addiction treatment: Yes  Mliss Fritz, [place] 05/23/2020, 2:17 PM

## 2020-05-23 NOTE — BHH Group Notes (Signed)
LCSW Group Therapy Note  05/23/2020   Type of Therapy and Topic:  Group Therapy - Healthy vs Unhealthy Coping Skills  Participation Level:  Active  Description of Group The focus of this group was to determine what unhealthy coping techniques typically are used by group members and what healthy coping techniques would be helpful in coping with various problems. Patients were guided in becoming aware of the differences between healthy and unhealthy coping techniques. Patients were asked to identify 2-3 healthy coping skills they would like to learn to use more effectively.  Therapeutic Goals 1. Patients learned that coping is what human beings do all day long to deal with various situations in their lives 2. Patients defined and discussed healthy vs unhealthy coping techniques 3. Patients identified their preferred coping techniques and identified whether these were healthy or unhealthy 4. Patients determined 2-3 healthy coping skills they would like to become more familiar with and use more often. 5. Patients provided support and ideas to each other   Summary of Patient Progress:  During group, pt shared that he likes to fish, play golf and go outside for coping skills. Patient proved open to input from peers and feedback from [place]. Patient demonstrated  insight into the subject matter, was respectful of peers, and participated throughout the entire session.   Therapeutic Modalities Cognitive Behavioral Therapy Motivational Interviewing  Brandon Miller 05/23/2020  3:06 PM

## 2020-05-23 NOTE — BHH Group Notes (Signed)
Occupational Therapy Group Note Date: 05/23/2020 Group Topic/Focus: Brain Fitness  Group Description: Group encouraged increased social engagement and participation through discussion/activity focused on brain fitness. Patients were provided education on various brain fitness activities/strategies, with explanation provided on the qualifying factors including: one, that is has to be challenging/hard and two, it has to be something that you do not do every day. Patients engaged actively during group session in various brain fitness activities to increase attention, concentration, and problem-solving skills. Discussion followed with a focus on identifying the benefits of brain fitness activities as use for adaptive coping strategies and distraction.    Therapeutic Goal(s): Identify benefit(s) of brain fitness activities as use for adaptive coping and healthy distraction. Identify specific brain fitness activities to engage in as use for adaptive coping and healthy distraction. Participation Level: Active   Participation Quality: Independent   Behavior: Calm and Cooperative   Speech/Thought Process: Focused   Affect/Mood: Euthymic   Insight: Fair   Judgement: Fair   Individualization: Brandon Miller was active in their participation of group discussion/activity. Pt appeared receptive to information/education received on brain fitness and identified benefit of use as coping skill/distraction.   Modes of Intervention: Activity, Discussion, Education and Problem-solving  Patient Response to Interventions:  Attentive, Engaged, Receptive and Interested   Plan: Continue to engage patient in OT groups 2 - 3x/week.  05/23/2020  Ponciano Ort, MOT, OTR/L

## 2020-05-23 NOTE — BHH Group Notes (Signed)
Adult Psychoeducational Group Note  Date:  05/23/2020 Time:  10:19 AM  Group Topic/Focus:  Goals Group:   The focus of this group is to help patients establish daily goals to achieve during treatment and discuss how the patient can incorporate goal setting into their daily lives to aide in recovery.  Participation Level:  Active  Participation Quality:  Appropriate  Affect:  Appropriate  Cognitive:  Appropriate  Insight: Appropriate  Engagement in Group:  Engaged  Modes of Intervention:  Discussion  Additional Comments:  Patient attended morning goals and stress management group and participated.  Shadow Schedler W [place] 0/03/4915, 10:19 AM

## 2020-05-23 NOTE — Progress Notes (Signed)
   05/22/20 2210  COVID-19 Daily Checkoff  Have you had a fever (temp > 37.80C/100F)  in the past 24 hours?  No  If you have had runny nose, nasal congestion, sneezing in the past 24 hours, has it worsened? No  COVID-19 EXPOSURE  Have you traveled outside the state in the past 14 days? No  Have you been in contact with someone with a confirmed diagnosis of COVID-19 or PUI in the past 14 days without wearing appropriate PPE? No  Have you been diagnosed with COVID-19? No   

## 2020-10-06 ENCOUNTER — Emergency Department (HOSPITAL_COMMUNITY)
Admission: EM | Admit: 2020-10-06 | Discharge: 2020-10-07 | Disposition: A | Discharge status: Other Acute Inpatient Hospital | Payer: BLUE CROSS/BLUE SHIELD | Source: Home / Self Care | Attending: Emergency Medicine | Admitting: Emergency Medicine

## 2020-10-06 ENCOUNTER — Other Ambulatory Visit: Payer: Self-pay

## 2020-10-06 DIAGNOSIS — E119 Type 2 diabetes mellitus without complications: Secondary | ICD-10-CM | POA: Insufficient documentation

## 2020-10-06 DIAGNOSIS — F17210 Nicotine dependence, cigarettes, uncomplicated: Secondary | ICD-10-CM | POA: Insufficient documentation

## 2020-10-06 DIAGNOSIS — Z046 Encounter for general psychiatric examination, requested by authority: Secondary | ICD-10-CM | POA: Insufficient documentation

## 2020-10-06 DIAGNOSIS — Z20822 Contact with and (suspected) exposure to covid-19: Secondary | ICD-10-CM | POA: Insufficient documentation

## 2020-10-06 DIAGNOSIS — Z79899 Other long term (current) drug therapy: Secondary | ICD-10-CM | POA: Insufficient documentation

## 2020-10-06 DIAGNOSIS — Z96651 Presence of right artificial knee joint: Secondary | ICD-10-CM | POA: Insufficient documentation

## 2020-10-06 DIAGNOSIS — R45851 Suicidal ideations: Secondary | ICD-10-CM | POA: Insufficient documentation

## 2020-10-06 DIAGNOSIS — F1024 Alcohol dependence with alcohol-induced mood disorder: Secondary | ICD-10-CM | POA: Diagnosis not present

## 2020-10-06 DIAGNOSIS — F1914 Other psychoactive substance abuse with psychoactive substance-induced mood disorder: Secondary | ICD-10-CM | POA: Insufficient documentation

## 2020-10-06 DIAGNOSIS — Y908 Blood alcohol level of 240 mg/100 ml or more: Secondary | ICD-10-CM | POA: Insufficient documentation

## 2020-10-06 LAB — COMPREHENSIVE METABOLIC PANEL
ALT: 45 U/L — ABNORMAL HIGH (ref 0–44)
AST: 84 U/L — ABNORMAL HIGH (ref 15–41)
Albumin: 4.1 g/dL (ref 3.5–5.0)
Alkaline Phosphatase: 73 U/L (ref 38–126)
Anion gap: 13 (ref 5–15)
BUN: 7 mg/dL (ref 6–20)
CO2: 22 mmol/L (ref 22–32)
Calcium: 8.7 mg/dL — ABNORMAL LOW (ref 8.9–10.3)
Chloride: 97 mmol/L — ABNORMAL LOW (ref 98–111)
Creatinine, Ser: 0.91 mg/dL (ref 0.61–1.24)
GFR, Estimated: >60 mL/min (ref >60)
Glucose, Bld: 69 mg/dL — ABNORMAL LOW (ref 70–99)
Potassium: 3.9 mmol/L (ref 3.5–5.1)
Sodium: 132 mmol/L — ABNORMAL LOW (ref 135–145)
Total Bilirubin: 2 mg/dL — ABNORMAL HIGH (ref 0.3–1.2)
Total Protein: 7.4 g/dL (ref 6.5–8.1)

## 2020-10-06 LAB — CBC WITH DIFFERENTIAL/PLATELET
Abs Immature Granulocytes: 0.02 10*3/uL (ref 0.00–0.07)
Basophils Absolute: 0.1 10*3/uL (ref 0.0–0.1)
Basophils Relative: 1 %
Eosinophils Absolute: 0.2 10*3/uL (ref 0.0–0.5)
Eosinophils Relative: 4 %
HCT: 45.6 % (ref 39.0–52.0)
Hemoglobin: 16 g/dL (ref 13.0–17.0)
Immature Granulocytes: 0 %
Lymphocytes Relative: 41 %
Lymphs Abs: 2.5 10*3/uL (ref 0.7–4.0)
MCH: 31.9 pg (ref 26.0–34.0)
MCHC: 35.1 g/dL (ref 30.0–36.0)
MCV: 90.8 fL (ref 80.0–100.0)
Monocytes Absolute: 0.6 10*3/uL (ref 0.1–1.0)
Monocytes Relative: 10 %
Neutro Abs: 2.7 10*3/uL (ref 1.7–7.7)
Neutrophils Relative %: 44 %
Platelets: 100 10*3/uL — ABNORMAL LOW (ref 150–400)
RBC: 5.02 MIL/uL (ref 4.22–5.81)
RDW: 13.3 % (ref 11.5–15.5)
WBC: 6.2 10*3/uL (ref 4.0–10.5)
nRBC: 0 % (ref 0.0–0.2)

## 2020-10-06 LAB — SALICYLATE LEVEL: Salicylate Lvl: <7 mg/dL — ABNORMAL LOW (ref 7.0–30.0)

## 2020-10-06 LAB — ETHANOL: Alcohol, Ethyl (B): 270 mg/dL — ABNORMAL HIGH (ref <10)

## 2020-10-06 LAB — ACETAMINOPHEN LEVEL: Acetaminophen (Tylenol), Serum: <10 ug/mL — ABNORMAL LOW (ref 10–30)

## 2020-10-06 MED ORDER — LORAZEPAM 1 MG PO TABS
1.0000 mg | ORAL_TABLET | Freq: Once | ORAL | Status: AC
  Administered 2020-10-06: 1 mg via ORAL
  Filled 2020-10-06: qty 1

## 2020-10-06 MED ORDER — NICOTINE 7 MG/24HR TD PT24
7.0000 mg | MEDICATED_PATCH | Freq: Once | TRANSDERMAL | Status: DC
  Administered 2020-10-06: 7 mg via TRANSDERMAL
  Filled 2020-10-06 (×2): qty 1

## 2020-10-06 NOTE — ED Provider Notes (Signed)
Emergency Medicine Provider Triage Evaluation Note  Brandon Miller , a 56 y.o. male  was evaluated in triage.  Pt complains of si with multiple plans. States most likely he will shoot himself. He has been drinking etoh today but denies drug use.  Review of Systems  Positive: si Negative: avh  Physical Exam  BP 113/71 (BP Location: Right Arm)   Pulse (!) 57   Temp 97.8 F (36.6 C) (Oral)   Resp 16   SpO2 98%  Gen:   Awake, no distress   Resp:  Normal effort  MSK:   Moves extremities without difficulty  Other:  Appears intoxicated  Medical Decision Making  Medically screening exam initiated at 8:32 PM.  Appropriate orders placed.  [name] was informed that the remainder of the evaluation will be completed by another provider, this initial triage assessment does not replace that evaluation, and the importance of remaining in the ED until their evaluation is complete.     Bishop Dublin 10/06/20 2042    Daleen Bo, MD 10/08/20 1151

## 2020-10-06 NOTE — ED Triage Notes (Signed)
Pt from home with suicidal ideation that has been chronically ongoing since 2016, back when patient lost job. Patient states that he feels like burden making the lingering thoughts of suicide more tempting to patient today. Patient thought he did not feel like he would see tomorrow. Patient was going to shoot himself.

## 2020-10-07 ENCOUNTER — Encounter (HOSPITAL_COMMUNITY): Payer: Self-pay | Admitting: Student

## 2020-10-07 ENCOUNTER — Inpatient Hospital Stay (HOSPITAL_COMMUNITY)
Admission: AD | Admit: 2020-10-07 | Discharge: 2020-10-17 | DRG: 897 | Disposition: A | Discharge status: Home / Self Care | Payer: BLUE CROSS/BLUE SHIELD | Attending: Emergency Medicine | Admitting: Emergency Medicine

## 2020-10-07 DIAGNOSIS — E119 Type 2 diabetes mellitus without complications: Secondary | ICD-10-CM | POA: Diagnosis present

## 2020-10-07 DIAGNOSIS — Z20822 Contact with and (suspected) exposure to covid-19: Secondary | ICD-10-CM | POA: Diagnosis present

## 2020-10-07 DIAGNOSIS — F1024 Alcohol dependence with alcohol-induced mood disorder: Secondary | ICD-10-CM | POA: Diagnosis present

## 2020-10-07 DIAGNOSIS — F10239 Alcohol dependence with withdrawal, unspecified: Secondary | ICD-10-CM | POA: Diagnosis present

## 2020-10-07 DIAGNOSIS — G4700 Insomnia, unspecified: Secondary | ICD-10-CM | POA: Diagnosis present

## 2020-10-07 DIAGNOSIS — F1020 Alcohol dependence, uncomplicated: Secondary | ICD-10-CM | POA: Diagnosis present

## 2020-10-07 DIAGNOSIS — R7989 Other specified abnormal findings of blood chemistry: Secondary | ICD-10-CM | POA: Diagnosis present

## 2020-10-07 DIAGNOSIS — R45851 Suicidal ideations: Secondary | ICD-10-CM | POA: Diagnosis present

## 2020-10-07 DIAGNOSIS — F1924 Other psychoactive substance dependence with psychoactive substance-induced mood disorder: Secondary | ICD-10-CM | POA: Diagnosis present

## 2020-10-07 DIAGNOSIS — K219 Gastro-esophageal reflux disease without esophagitis: Secondary | ICD-10-CM | POA: Diagnosis present

## 2020-10-07 DIAGNOSIS — D696 Thrombocytopenia, unspecified: Secondary | ICD-10-CM | POA: Diagnosis present

## 2020-10-07 DIAGNOSIS — Y908 Blood alcohol level of 240 mg/100 ml or more: Secondary | ICD-10-CM | POA: Diagnosis present

## 2020-10-07 DIAGNOSIS — F17210 Nicotine dependence, cigarettes, uncomplicated: Secondary | ICD-10-CM | POA: Diagnosis present

## 2020-10-07 DIAGNOSIS — Z79899 Other long term (current) drug therapy: Secondary | ICD-10-CM

## 2020-10-07 DIAGNOSIS — F1094 Alcohol use, unspecified with alcohol-induced mood disorder: Secondary | ICD-10-CM

## 2020-10-07 DIAGNOSIS — F419 Anxiety disorder, unspecified: Secondary | ICD-10-CM | POA: Diagnosis present

## 2020-10-07 DIAGNOSIS — R419 Unspecified symptoms and signs involving cognitive functions and awareness: Secondary | ICD-10-CM | POA: Diagnosis not present

## 2020-10-07 DIAGNOSIS — Z87898 Personal history of other specified conditions: Secondary | ICD-10-CM

## 2020-10-07 LAB — RAPID URINE DRUG SCREEN, HOSP PERFORMED
Amphetamines: NOT DETECTED
Barbiturates: NOT DETECTED
Benzodiazepines: NOT DETECTED
Cocaine: NOT DETECTED
Opiates: NOT DETECTED
Tetrahydrocannabinol: NOT DETECTED

## 2020-10-07 LAB — RESP PANEL BY RT-PCR (FLU A&B, COVID) ARPGX2
Influenza A by PCR: NEGATIVE
Influenza B by PCR: NEGATIVE
SARS Coronavirus 2 by RT PCR: NEGATIVE

## 2020-10-07 MED ORDER — LOPERAMIDE HCL 2 MG PO CAPS: 2.0000 mg | ORAL_CAPSULE | ORAL | Status: AC | PRN

## 2020-10-07 MED ORDER — NICOTINE 7 MG/24HR TD PT24
7.0000 mg | MEDICATED_PATCH | Freq: Once | TRANSDERMAL | Status: AC
  Administered 2020-10-07: 7 mg via TRANSDERMAL
  Filled 2020-10-07 (×2): qty 1

## 2020-10-07 MED ORDER — LORAZEPAM 1 MG PO TABS
1.0000 mg | ORAL_TABLET | Freq: Four times a day (QID) | ORAL | Status: AC
  Administered 2020-10-07 – 2020-10-08 (×4): 1 mg via ORAL
  Filled 2020-10-07 (×4): qty 1

## 2020-10-07 MED ORDER — LORAZEPAM 1 MG PO TABS
1.0000 mg | ORAL_TABLET | Freq: Every day | ORAL | Status: AC
  Administered 2020-10-11: 1 mg via ORAL
  Filled 2020-10-07: qty 1

## 2020-10-07 MED ORDER — THIAMINE HCL 100 MG PO TABS
100.0000 mg | ORAL_TABLET | Freq: Every day | ORAL | Status: DC
  Administered 2020-10-07 – 2020-10-11 (×5): 100 mg via ORAL
  Filled 2020-10-07 (×8): qty 1

## 2020-10-07 MED ORDER — ADULT MULTIVITAMIN W/MINERALS CH
1.0000 | ORAL_TABLET | Freq: Every day | ORAL | Status: DC
  Administered 2020-10-07 – 2020-10-17 (×11): 1 via ORAL
  Filled 2020-10-07 (×14): qty 1

## 2020-10-07 MED ORDER — TRAZODONE HCL 100 MG PO TABS
100.0000 mg | ORAL_TABLET | Freq: Every day | ORAL | Status: DC
  Administered 2020-10-07 – 2020-10-11 (×5): 100 mg via ORAL
  Filled 2020-10-07 (×8): qty 1

## 2020-10-07 MED ORDER — HYDROXYZINE HCL 25 MG PO TABS
25.0000 mg | ORAL_TABLET | Freq: Four times a day (QID) | ORAL | Status: AC | PRN
  Administered 2020-10-08 – 2020-10-09 (×3): 25 mg via ORAL
  Filled 2020-10-07 (×3): qty 1

## 2020-10-07 MED ORDER — PANTOPRAZOLE SODIUM 40 MG PO TBEC
40.0000 mg | DELAYED_RELEASE_TABLET | Freq: Every day | ORAL | Status: DC
  Administered 2020-10-07 – 2020-10-17 (×11): 40 mg via ORAL
  Filled 2020-10-07 (×14): qty 1

## 2020-10-07 MED ORDER — GABAPENTIN 100 MG PO CAPS
100.0000 mg | ORAL_CAPSULE | Freq: Three times a day (TID) | ORAL | Status: DC
  Administered 2020-10-07 – 2020-10-08 (×4): 100 mg via ORAL
  Filled 2020-10-07 (×11): qty 1

## 2020-10-07 MED ORDER — LORAZEPAM 1 MG PO TABS: 1.0000 mg | ORAL_TABLET | Freq: Four times a day (QID) | ORAL | Status: AC | PRN

## 2020-10-07 MED ORDER — LORAZEPAM 1 MG PO TABS
1.0000 mg | ORAL_TABLET | Freq: Three times a day (TID) | ORAL | Status: AC
  Administered 2020-10-08 – 2020-10-09 (×3): 1 mg via ORAL
  Filled 2020-10-07 (×3): qty 1

## 2020-10-07 MED ORDER — ONDANSETRON 4 MG PO TBDP: 4.0000 mg | ORAL_TABLET | Freq: Four times a day (QID) | ORAL | Status: AC | PRN

## 2020-10-07 MED ORDER — FLUOXETINE HCL 20 MG PO CAPS
20.0000 mg | ORAL_CAPSULE | Freq: Every day | ORAL | Status: DC
  Administered 2020-10-07 – 2020-10-10 (×4): 20 mg via ORAL
  Filled 2020-10-07 (×7): qty 1

## 2020-10-07 MED ORDER — LORAZEPAM 1 MG PO TABS
1.0000 mg | ORAL_TABLET | Freq: Two times a day (BID) | ORAL | Status: AC
  Administered 2020-10-09 – 2020-10-10 (×2): 1 mg via ORAL
  Filled 2020-10-07 (×2): qty 1

## 2020-10-07 MED ORDER — ALUM & MAG HYDROXIDE-SIMETH 200-200-20 MG/5ML PO SUSP: 30.0000 mL | ORAL | Status: DC | PRN

## 2020-10-07 MED ORDER — LORAZEPAM 1 MG PO TABS
1.0000 mg | ORAL_TABLET | Freq: Four times a day (QID) | ORAL | Status: DC | PRN
  Administered 2020-10-07: 1 mg via ORAL
  Filled 2020-10-07: qty 1

## 2020-10-07 MED ORDER — FOLIC ACID 1 MG PO TABS
1.0000 mg | ORAL_TABLET | Freq: Every day | ORAL | Status: DC
  Administered 2020-10-07 – 2020-10-11 (×5): 1 mg via ORAL
  Filled 2020-10-07 (×8): qty 1

## 2020-10-07 MED ORDER — MAGNESIUM HYDROXIDE 400 MG/5ML PO SUSP
30.0000 mL | Freq: Every day | ORAL | Status: DC | PRN
Start: 2020-10-07 — End: 2020-10-17

## 2020-10-07 NOTE — ED Notes (Signed)
Belongings inventoried and placed in Therapist, art and wallet with security

## 2020-10-07 NOTE — Group Note (Signed)
LCSW Group Therapy Notes   Type of Therapy and Topic: Group Therapy: Worry and Anxiety  Participation Level: BHH PARTICIPATION LEVEL: Did not attend  Description of Group: In this group, patients will be encouraged to explore their worry around what could happen vs what will happen. Each patient will be challenged to think of personal worries and how they will work their way through that worry around what will happen and what could happen. This group will be process-oriented, with patients participating in exploration of their own experiences as well as giving and receiving support and challenge from other group members.  Therapeutic Goals: Patient will identify personal worries that cause anxiety. Patient will identify clues to identify their worry. Patient will identify ways to handle their worry. Patient will discuss ways that their worry has deceased or why it has not decreased.   Summary of Patient Progress: Did not attend   Therapeutic Modalities:  Cognitive Behavioral Therapy Solution Focused Therapy Motivational Interviewing    Jarrett Chicoine, LCSW, [facility]

## 2020-10-07 NOTE — Tx Team (Signed)
Initial Treatment Plan 10/07/2020 11:34 AM Brandon Miller [id number]    PATIENT STRESSORS: Financial difficulties   Health problems   Occupational concerns   Substance abuse     PATIENT STRENGTHS: Capable of independent living  Motivation for treatment/growth    PATIENT IDENTIFIED PROBLEMS: Depression  Anxiety  Unemployed   "The proazc isn't working for me"  Suicidal ideation             DISCHARGE CRITERIA:  Improved stabilization in mood, thinking, and/or behavior Motivation to continue treatment in a less acute level of care Reduction of life-threatening or endangering symptoms to within safe limits Verbal commitment to aftercare and medication compliance Withdrawal symptoms are absent or subacute and managed without 24-hour nursing intervention  PRELIMINARY DISCHARGE PLAN: Attend 12-step recovery group Outpatient therapy Return to previous living arrangement Resources for employment  PATIENT/FAMILY INVOLVEMENT: This treatment plan has been presented to and reviewed with the patient, Brandon Miller.  The patient has been given the opportunity to ask questions and make suggestions.  Gerrianne Scale, RN 10/07/2020, 11:34 AM

## 2020-10-07 NOTE — BH Assessment (Signed)
Clinician added Dr. Stark Jock and Brandon Miller. Cobb, RN to secure chat discussing the pt's disposition.   *Disposition: Brandon John, PA-C recommends inpatient treatment. Per Brandon Kells, RN pt is accepting pending medical clearance and negative COVID. Clinician messaged Brandon Loll, RN pt's disposition.*  Per Dr. Stark Jock, pt has a negative COVID.   Per Brandon Kells, RN pt has been accepted to [facility] and assigned to room/bed: 303-2. Pt can come after 0800. Attending physician: Dr. Nelda Miller. Clinician sent secure chat to Dr. Stark Jock and Brandon Miller. Brandon Miller, Therapist, sports.     Brandon Miller, [place], [facility], [facility] Triage Specialist [phone]

## 2020-10-07 NOTE — H&P (Addendum)
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 toxicology negative for drugs On 4/28 -HbA1c 4.6, TSH 5.479, T4 low at 0.59, T3 uptake ratio high at 45.  We will repeat TSH  Psychotherapy: We will be encouraged to attend groups  Medications: See treatment plan summary  Consultations: None  Discharge Concerns: None  Estimated LOS: 3 to 5 days  Other:     Physician Treatment Plan for Primary Diagnosis: Alcohol-induced mood disorder with depressive symptoms ([place]) Long Term Goal(s): Improvement in symptoms so as ready for discharge  Short Term Goals: Ability to identify changes in lifestyle to reduce recurrence of condition will  improve, Ability to verbalize feelings will improve, Ability to disclose and discuss suicidal ideas, Ability to demonstrate self-control will improve, Ability to identify and develop effective coping behaviors will improve, Ability to maintain clinical measurements within normal limits will improve, Compliance with prescribed medications will improve, and Ability to identify triggers associated with substance abuse/mental health issues will improve  Physician Treatment Plan for Secondary Diagnosis: Principal Problem:   Alcohol-induced mood disorder with depressive symptoms ([place]) Active Problems:   Diabetes mellitus, type 2 ([place])   Elevated LFTs   Alcohol dependence ([place])   GERD (gastroesophageal reflux disease)   History of seizures  Long Term Goal(s): Improvement in symptoms so as ready for discharge  Short Term Goals: Ability to identify changes in lifestyle to reduce recurrence of condition will improve, Ability to verbalize feelings will improve, Ability to disclose and discuss suicidal ideas, Ability to demonstrate self-control will improve, Ability to identify and develop effective coping behaviors will improve, Ability to maintain clinical measurements within normal limits will improve, Compliance with prescribed medications will improve, and Ability to identify triggers associated with substance abuse/mental health issues will improve  I certify that inpatient services furnished can reasonably be expected to improve the patient's condition.    Armando Reichert, MD 9/16/20221:20 PM

## 2020-10-07 NOTE — BH Assessment (Signed)
Demetrios Loll, RN to find a private room for the pt to be assessed. Clinician asked RN to messaged her once the pt is ready, RN to see if pt is under IVC.    Vertell Novak, [place], [facility], [facility] Triage Specialist [phone]

## 2020-10-07 NOTE — Progress Notes (Signed)
The patient is a 55 year old white male who voluntarily came in for SI and substance use (alcohol). The patient complained of depression and anxiety due to being unemployed and "bills piling up." Stated that he was not having SI thoughts currently. Patient is calm and cooperative. Patient says his goals are to decrease anxiety and depression, as well as change his medications. Patient stated "Prozac isn't working for me." RN oriented patient to the unit and initiated q 15 min safety checks. RN provided reassurance and assessed for needs and concerns. RN provided medication, Ativan, for his CIWA score of 12. RN will monitor and intervene as needed.

## 2020-10-07 NOTE — ED Provider Notes (Signed)
  9:01 AM Notified by nursing personal, pt has [facility] bed available. Will DC from ED.   Campbell Stall, DO.   Lianne Cure, DO [id number] [phone]

## 2020-10-07 NOTE — ED Provider Notes (Signed)
[facility] EMERGENCY DEPARTMENT Provider Note   CSN: [id number] Arrival date & time: 10/06/20  1940     History Chief Complaint  Patient presents with   Suicidal    Suicidal Ideation     Brandon Miller is a 55 y.o. male.  Patient is a 55 year old male with history of type 2 diabetes, depression.  Patient presenting today for evaluation of suicidal ideation.  Patient cites multiple stressors including loss of employment and relationship problems.  He describes a desire to shoot himself if he goes home.  Patient does admit to alcohol use earlier today.  The history is provided by the patient.      Past Medical History:  Diagnosis Date   Allergy    Anemia    Anxiety    Arthritis    RA   Complication of anesthesia    hard to wake up-was told he may have sleep apnea-never tested   Depression    Diabetes mellitus without complication ([place])    with weight no more problems   Seizure ([place])    had one seizure of unknown etiology a few tears ago, none since and on no meds for them   Sleep apnea    Snores     Patient Active Problem List   Diagnosis Date Noted   Abnormal thyroid screen (blood) 05/23/2020   Thrombocytopenia ([place]) 05/23/2020   History of seizures 05/18/2020   Alcohol-induced mood disorder with depressive symptoms ([place]) 05/18/2020   Substance induced mood disorder ([place]) 05/17/2020   Falls 08/10/2019   Numbness and tingling of upper and lower extremities of both sides 08/10/2019   Balance problem 08/10/2019   Medication side effect 08/10/2019   Alcohol withdrawal ([place]) 08/10/2019   GERD (gastroesophageal reflux disease) 02/06/2019   Attention deficit hyperactivity disorder (ADHD) 11/17/2018   Tobacco dependence 11/17/2018   Alcohol use disorder, severe, dependence ([place]) 11/17/2018   History of diet-controlled diabetes 11/17/2018   Elevated LFTs 11/05/2018   Acute gastroenteritis 10/31/2018   Sleep apnea    Generalized abdominal pain     Non-intractable vomiting    Diarrhea    ETOH abuse    Ileus, unspecified ([place]) 10/29/2018   Severe recurrent major depression without psychotic features ([place]) 08/15/2018   Alcohol withdrawal seizure with perceptual disturbance ([place])    Alcohol abuse with unspecified alcohol-induced disorder ([place]) 02/05/2018   Transaminasemia 02/05/2018   Anxiety and depression    Alcohol withdrawal seizure without complication ([place]) [id number]   DJD (degenerative joint disease) of knee 08/25/2014   Diabetes mellitus, type 2 ([place]) 10/31/2011   Hyperglycemia 10/31/2011   PNA (pneumonia) 10/30/2011   LEG PAIN, RIGHT 01/20/2010    Past Surgical History:  Procedure Laterality Date   BIOPSY  02/12/2019   Procedure: BIOPSY;  Surgeon: Daneil Dolin, MD;  [facility];  Service: Endoscopy;;  stomach   COLONOSCOPY WITH PROPOFOL N/A 02/12/2019   Procedure: COLONOSCOPY WITH PROPOFOL;  Surgeon: Daneil Dolin, MD;  [facility];  Service: Endoscopy;  Laterality: N/A;  10:30am   ELBOW SURGERY Right 2010   Dr. Percell Miller   ESOPHAGOGASTRODUODENOSCOPY (EGD) WITH PROPOFOL N/A 02/12/2019   Procedure: ESOPHAGOGASTRODUODENOSCOPY (EGD) WITH PROPOFOL;  Surgeon: Daneil Dolin, MD;  [facility];  Service: Endoscopy;  Laterality: N/A;   HERNIA REPAIR     Right inguinal   KNEE ARTHROSCOPY WITH MEDIAL MENISECTOMY Right 06/11/2013   Procedure: RIGHT KNEE ARTHROSCOPY WITH MEDIAL MENISECTOMY AND CHONDROPLASTY;  Surgeon: Quillian Quince  Dennie Bible, MD;  Location: [place];  Service: Orthopedics;  Laterality: Right;  Chondroplasty, loose bodies, medial menisectomy, lysis of adhesions.   KNEE SURGERY Right 2011   POLYPECTOMY  02/12/2019   Procedure: POLYPECTOMY;  Surgeon: Daneil Dolin, MD;  [facility];  Service: Endoscopy;;   right ankle     SHOULDER SURGERY Right 2010   Dr. Percell Miller   TOTAL KNEE ARTHROPLASTY Right 08/25/2014   Procedure: TOTAL KNEE ARTHROPLASTY;  Surgeon:  Ninetta Lights, MD;  Location: [place];  Service: Orthopedics;  Laterality: Right;       Family History  Problem Relation Age of Onset   Hepatitis Mother    Cirrhosis Mother        Did not drink   Pneumonia Father    Kidney failure Father    Gastric cancer Brother        ? Gastric CA spread to esophagus   Colon cancer Neg Hx     Social History   Tobacco Use   Smoking status: Every Day    Packs/day: 1.50    Years: 25.00    Pack years: 37.50    Types: Cigarettes   Smokeless tobacco: Never  Vaping Use   Vaping Use: Never used  Substance Use Topics   Alcohol use: Yes    Comment: 10-20/day cans beer   Drug use: No    Home Medications Prior to Admission medications   Medication Sig Start Date End Date Taking? Authorizing Provider  amphetamine-dextroamphetamine (ADDERALL) 20 MG tablet Take 20 mg by mouth 2 (two) times daily. 09/14/20  Yes [provider]  FLUoxetine (PROZAC) 20 MG capsule Take 1 capsule (20 mg total) by mouth daily. 05/23/20 11/19/20 Yes Ethelene Hal, NP  ibuprofen (ADVIL) 200 MG tablet Take 400 mg by mouth every 6 (six) hours as needed for headache or moderate pain.   Yes [provider]  Naphazoline HCl (CLEAR EYES OP) Apply 1 drop to eye daily as needed (dry red eyes).   Yes [provider]  traZODone (DESYREL) 100 MG tablet Take 1 tablet (100 mg total) by mouth at bedtime as needed for sleep. Patient taking differently: Take 100 mg by mouth at bedtime. 05/23/20  Yes Ethelene Hal, NP  levETIRAcetam (KEPPRA) 500 MG tablet Take 1 tablet (500 mg total) by mouth 2 (two) times daily. Patient not taking: No sig reported 05/23/20   Ethelene Hal, NP  levothyroxine (SYNTHROID) 25 MCG tablet Take 1 tablet (25 mcg total) by mouth daily at 6 (six) AM. Patient not taking: No sig reported 05/24/20   Ethelene Hal, NP  Multiple Vitamin (MULTIVITAMIN WITH MINERALS) TABS tablet Take 1 tablet by mouth daily. Patient not  taking: No sig reported 05/18/20   Ival Bible, MD    Allergies    Daypro [oxaprozin], Diclofenac sodium, Indocin [indomethacin], and Zanaflex [tizanidine hcl]  Review of Systems   Review of Systems  All other systems reviewed and are negative.  Physical Exam Updated Vital Signs BP 135/69   Pulse 67   Temp 98 F (36.7 C) (Oral)   Resp 20   SpO2 100%   Physical Exam Vitals and nursing note reviewed.  Constitutional:      General: He is not in acute distress.    Appearance: He is well-developed. He is not diaphoretic.  HENT:     Head: Normocephalic and atraumatic.  Cardiovascular:     Rate and Rhythm: Normal rate and regular  rhythm.     Heart sounds: No murmur heard.   No friction rub.  Pulmonary:     Effort: Pulmonary effort is normal. No respiratory distress.     Breath sounds: Normal breath sounds. No wheezing or rales.  Abdominal:     General: Bowel sounds are normal. There is no distension.     Palpations: Abdomen is soft.     Tenderness: There is no abdominal tenderness.  Musculoskeletal:        General: Normal range of motion.     Cervical back: Normal range of motion and neck supple.  Skin:    General: Skin is warm and dry.  Neurological:     Mental Status: He is alert and oriented to person, place, and time.     Coordination: Coordination normal.  Psychiatric:        Attention and Perception: Attention normal.        Mood and Affect: Affect is flat.        Speech: Speech normal.        Behavior: Behavior normal.        Thought Content: Thought content includes suicidal ideation. Thought content does not include homicidal ideation. Thought content includes suicidal plan. Thought content does not include homicidal plan.        Cognition and Memory: Cognition and memory normal.        Judgment: Judgment is impulsive.    ED Results / Procedures / Treatments   Labs (all labs ordered are listed, but only abnormal results are displayed) Labs Reviewed   COMPREHENSIVE METABOLIC PANEL - Abnormal; Notable for the following components:      Result Value   Sodium 132 (*)    Chloride 97 (*)    Glucose, Bld 69 (*)    Calcium 8.7 (*)    AST 84 (*)    ALT 45 (*)    Total Bilirubin 2.0 (*)    All other components within normal limits  CBC WITH DIFFERENTIAL/PLATELET - Abnormal; Notable for the following components:   Platelets 100 (*)    All other components within normal limits  ETHANOL - Abnormal; Notable for the following components:   Alcohol, Ethyl (B) 270 (*)    All other components within normal limits  ACETAMINOPHEN LEVEL - Abnormal; Notable for the following components:   Acetaminophen (Tylenol), Serum <10 (*)    All other components within normal limits  SALICYLATE LEVEL - Abnormal; Notable for the following components:   Salicylate Lvl [id number] (*)    All other components within normal limits  RESP PANEL BY RT-PCR (FLU A&B, COVID) ARPGX2  RAPID URINE DRUG SCREEN, HOSP PERFORMED    EKG None  Radiology No results found.  Procedures Procedures   Medications Ordered in ED Medications  nicotine (NICODERM CQ - dosed in mg/24 hr) patch 7 mg (7 mg Transdermal Patch Applied 10/06/20 2230)  LORazepam (ATIVAN) tablet 1 mg (1 mg Oral Given 10/06/20 2102)    ED Course  I have reviewed the triage vital signs and the nursing notes.  Pertinent labs & imaging results that were available during my care of the patient were reviewed by me and considered in my medical decision making (see chart for details).    MDM Rules/Calculators/A&P  Patient presenting with suicidal ideation with plan.  He will be evaluated by TTS who will assist in determining the final disposition.  Final Clinical Impression(s) / ED Diagnoses Final diagnoses:  None    Rx / DC Orders  ED Discharge Orders     None        Veryl Speak, MD 10/07/20 [phone]

## 2020-10-07 NOTE — Discharge Instructions (Addendum)
Transfer to [facility] 

## 2020-10-07 NOTE — [facility] Suicide Risk Assessment (Addendum)
[facility] Admission Suicide Risk Assessment   Nursing information obtained from:   patient Demographic factors:  Male, Living alone, Unemployed Current Mental Status:  Suicidal ideation indicated by patient Loss Factors:  Financial problems / change in socioeconomic status Historical Factors:  Impulsivity Risk Reduction Factors:  previous response to medications/treatment  Total Time Spent in Direct Patient Care:  I personally spent 40 minutes on the unit in direct patient care. The direct patient care time included face-to-face time with the patient, reviewing the patient's chart, communicating with other professionals, and coordinating care. Greater than 50% of this time was spent in counseling or coordinating care with the patient regarding goals of hospitalization, psycho-education, and discharge planning needs.  Principal Problem: Alcohol-induced mood disorder with depressive symptoms ([place]) Diagnosis:  Principal Problem:   Alcohol-induced mood disorder with depressive symptoms ([place]) Active Problems:   Diabetes mellitus, type 2 (H[facility])   Elevated LFTs   Alcohol dependence ([place])   GERD (gastroesophageal reflux disease)   History of seizures  Subjective Data: The patient is a 55y/o male with past history of diabetes mellitus, depression, and alcohol dependence who initially presented to [facility] ED for evaluation of suicidal ideation with a plan to shoot himself in the context of alcohol relapse. Patient cited multiple stressors including loss of employment and relationship problems. The patient was admitted voluntarily to [facility] for continued stabilization.   The patient was admitted to [facility] for similar complaints in April 2022 at which time he was discharged home on Prozac '20mg'$  daily, Keppra '500mg'$  bid, Synthroid 54mg daily, and Trazodone '100mg'$  qhs PRN insomnia. He states that after his hospital discharge [place] He is vague as to when he  relapsed with alcohol but estimates that he has been drinking 18-24 beers daily for the last 5-6 weeks. He states he has felt depressed with associated insomnia, low energy, poor focus, and low appetite. He is vague when questioned about compliance with his antidepressant prior to admission. He denies current SI intent or plan and denies previous suicide attempt. He denies AVH, parnoia, or delusions. He denies h/o mania. He states he has a diagnosis of post-concussive syndrome from prior TBI but is unclear if he was compliant with Keppra after discharge. He states his last seizure was over 2 years ago. He denies other illicit drug use. See H&P for additional details.   Continued Clinical Symptoms:  Alcohol Use Disorder Identification Test Final Score (AUDIT): 32 The "Alcohol Use Disorders Identification Test", Guidelines for Use in Primary Care, Second Edition.  World HPharmacologist([facility]. Score between 0-7:  no or low risk or alcohol related problems. Score between 8-15:  moderate risk of alcohol related problems. Score between 16-19:  high risk of alcohol related problems. Score 20 or above:  warrants further diagnostic evaluation for alcohol dependence and treatment.  CLINICAL FACTORS:   Depression:   Anhedonia Hopelessness Impulsivity Insomnia Severe Alcohol/Substance Abuse/Dependencies Previous Psychiatric Diagnoses and Treatments  Musculoskeletal: Strength & Muscle Tone: untested in bed Gait & Station: untested in bed Patient leans: N/A  Psychiatric Specialty Exam: Physical Exam Vitals reviewed.  HENT:     Head: Normocephalic.  Pulmonary:     Effort: Pulmonary effort is normal.  Neurological:     General: No focal deficit present.     Mental Status: He is alert.    Review of Systems  Constitutional:  Negative for fever.  Eyes:  Negative for visual disturbance.  Respiratory:  Negative for shortness of breath.   Cardiovascular:  Negative for chest pain.   Gastrointestinal:  Negative for diarrhea, nausea and vomiting.  Genitourinary:  Negative for difficulty urinating.  Skin:  Negative for rash.  Neurological:  Negative for headaches.   Blood pressure 133/90, pulse 73, temperature 97.7 F (36.5 C), temperature source Oral, resp. rate 16, height '6\' 3"'$  (1.905 m), weight 90.7 kg, SpO2 97 %.Body mass index is 25 kg/m.  General Appearance:  casually dressed, fair hygiene  Eye Contact:  Fair  Speech:  Clear and Coherent and Normal Rate  Volume:  Normal  Mood:  Dysphoric  Affect:  Constricted  Thought Process:  Goal Directed and Linear  Orientation:  Full (Time, Place, and Person)  Thought Content:  Logical and denies AVH, paranoia, or delusions  Suicidal Thoughts:  No  Homicidal Thoughts:  No  Memory:  Recent;   Fair  Judgement:  Fair  Insight:  Fair  Psychomotor Activity:  Normal  Concentration:  Concentration: Fair and Attention Span: Fair  Recall:  [employer] of Knowledge:  Good  Language:  Good  Akathisia:  Negative  Assets:  Communication Skills Desire for Improvement Housing Resilience Social Support  ADL's:  Intact  Cognition:  WNL   COGNITIVE FEATURES THAT CONTRIBUTE TO RISK:  None    SUICIDE RISK:   Moderate:  Frequent suicidal ideation with limited intensity, and duration, some specificity in terms of plans, no associated intent, good self-control, limited dysphoria/symptomatology, some risk factors present, and identifiable protective factors, including available and accessible social support.  PLAN OF CARE: Patient admitted voluntarily to [facility]. Admission labs reviewed: TSH, Lipid, A1c pending; Respiratory panel negative; Salicylate <7, Tylenol <10, ETOH 270; CMP WNL except for Na+ 132, Cl 97, glucose 69, Ca+ 8.7, AST 84 and ALT 45 (Down from AST 87 and ALT 82 4 months ago), and total bili 2.0 (chronically elevated and as high as 2.0 4 months ago) UDS negative; Acute hepatitis panel negative in April 2022.CBC WNL except  for platelets 100 (was 126 4 months ago and as low as 86 1 year ago)  Patient has been placed on scheduled Ativan taper with CIWA and additional Ativan '1mg'$  for score >10 with MVI and thiamine oral replacement. He will be started on Neurontin '100mg'$  tid to help with potential cravings/seizures and we will attempt to clarify his outpatient seizure medication regimen with the patient. He is presently on Prozac '20mg'$  daily which he states he wishes to continue. He was encouraged to consider residential rehab or [facility] after discharge. We will repeat CMP for monitoring of LFTs and Na+ and repeat CBC for trending of platelets.  I certify that inpatient services furnished can reasonably be expected to improve the patient's condition.   Harlow Asa, MD, FAPA 10/07/2020, 5:36 PM
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
## 2020-10-07 NOTE — BHH Group Notes (Signed)
Adult Psychoeducational Group Note  Date:  10/07/2020 Time:  11:35 AM  Group Topic/Focus:  Goals Group:   The focus of this group is to help patients establish daily goals to achieve during treatment and discuss how the patient can incorporate goal setting into their daily lives to aide in recovery.  Participation Level:  Did Not Attend Brandon Miller 10/07/2020, 11:35 AM

## 2020-10-07 NOTE — ED Notes (Signed)
Pt signed voluntary consent form and is accepting of the ride to [facility]

## 2020-10-08 DIAGNOSIS — F1094 Alcohol use, unspecified with alcohol-induced mood disorder: Secondary | ICD-10-CM | POA: Diagnosis not present

## 2020-10-08 LAB — CBC WITH DIFFERENTIAL/PLATELET
Abs Immature Granulocytes: 0.01 10*3/uL (ref 0.00–0.07)
Basophils Absolute: 0.1 10*3/uL (ref 0.0–0.1)
Basophils Relative: 1 %
Eosinophils Absolute: 0.2 10*3/uL (ref 0.0–0.5)
Eosinophils Relative: 3 %
HCT: 47.9 % (ref 39.0–52.0)
Hemoglobin: 16.2 g/dL (ref 13.0–17.0)
Immature Granulocytes: 0 %
Lymphocytes Relative: 34 %
Lymphs Abs: 1.8 10*3/uL (ref 0.7–4.0)
MCH: 31.5 pg (ref 26.0–34.0)
MCHC: 33.8 g/dL (ref 30.0–36.0)
MCV: 93.2 fL (ref 80.0–100.0)
Monocytes Absolute: 0.5 10*3/uL (ref 0.1–1.0)
Monocytes Relative: 10 %
Neutro Abs: 2.7 10*3/uL (ref 1.7–7.7)
Neutrophils Relative %: 52 %
Platelets: 100 10*3/uL — ABNORMAL LOW (ref 150–400)
RBC: 5.14 MIL/uL (ref 4.22–5.81)
RDW: 13.5 % (ref 11.5–15.5)
WBC: 5.3 10*3/uL (ref 4.0–10.5)
nRBC: 0.6 % — ABNORMAL HIGH (ref 0.0–0.2)

## 2020-10-08 LAB — COMPREHENSIVE METABOLIC PANEL
ALT: 48 U/L — ABNORMAL HIGH (ref 0–44)
AST: 108 U/L — ABNORMAL HIGH (ref 15–41)
Albumin: 4.3 g/dL (ref 3.5–5.0)
Alkaline Phosphatase: 96 U/L (ref 38–126)
Anion gap: 9 (ref 5–15)
BUN: 13 mg/dL (ref 6–20)
CO2: 28 mmol/L (ref 22–32)
Calcium: 9.3 mg/dL (ref 8.9–10.3)
Chloride: 102 mmol/L (ref 98–111)
Creatinine, Ser: 0.89 mg/dL (ref 0.61–1.24)
GFR, Estimated: >60 mL/min (ref >60)
Glucose, Bld: 77 mg/dL (ref 70–99)
Potassium: 4 mmol/L (ref 3.5–5.1)
Sodium: 139 mmol/L (ref 135–145)
Total Bilirubin: 2.7 mg/dL — ABNORMAL HIGH (ref 0.3–1.2)
Total Protein: 7.6 g/dL (ref 6.5–8.1)

## 2020-10-08 LAB — LIPID PANEL
Cholesterol: 122 mg/dL (ref 0–200)
HDL: 61 mg/dL (ref >40)
LDL Cholesterol: 49 mg/dL (ref 0–99)
Total CHOL/HDL Ratio: 2 RATIO
Triglycerides: 62 mg/dL (ref <150)
VLDL: 12 mg/dL (ref 0–40)

## 2020-10-08 LAB — TSH: TSH: 4.046 u[IU]/mL (ref 0.350–4.500)

## 2020-10-08 LAB — HEMOGLOBIN A1C
Hgb A1c MFr Bld: 4.3 % — ABNORMAL LOW (ref 4.8–5.6)
Mean Plasma Glucose: 76.71 mg/dL

## 2020-10-08 MED ORDER — GABAPENTIN 100 MG PO CAPS
200.0000 mg | ORAL_CAPSULE | Freq: Three times a day (TID) | ORAL | Status: DC
  Administered 2020-10-09: 200 mg via ORAL
  Filled 2020-10-08 (×7): qty 2

## 2020-10-08 NOTE — Group Note (Signed)
LCSW Group Therapy Note  10/08/2020  10-11am  Type of Therapy and Topic:  Group Therapy: "My Mental Health"  Participation Level:  Did Not Attend   Description of Group:   In this group, patients were asked four questions in order to generate discussion around the idea of mental illness and/or substance addiction being medical problems: In one sentence describe the current state of your mental health or substance use. How much do you feel similar to or different from others? Do you tend to identify with other people or compare yourself to them?  In a word or sentence, share what you desire your mental health or substance use to be moving forward.  Discussion was held that led to the conclusion that comparing ourselves to others is not healthy, but identifying with the elements of their issues that are similar to ours is helpful.    Therapeutic Goals:  Patients will identify their feelings about their current mental health/substance use problems. Patients will describe how they feel similar to or different from others, and whether they tend to identify with or compare themselves to other people with the same issues. Patients will explore the differences in these concepts and how a change of mindset about mental health/substance use can help with reaching recovery goals. Patients will think about and share what their recovery goals are, in terms of mental health and/or substance use.  Summary of Patient Progress:  The patient was invited to group, did not attend  Therapeutic Modalities:   Processing Psychoeducation  Maretta Los, MSW, LCSW

## 2020-10-08 NOTE — Progress Notes (Signed)
Adult Psychoeducational Group Note  Date:  10/08/2020 Time:  10:09 AM  Group Topic/Focus:  Goals Group:   The focus of this group is to help patients establish daily goals to achieve during treatment and discuss how the patient can incorporate goal setting into their daily lives to aide in recovery.  Participation Level:  Did Not Attend  Brandon Miller [facility] 10/08/2020, 10:09 AM

## 2020-10-08 NOTE — Progress Notes (Addendum)
   10/07/20 2200  Psych Admission Type (Psych Patients Only)  Admission Status Voluntary  Psychosocial Assessment  Patient Complaints Substance abuse;Anxiety;Depression  Eye Contact Fair  Facial Expression Flat  Affect Depressed  Speech Logical/coherent  Interaction Minimal;Forwards little  Motor Activity Slow;Tremors  Appearance/Hygiene In scrubs  Behavior Characteristics Cooperative;Appropriate to situation  Mood Depressed;Anxious  Thought Process  Coherency WDL  Content WDL  Delusions None reported or observed  Perception WDL  Hallucination None reported or observed  Judgment Impaired  Confusion None  Danger to Self  Current suicidal ideation? Denies  Danger to Others  Danger to Others None reported or observed   Pt minimal in his interactions. Pt denies SI, HI, AVH and pain. Pt rates anxiety 5/10 and depression 7/10.  Pt endorses withdrawal symptoms: tremors, sweating and nervousness. CIWA = 5.

## 2020-10-08 NOTE — Progress Notes (Addendum)
   10/08/20 1200  Psych Admission Type (Psych Patients Only)  Admission Status Voluntary  Psychosocial Assessment  Patient Complaints Substance abuse  Eye Contact Fair  Facial Expression Flat  Affect Depressed  Speech Logical/coherent  Interaction Minimal;Forwards little  Motor Activity Slow;Tremors  Appearance/Hygiene In scrubs  Behavior Characteristics Cooperative;Appropriate to situation  Mood Depressed;Anxious  Thought Process  Coherency WDL  Content WDL  Delusions None reported or observed  Perception WDL  Hallucination None reported or observed  Judgment Impaired  Confusion None  Danger to Self  Current suicidal ideation? Denies  Danger to Others  Danger to Others None reported or observed    Pt has been calm and cooperative- but somewhat guarded during interactions- observed in the milieu throughout the shift. Pt currently denies withdrawal symptoms, SI/HI and A/VH

## 2020-10-08 NOTE — Progress Notes (Signed)
[facility] MD Progress Note  10/08/2020 6:16 PM Borna Farha  MRN:  [id number] Subjective:  Brandon Miller is a 55 year old male with a psychiatric history of alcohol use disorder-severe/dependent and depression, and a medical history of diabetes mellitus, who initially presented to [facility] for evaluation of suicidal ideation with a plan to shoot himself if he goes home.  On assessment this morning, Michai describes his mood as "okay."  He says that he has been experiencing the withdrawal symptoms of nervousness, jitteriness, anxiety, and diaphoresis.  He endorses having withdrawal seizures 2 years ago, but no other seizures outside of alcohol use.  He says that he was taking Klonopin that was stopped in May of this year (its indication and reason for discontinuation are unclear), at which time he began Synthroid and Keppra; he only took these until June 10.  He endorses sleeping "pretty good," and his appetite is intact.  He denies SI/HI/AVH, paranoia, and does not appear to be internally preoccupied.  He has been compliant with his medications, and denies any acute concerns today.  Principal Problem: Alcohol-induced mood disorder with depressive symptoms (HCC) Diagnosis: Principal Problem:   Alcohol-induced mood disorder with depressive symptoms (HCC) Active Problems:   Diabetes mellitus, type 2 (HCC)   Elevated LFTs   Alcohol dependence ([place])   GERD (gastroesophageal reflux disease)   History of seizures  Total Time spent with patient: I personally spent 35 minutes on the unit in direct patient care. The direct patient care time included face-to-face time with the patient, reviewing the patient's chart, communicating with other professionals, and coordinating care. Greater than 50% of this time was spent in counseling or coordinating care with the patient regarding goals of hospitalization, psycho-education, and discharge planning needs.   Past Psychiatric History: Alcohol dependence, elevated  LFTs, alcohol induced mood disorder. He has been complaint with his medication. He has been taking Prozac 20 mg daily.  He states he has tried Dentist  Past Medical History:  Past Medical History:  Diagnosis Date   Allergy    Anemia    Anxiety    Arthritis    RA   Complication of anesthesia    hard to wake up-was told he may have sleep apnea-never tested   Depression    Diabetes mellitus without complication ([place])    with weight no more problems   Seizure ([place])    had one seizure of unknown etiology a few tears ago, none since and on no meds for them   Sleep apnea    Snores     Past Surgical History:  Procedure Laterality Date   BIOPSY  02/12/2019   Procedure: BIOPSY;  Surgeon: Daneil Dolin, MD;  [facility];  Service: Endoscopy;;  stomach   COLONOSCOPY WITH PROPOFOL N/A 02/12/2019   Procedure: COLONOSCOPY WITH PROPOFOL;  Surgeon: Daneil Dolin, MD;  [facility];  Service: Endoscopy;  Laterality: N/A;  10:30am   ELBOW SURGERY Right 2010   Dr. Percell Miller   ESOPHAGOGASTRODUODENOSCOPY (EGD) WITH PROPOFOL N/A 02/12/2019   Procedure: ESOPHAGOGASTRODUODENOSCOPY (EGD) WITH PROPOFOL;  Surgeon: Daneil Dolin, MD;  [facility];  Service: Endoscopy;  Laterality: N/A;   HERNIA REPAIR     Right inguinal   KNEE ARTHROSCOPY WITH MEDIAL MENISECTOMY Right 06/11/2013   Procedure: RIGHT KNEE ARTHROSCOPY WITH MEDIAL MENISECTOMY AND CHONDROPLASTY;  Surgeon: Ninetta Lights, MD;  Location: [place];  Service: Orthopedics;  Laterality: Right;  Chondroplasty, loose bodies,  medial menisectomy, lysis of adhesions.   KNEE SURGERY Right 2011   POLYPECTOMY  02/12/2019   Procedure: POLYPECTOMY;  Surgeon: Daneil Dolin, MD;  [facility];  Service: Endoscopy;;   right ankle     SHOULDER SURGERY Right 2010   Dr. Percell Miller   TOTAL KNEE ARTHROPLASTY Right 08/25/2014   Procedure: TOTAL KNEE ARTHROPLASTY;  Surgeon: Ninetta Lights, MD;   Location: [place];  Service: Orthopedics;  Laterality: Right;   Family History:  Family History  Problem Relation Age of Onset   Hepatitis Mother    Cirrhosis Mother        Did not drink   Pneumonia Father    Kidney failure Father    Gastric cancer Brother        ? Gastric CA spread to esophagus   Colon cancer Neg Hx    Family Psychiatric  History: None reported Social History:  Social History   Substance and Sexual Activity  Alcohol Use Yes   Comment: 10-20/day cans beer     Social History   Substance and Sexual Activity  Drug Use No    Social History   Socioeconomic History   Marital status: Single    Spouse name: Not on file   Number of children: Not on file   Years of education: Not on file   Highest education level: Not on file  Occupational History   Occupation: unemployed  Tobacco Use   Smoking status: Every Day    Packs/day: 1.50    Years: 25.00    Pack years: 37.50    Types: Cigarettes   Smokeless tobacco: Never  Vaping Use   Vaping Use: Never used  Substance and Sexual Activity   Alcohol use: Yes    Comment: 10-20/day cans beer   Drug use: No   Sexual activity: Yes  Other Topics Concern   Not on file  Social History Narrative   Not on file   Social Determinants of Health   Financial Resource Strain: Not on file  Food Insecurity: Not on file  Transportation Needs: Not on file  Physical Activity: Not on file  Stress: Not on file  Social Connections: Not on file   Additional Social History:    Sleep: Good  Appetite:  Good  Current Medications: Current Facility-Administered Medications  Medication Dose Route Frequency Provider Last Rate Last Admin   alum & mag hydroxide-simeth (MAALOX/MYLANTA) 200-200-20 MG/5ML suspension 30 mL  30 mL Oral Q4H PRN Lovena Le, Cody W, PA-C       FLUoxetine (PROZAC) capsule 20 mg  20 mg Oral Daily Lovena Le, Cody W, PA-C   20 mg at [id number]   folic acid (FOLVITE) tablet 1 mg  1 mg Oral Daily Doda, Vandana,  MD   1 mg at 10/08/20 0900   gabapentin (NEURONTIN) capsule 100 mg  100 mg Oral TID Armando Reichert, MD   100 mg at 10/08/20 1717   hydrOXYzine (ATARAX/VISTARIL) tablet 25 mg  25 mg Oral Q6H PRN Margorie John W, PA-C       loperamide (IMODIUM) capsule 2-4 mg  2-4 mg Oral PRN Lovena Le, Cody W, PA-C       LORazepam (ATIVAN) tablet 1 mg  1 mg Oral TID Armando Reichert, MD   1 mg at 10/08/20 1717   Followed by   Derrill Memo ON 10/09/2020] LORazepam (ATIVAN) tablet 1 mg  1 mg Oral BID Armando Reichert, MD       Followed by   Derrill Memo ON  10/11/2020] LORazepam (ATIVAN) tablet 1 mg  1 mg Oral Daily Doda, Vandana, MD       LORazepam (ATIVAN) tablet 1 mg  1 mg Oral Q6H PRN Nelda Marseille, Amy E, MD       magnesium hydroxide (MILK OF MAGNESIA) suspension 30 mL  30 mL Oral Daily PRN Margorie John W, PA-C       multivitamin with minerals tablet 1 tablet  1 tablet Oral Daily Margorie John W, PA-C   1 tablet at 10/08/20 0900   ondansetron (ZOFRAN-ODT) disintegrating tablet 4 mg  4 mg Oral Q6H PRN Margorie John W, PA-C       pantoprazole (PROTONIX) EC tablet 40 mg  40 mg Oral Daily Singleton, Amy E, MD   40 mg at 10/08/20 0900   thiamine tablet 100 mg  100 mg Oral Daily Margorie John W, PA-C   100 mg at 10/08/20 0900   traZODone (DESYREL) tablet 100 mg  100 mg Oral QHS Margorie John W, PA-C   100 mg at 10/07/20 2155    Lab Results:  Results for orders placed or performed during the hospital encounter of 10/07/20 (from the past 48 hour(s))  CBC with Differential/Platelet     Status: Abnormal   Collection Time: 10/08/20  6:43 AM  Result Value Ref Range   WBC 5.3 4.0 - 10.5 K/uL   RBC 5.14 4.22 - 5.81 MIL/uL   Hemoglobin 16.2 13.0 - 17.0 g/dL   HCT 47.9 39.0 - 52.0 %   MCV 93.2 80.0 - 100.0 fL   MCH 31.5 26.0 - 34.0 pg   MCHC 33.8 30.0 - 36.0 g/dL   RDW 13.5 11.5 - 15.5 %   Platelets 100 (L) 150 - 400 K/uL    Comment: SPECIMEN CHECKED FOR CLOTS Immature Platelet Fraction may be clinically indicated, consider ordering this  additional test [id number] REPEATED TO VERIFY PLATELET COUNT CONFIRMED BY SMEAR    nRBC 0.6 (H) 0.0 - 0.2 %   Neutrophils Relative % 52 %   Neutro Abs 2.7 1.7 - 7.7 K/uL   Lymphocytes Relative 34 %   Lymphs Abs 1.8 0.7 - 4.0 K/uL   Monocytes Relative 10 %   Monocytes Absolute 0.5 0.1 - 1.0 K/uL   Eosinophils Relative 3 %   Eosinophils Absolute 0.2 0.0 - 0.5 K/uL   Basophils Relative 1 %   Basophils Absolute 0.1 0.0 - 0.1 K/uL   Immature Granulocytes 0 %   Abs Immature Granulocytes 0.01 0.00 - 0.07 K/uL    Comment: Performed at [facility], [place] [address], [place], [place] [address]  Comprehensive metabolic panel     Status: Abnormal   Collection Time: 10/08/20  6:43 AM  Result Value Ref Range   Sodium 139 135 - 145 mmol/L   Potassium 4.0 3.5 - 5.1 mmol/L   Chloride 102 98 - 111 mmol/L   CO2 28 22 - 32 mmol/L   Glucose, Bld 77 70 - 99 mg/dL    Comment: Glucose reference range applies only to samples taken after fasting for at least 8 hours.   BUN 13 6 - 20 mg/dL   Creatinine, Ser 0.89 0.61 - 1.24 mg/dL   Calcium 9.3 8.9 - 10.3 mg/dL   Total Protein 7.6 6.5 - 8.1 g/dL   Albumin 4.3 3.5 - 5.0 g/dL   AST 108 (H) 15 - 41 U/L   ALT 48 (H) 0 - 44 U/L   Alkaline Phosphatase 96 38 - 126 U/L  Total Bilirubin 2.7 (H) 0.3 - 1.2 mg/dL   GFR, Estimated >60 >60 mL/min    Comment: (NOTE) Calculated using the CKD-EPI Creatinine Equation (2021)    Anion gap 9 5 - 15    Comment: Performed at [facility], [place] [address], [place], [place] [address]  Hemoglobin A1c     Status: Abnormal   Collection Time: 10/08/20  6:43 AM  Result Value Ref Range   Hgb A1c MFr Bld 4.3 (L) 4.8 - 5.6 %    Comment: (NOTE) Pre diabetes:          5.7%-6.4%  Diabetes:              >6.4%  Glycemic control for   <7.0% adults with diabetes    Mean Plasma Glucose 76.71 mg/dL    Comment: Performed at [facility] [address] [place], [place] [address]  Lipid  panel     Status: None   Collection Time: 10/08/20  6:43 AM  Result Value Ref Range   Cholesterol 122 0 - 200 mg/dL   Triglycerides 62 <150 mg/dL   HDL 61 >40 mg/dL   Total CHOL/HDL Ratio 2.0 RATIO   VLDL 12 0 - 40 mg/dL   LDL Cholesterol 49 0 - 99 mg/dL    Comment:        Total Cholesterol/HDL:CHD Risk Coronary Heart Disease Risk Table                     Men   Women  1/2 Average Risk   3.4   3.3  Average Risk       5.0   4.4  2 X Average Risk   9.6   7.1  3 X Average Risk  23.4   11.0        Use the calculated Patient Ratio above and the CHD Risk Table to determine the patient's CHD Risk.        ATP III CLASSIFICATION (LDL):  <100     mg/dL   Optimal  100-129  mg/dL   Near or Above                    Optimal  130-159  mg/dL   Borderline  160-189  mg/dL   High  >190     mg/dL   Very High Performed at [facility] [address], [place], [place] [address]   TSH     Status: None   Collection Time: 10/08/20  6:43 AM  Result Value Ref Range   TSH 4.046 0.350 - 4.500 uIU/mL    Comment: Performed by a 3rd Generation assay with a functional sensitivity of <=0.01 uIU/mL. Performed at [facility], [place] [address] [place],  [address]     Blood Alcohol level:  Lab Results  Component Value Date   ETH 270 (H) 10/06/2020   ETH 188 (H) [id number]    Metabolic Disorder Labs: Lab Results  Component Value Date   HGBA1C 4.3 (L) 10/08/2020   MPG 76.71 10/08/2020   MPG 85.32 05/19/2020   No results found for: PROLACTIN Lab Results  Component Value Date   CHOL 122 10/08/2020   TRIG 62 10/08/2020   HDL 61 10/08/2020   CHOLHDL 2.0 10/08/2020   VLDL 12 10/08/2020   LDLCALC 49 10/08/2020    Physical Findings: AIMS: Facial and Oral Movements Muscles of Facial Expression: None, normal Lips and Perioral Area: None, normal  Jaw: None, normal Tongue: None, normal,Extremity Movements Upper (arms, wrists, hands, fingers): None,  normal Lower (legs, knees, ankles, toes): None, normal, Trunk Movements Neck, shoulders, hips: None, normal, Overall Severity Severity of abnormal movements (highest score from questions above): None, normal Incapacitation due to abnormal movements: None, normal Patient's awareness of abnormal movements (rate only patient's report): No Awareness, Dental Status Current problems with teeth and/or dentures?: Yes Does patient usually wear dentures?: No  CIWA:  CIWA-Ar Total: 2 COWS:     Musculoskeletal: Strength & Muscle Tone: within normal limits Gait & Station:  When he begins ambulating from a seated position, he limps, slightly dragging his left leg.  After a few steps, his gait normalizes Patient leans: N/A  Psychiatric Specialty Exam:  Presentation  General Appearance: Appropriate for Environment  Eye Contact:Good  Speech:Clear and Coherent; Normal Rate  Speech Volume:Normal  Handedness:Right   Mood and Affect  Mood:Euthymic (Ok)  Affect:Constricted   Thought Process  Thought Processes:Coherent; Linear  Descriptions of Associations:Intact  Orientation:Full (Time, Place and Person)  Thought Content:Logical (Patient denies SI/HI/AVH, paranoia, and first rank symptoms.  He does not vocalize delusions.)  History of Schizophrenia/Schizoaffective disorder:No data recorded Duration of Psychotic Symptoms:No data recorded Hallucinations:Hallucinations: None  Ideas of Reference:None  Suicidal Thoughts:Suicidal Thoughts: No  Homicidal Thoughts:Homicidal Thoughts: No   Sensorium  Memory:Immediate Fair; Recent Fair; Remote Fair  Judgment:Poor  Insight:Fair   Executive Functions  Concentration:Good  Attention Span:Good  [place]  Language:Good   Psychomotor Activity  Psychomotor Activity:Psychomotor Activity: Normal   Assets  Assets:Communication Skills; Desire for Improvement; Housing; Catering manager;  Resilience   Sleep  Sleep:Sleep: Good Number of Hours of Sleep: 6.75    Physical Exam: Physical Exam Vitals and nursing note reviewed.  Constitutional:      General: He is not in acute distress.    Appearance: Normal appearance.  HENT:     Head: Normocephalic and atraumatic.     Mouth/Throat:     Comments: Poor dentition Pulmonary:     Effort: Pulmonary effort is normal.  Skin:    General: Skin is warm and dry.  Neurological:     General: No focal deficit present.     Mental Status: He is alert and oriented to person, place, and time.     Comments: Patient reports feeling "shaky" but no tremors observed on physical exam   Review of Systems  Constitutional:  Positive for chills and diaphoresis.  Respiratory:  Negative for shortness of breath.   Cardiovascular:  Negative for chest pain.  Gastrointestinal:  Negative for abdominal pain, constipation, diarrhea, nausea and vomiting.  Genitourinary: Negative.   Neurological:  Positive for tremors. Negative for dizziness and headaches.  Psychiatric/Behavioral:  The patient is nervous/anxious.   Blood pressure 111/76, pulse 65, temperature 98 F (36.7 C), temperature source Oral, resp. rate 14, height '6\' 3"'$  (1.905 m), weight 90.7 kg, SpO2 98 %. Body mass index is 25 kg/m.   Treatment Plan Summary: Callum denies SI/HI/AVH on assessment today.  He only complains of withdrawal symptoms, with anxiety being one of the symptoms. Patient admitted to recent alcohol use. Ethyl alcohol level high at 270 Daily contact with patient to assess and evaluate symptoms and progress in treatment  Substance-induced mood disorder -Continue Prozac 20 mg daily   Alcohol withdrawal -Ativan taper to be finished on 9/20. CIWA protocol with additional Ativan '1mg'$  for scores >10 -Thiamine, multivitamin, folate. -Increase gabapentin to 200 mg 3 times daily for alcohol craving and  potential seizures - SW to assist with referrals to [facility] or residential  rehab and patient counseled on the need to abstain from alcohol use - start protonix '40mg'$  daily   High LFT's AST/ALT 108/48 (84/45 on 9/16) consistent with alcohol use. -Hep A, hep C, hep B nonreactive on 4/28 - Repeating CMP tomorrow for trending   High TSH On 4/28 -HbA1c 4.6, TSH 5.479, T4 low at 0.59, T3 uptake ratio high at 45. TSH 4.046. -Synthroid not indicated at this time   Thrombocytopenia Platelets 100 (100 yesterday as well) - Appears chronic; no intervention indicated at this time   H/o withdrawal seizures Patient had withdrawal seizure 2 years ago, no other seizures outside of alcohol use.  Patient has not taken Keppra since June - Neurontin increased to '200mg'$  tid  - Seizure precautions   Hyponatremia, resolved -Na 139 (increased from 132)   Nicotine dependence -Nicotine Patch 7 mg daily Daily   PRN's -Continue Milk of Magnesia 30 ml PRN Daily for Constipation. -Continue Maalox/Mylanta 30 ml Q4H PRN for Indigestion. -Start Hydroxyzine 25 mg TID PRN for Anxiety. -Start Trazodone 50 mg QHS PRN for sleep.  Rosezetta Schlatter, MD 10/08/2020, 6:16 PM
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
## 2020-10-08 NOTE — [facility] Group Notes (Signed)
Pt received anger management pycho-ed group materials and was encouraged to come to staff to discuss it 1:1 once completed.  

## 2020-10-08 NOTE — Progress Notes (Signed)
Adult Psychoeducational Group Note  Date:  10/08/2020 Time:  9:14 PM  Group Topic/Focus:  Wrap-Up Group:   The focus of this group is to help patients review their daily goal of treatment and discuss progress on daily workbooks.  Participation Level:  Active  Participation Quality:  Appropriate  Affect:  Appropriate  Cognitive:  Appropriate  Insight: Appropriate  Engagement in Group:  Developing/Improving  Modes of Intervention:  Discussion  Additional Comments:  Pt stated his goal for today was to focus on his treatment plan. Pt stated he accomplished his goal today. Pt stated he talked with his doctor and social worker about his care today. Pt rated his overall day a 7 out of 10. Pt stated he made no calls today. Pt stated he felt better about himself today. Pt stated he was able to attend all meals. Pt stated he took all medications provided today. Pt stated he attend all groups held today. Pt stated his appetite was pretty good today. Pt rated sleep last night was pretty good. Pt stated the goal tonight was to get some rest. Pt stated he had no physical pain today. Pt deny visual hallucinations and auditory issues tonight. Pt denies thoughts of harming himself or others. Pt stated he would alert staff if anything changed.  Candy Sledge 10/08/2020, 9:14 PM

## 2020-10-09 DIAGNOSIS — F1094 Alcohol use, unspecified with alcohol-induced mood disorder: Secondary | ICD-10-CM | POA: Diagnosis not present

## 2020-10-09 LAB — COMPREHENSIVE METABOLIC PANEL
ALT: 75 U/L — ABNORMAL HIGH (ref 0–44)
AST: 150 U/L — ABNORMAL HIGH (ref 15–41)
Albumin: 4 g/dL (ref 3.5–5.0)
Alkaline Phosphatase: 115 U/L (ref 38–126)
Anion gap: 11 (ref 5–15)
BUN: 10 mg/dL (ref 6–20)
CO2: 27 mmol/L (ref 22–32)
Calcium: 9.3 mg/dL (ref 8.9–10.3)
Chloride: 101 mmol/L (ref 98–111)
Creatinine, Ser: 0.96 mg/dL (ref 0.61–1.24)
GFR, Estimated: >60 mL/min (ref >60)
Glucose, Bld: 101 mg/dL — ABNORMAL HIGH (ref 70–99)
Potassium: 4.1 mmol/L (ref 3.5–5.1)
Sodium: 139 mmol/L (ref 135–145)
Total Bilirubin: 2.1 mg/dL — ABNORMAL HIGH (ref 0.3–1.2)
Total Protein: 7 g/dL (ref 6.5–8.1)

## 2020-10-09 MED ORDER — GABAPENTIN 300 MG PO CAPS
300.0000 mg | ORAL_CAPSULE | Freq: Three times a day (TID) | ORAL | Status: DC
  Administered 2020-10-09 – 2020-10-13 (×13): 300 mg via ORAL
  Filled 2020-10-09 (×19): qty 1

## 2020-10-09 MED ORDER — NICOTINE 7 MG/24HR TD PT24
7.0000 mg | MEDICATED_PATCH | Freq: Every day | TRANSDERMAL | Status: DC
  Administered 2020-10-09 – 2020-10-17 (×9): 7 mg via TRANSDERMAL
  Filled 2020-10-09 (×11): qty 1

## 2020-10-09 MED ORDER — NICOTINE 7 MG/24HR TD PT24
MEDICATED_PATCH | TRANSDERMAL | Status: AC
  Filled 2020-10-09: qty 1

## 2020-10-09 NOTE — Group Note (Signed)
[place] Group Notes: (Clinical Social Work)   10/09/2020      Type of Therapy:  Group Therapy   Participation Level:  Did Not Attend - was invited both individually by MHT and by overhead announcement, chose not to attend.   Selmer Dominion, LCSW 10/09/2020, 10:56 AM
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
## 2020-10-09 NOTE — BHH Group Notes (Signed)
Adult Psychoeducational Group Note  Date:  10/09/2020 Time:  2:43 PM  Group Topic/Focus:  Personal Choices and Values:   The focus of this group is to help patients assess and explore the importance of values in their lives, how their values affect their decisions, how they express their values and what opposes their expression.  Participation Level:  Active  Participation Quality:  Appropriate  Affect:  Appropriate  Cognitive:  Alert  Insight: Appropriate  Engagement in Group:  Engaged  Modes of Intervention:  Socialization  Additional Comments:  Pt attended group.   Dionne Milo 10/09/2020, 2:43 PM

## 2020-10-09 NOTE — Progress Notes (Signed)
[place] Group Notes:  (Nursing/MHT/Case Management/Adjunct)  Date:  10/09/2020  Time:  2000  Type of Therapy:   wrap up group  Participation Level:  Active  Participation Quality:  Appropriate, Attentive, Sharing, and Supportive  Affect:  Appropriate  Cognitive:  Alert  Insight:  Improving  Engagement in Group:  Engaged  Modes of Intervention:  Clarification, Education, and Support  Summary of Progress/Problems: Positive thinking and positive change were discussed.   Shellia Cleverly 10/09/2020, 8:50 PM

## 2020-10-09 NOTE — BHH Group Notes (Signed)
Pt attended relaxation group/

## 2020-10-09 NOTE — Progress Notes (Signed)
   10/09/20 0300  Psychosocial Assessment  Eye Contact Fair  Facial Expression Flat  Affect Depressed  Speech Logical/coherent  Interaction Minimal;Forwards little  Motor Activity Slow;Tremors  Appearance/Hygiene In scrubs  Thought Process  Coherency WDL  Content WDL  Delusions None reported or observed  Perception WDL  Hallucination None reported or observed  Judgment Impaired  Confusion None  Danger to Self  Current suicidal ideation? Denies  Danger to Others  Danger to Others None reported or observed    10/09/20 0300  Psychosocial Assessment  Eye Contact Fair  Facial Expression Flat  Affect Depressed  Speech Logical/coherent  Interaction Minimal;Forwards little  Motor Activity Slow;Tremors  Appearance/Hygiene In scrubs  Thought Process  Coherency WDL  Content WDL  Delusions None reported or observed  Perception WDL  Hallucination None reported or observed  Judgment Impaired  Confusion None  Danger to Self  Current suicidal ideation? Denies  Danger to Others  Danger to Others None reported or observed

## 2020-10-09 NOTE — Progress Notes (Signed)
[facility] MD Progress Note  10/09/2020 7:05 AM Brandon Miller  MRN:  [id number]  Chief Complaint: SI and alcohol abuse  Reason for admission: Vang is a 55 year old male with a psychiatric history of alcohol use disorder- severe/dependent and depression, and a medical history of diabetes mellitus, who initially presented to [facility] for evaluation of suicidal ideation with a plan to shoot himself if he goes home.The patient is currently on Hospital Day 2.   Chart Review from last 24 hours:  The patient's chart was reviewed and nursing notes were reviewed. The patient's case was discussed in multidisciplinary team meeting. Per nursing, the patient was calm and cooperative but guarded in interactions. He attended some groups. He had no acute safety or behavioral concerns noted. Per [facility] he was compliant with scheduled medications and did received Vistaril PRN X1.  Information Obtained Today During Patient Interview: The patient was seen and evaluated on the unit. On assessment today the patient reports that he is feeling improved in terms of his mood but overall still feels depressed. He does not want residential rehab but would consider [facility] after discharge. He denies SI, HI, AVH, paranoia, or delusions. He states he has had some anxiety but his tremors and sweats are resolved. He denies other signs of withdrawal at this time. He states his sleep has been "okay" and he was encouraged to get out of bed and attend to ADLs as well as attend groups. He reports good appetite and denies medication side-effects.   Principal Problem: Alcohol-induced mood disorder with depressive symptoms (HCC) Diagnosis: Principal Problem:   Alcohol-induced mood disorder with depressive symptoms (HCC) Active Problems:   Diabetes mellitus, type 2 (HCC)   Elevated LFTs   Alcohol dependence ([place])   GERD (gastroesophageal reflux disease)   History of seizures  Total Time spent with patient: I personally spent 25  minutes on the unit in direct patient care. The direct patient care time included face-to-face time with the patient, reviewing the patient's chart, communicating with other professionals, and coordinating care. Greater than 50% of this time was spent in counseling or coordinating care with the patient regarding goals of hospitalization, psycho-education, and discharge planning needs.   Past Psychiatric History: Alcohol dependence, elevated LFTs, alcohol induced mood disorder. He has been complaint with his medication. He has been taking Prozac 20 mg daily.  He states he has tried Dentist  Past Medical History:  Past Medical History:  Diagnosis Date   Allergy    Anemia    Anxiety    Arthritis    RA   Complication of anesthesia    hard to wake up-was told he may have sleep apnea-never tested   Depression    Diabetes mellitus without complication ([place])    with weight no more problems   Seizure ([place])    had one seizure of unknown etiology a few tears ago, none since and on no meds for them   Sleep apnea    Snores     Past Surgical History:  Procedure Laterality Date   BIOPSY  02/12/2019   Procedure: BIOPSY;  Surgeon: Daneil Dolin, MD;  [facility];  Service: Endoscopy;;  stomach   COLONOSCOPY WITH PROPOFOL N/A 02/12/2019   Procedure: COLONOSCOPY WITH PROPOFOL;  Surgeon: Daneil Dolin, MD;  [facility];  Service: Endoscopy;  Laterality: N/A;  10:30am   ELBOW SURGERY Right 2010   Dr. Percell Miller   ESOPHAGOGASTRODUODENOSCOPY (EGD) WITH PROPOFOL N/A 02/12/2019  Procedure: ESOPHAGOGASTRODUODENOSCOPY (EGD) WITH PROPOFOL;  Surgeon: Daneil Dolin, MD;  [facility];  Service: Endoscopy;  Laterality: N/A;   HERNIA REPAIR     Right inguinal   KNEE ARTHROSCOPY WITH MEDIAL MENISECTOMY Right 06/11/2013   Procedure: RIGHT KNEE ARTHROSCOPY WITH MEDIAL MENISECTOMY AND CHONDROPLASTY;  Surgeon: Ninetta Lights, MD;  Location: [facility];   Service: Orthopedics;  Laterality: Right;  Chondroplasty, loose bodies, medial menisectomy, lysis of adhesions.   KNEE SURGERY Right 2011   POLYPECTOMY  02/12/2019   Procedure: POLYPECTOMY;  Surgeon: Daneil Dolin, MD;  [facility];  Service: Endoscopy;;   right ankle     SHOULDER SURGERY Right 2010   Dr. Percell Miller   TOTAL KNEE ARTHROPLASTY Right 08/25/2014   Procedure: TOTAL KNEE ARTHROPLASTY;  Surgeon: Ninetta Lights, MD;  Location: [place];  Service: Orthopedics;  Laterality: Right;   Family History:  Family History  Problem Relation Age of Onset   Hepatitis Mother    Cirrhosis Mother        Did not drink   Pneumonia Father    Kidney failure Father    Gastric cancer Brother        ? Gastric CA spread to esophagus   Colon cancer Neg Hx    Family Psychiatric  History:see H&P  Social History:  Social History   Substance and Sexual Activity  Alcohol Use Yes   Comment: 10-20/day cans beer     Social History   Substance and Sexual Activity  Drug Use No    Social History   Socioeconomic History   Marital status: Single    Spouse name: Not on file   Number of children: Not on file   Years of education: Not on file   Highest education level: Not on file  Occupational History   Occupation: unemployed  Tobacco Use   Smoking status: Every Day    Packs/day: 1.50    Years: 25.00    Pack years: 37.50    Types: Cigarettes   Smokeless tobacco: Never  Vaping Use   Vaping Use: Never used  Substance and Sexual Activity   Alcohol use: Yes    Comment: 10-20/day cans beer   Drug use: No   Sexual activity: Yes  Other Topics Concern   Not on file  Social History Narrative   Not on file   Social Determinants of Health   Financial Resource Strain: Not on file  Food Insecurity: Not on file  Transportation Needs: Not on file  Physical Activity: Not on file  Stress: Not on file  Social Connections: Not on file   Additional Social History:   Sleep:  Fair  Appetite:  Good  Current Medications: Current Facility-Administered Medications  Medication Dose Route Frequency Provider Last Rate Last Admin   alum & mag hydroxide-simeth (MAALOX/MYLANTA) 200-200-20 MG/5ML suspension 30 mL  30 mL Oral Q4H PRN Lovena Le, Cody W, PA-C       FLUoxetine (PROZAC) capsule 20 mg  20 mg Oral Daily Lovena Le, Cody W, PA-C   20 mg at [id number]   folic acid (FOLVITE) tablet 1 mg  1 mg Oral Daily Doda, Vandana, MD   1 mg at 10/08/20 0900   gabapentin (NEURONTIN) capsule 200 mg  200 mg Oral TID Rosezetta Schlatter, MD       hydrOXYzine (ATARAX/VISTARIL) tablet 25 mg  25 mg Oral Q6H PRN Prescilla Sours, PA-C   25 mg at 10/08/20 2135   loperamide (  IMODIUM) capsule 2-4 mg  2-4 mg Oral PRN Lovena Le, Cody W, PA-C       LORazepam (ATIVAN) tablet 1 mg  1 mg Oral TID Armando Reichert, MD   1 mg at 10/08/20 1717   Followed by   LORazepam (ATIVAN) tablet 1 mg  1 mg Oral BID Armando Reichert, MD       Followed by   Derrill Memo ON 10/11/2020] LORazepam (ATIVAN) tablet 1 mg  1 mg Oral Daily Doda, Vandana, MD       LORazepam (ATIVAN) tablet 1 mg  1 mg Oral Q6H PRN Nelda Marseille, Jazelyn Sipe E, MD       magnesium hydroxide (MILK OF MAGNESIA) suspension 30 mL  30 mL Oral Daily PRN Margorie John W, PA-C       multivitamin with minerals tablet 1 tablet  1 tablet Oral Daily Margorie John W, PA-C   1 tablet at 10/08/20 0900   ondansetron (ZOFRAN-ODT) disintegrating tablet 4 mg  4 mg Oral Q6H PRN Lovena Le, Cody W, PA-C       pantoprazole (PROTONIX) EC tablet 40 mg  40 mg Oral Daily Sharion Grieves E, MD   40 mg at 10/08/20 0900   thiamine tablet 100 mg  100 mg Oral Daily Margorie John W, PA-C   100 mg at 10/08/20 0900   traZODone (DESYREL) tablet 100 mg  100 mg Oral QHS Margorie John W, PA-C   100 mg at 10/08/20 2134    Lab Results:  Results for orders placed or performed during the hospital encounter of 10/07/20 (from the past 48 hour(s))  CBC with Differential/Platelet     Status: Abnormal   Collection Time:  10/08/20  6:43 AM  Result Value Ref Range   WBC 5.3 4.0 - 10.5 K/uL   RBC 5.14 4.22 - 5.81 MIL/uL   Hemoglobin 16.2 13.0 - 17.0 g/dL   HCT 47.9 39.0 - 52.0 %   MCV 93.2 80.0 - 100.0 fL   MCH 31.5 26.0 - 34.0 pg   MCHC 33.8 30.0 - 36.0 g/dL   RDW 13.5 11.5 - 15.5 %   Platelets 100 (L) 150 - 400 K/uL    Comment: SPECIMEN CHECKED FOR CLOTS Immature Platelet Fraction may be clinically indicated, consider ordering this additional test [id number] REPEATED TO VERIFY PLATELET COUNT CONFIRMED BY SMEAR    nRBC 0.6 (H) 0.0 - 0.2 %   Neutrophils Relative % 52 %   Neutro Abs 2.7 1.7 - 7.7 K/uL   Lymphocytes Relative 34 %   Lymphs Abs 1.8 0.7 - 4.0 K/uL   Monocytes Relative 10 %   Monocytes Absolute 0.5 0.1 - 1.0 K/uL   Eosinophils Relative 3 %   Eosinophils Absolute 0.2 0.0 - 0.5 K/uL   Basophils Relative 1 %   Basophils Absolute 0.1 0.0 - 0.1 K/uL   Immature Granulocytes 0 %   Abs Immature Granulocytes 0.01 0.00 - 0.07 K/uL    Comment: Performed at [facility], [place] [address] [place], [place] [address]  Comprehensive metabolic panel     Status: Abnormal   Collection Time: 10/08/20  6:43 AM  Result Value Ref Range   Sodium 139 135 - 145 mmol/L   Potassium 4.0 3.5 - 5.1 mmol/L   Chloride 102 98 - 111 mmol/L   CO2 28 22 - 32 mmol/L   Glucose, Bld 77 70 - 99 mg/dL    Comment: Glucose reference range applies only to samples taken after fasting for at least 8 hours.  BUN 13 6 - 20 mg/dL   Creatinine, Ser 0.89 0.61 - 1.24 mg/dL   Calcium 9.3 8.9 - 10.3 mg/dL   Total Protein 7.6 6.5 - 8.1 g/dL   Albumin 4.3 3.5 - 5.0 g/dL   AST 108 (H) 15 - 41 U/L   ALT 48 (H) 0 - 44 U/L   Alkaline Phosphatase 96 38 - 126 U/L   Total Bilirubin 2.7 (H) 0.3 - 1.2 mg/dL   GFR, Estimated >60 >60 mL/min    Comment: (NOTE) Calculated using the CKD-EPI Creatinine Equation (2021)    Anion gap 9 5 - 15    Comment: Performed at [facility], [place] [address],  [place], [place] [address]  Hemoglobin A1c     Status: Abnormal   Collection Time: 10/08/20  6:43 AM  Result Value Ref Range   Hgb A1c MFr Bld 4.3 (L) 4.8 - 5.6 %    Comment: (NOTE) Pre diabetes:          5.7%-6.4%  Diabetes:              >6.4%  Glycemic control for   <7.0% adults with diabetes    Mean Plasma Glucose 76.71 mg/dL    Comment: Performed at [facility] [address] [place], [place] [address]  Lipid panel     Status: None   Collection Time: 10/08/20  6:43 AM  Result Value Ref Range   Cholesterol 122 0 - 200 mg/dL   Triglycerides 62 <150 mg/dL   HDL 61 >40 mg/dL   Total CHOL/HDL Ratio 2.0 RATIO   VLDL 12 0 - 40 mg/dL   LDL Cholesterol 49 0 - 99 mg/dL    Comment:        Total Cholesterol/HDL:CHD Risk Coronary Heart Disease Risk Table                     Men   Women  1/2 Average Risk   3.4   3.3  Average Risk       5.0   4.4  2 X Average Risk   9.6   7.1  3 X Average Risk  23.4   11.0        Use the calculated Patient Ratio above and the CHD Risk Table to determine the patient's CHD Risk.        ATP III CLASSIFICATION (LDL):  <100     mg/dL   Optimal  100-129  mg/dL   Near or Above                    Optimal  130-159  mg/dL   Borderline  160-189  mg/dL   High  >190     mg/dL   Very High Performed at [facility] [address], [place], [place] [address]   TSH     Status: None   Collection Time: 10/08/20  6:43 AM  Result Value Ref Range   TSH 4.046 0.350 - 4.500 uIU/mL    Comment: Performed by a 3rd Generation assay with a functional sensitivity of <=0.01 uIU/mL. Performed at [facility], [place] [address] [place], [place] [address]     Blood Alcohol level:  Lab Results  Component Value Date   ETH 270 (H) 10/06/2020   ETH 188 (H) [id number]    Metabolic Disorder Labs: Lab Results  Component Value Date   HGBA1C 4.3 (L) 10/08/2020   MPG 76.71 10/08/2020  MPG 85.32 05/19/2020   No results found for:  PROLACTIN Lab Results  Component Value Date   CHOL 122 10/08/2020   TRIG 62 10/08/2020   HDL 61 10/08/2020   CHOLHDL 2.0 10/08/2020   VLDL 12 10/08/2020   LDLCALC 49 10/08/2020    Physical Findings: AIMS: Facial and Oral Movements Muscles of Facial Expression: None, normal Lips and Perioral Area: None, normal Jaw: None, normal Tongue: None, normal,Extremity Movements Upper (arms, wrists, hands, fingers): None, normal Lower (legs, knees, ankles, toes): None, normal, Trunk Movements Neck, shoulders, hips: None, normal, Overall Severity Severity of abnormal movements (highest score from questions above): None, normal Incapacitation due to abnormal movements: None, normal Patient's awareness of abnormal movements (rate only patient's report): No Awareness, Dental Status Current problems with teeth and/or dentures?: Yes Does patient usually wear dentures?: No  CIWA:  CIWA-Ar Total: 1   Musculoskeletal: Strength & Muscle Tone: untested Gait & Station:  untested in bed Patient leans: N/A  Psychiatric Specialty Exam:  Presentation  General Appearance: Unkempt appearing, in scrubs  Eye Contact:Fair  Speech:Clear and Coherent; Normal Rate  Speech Volume:Decreased  Handedness:Right   Mood and Affect  Mood:dysphoric   Affect:Constricted   Thought Process  Thought Processes:Coherent; Linear  Descriptions of Associations:Intact  Orientation:Full (Time, Place and Person)  Thought Content:Logical (Patient denies SI/HI/AVH, paranoia, and first rank symptoms.  He does not vocalize delusions.)  History of Schizophrenia/Schizoaffective disorder:No data recorded Duration of Psychotic Symptoms:No data recorded Hallucinations:Hallucinations: None  Ideas of Reference:None  Suicidal Thoughts:Suicidal Thoughts: No  Homicidal Thoughts:Homicidal Thoughts: No   Sensorium  Memory:Immediate Fair; Recent Fair; Remote Fair  Judgment:Improving   Insight:Fair   Executive  Functions  Concentration:Good  Attention Span:Good  [place]  Language:Good   Psychomotor Activity  Psychomotor Activity:Psychomotor Activity: Normal   Assets  Assets:Communication Skills; Desire for Improvement; Housing; Catering manager; Resilience   Sleep  Sleep:Sleep: Good Number of Hours of Sleep: 6.75    Physical Exam: Physical Exam Vitals and nursing note reviewed.  Constitutional:      General: He is not in acute distress.    Appearance: Normal appearance.  HENT:     Head: Normocephalic and atraumatic.     Mouth/Throat:     Comments: Poor dentition Pulmonary:     Effort: Pulmonary effort is normal.  Neurological:     General: No focal deficit present.     Mental Status: He is alert.   Review of Systems  Constitutional:  Negative for chills and diaphoresis.  Respiratory:  Negative for shortness of breath.   Cardiovascular:  Negative for chest pain.  Gastrointestinal:  Negative for abdominal pain, constipation, diarrhea, nausea and vomiting.  Genitourinary: Negative.   Neurological:  Negative for dizziness, tremors and headaches.  Psychiatric/Behavioral:  The patient is nervous/anxious.   Blood pressure 104/72, pulse 70, temperature 98 F (36.7 C), temperature source Oral, resp. rate 14, height '6\' 3"'$  (1.905 m), weight 90.7 kg, SpO2 100 %. Body mass index is 25 kg/m.   Treatment Plan Summary: Daily contact with patient to assess and evaluate symptoms and progress in treatment  Alcohol induced depressive d/o (r/o MDD) -Continue Prozac 20 mg daily   Alcohol use d/o - severe dependence -Continue Ativan scheduled taper to be finished on 9/20. CIWA protocol with additional Ativan '1mg'$  for scores >10 -Continue Thiamine, multivitamin, folate oral replacement -Increase gabapentin to 300 mg 3 times daily for alcohol craving, anxiety, and potential seizures - SW to assist with referrals to [facility]  and patient counseled  on the need to abstain from alcohol use - Continue Protonix '40mg'$  daily for GI protection   Transaminitis - 9/18 - AST 150 and ALT 75 (trending up) -Hep A, hep C, hep B nonreactive on 4/28 - will need outpatient f/u after discharge - Will check PT/INR, PTT, and continue to trend LFTs   H/o high TSH On 4/28 -TSH 5.479, T4 low at 0.59, T3 uptake ratio high at 45. On 9/17 TSH 4.046 - patient currently off Synthroid and advised to f/u with PCP for monitoring after discharge  Thrombocytopenia Platelets 100 on 9/18 and on 9/17 - Appears chronic; -will need outpatient f/u after discharge   H/o withdrawal seizures Patient had withdrawal seizure 2 years ago, no other seizures outside of alcohol use.  Patient has not taken Keppra since June - Neurontin increased to '300mg'$  tid  - Seizure precautions   Hyponatremia, resolved -Na 139 (increased from 132) - Encouraging po hydration and increase po intake - Ensure ordered   Nicotine dependence -Nicotine Patch 7 mg daily   PRN's -Continue Milk of Magnesia 30 ml PRN Daily for Constipation. -Continue Maalox/Mylanta 30 ml Q4H PRN for Indigestion. -Start Hydroxyzine 25 mg TID PRN for Anxiety. -Start Trazodone 50 mg QHS PRN for sleep.  Harlow Asa, MD, FAPA 10/09/2020, 7:05 AM

## 2020-10-09 NOTE — Progress Notes (Addendum)
      10/09/20 1300  Psych Admission Type (Psych Patients Only)  Admission Status Voluntary  Psychosocial Assessment  Patient Complaints Substance abuse  Eye Contact Fair  Facial Expression Flat  Affect Depressed  Speech Logical/coherent  Interaction Minimal;Forwards little  Motor Activity Slow;Tremors  Appearance/Hygiene In scrubs  Behavior Characteristics Cooperative;Appropriate to situation  Mood Depressed;Anxious  Thought Process  Coherency WDL  Content WDL  Delusions None reported or observed  Perception WDL  Hallucination None reported or observed  Judgment Impaired  Confusion None  Danger to Self  Current suicidal ideation? Denies  Danger to Others  Danger to Others None reported or observed   D. Pt presents with a sad affect/ anxious mood-calm, cooperative behavior. Pt observed on the unit interacting appropriately with peers, but did not attend morning groups.  Pt currently denies SI/HI and AVH   A. Labs and vitals monitored. Pt given and educated on medications. Pt supported emotionally and encouraged to express concerns and ask questions.   R. Pt remains safe with 15 minute checks. Will continue POC.

## 2020-10-09 NOTE — BHH Group Notes (Signed)
Adult Psychoeducational Group Note  Date:  10/09/2020 Time:  10:05 AM  Group Topic/Focus:  Goals Group:   The focus of this group is to help patients establish daily goals to achieve during treatment and discuss how the patient can incorporate goal setting into their daily lives to aide in recovery.  Participation Level:  Did Not Attend  Participation Quality:   Did not attend  Affect:   Did not attend  Cognitive:   Did not attend  Insight: None  Engagement in Group:   Did not attend  Modes of Intervention:   Did not attend  Additional Comments:  Pt did not attend Goal group due to being asleep.  Shaylah Mcghie, Georgiann Mccoy 10/09/2020, 10:05 AM

## 2020-10-10 DIAGNOSIS — F1094 Alcohol use, unspecified with alcohol-induced mood disorder: Secondary | ICD-10-CM | POA: Diagnosis not present

## 2020-10-10 LAB — PROTIME-INR
INR: 0.9 (ref 0.8–1.2)
Prothrombin Time: 12.3 seconds (ref 11.4–15.2)

## 2020-10-10 LAB — APTT: aPTT: 28 seconds (ref 24–36)

## 2020-10-10 LAB — MAGNESIUM: Magnesium: 2.2 mg/dL (ref 1.7–2.4)

## 2020-10-10 LAB — BASIC METABOLIC PANEL
Anion gap: 10 (ref 5–15)
BUN: 14 mg/dL (ref 6–20)
CO2: 31 mmol/L (ref 22–32)
Calcium: 9.8 mg/dL (ref 8.9–10.3)
Chloride: 99 mmol/L (ref 98–111)
Creatinine, Ser: 1.33 mg/dL — ABNORMAL HIGH (ref 0.61–1.24)
GFR, Estimated: >60 mL/min (ref >60)
Glucose, Bld: 106 mg/dL — ABNORMAL HIGH (ref 70–99)
Potassium: 4.2 mmol/L (ref 3.5–5.1)
Sodium: 140 mmol/L (ref 135–145)

## 2020-10-10 LAB — HEPATIC FUNCTION PANEL
ALT: 67 U/L — ABNORMAL HIGH (ref 0–44)
AST: 105 U/L — ABNORMAL HIGH (ref 15–41)
Albumin: 4 g/dL (ref 3.5–5.0)
Alkaline Phosphatase: 123 U/L (ref 38–126)
Bilirubin, Direct: 0.2 mg/dL (ref 0.0–0.2)
Indirect Bilirubin: 0.8 mg/dL (ref 0.3–0.9)
Total Bilirubin: 1 mg/dL (ref 0.3–1.2)
Total Protein: 7.2 g/dL (ref 6.5–8.1)

## 2020-10-10 MED ORDER — FLUOXETINE HCL 10 MG PO CAPS
30.0000 mg | ORAL_CAPSULE | Freq: Every day | ORAL | Status: DC
  Administered 2020-10-11 – 2020-10-12 (×2): 30 mg via ORAL
  Filled 2020-10-10 (×4): qty 3

## 2020-10-10 MED ORDER — LOPERAMIDE HCL 2 MG PO CAPS: 2.0000 mg | ORAL_CAPSULE | ORAL | Status: AC | PRN

## 2020-10-10 MED ORDER — ONDANSETRON 4 MG PO TBDP: 4.0000 mg | ORAL_TABLET | Freq: Four times a day (QID) | ORAL | Status: AC | PRN

## 2020-10-10 MED ORDER — FLUOXETINE HCL 10 MG PO CAPS
10.0000 mg | ORAL_CAPSULE | Freq: Once | ORAL | Status: AC
  Administered 2020-10-10: 10 mg via ORAL
  Filled 2020-10-10 (×2): qty 1

## 2020-10-10 MED ORDER — HYDROXYZINE HCL 25 MG PO TABS
25.0000 mg | ORAL_TABLET | Freq: Four times a day (QID) | ORAL | Status: AC | PRN
  Administered 2020-10-10 – 2020-10-12 (×5): 25 mg via ORAL
  Filled 2020-10-10 (×5): qty 1

## 2020-10-10 NOTE — Group Note (Unsigned)
Recreation Therapy Group Note   Group Topic:Stress Management  Group Date: 10/10/2020 Start Time: 0930 End Time: 0945 Facilitators: Vikki Ports, NT Location: [address] Dayroom       Affect/Mood: {RT BHH Affect/Mood:26271}   Participation Level: {RT BHH Participation [employer]   Participation Quality: {RT BHH Participation Quality:26268}   Behavior: {RT BHH Group Behavior:26269}   Speech/Thought Process: {RT BHH Speech/Thought:26276}   Insight: {RT BHH Insight:26272}   Judgement: {RT BHH Judgement:26278}   Modes of Intervention: {RT BHH Modes of Intervention:26277}   Patient Response to Interventions:  {RT BHH Patient Response to Intervention:26274}   Education Outcome:  {RT [place] Education Outcome:26279}   Clinical Observations/Individualized Feedback: *** was *** in their participation of session activities and group discussion. Pt identified ***   Plan: {RT BHH Tx WR:[id number]   Vikki Ports, NT,  10/10/2020 11:18 AM

## 2020-10-10 NOTE — Progress Notes (Signed)
Patient did attend the evening speaker AA meeting.  

## 2020-10-10 NOTE — Group Note (Signed)
Recreation Therapy Group Note   Group Topic:Stress Management  Group Date: 10/10/2020 Start Time: 0930 End Time: 0945 Facilitators: Victorino Sparrow, LRT/CTRS Location: [address] Dayroom  Goal Area(s) Addresses:  Patient will identify positive stress management techniques. Patient will identify benefits of using stress management post d/c.  Group Description: LRT played a meditation that focused on setting personal boundaries.  The meditation spoke on the importance of putting your feelings and what you want even if it makes others upset.  It also expressed your self care is more important as your want to please others.   Affect/Mood: Appropriate   Participation Level: Engaged   Participation Quality: Independent   Behavior: Appropriate   Speech/Thought Process: Focused   Insight: Good   Judgement: Good   Modes of Intervention: Meditation   Patient Response to Interventions:  Attentive and Engaged   Education Outcome:  Acknowledges education and In group clarification offered    Clinical Observations/Individualized Feedback: Pt was appropriate during group session.  Pt expressed no concerns and asked no questions.     Plan: Continue to engage patient in RT group sessions 2-3x/week.   Victorino Sparrow, LRT/CTRS 10/10/2020 11:53 AM

## 2020-10-10 NOTE — Group Note (Signed)
Occupational Therapy Group Note  Group Topic:Feelings Management  Group Date: 10/10/2020 Start Time: 1400 End Time: 1450 Facilitators: Ponciano Ort, OT/L    Group Description: Group encouraged increased participation and engagement through discussion focused on STRENGTHS. Patients were encouraged to fill out a worksheet to structure discussion, identifying ten strengths that they currently have. Patients were then instructed to utilize various symbols to identify which strengths help them in their relationships, school/profession, and leisure participation. Discussion also focused on identifying one strength they do not currently have but would like. Discussion within the group followed with patients sharing their responses and highlighting their own personal strengths.   Therapeutic Goals: Identify strengths vs weaknesses Discuss and identify ways we can highlight our strengths  Participation Level: Minimal   Participation Quality: Moderate Cues   Behavior: Calm and Cooperative   Speech/Thought Process: Focused   Affect/Mood: Constricted   Insight: Limited   Judgement: Limited   Individualization: Brandon Miller was minimally engaged in their participation of group discussion/activity. Pt sat quietly in group space, declined to engage in discussion, however did share during introductions. Pt identified "common sense" as a current strength that he possesses.   Modes of Intervention: Activity, Discussion, and Education  Patient Response to Interventions:  Attentive   Plan: Continue to engage patient in OT groups 2 - 3x/week.  10/10/2020  Ponciano Ort, OT/L

## 2020-10-10 NOTE — Plan of Care (Signed)
Nurse discussed coping skills with patient.  

## 2020-10-10 NOTE — Progress Notes (Addendum)
[facility] MD Progress Note  10/10/2020 2:41 PM Taysean Wager  MRN:  [id number]  Chief Complaint: SI and alcohol abuse  Reason for admission: Kimball is a 55 year old male with a psychiatric history of alcohol use disorder- severe/dependent and depression, and a medical history of diabetes mellitus, who initially presented to [facility] ED for evaluation of suicidal ideation with a plan to shoot himself if he goes home.The patient is currently on Hospital Day 3.   Chart Review from last 24 hours: The patient's chart was reviewed and nursing notes were reviewed. The patient's case was discussed in multidisciplinary team meeting.  He had no acute safety or behavioral concerns noted. Per [facility] he was compliant with his medications.  Did not receive any as needed medication.  Patient most recent CIWA was 7(anxiety).  Patient continue to attend and engage in groups. Per Education officer, museum note patient's sister is concerned about his mental health and drinking problem and she plans to speak with an attorney today to get power of attorney.  Information Obtained Today During Patient Interview: The patient was seen and evaluated on the unit by me, Dr. Nelda Marseille and Dr. Berdine Addison. On assessment today the patient reports that he is feeling improved in terms of his mood but overall still feels depressed and anxious. He he is still not sure about inpatient rehab or outpatient program. He denies SI, HI, AVH, paranoia, or delusions.  He feels his tremors have improved.  He reports some alcohol craving.  He states that currently his condition is more of a mental thing than alcohol problem.he reports has some mental block.  He states that he feels a little off.  Discussed about increasing Prozac.  He agrees to the plan.  He states that somebody has said to him about lupus in the past.  Discussed about checking ANA and following up with rheumatology at discharge.  Patient verbalizes understanding.  He denies other signs of withdrawal at  this time.  He has been eating and sleeping well.  He denies medication side-effects.    Principal Problem: Alcohol-induced mood disorder with depressive symptoms (HCC) Diagnosis: Principal Problem:   Alcohol-induced mood disorder with depressive symptoms (HCC) Active Problems:   Diabetes mellitus, type 2 (HCC)   Elevated LFTs   Alcohol dependence ([place])   GERD (gastroesophageal reflux disease)   History of seizures  Total Time spent with patient: I personally spent 25 minutes on the unit in direct patient care. The direct patient care time included face-to-face time with the patient, reviewing the patient's chart, communicating with other professionals, and coordinating care. Greater than 50% of this time was spent in counseling or coordinating care with the patient regarding goals of hospitalization, psycho-education, and discharge planning needs.   Past Psychiatric History: Alcohol dependence, elevated LFTs, alcohol induced mood disorder. He has been complaint with his medication. He has been taking Prozac 20 mg daily.  He states he has tried Dentist  Past Medical History:  Past Medical History:  Diagnosis Date   Allergy    Anemia    Anxiety    Arthritis    RA   Complication of anesthesia    hard to wake up-was told he may have sleep apnea-never tested   Depression    Diabetes mellitus without complication ([place])    with weight no more problems   Seizure ([place])    had one seizure of unknown etiology a few tears ago, none since and on no meds for them  Sleep apnea    Snores     Past Surgical History:  Procedure Laterality Date   BIOPSY  02/12/2019   Procedure: BIOPSY;  Surgeon: Daneil Dolin, MD;  [facility];  Service: Endoscopy;;  stomach   COLONOSCOPY WITH PROPOFOL N/A 02/12/2019   Procedure: COLONOSCOPY WITH PROPOFOL;  Surgeon: Daneil Dolin, MD;  [facility];  Service: Endoscopy;  Laterality: N/A;  10:30am   ELBOW SURGERY Right 2010    Dr. Percell Miller   ESOPHAGOGASTRODUODENOSCOPY (EGD) WITH PROPOFOL N/A 02/12/2019   Procedure: ESOPHAGOGASTRODUODENOSCOPY (EGD) WITH PROPOFOL;  Surgeon: Daneil Dolin, MD;  [facility];  Service: Endoscopy;  Laterality: N/A;   HERNIA REPAIR     Right inguinal   KNEE ARTHROSCOPY WITH MEDIAL MENISECTOMY Right 06/11/2013   Procedure: RIGHT KNEE ARTHROSCOPY WITH MEDIAL MENISECTOMY AND CHONDROPLASTY;  Surgeon: Ninetta Lights, MD;  Location: [place];  Service: Orthopedics;  Laterality: Right;  Chondroplasty, loose bodies, medial menisectomy, lysis of adhesions.   KNEE SURGERY Right 2011   POLYPECTOMY  02/12/2019   Procedure: POLYPECTOMY;  Surgeon: Daneil Dolin, MD;  [facility];  Service: Endoscopy;;   right ankle     SHOULDER SURGERY Right 2010   Dr. Percell Miller   TOTAL KNEE ARTHROPLASTY Right 08/25/2014   Procedure: TOTAL KNEE ARTHROPLASTY;  Surgeon: Ninetta Lights, MD;  Location: [place];  Service: Orthopedics;  Laterality: Right;   Family History:  Family History  Problem Relation Age of Onset   Hepatitis Mother    Cirrhosis Mother        Did not drink   Pneumonia Father    Kidney failure Father    Gastric cancer Brother        ? Gastric CA spread to esophagus   Colon cancer Neg Hx    Family Psychiatric  History:see H&P  Social History:  Social History   Substance and Sexual Activity  Alcohol Use Yes   Comment: 10-20/day cans beer     Social History   Substance and Sexual Activity  Drug Use No    Social History   Socioeconomic History   Marital status: Single    Spouse name: Not on file   Number of children: Not on file   Years of education: Not on file   Highest education level: Not on file  Occupational History   Occupation: unemployed  Tobacco Use   Smoking status: Every Day    Packs/day: 1.50    Years: 25.00    Pack years: 37.50    Types: Cigarettes   Smokeless tobacco: Never  Vaping Use   Vaping Use: Never used  Substance  and Sexual Activity   Alcohol use: Yes    Comment: 10-20/day cans beer   Drug use: No   Sexual activity: Yes  Other Topics Concern   Not on file  Social History Narrative   Not on file   Social Determinants of Health   Financial Resource Strain: Not on file  Food Insecurity: Not on file  Transportation Needs: Not on file  Physical Activity: Not on file  Stress: Not on file  Social Connections: Not on file   Additional Social History:   Sleep: good  Appetite:  Good  Current Medications: Current Facility-Administered Medications  Medication Dose Route Frequency Provider Last Rate Last Admin   alum & mag hydroxide-simeth (MAALOX/MYLANTA) 200-200-20 MG/5ML suspension 30 mL  30 mL Oral Q4H PRN Prescilla Sours, PA-C       [  START ON 10/11/2020] FLUoxetine (PROZAC) capsule 30 mg  30 mg Oral Daily Armando Reichert, MD       folic acid (FOLVITE) tablet 1 mg  1 mg Oral Daily Rosita Kea, Vandana, MD   1 mg at 10/10/20 0900   gabapentin (NEURONTIN) capsule 300 mg  300 mg Oral TID Harlow Asa, MD   300 mg at 10/10/20 1152   hydrOXYzine (ATARAX/VISTARIL) tablet 25 mg  25 mg Oral Q6H PRN Harlow Asa, MD       loperamide (IMODIUM) capsule 2-4 mg  2-4 mg Oral PRN Harlow Asa, MD       Derrill Memo ON 10/11/2020] LORazepam (ATIVAN) tablet 1 mg  1 mg Oral Daily Doda, Vandana, MD       LORazepam (ATIVAN) tablet 1 mg  1 mg Oral Q6H PRN Nelda Marseille, Bobbye Petti E, MD       magnesium hydroxide (MILK OF MAGNESIA) suspension 30 mL  30 mL Oral Daily PRN Margorie John W, PA-C       multivitamin with minerals tablet 1 tablet  1 tablet Oral Daily Lovena Le, Cody W, PA-C   1 tablet at 10/10/20 0900   nicotine (NICODERM CQ - dosed in mg/24 hr) patch 7 mg  7 mg Transdermal Daily Nelda Marseille, Yarel Kilcrease E, MD   7 mg at 10/10/20 0900   ondansetron (ZOFRAN-ODT) disintegrating tablet 4 mg  4 mg Oral Q6H PRN Harlow Asa, MD       pantoprazole (PROTONIX) EC tablet 40 mg  40 mg Oral Daily Nimrod Wendt E, MD   40 mg at 10/10/20 0900    thiamine tablet 100 mg  100 mg Oral Daily Margorie John W, PA-C   100 mg at 10/10/20 0900   traZODone (DESYREL) tablet 100 mg  100 mg Oral QHS Margorie John W, PA-C   100 mg at 10/09/20 2206    Lab Results:  Results for orders placed or performed during the hospital encounter of 10/07/20 (from the past 48 hour(s))  Comprehensive metabolic panel     Status: Abnormal   Collection Time: 10/09/20  6:36 AM  Result Value Ref Range   Sodium 139 135 - 145 mmol/L   Potassium 4.1 3.5 - 5.1 mmol/L   Chloride 101 98 - 111 mmol/L   CO2 27 22 - 32 mmol/L   Glucose, Bld 101 (H) 70 - 99 mg/dL    Comment: Glucose reference range applies only to samples taken after fasting for at least 8 hours.   BUN 10 6 - 20 mg/dL   Creatinine, Ser 0.96 0.61 - 1.24 mg/dL   Calcium 9.3 8.9 - 10.3 mg/dL   Total Protein 7.0 6.5 - 8.1 g/dL   Albumin 4.0 3.5 - 5.0 g/dL   AST 150 (H) 15 - 41 U/L   ALT 75 (H) 0 - 44 U/L   Alkaline Phosphatase 115 38 - 126 U/L   Total Bilirubin 2.1 (H) 0.3 - 1.2 mg/dL   GFR, Estimated >60 >60 mL/min    Comment: (NOTE) Calculated using the CKD-EPI Creatinine Equation (2021)    Anion gap 11 5 - 15    Comment: Performed at [facility], [place] [address], [place], [place] [address]  APTT     Status: None   Collection Time: 10/10/20  6:20 AM  Result Value Ref Range   aPTT 28 24 - 36 seconds    Comment: Performed at [facility], [place] [address] [place], [place] [address]  Protime-INR  Status: None   Collection Time: 10/10/20  6:20 AM  Result Value Ref Range   Prothrombin Time 12.3 11.4 - 15.2 seconds   INR 0.9 0.8 - 1.2    Comment: (NOTE) INR goal varies based on device and disease states. Performed at [facility], [place] [address], [place], [place] [address]   Hepatic function panel     Status: Abnormal   Collection Time: 10/10/20  6:20 AM  Result Value Ref Range   Total Protein 7.2 6.5 - 8.1 g/dL   Albumin 4.0 3.5 - 5.0  g/dL   AST 105 (H) 15 - 41 U/L   ALT 67 (H) 0 - 44 U/L   Alkaline Phosphatase 123 38 - 126 U/L   Total Bilirubin 1.0 0.3 - 1.2 mg/dL   Bilirubin, Direct 0.2 0.0 - 0.2 mg/dL   Indirect Bilirubin 0.8 0.3 - 0.9 mg/dL    Comment: Performed at [facility], [place] [address], [place], [place] [address]    Blood Alcohol level:  Lab Results  Component Value Date   ETH 270 (H) 10/06/2020   ETH 188 (H) 29/24/4628    Metabolic Disorder Labs: Lab Results  Component Value Date   HGBA1C 4.3 (L) 10/08/2020   MPG 76.71 10/08/2020   MPG 85.32 05/19/2020   No results found for: PROLACTIN Lab Results  Component Value Date   CHOL 122 10/08/2020   TRIG 62 10/08/2020   HDL 61 10/08/2020   CHOLHDL 2.0 10/08/2020   VLDL 12 10/08/2020   LDLCALC 49 10/08/2020    Physical Findings: AIMS: Facial and Oral Movements Muscles of Facial Expression: None, normal Lips and Perioral Area: None, normal Jaw: None, normal Tongue: None, normal,Extremity Movements Upper (arms, wrists, hands, fingers): None, normal Lower (legs, knees, ankles, toes): None, normal, Trunk Movements Neck, shoulders, hips: None, normal, Overall Severity Severity of abnormal movements (highest score from questions above): None, normal Incapacitation due to abnormal movements: None, normal Patient's awareness of abnormal movements (rate only patient's report): No Awareness, Dental Status Current problems with teeth and/or dentures?: Yes Does patient usually wear dentures?: No  CIWA:  CIWA-Ar Total: 7   Musculoskeletal: Strength & Muscle Tone: normal Gait & Station:  normal Patient leans: N/A  Psychiatric Specialty Exam:  Presentation  General Appearance: Appropriate for environment.  Eye Contact:Fair  Speech:Clear and Coherent  Speech Volume: Normal  Handedness:Right   Mood and Affect  Mood:dysphoric   Affect:Constricted   Thought Process  Thought Processes:Coherent;  Linear  Descriptions of Associations:Intact  Orientation:Full (Time, Place and Person)  Thought Content:Logical - no evidence of delusions, paranoia or psychosis on exam  History of Schizophrenia/Schizoaffective disorder:No data recorded Duration of Psychotic Symptoms:No data recorded Hallucinations:Hallucinations: None  Ideas of Reference:None  Suicidal Thoughts:Suicidal Thoughts: No  Homicidal Thoughts:Homicidal Thoughts: No   Sensorium  Memory:Immediate Fair; Recent Fair; Remote Fair  Judgment:Improving   Insight:Fair   Executive Functions  Concentration:Good  Attention Span:Good  [place]  Language:Good   Psychomotor Activity  Psychomotor Activity:Psychomotor Activity: Normal   Assets  Assets:Communication Skills; Desire for Improvement; Financial Resources/Insurance; Resilience   Sleep  Sleep:Sleep: Good Number of Hours of Sleep: 5.75    Physical Exam: Physical Exam Vitals and nursing note reviewed.  Constitutional:      General: He is not in acute distress.    Appearance: Normal appearance.  HENT:     Head: Normocephalic and atraumatic.     Mouth/Throat:     Comments: Poor dentition  Pulmonary:     Effort: Pulmonary effort is normal.  Neurological:     General: No focal deficit present.     Mental Status: He is alert.   Review of Systems  Constitutional:  Negative for chills and diaphoresis.  Respiratory:  Negative for shortness of breath.   Cardiovascular:  Negative for chest pain.  Gastrointestinal:  Negative for abdominal pain, constipation, diarrhea, nausea and vomiting.  Genitourinary: Negative.   Neurological:  Negative for dizziness, tremors and headaches.  Psychiatric/Behavioral:  The patient is nervous/anxious.   Blood pressure 123/66, pulse (!) 42, temperature 97.9 F (36.6 C), temperature source Oral, resp. rate 18, height 6\' 3"  (1.905 m), weight 90.7 kg, SpO2 100 %. Body mass index is 25  kg/m.   Treatment Plan Summary: Daily contact with patient to assess and evaluate symptoms and progress in treatment  Alcohol induced depressive d/o (r/o MDD) -Increase Prozac to 30 mg daily   Alcohol use d/o - severe dependence Last CIWA 7 (anxiety) -Continue Ativan scheduled taper to be finished on 9/20. CIWA protocol with additional Ativan 1mg  for scores >10 -Continue Thiamine, multivitamin, folate oral replacement -Continue gabapentin 300 mg 3 times daily for alcohol craving, anxiety, and potential seizures - SW to assist with referrals to [facility] and patient counseled on the need to abstain from alcohol use.  Patient declines residential rehab referral. - Continue Protonix 40mg  daily for GI protection   Transaminitis - 9/19 - AST 105 and ALT 67 (trending down) - PT/INR 12.3/0.9, PTT 28 -Hep A, hep C, hep B nonreactive on 4/28 - will need outpatient f/u after discharge -Will Continue to trend LFTs.  H/o high TSH On 4/28 -TSH 5.479, T4 low at 0.59, T3 uptake ratio high at 45. On 9/17 TSH 4.046 - patient currently off Synthroid and advised to f/u with PCP for monitoring after discharge  Thrombocytopenia Platelets 100 on 9/18 and on 9/17 - Checking ANA - Appears chronic; -will need outpatient f/u after discharge   H/o withdrawal seizures Patient had withdrawal seizure 2 years ago, no other seizures outside of alcohol use.  Patient has not taken Keppra since June - Continue Neurontin 300mg  tid  - Seizure precautions   Hyponatremia, resolved -Na 139 (increased from 132) - Encouraging po hydration and increase po intake -Continue Ensure.   Nicotine dependence -Nicotine Patch 7 mg daily  Asymptomatic bradycardia - Rechecking EKG   PRN's -Continue Milk of Magnesia 30 ml PRN Daily for Constipation. -Continue Maalox/Mylanta 30 ml Q4H PRN for Indigestion. -Start Hydroxyzine 25 mg TID PRN for Anxiety. -Start Trazodone 50 mg QHS PRN for sleep.  Armando Reichert, MD, PGY  2 10/10/2020, 2:41 PM

## 2020-10-10 NOTE — Progress Notes (Signed)
D:  Patient's self inventory sheet, patient sleeps good, sleep medication helpful.  Good appetite, low energy level, good concentration.  Rated depression and hopeless 7, anxiety 8.  Withdrawals, tremors.  Denied SI.  Denied physical problems.  Denied physical pain.  Goal is figure out what to do next.  Plans to talk to MD/SW.  No discharge plans. A:  Medications administered per MD orders.  Emotional support and encouragement given patient. R:  Denied SI and HI, contracts for safety.  Denied A/V hallucinations.  Safety maintained with 15 minute checks

## 2020-10-10 NOTE — Group Note (Signed)
[facility] LCSW Group Therapy Note    Group Date: 10/10/2020 Start Time: 1300 End Time: 1400  Type of Therapy and Topic:  Group Therapy:  Overcoming Obstacles  Participation Level:  [facility] PARTICIPATION LEVEL: Active  Mood:  Description of Group:   In this group patients will be encouraged to explore what they see as obstacles to their own wellness and recovery. They will be guided to discuss their thoughts, feelings, and behaviors related to these obstacles. The group will process together ways to cope with barriers, with attention given to specific choices patients can make. Each patient will be challenged to identify changes they are motivated to make in order to overcome their obstacles. This group will be process-oriented, with patients participating in exploration of their own experiences as well as giving and receiving support and challenge from other group members.  Therapeutic Goals: 1. Patient will identify personal and current obstacles as they relate to admission. 2. Patient will identify barriers that currently interfere with their wellness or overcoming obstacles.  3. Patient will identify feelings, thought process and behaviors related to these barriers. 4. Patient will identify two changes they are willing to make to overcome these obstacles:    Summary of Patient Progress   Pt attended the group and stated that he can control his dog.  The Pt accepted the worksheets that were provided and followed along with the group.   Therapeutic Modalities:   Cognitive Behavioral Therapy Solution Focused Therapy Motivational Interviewing Relapse Prevention Therapy   Darleen Crocker, LCSWA

## 2020-10-10 NOTE — Plan of Care (Signed)
Plan of care note  Pt's PR 42/min. EKG ordered which showed sinus bradycardia with sinus arrhythmia with Vent rate 49/min. Pt assessed by me personally and he denied any symptoms other than feeling dizzy when he gets up in the morning. He denies chest pain, dizziness or shortness of breath currently. His BP stable at 123/66 mmHg.  [employer]. Recommended monitoring vitals and calling cardiology if PR goes in 30's or Pt constantly feels dizzy and not just in morning. Doesn't recommend any interventions at this time as BP stable and no symptoms.   Repeat Vitals at  1709 showed BP 150/75 mmHg and PR 91/min.   Dr Armando Reichert MD [facility]

## 2020-10-11 DIAGNOSIS — R419 Unspecified symptoms and signs involving cognitive functions and awareness: Secondary | ICD-10-CM | POA: Diagnosis present

## 2020-10-11 LAB — ANA W/REFLEX IF POSITIVE: Anti Nuclear Antibody (ANA): POSITIVE — AB

## 2020-10-11 LAB — ENA+DNA/DS+ANTICH+CENTRO+JO...
Anti JO-1: <0.2 AI (ref 0.0–0.9)
Centromere Ab Screen: <0.2 AI (ref 0.0–0.9)
Chromatin Ab SerPl-aCnc: <0.2 AI (ref 0.0–0.9)
ENA SM Ab Ser-aCnc: <0.2 AI (ref 0.0–0.9)
Ribonucleic Protein: 6.8 AI — ABNORMAL HIGH (ref 0.0–0.9)
SSA (Ro) (ENA) Antibody, IgG: <0.2 AI (ref 0.0–0.9)
SSB (La) (ENA) Antibody, IgG: <0.2 AI (ref 0.0–0.9)
Scleroderma (Scl-70) (ENA) Antibody, IgG: <0.2 AI (ref 0.0–0.9)
ds DNA Ab: <1 IU/mL (ref 0–9)

## 2020-10-11 MED ORDER — OMEGA-3-ACID ETHYL ESTERS 1 G PO CAPS
1.0000 g | ORAL_CAPSULE | Freq: Two times a day (BID) | ORAL | Status: DC
  Administered 2020-10-11 – 2020-10-17 (×12): 1 g via ORAL
  Filled 2020-10-11 (×17): qty 1

## 2020-10-11 MED ORDER — B COMPLEX-C PO TABS
1.0000 | ORAL_TABLET | Freq: Every day | ORAL | Status: DC
  Administered 2020-10-12 – 2020-10-17 (×6): 1 via ORAL
  Filled 2020-10-11 (×8): qty 1

## 2020-10-11 NOTE — Plan of Care (Signed)
Nurse discussed anxiety, depression and coping skills with patient.  
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
## 2020-10-11 NOTE — [facility] Group Notes (Signed)
Adult Psychoeducational Group Note  Date:  10/11/2020 Time:  8:46 PM  Group Topic/Focus:  Diagnosis Education:   The focus of this group is to discuss the major disorders that patients maybe diagnosed with.  Group discusses the importance of knowing what one's diagnosis is so that one can understand treatment and better advocate for oneself.  Participation Level:  Active  Participation Quality:  Appropriate  Affect:  Appropriate  Cognitive:  Appropriate  Insight: Good  Engagement in Group:  Engaged  Modes of Intervention:  Discussion  Additional Comments  Dalene Carrow 10/11/2020, 8:46 PM
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
## 2020-10-11 NOTE — [facility] Group Notes (Signed)
Pt did not attend morning group.

## 2020-10-11 NOTE — Progress Notes (Signed)
D:  Patient denied SI and HI, contracts for safety.  Denied A/v hallucinations.  Denied pain. A:  Medications administered per MD orders.  Emotional support and encouragement given patient. R:  Safety maintained with 15 minute checks.

## 2020-10-11 NOTE — Progress Notes (Addendum)
[facility] MD Progress Note  10/11/2020 5:59 PM Alison Breeding  MRN:  [id number]  Chief Complaint: SI and alcohol abuse  Reason for admission: Brandon Miller is a 55 year old male with a psychiatric history of alcohol use disorder- severe/dependent and depression, and a medical history of diabetes mellitus, who initially presented to [facility] for evaluation of suicidal ideation with a plan to shoot himself if he goes home.The patient is currently on Hospital Day 4.   Chart Review from last 24 hours: The patient's chart was reviewed and nursing notes were reviewed. The patient's case was discussed in multidisciplinary team meeting.  He had no acute safety or behavioral concerns noted. Per [facility] he was compliant with his medications.  He received as needed hydroxyzine.  Patient most recent CIWA was 1(anxiety).  Patient continue to attend and engage in groups.  Information Obtained Today During Patient Interview: The patient was seen and evaluated on the unit. On assessment today the patient reports that he is feeling 'good "but overall still feels depressed and anxious.  He feels that his mood has been improving.  He states that his anxiety has been up and down.  He still has some tremors.  He states he is interested in outpatient program. He denies SI, HI, AVH, paranoia, or delusions.  He does not report alcohol craving.  He has been tolerating his medication without any side effects.  He denies other signs of withdrawal at this time except mild tremors and anxiety.  He has been eating and sleeping well.  He denies medication side-effects.  He states that he has a house in [place] which does not have any heat.  He states he may go to his sister's house in [place] after discharge.  Principal Problem: Alcohol-induced mood disorder with depressive symptoms (HCC) Diagnosis: Principal Problem:   Alcohol-induced mood disorder with depressive symptoms (HCC) Active Problems:   Diabetes mellitus, type 2  (HCC)   Elevated LFTs   Alcohol dependence ([place])   GERD (gastroesophageal reflux disease)   History of seizures  Total Time spent with patient: I personally spent 25 minutes on the unit in direct patient care. The direct patient care time included face-to-face time with the patient, reviewing the patient's chart, communicating with other professionals, and coordinating care. Greater than 50% of this time was spent in counseling or coordinating care with the patient regarding goals of hospitalization, psycho-education, and discharge planning needs.   Past Psychiatric History: Alcohol dependence, elevated LFTs, alcohol induced mood disorder. He has been complaint with his medication. He has been taking Prozac 20 mg daily.  He states he has tried Dentist  Past Medical History:  Past Medical History:  Diagnosis Date   Allergy    Anemia    Anxiety    Arthritis    RA   Complication of anesthesia    hard to wake up-was told he may have sleep apnea-never tested   Depression    Diabetes mellitus without complication ([place])    with weight no more problems   Seizure ([place])    had one seizure of unknown etiology a few tears ago, none since and on no meds for them   Sleep apnea    Snores     Past Surgical History:  Procedure Laterality Date   BIOPSY  02/12/2019   Procedure: BIOPSY;  Surgeon: Daneil Dolin, MD;  [facility];  Service: Endoscopy;;  stomach   COLONOSCOPY WITH PROPOFOL N/A 02/12/2019   Procedure: COLONOSCOPY  WITH PROPOFOL;  Surgeon: Daneil Dolin, MD;  [facility];  Service: Endoscopy;  Laterality: N/A;  10:30am   ELBOW SURGERY Right 2010   Dr. Percell Miller   ESOPHAGOGASTRODUODENOSCOPY (EGD) WITH PROPOFOL N/A 02/12/2019   Procedure: ESOPHAGOGASTRODUODENOSCOPY (EGD) WITH PROPOFOL;  Surgeon: Daneil Dolin, MD;  [facility];  Service: Endoscopy;  Laterality: N/A;   HERNIA REPAIR     Right inguinal   KNEE ARTHROSCOPY WITH MEDIAL  MENISECTOMY Right 06/11/2013   Procedure: RIGHT KNEE ARTHROSCOPY WITH MEDIAL MENISECTOMY AND CHONDROPLASTY;  Surgeon: Ninetta Lights, MD;  Location: [facility];  Service: Orthopedics;  Laterality: Right;  Chondroplasty, loose bodies, medial menisectomy, lysis of adhesions.   KNEE SURGERY Right 2011   POLYPECTOMY  02/12/2019   Procedure: POLYPECTOMY;  Surgeon: Daneil Dolin, MD;  [facility];  Service: Endoscopy;;   right ankle     SHOULDER SURGERY Right 2010   Dr. Percell Miller   TOTAL KNEE ARTHROPLASTY Right 08/25/2014   Procedure: TOTAL KNEE ARTHROPLASTY;  Surgeon: Ninetta Lights, MD;  Location: [facility];  Service: Orthopedics;  Laterality: Right;   Family History:  Family History  Problem Relation Age of Onset   Hepatitis Mother    Cirrhosis Mother        Did not drink   Pneumonia Father    Kidney failure Father    Gastric cancer Brother        ? Gastric CA spread to esophagus   Colon cancer Neg Hx    Family Psychiatric  History:see H&P  Social History:  Social History   Substance and Sexual Activity  Alcohol Use Yes   Comment: 10-20/day cans beer     Social History   Substance and Sexual Activity  Drug Use No    Social History   Socioeconomic History   Marital status: Single    Spouse name: Not on file   Number of children: Not on file   Years of education: Not on file   Highest education level: Not on file  Occupational History   Occupation: unemployed  Tobacco Use   Smoking status: Every Day    Packs/day: 1.50    Years: 25.00    Pack years: 37.50    Types: Cigarettes   Smokeless tobacco: Never  Vaping Use   Vaping Use: Never used  Substance and Sexual Activity   Alcohol use: Yes    Comment: 10-20/day cans beer   Drug use: No   Sexual activity: Yes  Other Topics Concern   Not on file  Social History Narrative   Not on file   Social Determinants of Health   Financial Resource Strain: Not on file  Food Insecurity: Not on file   Transportation Needs: Not on file  Physical Activity: Not on file  Stress: Not on file  Social Connections: Not on file   Additional Social History:   Sleep: good  Appetite:  Good  Current Medications: Current Facility-Administered Medications  Medication Dose Route Frequency Provider Last Rate Last Admin   alum & mag hydroxide-simeth (MAALOX/MYLANTA) 200-200-20 MG/5ML suspension 30 mL  30 mL Oral Q4H PRN Lovena Le, Cody W, PA-C       FLUoxetine (PROZAC) capsule 30 mg  30 mg Oral Daily Doda, Vandana, MD   30 mg at 09/32/35 5732   folic acid (FOLVITE) tablet 1 mg  1 mg Oral Daily Doda, Vandana, MD   1 mg at 10/11/20 0900   gabapentin (NEURONTIN) capsule 300  mg  300 mg Oral TID Harlow Asa, MD   300 mg at 10/11/20 1707   hydrOXYzine (ATARAX/VISTARIL) tablet 25 mg  25 mg Oral Q6H PRN Harlow Asa, MD   25 mg at 10/11/20 2831   loperamide (IMODIUM) capsule 2-4 mg  2-4 mg Oral PRN Harlow Asa, MD       magnesium hydroxide (MILK OF MAGNESIA) suspension 30 mL  30 mL Oral Daily PRN Margorie John W, PA-C       multivitamin with minerals tablet 1 tablet  1 tablet Oral Daily Margorie John W, PA-C   1 tablet at 10/11/20 0900   nicotine (NICODERM CQ - dosed in mg/24 hr) patch 7 mg  7 mg Transdermal Daily Nelda Marseille, Amy E, MD   7 mg at 10/11/20 0900   ondansetron (ZOFRAN-ODT) disintegrating tablet 4 mg  4 mg Oral Q6H PRN Harlow Asa, MD       pantoprazole (PROTONIX) EC tablet 40 mg  40 mg Oral Daily Singleton, Amy E, MD   40 mg at 10/11/20 0900   thiamine tablet 100 mg  100 mg Oral Daily Margorie John W, PA-C   100 mg at 10/11/20 0900   traZODone (DESYREL) tablet 100 mg  100 mg Oral QHS Margorie John W, PA-C   100 mg at 10/10/20 2114    Lab Results:  Results for orders placed or performed during the hospital encounter of 10/07/20 (from the past 48 hour(s))  APTT     Status: None   Collection Time: 10/10/20  6:20 AM  Result Value Ref Range   aPTT 28 24 - 36 seconds    Comment:  Performed at [facility], [place] [address] [place], [place] [address]  Protime-INR     Status: None   Collection Time: 10/10/20  6:20 AM  Result Value Ref Range   Prothrombin Time 12.3 11.4 - 15.2 seconds   INR 0.9 0.8 - 1.2    Comment: (NOTE) INR goal varies based on device and disease states. Performed at [facility], [place] [address], [place], [place] [address]   Hepatic function panel     Status: Abnormal   Collection Time: 10/10/20  6:20 AM  Result Value Ref Range   Total Protein 7.2 6.5 - 8.1 g/dL   Albumin 4.0 3.5 - 5.0 g/dL   AST 105 (H) 15 - 41 U/L   ALT 67 (H) 0 - 44 U/L   Alkaline Phosphatase 123 38 - 126 U/L   Total Bilirubin 1.0 0.3 - 1.2 mg/dL   Bilirubin, Direct 0.2 0.0 - 0.2 mg/dL   Indirect Bilirubin 0.8 0.3 - 0.9 mg/dL    Comment: Performed at [facility], [place] [address], [place], [place] [address]  ANA w/Reflex if Positive     Status: Abnormal   Collection Time: 10/10/20  6:24 PM  Result Value Ref Range   Anti Nuclear Antibody (ANA) Positive (A) Negative    Comment: (NOTE) Performed At: [facility] [place], [place] [id number] [place] [facility] MD JJ:[id number]   Magnesium     Status: None   Collection Time: 10/10/20  6:24 PM  Result Value Ref Range   Magnesium 2.2 1.7 - 2.4 mg/dL    Comment: Performed at [facility], [place] [address] [place], [place] [address]  Basic metabolic panel     Status: Abnormal   Collection Time: 10/10/20  6:24 PM  Result Value Ref Range  Sodium 140 135 - 145 mmol/L   Potassium 4.2 3.5 - 5.1 mmol/L   Chloride 99 98 - 111 mmol/L   CO2 31 22 - 32 mmol/L   Glucose, Bld 106 (H) 70 - 99 mg/dL    Comment: Glucose reference range applies only to samples taken after fasting for at least 8 hours.   BUN 14 6 - 20 mg/dL   Creatinine, Ser 1.33 (H) 0.61 - 1.24 mg/dL   Calcium 9.8 8.9 - 10.3 mg/dL   GFR, Estimated >60 >60 mL/min    Comment:  (NOTE) Calculated using the CKD-EPI Creatinine Equation (2021)    Anion gap 10 5 - 15    Comment: Performed at [facility], [place] [address] [place], [place] [address]  ENA+DNA/DS+ANTICH+CENTRO+JO...     Status: Abnormal   Collection Time: 10/10/20  6:24 PM  Result Value Ref Range   ds DNA Ab <1 0 - 9 IU/mL    Comment: (NOTE)                                   Negative      <5                                   Equivocal  5 - 9                                   Positive      >9    Ribonucleic Protein 6.8 (H) 0.0 - 0.9 AI   ENA SM Ab Ser-aCnc <0.2 0.0 - 0.9 AI   Scleroderma (Scl-70) (ENA) Antibody, IgG <0.2 0.0 - 0.9 AI   SSA (Ro) (ENA) Antibody, IgG <0.2 0.0 - 0.9 AI   SSB (La) (ENA) Antibody, IgG <0.2 0.0 - 0.9 AI   Chromatin Ab SerPl-aCnc <0.2 0.0 - 0.9 AI   Anti JO-1 <0.2 0.0 - 0.9 AI   Centromere Ab Screen <0.2 0.0 - 0.9 AI   See below: Comment     Comment: (NOTE) Autoantibody                       Disease Association ------------------------------------------------------------                        Condition                  Frequency ---------------------   ------------------------   --------- Antinuclear Antibody,    SLE, mixed connective Direct (ANA-D)           tissue diseases ---------------------   ------------------------   --------- dsDNA                    SLE                        40 - 60% ---------------------   ------------------------   --------- Chromatin                Drug induced SLE                90%  SLE                        48 - 97% ---------------------   ------------------------   --------- SSA (Ro)                 SLE                        25 - 35%                         Sjogren's Syndrome         40 - 70%                         Neonatal Lupus                 100% ---------------------   ------------------------   --------- SSB (La)                 SLE                              10%                          Sjogren's Syndrome              30% ---------------------   -----------------------    --------- Sm (anti-Smith)          SLE                        15 - 30% ---------------------   -----------------------    --------- RNP                      Mixed Connective Tissue                         Disease                         95% (U1 nRNP,                SLE                        30 - 50% anti-ribonucleoprotein)  Polymyositis and/or                         Dermatomyositis                 20% ---------------------   ------------------------   --------- Scl-70 (antiDNA          Scleroderma (diffuse)      20 - 35% topoisomerase)           [place]                           13% ---------------------   ------------------------   --------- Jo-1                     Polymyositis and/or                         Dermatomyositis  20 - 40% ---------------------   ------------------------   --------- Centromere B             Scleroderma -  Crest                         variant                         80% Performed At: [facility] [place], [place] [id number] [facility] MD ID:[id number]     Blood Alcohol level:  Lab Results  Component Value Date   ETH 270 (H) 10/06/2020   ETH 188 (H) 44/31/5400    Metabolic Disorder Labs: Lab Results  Component Value Date   HGBA1C 4.3 (L) 10/08/2020   MPG 76.71 10/08/2020   MPG 85.32 05/19/2020   No results found for: PROLACTIN Lab Results  Component Value Date   CHOL 122 10/08/2020   TRIG 62 10/08/2020   HDL 61 10/08/2020   CHOLHDL 2.0 10/08/2020   VLDL 12 10/08/2020   LDLCALC 49 10/08/2020    Physical Findings: AIMS: Facial and Oral Movements Muscles of Facial Expression: None, normal Lips and Perioral Area: None, normal Jaw: None, normal Tongue: None, normal,Extremity Movements Upper (arms, wrists, hands, fingers): None, normal Lower (legs, knees, ankles, toes): None, normal, Trunk Movements Neck,  shoulders, hips: None, normal, Overall Severity Severity of abnormal movements (highest score from questions above): None, normal Incapacitation due to abnormal movements: None, normal Patient's awareness of abnormal movements (rate only patient's report): No Awareness, Dental Status Current problems with teeth and/or dentures?: Yes Does patient usually wear dentures?: No  CIWA:  CIWA-Ar Total: 1   Musculoskeletal: Strength & Muscle Tone: normal Gait & Station:  normal Patient leans: N/A  Psychiatric Specialty Exam:  Presentation  General Appearance: Appropriate for environment.  Eye Contact:Fair  Speech:Clear and Coherent  Speech Volume: Normal  Handedness:Right   Mood and Affect  Mood:dysphoric   Affect:Constricted   Thought Process  Thought Processes:Coherent; Linear  Descriptions of Associations:Intact  Orientation:Full (Time, Place and Person)  Thought Content:Logical  - no evidence of delusions, paranoia or psychosis on exam  History of Schizophrenia/Schizoaffective disorder:No data recorded Duration of Psychotic Symptoms:No data recorded Hallucinations:Hallucinations: None  Ideas of Reference:None  Suicidal Thoughts:Suicidal Thoughts: No  Homicidal Thoughts:Homicidal Thoughts: No   Sensorium  Memory:Immediate Fair; Recent Fair; Remote Fair  Judgment:Improving   Insight:Fair   Executive Functions  Concentration:Good  Attention Span:Good  [place]  Language:Good   Psychomotor Activity  Psychomotor Activity:Psychomotor Activity: Normal   Assets  Assets:Communication Skills; Desire for Improvement; Financial Resources/Insurance; Resilience   Sleep  Sleep:Sleep: Good Number of Hours of Sleep: 5.5    Physical Exam: Physical Exam Vitals and nursing note reviewed.  Constitutional:      General: He is not in acute distress.    Appearance: Normal appearance.  HENT:     Head: Normocephalic and  atraumatic.     Mouth/Throat:     Comments: Poor dentition Pulmonary:     Effort: Pulmonary effort is normal.  Neurological:     General: No focal deficit present.     Mental Status: He is alert.   Review of Systems  Constitutional:  Negative for chills and diaphoresis.  Respiratory:  Negative for shortness of breath.   Cardiovascular:  Negative for chest pain.  Gastrointestinal:  Negative for abdominal pain, constipation, diarrhea, nausea and vomiting.  Genitourinary: Negative.  Neurological:  Negative for dizziness, tremors and headaches.  Psychiatric/Behavioral:  The patient is nervous/anxious.   Blood pressure 108/66, pulse (!) 55, temperature 97.7 F (36.5 C), temperature source Oral, resp. rate 18, height 6\' 3"  (1.905 m), weight 90.7 kg, SpO2 100 %. Body mass index is 25 kg/m.   Treatment Plan Summary: Daily contact with patient to assess and evaluate symptoms and progress in treatment  Alcohol induced depressive d/o (r/o MDD) -Continue Prozac 30 mg daily   Alcohol use d/o - severe dependence  Last CIWA 1 (anxiety) -Patient completed Ativan taper today.  Discontinue CIWA and Ativan. -Continue B vitamin replacement multivitamin -Continue gabapentin 300 mg 3 times daily for alcohol craving, anxiety, and potential seizures - SW to assist with referrals to [facility] and patient counseled on the need to abstain from alcohol use.  Patient declines residential rehab referral. - Continue Protonix 40mg  daily for GI protection   Transaminitis - 9/19 - AST 105 and ALT 67 (trending down) - PT/INR 12.3/0.9, PTT 28 -Hep A, hep C, hep B nonreactive on 4/28 - will need outpatient f/u after discharge -Will Continue to trend LFTs.  H/o high TSH On 4/28 -TSH 5.479, T4 low at 0.59, T3 uptake ratio high at 45. On 9/17 TSH 4.046 - patient currently off Synthroid and advised to f/u with PCP for monitoring after discharge  Thrombocytopenia Platelets 100 on 9/18 and on 9/17 -ANA positive  for ribonucleoprotein 6.8.  Will recommend to follow-up with PCP at discharge. - Appears chronic; will need outpatient f/u after discharge   H/o withdrawal seizures Patient had withdrawal seizure 2 years ago, no other seizures outside of alcohol use.  Patient has not taken Keppra since June - Continue Neurontin 300mg  tid  - Seizure precautions   Hyponatremia, resolved -Na 139 (increased from 132) - Encouraging po hydration and increase po intake -Continue Ensure.   Nicotine dependence -Nicotine Patch 7 mg daily  Asymptomatic bradycardia -Pulse rate chronically low.  Most recent 55/min -EKG was done yesterday and medicine was consulted.  Recommended to call cardiology if pulse rate goes in the 30s or patient complains of any symptoms   PRN's -Continue Milk of Magnesia 30 ml PRN Daily for Constipation. -Continue Maalox/Mylanta 30 ml Q4H PRN for Indigestion. -Hydroxyzine 25 mg TID PRN for Anxiety. -Start Trazodone 50 mg QHS PRN for sleep.  Armando Reichert, MD, PGY 2 10/11/2020, 5:59 PM

## 2020-10-11 NOTE — Progress Notes (Addendum)
   10/10/20 2000  Psych Admission Type (Psych Patients Only)  Admission Status Voluntary  Psychosocial Assessment  Patient Complaints Anxiety;Depression;Substance abuse  Eye Contact Brief  Facial Expression Anxious  Affect Depressed;Anxious  Speech Logical/coherent  Interaction Minimal;Forwards little  Motor Activity Slow  Appearance/Hygiene Unremarkable  Behavior Characteristics Cooperative;Anxious  Mood Depressed;Anxious  Thought Process  Coherency WDL  Content WDL  Delusions None reported or observed  Perception WDL  Hallucination None reported or observed  Judgment Impaired  Confusion None  Danger to Self  Current suicidal ideation? Denies  Danger to Others  Danger to Others None reported or observed   Pt seen in hallway. Pt denies SI, HI, AVH. Rates pain 3-4/10 in his back. Rates anxiety 7/10 and depression 6/10. Pt denies cravings for alcohol. Says he is bored here. Withdrawal symptoms: tremors, anxiety. Takes meds as prescribed. Says he would benefit from a therapist outpatient. Says his plan upon discharge is to go to outpatient therapy.

## 2020-10-11 NOTE — [facility] Group Notes (Signed)
Pt attended group and participated in discussion. 

## 2020-10-12 ENCOUNTER — Encounter (HOSPITAL_COMMUNITY): Payer: Self-pay

## 2020-10-12 DIAGNOSIS — R419 Unspecified symptoms and signs involving cognitive functions and awareness: Secondary | ICD-10-CM

## 2020-10-12 DIAGNOSIS — F1024 Alcohol dependence with alcohol-induced mood disorder: Principal | ICD-10-CM

## 2020-10-12 LAB — HEPATIC FUNCTION PANEL
ALT: 74 U/L — ABNORMAL HIGH (ref 0–44)
AST: 85 U/L — ABNORMAL HIGH (ref 15–41)
Albumin: 4.2 g/dL (ref 3.5–5.0)
Alkaline Phosphatase: 133 U/L — ABNORMAL HIGH (ref 38–126)
Bilirubin, Direct: 0.1 mg/dL (ref 0.0–0.2)
Indirect Bilirubin: 0.6 mg/dL (ref 0.3–0.9)
Total Bilirubin: 0.7 mg/dL (ref 0.3–1.2)
Total Protein: 7.4 g/dL (ref 6.5–8.1)

## 2020-10-12 MED ORDER — SUCRALFATE 1 GM/10ML PO SUSP
1.0000 g | Freq: Three times a day (TID) | ORAL | Status: DC
  Administered 2020-10-12 – 2020-10-17 (×18): 1 g via ORAL
  Filled 2020-10-12 (×27): qty 10

## 2020-10-12 MED ORDER — FLUOXETINE HCL 20 MG PO CAPS
20.0000 mg | ORAL_CAPSULE | Freq: Every day | ORAL | Status: DC
  Filled 2020-10-12 (×2): qty 1

## 2020-10-12 MED ORDER — MIRTAZAPINE 15 MG PO TABS
15.0000 mg | ORAL_TABLET | Freq: Every day | ORAL | Status: DC
  Administered 2020-10-12: 15 mg via ORAL
  Filled 2020-10-12 (×4): qty 1

## 2020-10-12 NOTE — BH IP Treatment Plan (Signed)
Interdisciplinary Treatment and Diagnostic Plan Update  10/12/2020 Time of Session: 10:05am  Brandon Miller MRN: [id number]  Principal Diagnosis: Alcohol-induced mood disorder with depressive symptoms ([place])  Secondary Diagnoses: Principal Problem:   Alcohol-induced mood disorder with depressive symptoms ([place]) Active Problems:   Diabetes mellitus, type 2 (HCC)   Elevated LFTs   Alcohol dependence ([place])   GERD (gastroesophageal reflux disease)   History of seizures   Cognitive complaints   Current Medications:  Current Facility-Administered Medications  Medication Dose Route Frequency Provider Last Rate Last Admin   alum & mag hydroxide-simeth (MAALOX/MYLANTA) 200-200-20 MG/5ML suspension 30 mL  30 mL Oral Q4H PRN Margorie John W, PA-C       B-complex with vitamin C tablet 1 tablet  1 tablet Oral Daily Hill, Jackie Plum, MD   1 tablet at 10/12/20 0850   [START ON 10/13/2020] FLUoxetine (PROZAC) capsule 20 mg  20 mg Oral Daily Doda, Vandana, MD       gabapentin (NEURONTIN) capsule 300 mg  300 mg Oral TID Nelda Marseille, Amy E, MD   300 mg at 10/12/20 1247   hydrOXYzine (ATARAX/VISTARIL) tablet 25 mg  25 mg Oral Q6H PRN Harlow Asa, MD   25 mg at 10/12/20 3244   loperamide (IMODIUM) capsule 2-4 mg  2-4 mg Oral PRN Harlow Asa, MD       magnesium hydroxide (MILK OF MAGNESIA) suspension 30 mL  30 mL Oral Daily PRN Margorie John W, PA-C       multivitamin with minerals tablet 1 tablet  1 tablet Oral Daily Margorie John W, PA-C   1 tablet at 10/12/20 0102   nicotine (NICODERM CQ - dosed in mg/24 hr) patch 7 mg  7 mg Transdermal Daily Nelda Marseille, Amy E, MD   7 mg at 10/12/20 0850   omega-3 acid ethyl esters (LOVAZA) capsule 1 g  1 g Oral BID Maida Sale, MD   1 g at 10/12/20 0850   ondansetron (ZOFRAN-ODT) disintegrating tablet 4 mg  4 mg Oral Q6H PRN Harlow Asa, MD       pantoprazole (PROTONIX) EC tablet 40 mg  40 mg Oral Daily Nelda Marseille, Amy E, MD   40 mg at 10/12/20  0851   sucralfate (CARAFATE) 1 GM/10ML suspension 1 g  1 g Oral TID WC & HS Armando Reichert, MD       traZODone (DESYREL) tablet 100 mg  100 mg Oral QHS Lovena Le, Cody W, PA-C   100 mg at 10/11/20 2202   PTA Medications: Medications Prior to Admission  Medication Sig Dispense Refill Last Dose   FLUoxetine (PROZAC) 10 MG tablet Take 10 mg by mouth daily.      amphetamine-dextroamphetamine (ADDERALL) 20 MG tablet Take 20 mg by mouth 2 (two) times daily.      ibuprofen (ADVIL) 200 MG tablet Take 400 mg by mouth every 6 (six) hours as needed for headache or moderate pain.      levETIRAcetam (KEPPRA) 500 MG tablet Take 1 tablet (500 mg total) by mouth 2 (two) times daily. (Patient not taking: No sig reported) 60 tablet 0    levothyroxine (SYNTHROID) 25 MCG tablet Take 1 tablet (25 mcg total) by mouth daily at 6 (six) AM. (Patient not taking: No sig reported) 30 tablet 0    Multiple Vitamin (MULTIVITAMIN WITH MINERALS) TABS tablet Take 1 tablet by mouth daily. (Patient not taking: No sig reported)      Naphazoline HCl (CLEAR EYES OP) Apply 1 drop to  eye daily as needed (dry red eyes).      traZODone (DESYREL) 100 MG tablet Take 1 tablet (100 mg total) by mouth at bedtime as needed for sleep. (Patient taking differently: Take 100 mg by mouth at bedtime.) 30 tablet 0     Patient Stressors: Financial difficulties   Health problems   Occupational concerns   Substance abuse    Patient Strengths: Capable of independent living  Motivation for treatment/growth   Treatment Modalities: Medication Management, Group therapy, Case management,  1 to 1 session with clinician, Psychoeducation, Recreational therapy.   Physician Treatment Plan for Primary Diagnosis: Alcohol-induced mood disorder with depressive symptoms ([place]) Long Term Goal(s): Improvement in symptoms so as ready for discharge   Short Term Goals: Ability to identify changes in lifestyle to reduce recurrence of condition will improve Ability to  verbalize feelings will improve Ability to disclose and discuss suicidal ideas Ability to demonstrate self-control will improve Ability to identify and develop effective coping behaviors will improve Ability to maintain clinical measurements within normal limits will improve Compliance with prescribed medications will improve Ability to identify triggers associated with substance abuse/mental health issues will improve  Medication Management: Evaluate patient's response, side effects, and tolerance of medication regimen.  Therapeutic Interventions: 1 to 1 sessions, Unit Group sessions and Medication administration.  Evaluation of Outcomes: Progressing  Physician Treatment Plan for Secondary Diagnosis: Principal Problem:   Alcohol-induced mood disorder with depressive symptoms ([place]) Active Problems:   Diabetes mellitus, type 2 (HCC)   Elevated LFTs   Alcohol dependence (HCC)   GERD (gastroesophageal reflux disease)   History of seizures   Cognitive complaints  Long Term Goal(s): Improvement in symptoms so as ready for discharge   Short Term Goals: Ability to identify changes in lifestyle to reduce recurrence of condition will improve Ability to verbalize feelings will improve Ability to disclose and discuss suicidal ideas Ability to demonstrate self-control will improve Ability to identify and develop effective coping behaviors will improve Ability to maintain clinical measurements within normal limits will improve Compliance with prescribed medications will improve Ability to identify triggers associated with substance abuse/mental health issues will improve     Medication Management: Evaluate patient's response, side effects, and tolerance of medication regimen.  Therapeutic Interventions: 1 to 1 sessions, Unit Group sessions and Medication administration.  Evaluation of Outcomes: Progressing   RN Treatment Plan for Primary Diagnosis: Alcohol-induced mood disorder with  depressive symptoms ([place]) Long Term Goal(s): Knowledge of disease and therapeutic regimen to maintain health will improve  Short Term Goals: Ability to remain free from injury will improve, Ability to participate in decision making will improve, Ability to verbalize feelings will improve, Ability to disclose and discuss suicidal ideas, and Ability to identify and develop effective coping behaviors will improve  Medication Management: RN will administer medications as ordered by provider, will assess and evaluate patient's response and provide education to patient for prescribed medication. RN will report any adverse and/or side effects to prescribing provider.  Therapeutic Interventions: 1 on 1 counseling sessions, Psychoeducation, Medication administration, Evaluate responses to treatment, Monitor vital signs and CBGs as ordered, Perform/monitor CIWA, COWS, AIMS and Fall Risk screenings as ordered, Perform wound care treatments as ordered.  Evaluation of Outcomes: Progressing   LCSW Treatment Plan for Primary Diagnosis: Alcohol-induced mood disorder with depressive symptoms ([place]) Long Term Goal(s): Safe transition to appropriate next level of care at discharge, Engage patient in therapeutic group addressing interpersonal concerns.  Short Term Goals: Engage patient in aftercare planning  with referrals and resources, Increase social support, Increase emotional regulation, Facilitate acceptance of mental health diagnosis and concerns, Identify triggers associated with mental health/substance abuse issues, and Increase skills for wellness and recovery  Therapeutic Interventions: Assess for all discharge needs, 1 to 1 time with Social worker, Explore available resources and support systems, Assess for adequacy in community support network, Educate family and significant other(s) on suicide prevention, Complete Psychosocial Assessment, Interpersonal group therapy.  Evaluation of Outcomes:  Progressing   Progress in Treatment: Attending groups: Yes. Participating in groups: Yes. Taking medication as prescribed: Yes. Toleration medication: Yes. Family/Significant other contact made: Yes, individual(s) contacted:  Sister  Patient understands diagnosis: No. Discussing patient identified problems/goals with staff: Yes. Medical problems stabilized or resolved: Yes. Denies suicidal/homicidal ideation: Yes. Issues/concerns per patient self-inventory: No.   New problem(s) identified: No, Describe:  None   New Short Term/Long Term Goal(s): medication stabilization, elimination of SI thoughts, development of comprehensive mental wellness plan.   Patient Goals:  Did not attend   Discharge Plan or Barriers: Patient recently admitted. CSW will continue to follow and assess for appropriate referrals and possible discharge planning.   Reason for Continuation of Hospitalization: Anxiety Depression Medication stabilization Suicidal ideation  Estimated Length of Stay: 3 to 5 days    Scribe for Treatment Team: Darleen Crocker, Latanya Presser 10/12/2020 3:09 PM

## 2020-10-12 NOTE — Progress Notes (Signed)
Patient affect is depressed, flat and anxious.  Behavior is withdrawn and isolative.  Slept most of the day.  Denies AVH, SI or HI.   Patient reports sleeping fair with scheduled Trazodone. CIWA score is 3 - 5 scoring on anxiety.  Vistaril given once this shift with relief reported. Denies any other ETOH withdrawal symptoms. Emotional support and encouragement provided.  Denies pain.  EKG done - SB- 42 BPM.  Patient asymptomatic. MD made aware.  Patient compliant with all scheduled meds. Observed appropriate interaction with staff.  Reviewed POC with patient. Questions answered and understanding verbalized. Ongoing Q15 minute safety check rounds per unit protocol.   10/12/20 0800  Psych Admission Type (Psych Patients Only)  Admission Status Voluntary  Psychosocial Assessment  Patient Complaints Anxiety  Eye Contact Brief  Facial Expression Anxious  Affect Depressed;Anxious  Speech Logical/coherent  Interaction Minimal;Forwards little  Motor Activity Slow  Appearance/Hygiene Unremarkable  Behavior Characteristics Cooperative;Anxious  Mood Anxious;Depressed  Thought Process  Coherency WDL  Content WDL  Delusions None reported or observed  Perception WDL  Hallucination None reported or observed  Judgment Impaired  Confusion None  Danger to Self  Current suicidal ideation? Denies  Danger to Others  Danger to Others None reported or observed

## 2020-10-12 NOTE — Group Note (Signed)
Recreation Therapy Group Note   Group Topic:Stress Management  Group Date: 10/12/2020 Start Time: 0930 End Time: 0945 Facilitators: Victorino Sparrow, LRT/CTRS Location: [address] Dayroom  Goal Area(s) Addresses:  Patient will actively participate in stress management techniques presented during session.  Patient will successfully identify benefit of practicing stress management post d/c.   Group Description: Progressive Muscle Relaxation. LRT provided education, instruction and demonstration on practice of Progressive Muscle Relaxation. Patient was asked to participate in technique introduced during session. Patient was to tense each muscle then release the tension. Patient was asked to pay attention to their body and if they felt any discomfort, to skip those areas during the session.  LRT informed pts about resources to access pre-recorded scripts for PMR post d/c via Youtube and other apps or via internet with a smartphone, tablet, and/or computer.   Affect/Mood: N/A   Participation Level: Did not attend    Clinical Observations/Individualized Feedback: Pt did not attend group session.    Plan: Continue to engage patient in RT group sessions 2-3x/week.   Victorino Sparrow, LRT/CTRS 10/12/2020 11:51 AM

## 2020-10-12 NOTE — [facility] Group Notes (Signed)
[place] Group Notes:  (Nursing/MHT/Case Management/Adjunct)  Date:  10/12/2020  Time:  10:10 AM  Type of Therapy:  Group Therapy  Participation Level:  Active  Participation Quality:  Appropriate  Affect:  Appropriate  Cognitive:  Appropriate  Insight:  Appropriate  Engagement in Group:  Engaged  Modes of Intervention:  Discussion  Summary of Progress/Problems:Staff had a discussion with pt about orientation of unit and goals.  Garvin Fila 10/12/2020, 10:10 AM

## 2020-10-12 NOTE — Progress Notes (Addendum)
   10/12/20 2120  Psych Admission Type (Psych Patients Only)  Admission Status Voluntary  Psychosocial Assessment  Patient Complaints Anxiety;Depression;Sleep disturbance  Eye Contact Brief  Facial Expression Anxious  Affect Depressed;Anxious  Speech Logical/coherent  Interaction Assertive  Motor Activity Slow  Appearance/Hygiene Unremarkable  Behavior Characteristics Cooperative;Anxious  Mood Depressed;Anxious  Thought Process  Coherency WDL  Content WDL  Delusions None reported or observed  Perception WDL  Hallucination None reported or observed  Judgment WDL  Confusion None  Danger to Self  Current suicidal ideation? Denies  Danger to Others  Danger to Others None reported or observed   Pt seen in the milieu. Pt denies SI, HI, AVH and pain. Pt rates anxiety 7/10 and depression 6/10. Pt still hasn't spoken to his sister yet about his plans to pursue a therapist and psychiatrist. Pt has depressed affect. Pt denies any withdrawal symptoms. Pt VS show that he is bradycardic but is asymptomatic. Pt being started on Carafate this evening. Pt says he doesn't know why. Denies GERD, upset stomach and other digestive issues. Endorses past incidents of stabbing pain in his chest that eventually subsided that could be related to anxiety. Denies radiating pain to left arm or any cardiac symptoms.

## 2020-10-12 NOTE — Progress Notes (Addendum)
She has [facility] MD Progress Note  10/12/2020 7:36 AM Brandon Miller  MRN:  [id number]  Chief Complaint: SI and alcohol abuse  Reason for admission: Brandon Miller is a 55 year old male with a psychiatric history of alcohol use disorder- severe/dependent and depression, and a medical history of diabetes mellitus, who initially presented to [facility] ED for evaluation of suicidal ideation with a plan to shoot himself if he goes home.The patient is currently on Hospital Day 5.   Chart Review from last 24 hours: The patient's chart was reviewed and nursing notes were reviewed. The patient's case was discussed in multidisciplinary team meeting.  He had no acute safety or behavioral concerns noted. Per [facility] he was compliant with his medications.  He received as needed hydroxyzine.  Patient most recent CIWA was 5.  Patient continue to attend some of the groups.  Information Obtained Today During Patient Interview:  The patient was seen and evaluated on the unit. On assessment today the patient reports that he is feeling 'good "but overall still feeling depressed and anxious.   He states that he has a lot of anxiety.  He rates it 7/10 (10 is the worst anxiety). He still has some tremors.  He states he did not sleep well last night because of the other patient snoring.  He feels sleepy today.  He is interested in outpatient program he denies SI, HI, AVH, paranoia, or delusions.  He does not report alcohol craving.  He has been tolerating his medication without any side effects.  He denies other signs of withdrawal at this time except mild tremors and anxiety.  He has been eating well.  He denies medication side-effects.  Discussed that the social worker is going to talk to his sister about disposition.  Patient agrees with the plan.  Patient reports off-and-on stabbing chest pain denies it currently.  He states that he has been having that since 1 month.  He denies any other associated symptoms such as nausea, vomiting,  shortness of breath, dizziness.  He denies any burning sensation in the chest.  Principal Problem: Alcohol-induced mood disorder with depressive symptoms (HCC) Diagnosis: Principal Problem:   Alcohol-induced mood disorder with depressive symptoms (HCC) Active Problems:   Diabetes mellitus, type 2 (HCC)   Elevated LFTs   Alcohol dependence (HCC)   GERD (gastroesophageal reflux disease)   History of seizures   Cognitive complaints  Total Time spent with patient: I personally spent 25 minutes on the unit in direct patient care. The direct patient care time included face-to-face time with the patient, reviewing the patient's chart, communicating with other professionals, and coordinating care. Greater than 50% of this time was spent in counseling or coordinating care with the patient regarding goals of hospitalization, psycho-education, and discharge planning needs.   Past Psychiatric History: Alcohol dependence, elevated LFTs, alcohol induced mood disorder. He has been complaint with his medication. He has been taking Prozac 20 mg daily.  He states he has tried Dentist  Past Medical History:  Past Medical History:  Diagnosis Date   Allergy    Anemia    Anxiety    Arthritis    RA   Complication of anesthesia    hard to wake up-was told he may have sleep apnea-never tested   Depression    Diabetes mellitus without complication ([place])    with weight no more problems   Seizure ([place])    had one seizure of unknown etiology a few tears ago, none since  and on no meds for them   Sleep apnea    Snores     Past Surgical History:  Procedure Laterality Date   BIOPSY  02/12/2019   Procedure: BIOPSY;  Surgeon: Daneil Dolin, MD;  [facility];  Service: Endoscopy;;  stomach   COLONOSCOPY WITH PROPOFOL N/A 02/12/2019   Procedure: COLONOSCOPY WITH PROPOFOL;  Surgeon: Daneil Dolin, MD;  [facility];  Service: Endoscopy;  Laterality: N/A;  10:30am   ELBOW  SURGERY Right 2010   Dr. Percell Miller   ESOPHAGOGASTRODUODENOSCOPY (EGD) WITH PROPOFOL N/A 02/12/2019   Procedure: ESOPHAGOGASTRODUODENOSCOPY (EGD) WITH PROPOFOL;  Surgeon: Daneil Dolin, MD;  [facility];  Service: Endoscopy;  Laterality: N/A;   HERNIA REPAIR     Right inguinal   KNEE ARTHROSCOPY WITH MEDIAL MENISECTOMY Right 06/11/2013   Procedure: RIGHT KNEE ARTHROSCOPY WITH MEDIAL MENISECTOMY AND CHONDROPLASTY;  Surgeon: Ninetta Lights, MD;  Location: [place];  Service: Orthopedics;  Laterality: Right;  Chondroplasty, loose bodies, medial menisectomy, lysis of adhesions.   KNEE SURGERY Right 2011   POLYPECTOMY  02/12/2019   Procedure: POLYPECTOMY;  Surgeon: Daneil Dolin, MD;  [facility];  Service: Endoscopy;;   right ankle     SHOULDER SURGERY Right 2010   Dr. Percell Miller   TOTAL KNEE ARTHROPLASTY Right 08/25/2014   Procedure: TOTAL KNEE ARTHROPLASTY;  Surgeon: Ninetta Lights, MD;  Location: [place];  Service: Orthopedics;  Laterality: Right;   Family History:  Family History  Problem Relation Age of Onset   Hepatitis Mother    Cirrhosis Mother        Did not drink   Pneumonia Father    Kidney failure Father    Gastric cancer Brother        ? Gastric CA spread to esophagus   Colon cancer Neg Hx    Family Psychiatric  History:see H&P  Social History:  Social History   Substance and Sexual Activity  Alcohol Use Yes   Comment: 10-20/day cans beer     Social History   Substance and Sexual Activity  Drug Use No    Social History   Socioeconomic History   Marital status: Single    Spouse name: Not on file   Number of children: Not on file   Years of education: Not on file   Highest education level: Not on file  Occupational History   Occupation: unemployed  Tobacco Use   Smoking status: Every Day    Packs/day: 1.50    Years: 25.00    Pack years: 37.50    Types: Cigarettes   Smokeless tobacco: Never  Vaping Use   Vaping Use:  Never used  Substance and Sexual Activity   Alcohol use: Yes    Comment: 10-20/day cans beer   Drug use: No   Sexual activity: Yes  Other Topics Concern   Not on file  Social History Narrative   Not on file   Social Determinants of Health   Financial Resource Strain: Not on file  Food Insecurity: Not on file  Transportation Needs: Not on file  Physical Activity: Not on file  Stress: Not on file  Social Connections: Not on file   Additional Social History:   Sleep: good  Appetite:  Good  Current Medications: Current Facility-Administered Medications  Medication Dose Route Frequency Provider Last Rate Last Admin   alum & mag hydroxide-simeth (MAALOX/MYLANTA) 200-200-20 MG/5ML suspension 30 mL  30 mL Oral  Q4H PRN Prescilla Sours, PA-C       B-complex with vitamin C tablet 1 tablet  1 tablet Oral Daily Mone Commisso, Jackie Plum, MD       FLUoxetine (PROZAC) capsule 30 mg  30 mg Oral Daily Armando Reichert, MD   30 mg at 10/11/20 0900   gabapentin (NEURONTIN) capsule 300 mg  300 mg Oral TID Harlow Asa, MD   300 mg at 10/11/20 1707   hydrOXYzine (ATARAX/VISTARIL) tablet 25 mg  25 mg Oral Q6H PRN Harlow Asa, MD   25 mg at 10/11/20 2202   loperamide (IMODIUM) capsule 2-4 mg  2-4 mg Oral PRN Harlow Asa, MD       magnesium hydroxide (MILK OF MAGNESIA) suspension 30 mL  30 mL Oral Daily PRN Margorie John W, PA-C       multivitamin with minerals tablet 1 tablet  1 tablet Oral Daily Margorie John W, PA-C   1 tablet at 10/11/20 0900   nicotine (NICODERM CQ - dosed in mg/24 hr) patch 7 mg  7 mg Transdermal Daily Nelda Marseille, Amy E, MD   7 mg at 10/11/20 0900   omega-3 acid ethyl esters (LOVAZA) capsule 1 g  1 g Oral BID Maida Sale, MD   1 g at 10/11/20 1922   ondansetron (ZOFRAN-ODT) disintegrating tablet 4 mg  4 mg Oral Q6H PRN Harlow Asa, MD       pantoprazole (PROTONIX) EC tablet 40 mg  40 mg Oral Daily Singleton, Amy E, MD   40 mg at 10/11/20 0900   traZODone  (DESYREL) tablet 100 mg  100 mg Oral QHS Margorie John W, PA-C   100 mg at 10/11/20 2202    Lab Results:  Results for orders placed or performed during the hospital encounter of 10/07/20 (from the past 48 hour(s))  ANA w/Reflex if Positive     Status: Abnormal   Collection Time: 10/10/20  6:24 PM  Result Value Ref Range   Anti Nuclear Antibody (ANA) Positive (A) Negative    Comment: (NOTE) Performed At: [facility] [place], [place] [id number] Rush Farmer MD OV:[id number]   Magnesium     Status: None   Collection Time: 10/10/20  6:24 PM  Result Value Ref Range   Magnesium 2.2 1.7 - 2.4 mg/dL    Comment: Performed at [facility], [place] [address] [place], [place] [address]  Basic metabolic panel     Status: Abnormal   Collection Time: 10/10/20  6:24 PM  Result Value Ref Range   Sodium 140 135 - 145 mmol/L   Potassium 4.2 3.5 - 5.1 mmol/L   Chloride 99 98 - 111 mmol/L   CO2 31 22 - 32 mmol/L   Glucose, Bld 106 (H) 70 - 99 mg/dL    Comment: Glucose reference range applies only to samples taken after fasting for at least 8 hours.   BUN 14 6 - 20 mg/dL   Creatinine, Ser 1.33 (H) 0.61 - 1.24 mg/dL   Calcium 9.8 8.9 - 10.3 mg/dL   GFR, Estimated >60 >60 mL/min    Comment: (NOTE) Calculated using the CKD-EPI Creatinine Equation (2021)    Anion gap 10 5 - 15    Comment: Performed at [facility], [place] [address], [place], [place] [address]  ENA+DNA/DS+ANTICH+CENTRO+JO...     Status: Abnormal   Collection Time: 10/10/20  6:24 PM  Result Value Ref Range   ds DNA Ab <1 0 -  9 IU/mL    Comment: (NOTE)                                   Negative      <5                                   Equivocal  5 - 9                                   Positive      >9    Ribonucleic Protein 6.8 (H) 0.0 - 0.9 AI   ENA SM Ab Ser-aCnc <0.2 0.0 - 0.9 AI   Scleroderma (Scl-70) (ENA) Antibody, IgG <0.2 0.0 - 0.9 AI   SSA (Ro) (ENA) Antibody,  IgG <0.2 0.0 - 0.9 AI   SSB (La) (ENA) Antibody, IgG <0.2 0.0 - 0.9 AI   Chromatin Ab SerPl-aCnc <0.2 0.0 - 0.9 AI   Anti JO-1 <0.2 0.0 - 0.9 AI   Centromere Ab Screen <0.2 0.0 - 0.9 AI   See below: Comment     Comment: (NOTE) Autoantibody                       Disease Association ------------------------------------------------------------                        Condition                  Frequency ---------------------   ------------------------   --------- Antinuclear Antibody,    SLE, mixed connective Direct (ANA-D)           tissue diseases ---------------------   ------------------------   --------- dsDNA                    SLE                        40 - 60% ---------------------   ------------------------   --------- Chromatin                Drug induced SLE                90%                         SLE                        48 - 97% ---------------------   ------------------------   --------- SSA (Ro)                 SLE                        25 - 35%                         Sjogren's Syndrome         40 - 70%                         Neonatal Lupus  100% ---------------------   ------------------------   --------- SSB (La)                 SLE                              10%                         Sjogren's Syndrome              30% ---------------------   -----------------------    --------- Sm (anti-Smith)          SLE                        15 - 30% ---------------------   -----------------------    --------- RNP                      Mixed Connective Tissue                         Disease                         95% (U1 nRNP,                SLE                        30 - 50% anti-ribonucleoprotein)  Polymyositis and/or                         Dermatomyositis                 20% ---------------------   ------------------------   --------- Scl-70 (antiDNA          Scleroderma (diffuse)      20 - 35% topoisomerase)           Crest                            13% ---------------------   ------------------------   --------- Jo-1                     Polymyositis and/or                         Dermatomyositis            20 - 40% ---------------------   ------------------------   --------- Centromere B             Scleroderma -  Crest                         variant                         80% Performed At: [facility] [place], [place] [id number] Rush Farmer MD AS:[id number]     Blood Alcohol level:  Lab Results  Component Value Date   ETH 270 (H) 10/06/2020   ETH 188 (H) 98/92/1194    Metabolic Disorder Labs: Lab Results  Component Value Date   HGBA1C 4.3 (L) 10/08/2020   MPG 76.71 10/08/2020   MPG 85.32 05/19/2020   No  results found for: PROLACTIN Lab Results  Component Value Date   CHOL 122 10/08/2020   TRIG 62 10/08/2020   HDL 61 10/08/2020   CHOLHDL 2.0 10/08/2020   VLDL 12 10/08/2020   LDLCALC 49 10/08/2020    Physical Findings: AIMS: Facial and Oral Movements Muscles of Facial Expression: None, normal Lips and Perioral Area: None, normal Jaw: None, normal Tongue: None, normal,Extremity Movements Upper (arms, wrists, hands, fingers): None, normal Lower (legs, knees, ankles, toes): None, normal, Trunk Movements Neck, shoulders, hips: None, normal, Overall Severity Severity of abnormal movements (highest score from questions above): None, normal Incapacitation due to abnormal movements: None, normal Patient's awareness of abnormal movements (rate only patient's report): No Awareness, Dental Status Current problems with teeth and/or dentures?: Yes Does patient usually wear dentures?: No  CIWA:  CIWA-Ar Total: 5   Musculoskeletal: Strength & Muscle Tone: normal Gait & Station:  normal Patient leans: N/A  Psychiatric Specialty Exam:  Presentation  General Appearance: Appropriate for environment.  Eye Contact:Fair  Speech:Clear and Coherent  Speech Volume:  Normal  Handedness:Right   Mood and Affect  Mood:dysphoric   Affect:Constricted   Thought Process  Thought Processes:Coherent; Linear  Descriptions of Associations:Intact  Orientation:Full (Time, Place and Person)  Thought Content:Logical  - no evidence of delusions, paranoia or psychosis on exam  History of Schizophrenia/Schizoaffective disorder:No data recorded Duration of Psychotic Symptoms:No data recorded Hallucinations:Hallucinations: None  Ideas of Reference:None  Suicidal Thoughts:Suicidal Thoughts: No  Homicidal Thoughts:Homicidal Thoughts: No   Sensorium  Memory:Immediate Fair; Recent Fair; Remote Fair  Judgment:Improving   Insight:Fair   Executive Functions  Concentration:Good  Attention Span:Good  [place]  Language:Good   Psychomotor Activity  Psychomotor Activity:Psychomotor Activity: Normal   Assets  Assets:Communication Skills; Desire for Improvement; Financial Resources/Insurance; Resilience   Sleep  Sleep:Sleep: Good Number of Hours of Sleep: 5.5    Physical Exam: Physical Exam Vitals and nursing note reviewed.  Constitutional:      General: He is not in acute distress.    Appearance: Normal appearance.  HENT:     Head: Normocephalic and atraumatic.     Mouth/Throat:     Comments: Poor dentition Pulmonary:     Effort: Pulmonary effort is normal.  Neurological:     General: No focal deficit present.     Mental Status: He is alert.   Review of Systems  Constitutional:  Negative for chills and diaphoresis.  Respiratory:  Negative for shortness of breath.   Cardiovascular:  Negative for chest pain.       Off-and-on chest pain but denies it right now  Gastrointestinal:  Negative for abdominal pain, constipation, diarrhea, heartburn, nausea and vomiting.  Genitourinary: Negative.   Neurological:  Negative for dizziness, tremors and headaches.  Psychiatric/Behavioral:  Positive for  depression. Negative for hallucinations and suicidal ideas. The patient is nervous/anxious.   Blood pressure 91/62, pulse 60, temperature 97.7 F (36.5 C), temperature source Oral, resp. rate 18, height 6\' 3"  (1.905 m), weight 90.7 kg, SpO2 99 %. Body mass index is 25 kg/m.   Treatment Plan Summary: Daily contact with patient to assess and evaluate symptoms and progress in treatment  Alcohol induced depressive d/o (r/o MDD) -stopped Prozac  - start Remeron 15mg  PO daily -monitor vegetative symptoms.    Alcohol use d/o - severe dependence  Last CIWA 5 (anxiety) -Patient completed Ativan taper. -Continue B vitamin replacement, multivitamin -Continue gabapentin 300 mg 3 times daily for alcohol craving, anxiety, and potential seizures -  SW to assist with referrals to [facility] and patient counseled on the need to abstain from alcohol use.  Patient declines residential rehab referral. - Continue Protonix 40mg  daily for GI protection   Transaminitis - 9/19 - AST 105 and ALT 67 (trending down) - PT/INR 12.3/0.9, PTT 28 -Hep A, hep C, hep B nonreactive on 4/28 - will need outpatient f/u after discharge -We will repeat LFTs today.  H/o high TSH On 4/28 -TSH 5.479, T4 low at 0.59, T3 uptake ratio high at 45. On 9/17 TSH 4.046 - patient currently off Synthroid and advised to f/u with PCP for monitoring after discharge  Thrombocytopenia Platelets 100 on 9/18 and on 9/17 -ANA positive for ribonucleoprotein 6.8.  Will recommend to follow-up with PCP at discharge. - Appears chronic; will need outpatient f/u after discharge   H/o withdrawal seizures Patient had withdrawal seizure 2 years ago, no other seizures outside of alcohol use.  Patient has not taken Keppra since June - Continue Neurontin 300mg  tid  - Seizure precautions    Nicotine dependence -Nicotine Patch 7 mg daily  Asymptomatic bradycardia -Pulse rate chronically low.  Most recent 60/min -medicine was consulted.  Recommended  to call cardiology if pulse rate goes in the 30s or patient complains of any symptoms. -Repeat EKG shows marked sinus bradycardia with pulse rate 42/min.   Off-and-on stabbing chest pain -.Repeat EKG shows marked sinus bradycardia with pulse rate 42/min.  No ST wave changes.  Pulse rate chronically low. -Monitor vitals and symptoms. -Add Carafate 1 g oral 3 times daily. -Continue Protonix 40 mg daily.   PRN's -Continue Milk of Magnesia 30 ml PRN Daily for Constipation. -Continue Maalox/Mylanta 30 ml Q4H PRN for Indigestion. -Continue hydroxyzine 25 mg TID PRN for Anxiety. -Continue trazodone 50 mg QHS PRN for sleep.  Armando Reichert, MD, PGY 2 10/12/2020, 7:36 AM

## 2020-10-12 NOTE — Group Note (Signed)
LCSW Group Therapy Note   Group Date: 10/12/2020 Start Time: 1300 End Time: 81  [place] LCSW Group Therapy   1:15 PM Type of Therapy: Group Therapy Type of Therapy:  Group Therapy  Participation Level:  Active  Modes of Intervention: Clarification, Confrontation, Discussion, Education, Exploration, Limit-setting, Orientation, Problem-solving, Rapport Building, Art therapist, Socialization and Support  Summary of Progress/Problems: The topic for group today was emotional regulation. This group focused on both positive and negative emotion identification and allowed group members to process ways to identify feelings, regulate negative emotions, and find healthy ways to manage internal/external emotions. Group members were asked to reflect on a time when their reaction to an emotion led to a negative outcome and explored how alternative responses using emotion regulation would have benefited them. Group members were also asked to discuss a time when emotion regulation was utilized when a negative emotion was experienced.      Mliss Fritz, LCSWA 10/12/2020  1:53 PM

## 2020-10-12 NOTE — Group Note (Deleted)
LCSW Group Therapy Note   Group Date: 10/12/2020 Start Time: 1300 End Time: 1345   Type of Therapy and Topic:  Group Therapy:   Participation Level:  {BHH PARTICIPATION [id number]}  Description of Group:   Therapeutic Goals:  1.     Summary of Patient Progress:    ***  Therapeutic Modalities:   Brandon Miller 10/12/2020  1:48 PM

## 2020-10-12 NOTE — BHH Group Notes (Signed)
[facility] Group Notes:  (Nursing/MHT/Case Management/Adjunct)  Date:  10/12/2020  Time:  4:56 PM  Type of Therapy:  Group Therapy  Participation Level:  Active  Participation Quality:  Appropriate  Affect:  Appropriate  Cognitive:  Appropriate  Insight:  Appropriate  Engagement in Group:  Engaged  Modes of Intervention:  Discussion  Summary of Progress/Problems:Pt was given "Personal Acceptance" worksheet with key points to remember and discuss.  Garvin Fila 10/12/2020, 4:56 PM

## 2020-10-12 NOTE — BHH Counselor (Signed)
CSW attempted to contact Mrs. Cockerham but received no answer.  CSW left a voicemail asking Mrs. Nickola Major to return her call.

## 2020-10-13 MED ORDER — HYDROXYZINE HCL 25 MG PO TABS
25.0000 mg | ORAL_TABLET | Freq: Four times a day (QID) | ORAL | Status: AC | PRN
  Administered 2020-10-13 – 2020-10-15 (×4): 25 mg via ORAL
  Filled 2020-10-13 (×4): qty 1

## 2020-10-13 MED ORDER — MIRTAZAPINE 7.5 MG PO TABS
22.5000 mg | ORAL_TABLET | Freq: Every day | ORAL | Status: DC
  Administered 2020-10-13 – 2020-10-14 (×2): 22.5 mg via ORAL
  Filled 2020-10-13 (×5): qty 1

## 2020-10-13 MED ORDER — GABAPENTIN 400 MG PO CAPS
400.0000 mg | ORAL_CAPSULE | Freq: Three times a day (TID) | ORAL | Status: DC
  Administered 2020-10-13 – 2020-10-17 (×12): 400 mg via ORAL
  Filled 2020-10-13 (×19): qty 1

## 2020-10-13 NOTE — BHH Group Notes (Signed)
Pt did attend the MHT therapeutic relaxation group.

## 2020-10-13 NOTE — BHH Group Notes (Signed)
Adult Psychoeducational Group Note  Date:  10/13/2020 Time:  11:15 PM  Group Topic/Focus:  Coping With Mental Health Crisis:   The purpose of this group is to help patients identify strategies for coping with mental health crisis.  Group discusses possible causes of crisis and ways to manage them effectively.  Participation Level:  Active  Participation Quality:  Appropriate  Affect:  Appropriate  Cognitive:  Alert  Insight: Good  Engagement in Group:  Engaged  Modes of Intervention:  Discussion  Additional Comments  Dalene Carrow 10/13/2020, 11:15 PM

## 2020-10-13 NOTE — BHH Group Notes (Signed)
Adult Psychoeducational Group Note  Date:  10/13/2020 Time:  4:54 PM  Group Topic/Focus:  Goals Group:   The focus of this group is to help patients establish daily goals to achieve during treatment and discuss how the patient can incorporate goal setting into their daily lives to aide in recovery.  Participation Level:  Minimal  Participation Quality:  Attentive  Affect:  Appropriate  Cognitive:  Appropriate  Insight: Appropriate  Engagement in Group:  Engaged  Modes of Intervention:  Discussion  Additional Comments:    Dub Mikes 10/13/2020, 4:54 PM

## 2020-10-13 NOTE — BHH Group Notes (Signed)
Pt did not attend goals group. 

## 2020-10-13 NOTE — Progress Notes (Addendum)
[facility] MD Progress Note  10/13/2020 1:27 PM Brandon Miller  MRN:  [id number]  Chief Complaint: SI and alcohol abuse  Reason for admission: Seabron is a 55 year old male with a psychiatric history of alcohol use disorder- severe/dependent and depression, and a medical history of diabetes mellitus, who initially presented to [facility] for evaluation of suicidal ideation with a plan to shoot himself if he goes home.The patient is currently on Hospital Day 6.   Chart Review from last 24 hours: The patient's chart was reviewed and nursing notes were reviewed. The patient's case was discussed in multidisciplinary team meeting.  He had no acute safety or behavioral concerns noted. Per [facility] he was compliant with his medications.  He received as needed hydroxyzine.  Patient most recent CIWA was 3.  Patient continue to attend some of the groups.  Information Obtained Today During Patient Interview:  The patient was seen and evaluated on the unit. On assessment today the patient reports that he is feeling 'ok "but overall feeling depressed and anxious.   He states that he has some anxiety.  He still reports some tremors.  He states he slept well last night.  He states that his sister is interested in sending him to inpatient rehab but he is not sure and more interested in outpatient program.  He denies SI, HI, AVH, paranoia, or delusions.  He does not report alcohol craving.  He has been tolerating his medication without any side effects.  He has been eating well.  He denies medication side-effects.  He states that he can go to his sister's house at discharge.  Discussed that the social worker is going to talk to his sister about disposition.  Patient denies any chest pain or any new symptoms today.  Discussed that he can follow-up with his PCP.  He agrees with the plan.  He denies any other associated symptoms such as nausea, headache, vomiting, shortness of breath, dizziness, diarrhea, or constipation.  He  denies any burning sensation in the chest.  Principal Problem: Alcohol-induced mood disorder with depressive symptoms (HCC) Diagnosis: Principal Problem:   Alcohol-induced mood disorder with depressive symptoms (HCC) Active Problems:   Diabetes mellitus, type 2 (HCC)   Elevated LFTs   Alcohol dependence (HCC)   GERD (gastroesophageal reflux disease)   History of seizures   Cognitive complaints  Total Time spent with patient: I personally spent 25 minutes on the unit in direct patient care. The direct patient care time included face-to-face time with the patient, reviewing the patient's chart, communicating with other professionals, and coordinating care. Greater than 50% of this time was spent in counseling or coordinating care with the patient regarding goals of hospitalization, psycho-education, and discharge planning needs.   Past Psychiatric History: Alcohol dependence, elevated LFTs, alcohol induced mood disorder. He has been complaint with his medication. He has been taking Prozac 20 mg daily.  He states he has tried Dentist  Past Medical History:  Past Medical History:  Diagnosis Date   Allergy    Anemia    Anxiety    Arthritis    RA   Complication of anesthesia    hard to wake up-was told he may have sleep apnea-never tested   Depression    Diabetes mellitus without complication ([facility])    with weight no more problems   Seizure ([facility])    had one seizure of unknown etiology a few tears ago, none since and on no meds for them  Sleep apnea    Snores     Past Surgical History:  Procedure Laterality Date   BIOPSY  02/12/2019   Procedure: BIOPSY;  Surgeon: Daneil Dolin, MD;  [facility];  Service: Endoscopy;;  stomach   COLONOSCOPY WITH PROPOFOL N/A 02/12/2019   Procedure: COLONOSCOPY WITH PROPOFOL;  Surgeon: Daneil Dolin, MD;  [facility];  Service: Endoscopy;  Laterality: N/A;  10:30am   ELBOW SURGERY Right 2010   Dr. Percell Miller    ESOPHAGOGASTRODUODENOSCOPY (EGD) WITH PROPOFOL N/A 02/12/2019   Procedure: ESOPHAGOGASTRODUODENOSCOPY (EGD) WITH PROPOFOL;  Surgeon: Daneil Dolin, MD;  [facility];  Service: Endoscopy;  Laterality: N/A;   HERNIA REPAIR     Right inguinal   KNEE ARTHROSCOPY WITH MEDIAL MENISECTOMY Right 06/11/2013   Procedure: RIGHT KNEE ARTHROSCOPY WITH MEDIAL MENISECTOMY AND CHONDROPLASTY;  Surgeon: Ninetta Lights, MD;  Location: [place];  Service: Orthopedics;  Laterality: Right;  Chondroplasty, loose bodies, medial menisectomy, lysis of adhesions.   KNEE SURGERY Right 2011   POLYPECTOMY  02/12/2019   Procedure: POLYPECTOMY;  Surgeon: Daneil Dolin, MD;  [facility];  Service: Endoscopy;;   right ankle     SHOULDER SURGERY Right 2010   Dr. Percell Miller   TOTAL KNEE ARTHROPLASTY Right 08/25/2014   Procedure: TOTAL KNEE ARTHROPLASTY;  Surgeon: Ninetta Lights, MD;  Location: [place];  Service: Orthopedics;  Laterality: Right;   Family History:  Family History  Problem Relation Age of Onset   Hepatitis Mother    Cirrhosis Mother        Did not drink   Pneumonia Father    Kidney failure Father    Gastric cancer Brother        ? Gastric CA spread to esophagus   Colon cancer Neg Hx    Family Psychiatric  History:see H&P  Social History:  Social History   Substance and Sexual Activity  Alcohol Use Yes   Comment: 10-20/day cans beer     Social History   Substance and Sexual Activity  Drug Use No    Social History   Socioeconomic History   Marital status: Single    Spouse name: Not on file   Number of children: Not on file   Years of education: Not on file   Highest education level: Not on file  Occupational History   Occupation: unemployed  Tobacco Use   Smoking status: Every Day    Packs/day: 1.50    Years: 25.00    Pack years: 37.50    Types: Cigarettes   Smokeless tobacco: Never  Vaping Use   Vaping Use: Never used  Substance and Sexual  Activity   Alcohol use: Yes    Comment: 10-20/day cans beer   Drug use: No   Sexual activity: Yes  Other Topics Concern   Not on file  Social History Narrative   Not on file   Social Determinants of Health   Financial Resource Strain: Not on file  Food Insecurity: Not on file  Transportation Needs: Not on file  Physical Activity: Not on file  Stress: Not on file  Social Connections: Not on file   Additional Social History:   Sleep: good  Appetite:  Good  Current Medications: Current Facility-Administered Medications  Medication Dose Route Frequency Provider Last Rate Last Admin   alum & mag hydroxide-simeth (MAALOX/MYLANTA) 200-200-20 MG/5ML suspension 30 mL  30 mL Oral Q4H PRN Prescilla Sours, PA-C  B-complex with vitamin C tablet 1 tablet  1 tablet Oral Daily Massiah Minjares, Jackie Plum, MD   1 tablet at 10/13/20 9811   gabapentin (NEURONTIN) capsule 300 mg  300 mg Oral TID Harlow Asa, MD   300 mg at 10/13/20 1138   hydrOXYzine (ATARAX/VISTARIL) tablet 25 mg  25 mg Oral Q6H PRN Harlow Asa, MD   25 mg at 10/12/20 2141   loperamide (IMODIUM) capsule 2-4 mg  2-4 mg Oral PRN Harlow Asa, MD       magnesium hydroxide (MILK OF MAGNESIA) suspension 30 mL  30 mL Oral Daily PRN Margorie John W, PA-C       mirtazapine (REMERON) tablet 15 mg  15 mg Oral QHS Silus Lanzo, Jackie Plum, MD   15 mg at 10/12/20 2142   multivitamin with minerals tablet 1 tablet  1 tablet Oral Daily Prescilla Sours, PA-C   1 tablet at 10/13/20 9147   nicotine (NICODERM CQ - dosed in mg/24 hr) patch 7 mg  7 mg Transdermal Daily Nelda Marseille, Amy E, MD   7 mg at 10/13/20 1139   omega-3 acid ethyl esters (LOVAZA) capsule 1 g  1 g Oral BID Maida Sale, MD   1 g at 10/13/20 0822   ondansetron (ZOFRAN-ODT) disintegrating tablet 4 mg  4 mg Oral Q6H PRN Harlow Asa, MD       pantoprazole (PROTONIX) EC tablet 40 mg  40 mg Oral Daily Nelda Marseille, Amy E, MD   40 mg at 10/13/20 8295   sucralfate  (CARAFATE) 1 GM/10ML suspension 1 g  1 g Oral TID WC & HS Armando Reichert, MD   1 g at 10/13/20 1138    Lab Results:  Results for orders placed or performed during the hospital encounter of 10/07/20 (from the past 48 hour(s))  Hepatic function panel     Status: Abnormal   Collection Time: 10/12/20  6:25 PM  Result Value Ref Range   Total Protein 7.4 6.5 - 8.1 g/dL   Albumin 4.2 3.5 - 5.0 g/dL   AST 85 (H) 15 - 41 U/L   ALT 74 (H) 0 - 44 U/L   Alkaline Phosphatase 133 (H) 38 - 126 U/L   Total Bilirubin 0.7 0.3 - 1.2 mg/dL   Bilirubin, Direct 0.1 0.0 - 0.2 mg/dL   Indirect Bilirubin 0.6 0.3 - 0.9 mg/dL    Comment: Performed at [facility], [place] [address], [place], [place] [address]    Blood Alcohol level:  Lab Results  Component Value Date   ETH 270 (H) 10/06/2020   ETH 188 (H) 86/57/8469    Metabolic Disorder Labs: Lab Results  Component Value Date   HGBA1C 4.3 (L) 10/08/2020   MPG 76.71 10/08/2020   MPG 85.32 05/19/2020   No results found for: PROLACTIN Lab Results  Component Value Date   CHOL 122 10/08/2020   TRIG 62 10/08/2020   HDL 61 10/08/2020   CHOLHDL 2.0 10/08/2020   VLDL 12 10/08/2020   LDLCALC 49 10/08/2020    Physical Findings: AIMS: Facial and Oral Movements Muscles of Facial Expression: None, normal Lips and Perioral Area: None, normal Jaw: None, normal Tongue: None, normal,Extremity Movements Upper (arms, wrists, hands, fingers): None, normal Lower (legs, knees, ankles, toes): None, normal, Trunk Movements Neck, shoulders, hips: None, normal, Overall Severity Severity of abnormal movements (highest score from questions above): None, normal Incapacitation due to abnormal movements: None, normal Patient's awareness of abnormal movements (rate only patient's report):  No Awareness, Dental Status Current problems with teeth and/or dentures?: Yes Does patient usually wear dentures?: No  CIWA:  CIWA-Ar Total:  3   Musculoskeletal: Strength & Muscle Tone: normal Gait & Station:  normal Patient leans: N/A  Psychiatric Specialty Exam:  Presentation  General Appearance: Appropriate for environment.  Eye Contact:Fair  Speech:Clear and Coherent  Speech Volume: Normal  Handedness:Right   Mood and Affect  Mood:dysphoric   Affect:Depressed   Thought Process  Thought Processes:Coherent  Descriptions of Associations:Intact  Orientation:Full (Time, Place and Person)  Thought Content:Logical  - no evidence of delusions, paranoia or psychosis on exam  History of Schizophrenia/Schizoaffective disorder:No data recorded Duration of Psychotic Symptoms:No data recorded Hallucinations:Hallucinations: None  Ideas of Reference:None  Suicidal Thoughts:Suicidal Thoughts: No  Homicidal Thoughts:Homicidal Thoughts: No   Sensorium  Memory:Immediate Fair; Recent Fair; Remote Fair  Judgment:Improving   Insight:Fair   Executive Functions  Concentration:Good  Attention Span:Good  [place]  Language:Good   Psychomotor Activity  Psychomotor Activity:Psychomotor Activity: Normal   Assets  Assets:Communication Skills; Desire for Improvement; Financial Resources/Insurance; Resilience   Sleep  Sleep:Sleep: Good Number of Hours of Sleep: 6.25    Physical Exam: Physical Exam Vitals and nursing note reviewed.  Constitutional:      General: He is not in acute distress.    Appearance: Normal appearance.  HENT:     Head: Normocephalic and atraumatic.     Mouth/Throat:     Comments: Poor dentition Pulmonary:     Effort: Pulmonary effort is normal.  Neurological:     General: No focal deficit present.     Mental Status: He is alert.   Review of Systems  Constitutional:  Negative for chills and diaphoresis.  Respiratory:  Negative for shortness of breath.   Cardiovascular:  Negative for chest pain.  Gastrointestinal:  Negative for  abdominal pain, constipation, diarrhea, heartburn, nausea and vomiting.  Genitourinary: Negative.   Neurological:  Negative for dizziness, tremors and headaches.  Psychiatric/Behavioral:  Positive for depression. Negative for hallucinations and suicidal ideas. The patient is nervous/anxious.   Blood pressure 104/64, pulse (!) 49, temperature 97.7 F (36.5 C), temperature source Oral, resp. rate 18, height 6\' 3"  (1.905 m), weight 90.7 kg, SpO2 100 %. Body mass index is 25 kg/m.   Treatment Plan Summary: Daily contact with patient to assess and evaluate symptoms and progress in treatment  Alcohol induced depressive d/o (r/o MDD) -Prozac was stopped yesterday and Remeron was started. -increase Remeron 22.5mg  PO daily -monitor vegetative symptoms.    Alcohol use d/o - severe dependence  Last CIWA 3 (anxiety) -Patient completed Ativan taper. -Continue B vitamin replacement, multivitamin -increase gabapentin 400 mg 3 times daily for alcohol craving, anxiety, and potential seizures - SW to assist with referrals to [facility] and patient counseled on the need to abstain from alcohol use.  Patient declines residential rehab referral. - Continue Protonix 40mg  daily for GI protection   Transaminitis - 9/21 - AST 85 and ALT 74 (trending down) -Alkaline phosphatase 133 - PT/INR 12.3/0.9, PTT 28 -Hep A, hep C, hep B nonreactive on 4/28 - will need outpatient f/u after discharge  H/o high TSH On 4/28 -TSH 5.479, T4 low at 0.59, T3 uptake ratio high at 45. On 9/17 TSH 4.046 - patient currently off Synthroid and advised to f/u with PCP for monitoring after discharge  Thrombocytopenia Platelets 100 on 9/18 and on 9/17 -ANA positive for ribonucleoprotein 6.8.  Will recommend to follow-up with PCP at discharge. -  Appears chronic; will need outpatient f/u after discharge   H/o withdrawal seizures Patient had withdrawal seizure 2 years ago, no other seizures outside of alcohol use.  Patient has not  taken Keppra since June - Continue Neurontin 300mg  tid. - Seizure precautions.      Nicotine dependence -Nicotine Patch 7 mg daily  Asymptomatic bradycardia -Pulse rate chronically low.  Most recent 60/min -medicine was consulted.  Recommended to call cardiology if pulse rate goes in the 30s or patient complains of any symptoms. -Repeat EKG shows marked sinus bradycardia with pulse rate 42/min.   Off-and-on stabbing chest pain -No chest pain  today -.Repeat EKG yesterday shows marked sinus bradycardia with pulse rate 42/min.  No ST wave changes.  Pulse rate chronically low. -Monitor vitals and symptoms. -Continue Carafate 1 g oral 3 times daily. -Continue Protonix 40 mg daily.   PRN's -Continue Milk of Magnesia 30 ml PRN Daily for Constipation. -Continue Maalox/Mylanta 30 ml Q4H PRN for Indigestion. -Continue hydroxyzine 25 mg TID PRN for Anxiety. -Continue trazodone 50 mg QHS PRN for sleep.  Armando Reichert, MD, PGY 2 10/13/2020, 1:27 PM

## 2020-10-13 NOTE — B[facility] Counselor (Signed)
CSW spoke with Mrs. Brandon Miller who states that her brother has lived with her before but left because "he does not like living with me".  Mrs. Brandon Miller states that her brother is always welcome at her home and also states that he can stay there when he is discharged from the hospital.  She states that she would like the doctor to wait until after Sunday for her brother to be discharged because she will have her young nephews at the home this weekend and believes that "they will get on his nerves while they are here".  Mrs. Brandon Miller states that it will make things easier for her if her brother lives with her instead of in his own home because then she can make sure he is taken care of and feels supported.  Mrs. Brandon Miller states that she is happy that the doctors took her brother off of the Prozac but would like to know why he is not taking his Adderall.  She would like the doctor to contact her to discuss his medications prior to his discharge.

## 2020-10-14 NOTE — Group Note (Signed)
LCSW Group Therapy Note   Group Date: 10/14/2020 Start Time: 1300 End Time: 1400    Type of Therapy and Topic:  Group Therapy:  Self-Care Wheel  Participation Level:  Active   Description of Group This process group involved patients discussing the importance of self-care in different areas of life (professional, personal, emotional, psychological, spiritual, and physical) in order to achieve healthy life balance.  The group talked about what self-care in each of those areas would constitute and then specifically listed how they want to provide themselves with improved self-care in this new year.      Therapeutic Goals Patient will learn how to break self-care down into various areas of life Patient will participate in generating ideas about healthy self-care options in each category Patients will be supportive of one another and receive support from others Patient will identify one healthy self-care activity to add to his/her life this year  Summary of Patient Progress:  Pt attended and participated in group. Pt was observed interacting with peers.  Therapeutic Modalities Processing Psychoeducation  Vassie Moselle, LCSW 10/14/2020  1:20 PM

## 2020-10-14 NOTE — [facility] Group Notes (Signed)
Spiritual care group on loss and grief facilitated by Chaplain Janne Napoleon, [facility]   Group goal: Support / education around grief.   Identifying grief patterns, feelings / responses to grief, identifying behaviors that may emerge from grief responses, identifying when one may call on an ally or coping skill.   Group Description:   Following introductions and group rules, group opened with psycho-social ed. Group members engaged in facilitated dialog around topic of loss, with particular support around experiences of loss in their lives. Group Identified types of loss (relationships / self / things) and identified patterns, circumstances, and changes that precipitate losses. Reflected on thoughts / feelings around loss, normalized grief responses, and recognized variety in grief experience.   Group engaged in visual explorer activity, identifying elements of grief journey as well as needs / ways of caring for themselves. Group reflected on Worden's tasks of grief.   Group facilitation drew on brief cognitive behavioral, narrative, and Adlerian modalities   Patient progress: Brandon Miller attended group and engaged in group conversation.  He shared about some of his own experiences and coping techniques.  [place], Bcc Pager, [phone] 10:39 PM

## 2020-10-14 NOTE — [facility] Group Notes (Signed)
Adult Psychoeducational Group Note  Date:  10/14/2020 Time:  5:19 PM  Group Topic/Focus:  Coping With Mental Health Crisis:   The purpose of this group is to help patients identify strategies for coping with mental health crisis.  Group discusses possible causes of crisis and ways to manage them effectively.  Participation Level:  Active  Participation Quality:  Appropriate  Affect:  Appropriate and Flat  Cognitive:  Alert  Insight: Appropriate and Good  Engagement in Group:  Engaged  Modes of Intervention:  Activity, Discussion, and Education  Additional Comments:  Patient attended group. Intervention used was "Now, Future, Wall". Discussion focused on emotions, feelings, thoughts, things that they are going through "Now" and things that they want in the "Future". Discussion and activity also included "Walls" or barriers that are in the way of them reaching their "Future". On the back of the paper, patient listed coping skills that would help them reach the "Future" and help break down the "Walls".   Brandon Miller E Brandon Miller 10/14/2020, 5:19 PM

## 2020-10-14 NOTE — Progress Notes (Addendum)
[facility] MD Progress Note  10/14/2020 12:34 PM Brandon Miller  MRN:  [id number]  Chief Complaint: SI and alcohol abuse  Reason for admission: Brandon Miller is a 55 year old male with a psychiatric history of alcohol use disorder- severe/dependent and depression, and a medical history of diabetes mellitus, who initially presented to [facility] for evaluation of suicidal ideation with a plan to shoot himself if he goes home.The patient is currently on Hospital Day 7.   Chart Review from last 24 hours: The patient's chart was reviewed and nursing notes were reviewed. The patient's case was discussed in multidisciplinary team meeting.  He had no acute safety or behavioral concerns noted. Per [facility] he was compliant with his medications except sucralfate  He received as needed hydroxyzine.  Patient most recent CIWA was 0.  Patient continue to attend some of the groups.  Per social worker patient can return back to sisters home but only after Sunday as younger nephews at home this weekend who can make him anxious.  Information Obtained Today During Patient Interview: The patient was seen and evaluated on the unit with Dr. Berdine Addison on assessment today the patient reports that he is feeling 'ok "but looks depressed and anxious.  He states that he still has anxiety. He states he slept well last night. He states that he talked to his Brandon Miller recently.  He denies SI, HI, AVH, paranoia, or delusions.  He is still remembering his past trauma and sometimes have intrusive thoughts. When asked about nightmares, he initially says "yes" but then states he does not want to say them nightmares its more like intrusive thoughts. He states he isolate sometimes.  He states after TBI, he did not go to any TBI rehab but went to a neurologist.   He has been tolerating his medication without any side effects.  He has been eating well.  He denies medication side-effects.  He states that he can go to his Brandon Miller at discharge.  Discussed  that the social worker is going to talk to his Brandon Miller about disposition.  Patient denies any chest pain or any new symptoms today.  He is also concerned that he may have lupus. Discussed that his ANA is positive and he can follow-up with his PCP who can order other tests for autoimmune diseases.  He agrees with the plan.  He denies any other associated symptoms such as nausea, headache, vomiting, shortness of breath, dizziness, diarrhea, or constipation.  He denies any burning sensation in the chest.  Principal Problem: Alcohol-induced mood disorder with depressive symptoms (HCC) Diagnosis: Principal Problem:   Alcohol-induced mood disorder with depressive symptoms (HCC) Active Problems:   Diabetes mellitus, type 2 (HCC)   Elevated LFTs   Alcohol dependence (HCC)   GERD (gastroesophageal reflux disease)   History of seizures   Cognitive complaints  Total Time spent with patient: I personally spent 25 minutes on the unit in direct patient care. The direct patient care time included face-to-face time with the patient, reviewing the patient's chart, communicating with other professionals, and coordinating care. Greater than 50% of this time was spent in counseling or coordinating care with the patient regarding goals of hospitalization, psycho-education, and discharge planning needs.   Past Psychiatric History: Alcohol dependence, elevated LFTs, alcohol induced mood disorder. He has been complaint with his medication. He has been taking Prozac 20 mg daily.  He states he has tried Dentist  Past Medical History:  Past Medical History:  Diagnosis Date  Allergy    Anemia    Anxiety    Arthritis    RA   Complication of anesthesia    hard to wake up-was told he may have sleep apnea-never tested   Depression    Diabetes mellitus without complication ([place])    with weight no more problems   Seizure ([place])    had one seizure of unknown etiology a few tears ago, none since and on no meds  for them   Sleep apnea    Snores     Past Surgical History:  Procedure Laterality Date   BIOPSY  02/12/2019   Procedure: BIOPSY;  Surgeon: Daneil Dolin, MD;  [facility];  Service: Endoscopy;;  stomach   COLONOSCOPY WITH PROPOFOL N/A 02/12/2019   Procedure: COLONOSCOPY WITH PROPOFOL;  Surgeon: Daneil Dolin, MD;  [facility];  Service: Endoscopy;  Laterality: N/A;  10:30am   ELBOW SURGERY Right 2010   Dr. Percell Miller   ESOPHAGOGASTRODUODENOSCOPY (EGD) WITH PROPOFOL N/A 02/12/2019   Procedure: ESOPHAGOGASTRODUODENOSCOPY (EGD) WITH PROPOFOL;  Surgeon: Daneil Dolin, MD;  [facility];  Service: Endoscopy;  Laterality: N/A;   HERNIA REPAIR     Right inguinal   KNEE ARTHROSCOPY WITH MEDIAL MENISECTOMY Right 06/11/2013   Procedure: RIGHT KNEE ARTHROSCOPY WITH MEDIAL MENISECTOMY AND CHONDROPLASTY;  Surgeon: Ninetta Lights, MD;  Location: [facility];  Service: Orthopedics;  Laterality: Right;  Chondroplasty, loose bodies, medial menisectomy, lysis of adhesions.   KNEE SURGERY Right 2011   POLYPECTOMY  02/12/2019   Procedure: POLYPECTOMY;  Surgeon: Daneil Dolin, MD;  [facility];  Service: Endoscopy;;   right ankle     SHOULDER SURGERY Right 2010   Dr. Percell Miller   TOTAL KNEE ARTHROPLASTY Right 08/25/2014   Procedure: TOTAL KNEE ARTHROPLASTY;  Surgeon: Ninetta Lights, MD;  Location: [facility];  Service: Orthopedics;  Laterality: Right;   Family History:  Family History  Problem Relation Age of Onset   Hepatitis Mother    Cirrhosis Mother        Did not drink   Pneumonia Father    Kidney failure Father    Gastric cancer Brother        ? Gastric CA spread to esophagus   Colon cancer Neg Hx    Family Psychiatric  History:see H&P  Social History:  Social History   Substance and Sexual Activity  Alcohol Use Yes   Comment: 10-20/day cans beer     Social History   Substance and Sexual Activity  Drug Use No    Social History    Socioeconomic History   Marital status: Single    Spouse name: Not on file   Number of children: Not on file   Years of education: Not on file   Highest education level: Not on file  Occupational History   Occupation: unemployed  Tobacco Use   Smoking status: Every Day    Packs/day: 1.50    Years: 25.00    Pack years: 37.50    Types: Cigarettes   Smokeless tobacco: Never  Vaping Use   Vaping Use: Never used  Substance and Sexual Activity   Alcohol use: Yes    Comment: 10-20/day cans beer   Drug use: No   Sexual activity: Yes  Other Topics Concern   Not on file  Social History Narrative   Not on file   Social Determinants of Health   Financial Resource Strain: Not on file  Food Insecurity: Not on file  Transportation Needs: Not on file  Physical Activity: Not on file  Stress: Not on file  Social Connections: Not on file   Additional Social History:   Sleep: good  Appetite:  Good  Current Medications: Current Facility-Administered Medications  Medication Dose Route Frequency Provider Last Rate Last Admin   alum & mag hydroxide-simeth (MAALOX/MYLANTA) 200-200-20 MG/5ML suspension 30 mL  30 mL Oral Q4H PRN Prescilla Sours, PA-C       B-complex with vitamin C tablet 1 tablet  1 tablet Oral Daily Hill, Jackie Plum, MD   1 tablet at 10/14/20 0900   gabapentin (NEURONTIN) capsule 400 mg  400 mg Oral TID Maida Sale, MD   400 mg at 10/14/20 1206   hydrOXYzine (ATARAX/VISTARIL) tablet 25 mg  25 mg Oral Q6H PRN Maida Sale, MD   25 mg at 10/13/20 2203   magnesium hydroxide (MILK OF MAGNESIA) suspension 30 mL  30 mL Oral Daily PRN Margorie John W, PA-C       mirtazapine (REMERON) tablet 22.5 mg  22.5 mg Oral QHS Hill, Jackie Plum, MD   22.5 mg at 10/13/20 2203   multivitamin with minerals tablet 1 tablet  1 tablet Oral Daily Margorie John W, PA-C   1 tablet at 10/14/20 0900   nicotine (NICODERM CQ - dosed in mg/24 hr) patch 7 mg  7 mg Transdermal  Daily Nelda Marseille, Amy E, MD   7 mg at 10/14/20 0900   omega-3 acid ethyl esters (LOVAZA) capsule 1 g  1 g Oral BID Maida Sale, MD   1 g at 10/14/20 0900   pantoprazole (PROTONIX) EC tablet 40 mg  40 mg Oral Daily Nelda Marseille, Amy E, MD   40 mg at 10/14/20 0900   sucralfate (CARAFATE) 1 GM/10ML suspension 1 g  1 g Oral TID WC & HS Armando Reichert, MD   1 g at 10/14/20 1207    Lab Results:  Results for orders placed or performed during the hospital encounter of 10/07/20 (from the past 48 hour(s))  Hepatic function panel     Status: Abnormal   Collection Time: 10/12/20  6:25 PM  Result Value Ref Range   Total Protein 7.4 6.5 - 8.1 g/dL   Albumin 4.2 3.5 - 5.0 g/dL   AST 85 (H) 15 - 41 U/L   ALT 74 (H) 0 - 44 U/L   Alkaline Phosphatase 133 (H) 38 - 126 U/L   Total Bilirubin 0.7 0.3 - 1.2 mg/dL   Bilirubin, Direct 0.1 0.0 - 0.2 mg/dL   Indirect Bilirubin 0.6 0.3 - 0.9 mg/dL    Comment: Performed at [facility], [place] [address] [place], [place] [address]    Blood Alcohol level:  Lab Results  Component Value Date   ETH 270 (H) 10/06/2020   ETH 188 (H) 01/30/3233    Metabolic Disorder Labs: Lab Results  Component Value Date   HGBA1C 4.3 (L) 10/08/2020   MPG 76.71 10/08/2020   MPG 85.32 05/19/2020   No results found for: PROLACTIN Lab Results  Component Value Date   CHOL 122 10/08/2020   TRIG 62 10/08/2020   HDL 61 10/08/2020   CHOLHDL 2.0 10/08/2020   VLDL 12 10/08/2020   LDLCALC 49 10/08/2020    Physical Findings: AIMS: Facial and Oral Movements Muscles of Facial Expression: None, normal Lips and Perioral Area: None, normal Jaw: None, normal Tongue: None, normal,Extremity Movements Upper (arms, wrists, hands, fingers): None, normal  Lower (legs, knees, ankles, toes): None, normal, Trunk Movements Neck, shoulders, hips: None, normal, Overall Severity Severity of abnormal movements (highest score from questions above): None,  normal Incapacitation due to abnormal movements: None, normal Patient's awareness of abnormal movements (rate only patient's report): No Awareness, Dental Status Current problems with teeth and/or dentures?: Yes Does patient usually wear dentures?: No  CIWA:  CIWA-Ar Total: 0   Musculoskeletal: Strength & Muscle Tone: normal Gait & Station:  normal Patient leans: N/A  Psychiatric Specialty Exam:  Presentation  General Appearance: Appropriate for environment.  Eye Contact:Fair  Speech:Clear and Coherent  Speech Volume: Normal  Handedness:Right   Mood and Affect  Mood:dysphoric   Affect:Depressed   Thought Process  Thought Processes:Coherent  Descriptions of Associations:Intact  Orientation:Full (Time, Place and Person)  Thought Content:Logical  - no evidence of delusions, paranoia or psychosis on exam  History of Schizophrenia/Schizoaffective disorder:No data recorded Duration of Psychotic Symptoms:No data recorded Hallucinations:Hallucinations: None  Ideas of Reference:None  Suicidal Thoughts:Suicidal Thoughts: No  Homicidal Thoughts:Homicidal Thoughts: No   Sensorium  Memory:Immediate Fair; Recent Fair; Remote Fair  Judgment:Improving   Insight:Fair   Executive Functions  Concentration:Good  Attention Span:Good  [place]  Language:Good   Psychomotor Activity  Psychomotor Activity:Psychomotor Activity: Normal   Assets  Assets:Communication Skills; Desire for Improvement; Financial Resources/Insurance; Resilience   Sleep  Sleep:Sleep: Good Number of Hours of Sleep: 6.25    Physical Exam: Physical Exam Vitals and nursing note reviewed.  Constitutional:      General: He is not in acute distress.    Appearance: Normal appearance.  HENT:     Head: Normocephalic and atraumatic.     Mouth/Throat:     Comments: Poor dentition Pulmonary:     Effort: Pulmonary effort is normal.  Neurological:      General: No focal deficit present.     Mental Status: He is alert.   Review of Systems  Constitutional:  Negative for chills and diaphoresis.  Respiratory:  Negative for shortness of breath.   Cardiovascular:  Negative for chest pain.  Gastrointestinal:  Negative for abdominal pain, constipation, diarrhea, heartburn, nausea and vomiting.  Genitourinary: Negative.   Neurological:  Negative for dizziness, tremors and headaches.  Psychiatric/Behavioral:  Positive for depression. Negative for hallucinations and suicidal ideas. The patient is nervous/anxious.   Blood pressure 99/69, pulse (!) 44, temperature (!) 97.5 F (36.4 C), temperature source Oral, resp. rate 18, height 6\' 3"  (1.905 m), weight 90.7 kg, SpO2 100 %. Body mass index is 25 kg/m.   Treatment Plan Summary: Daily contact with patient to assess and evaluate symptoms and progress in treatment  Alcohol induced depressive d/o (r/o MDD) -Continue Remeron 22.5mg  PO daily -monitor vegetative symptoms.    Alcohol use d/o - severe dependence  Last CIWA 0. -Patient completed Ativan taper. -Continue B vitamin replacement, multivitamin -Continue gabapentin 400 mg 3 times daily for alcohol craving, anxiety, and potential seizures - SW to assist with referrals to [facility] and patient counseled on the need to abstain from alcohol use.  Patient declines residential rehab referral. - Continue Protonix 40mg  daily for GI protection   Transaminitis - 9/21 - AST 85 and ALT 74 (trending down) -Alkaline phosphatase 133 - PT/INR 12.3/0.9, PTT 28 -Hep A, hep C, hep B nonreactive on 4/28 - will need outpatient f/u after discharge  H/o high TSH On 4/28 -TSH 5.479, T4 low at 0.59, T3 uptake ratio high at 45. On 9/17 TSH 4.046 - patient currently  off Synthroid and advised to f/u with PCP for monitoring after discharge  Thrombocytopenia Platelets 100 on 9/18 and on 9/17 - Appears chronic; will need outpatient f/u after discharge  ANA  positive ANA positive for ribonucleoprotein 6.8.  Will recommend to follow-up with PCP at discharge.  H/o withdrawal seizures Patient had withdrawal seizure 2 years ago, no other seizures outside of alcohol use.  Patient has not taken Keppra since June - Continue Neurontin 300mg  TID. - Seizure precautions.      Nicotine dependence -Nicotine Patch 7 mg daily  Asymptomatic bradycardia -Pulse rate chronically low in 40's and 50's. Most recent 44/min -medicine was consulted initially. Recommended to call cardiology if pulse rate goes in the 30s or patient complains of any symptoms. -Repeat EKG shows marked sinus bradycardia with pulse rate 42/min.   Off-and-on stabbing chest pain -No chest pain  today -.Repeat EKG on 9/21 shows marked sinus bradycardia with pulse rate 42/min.  No ST wave changes.  Pulse rate chronically low. -Monitor vitals and symptoms. -Continue Carafate 1 g oral 3 times daily. -Continue Protonix 40 mg daily.   PRN's -Continue Milk of Magnesia 30 ml PRN Daily for Constipation. -Continue Maalox/Mylanta 30 ml Q4H PRN for Indigestion. -Continue hydroxyzine 25 mg TID PRN for Anxiety. -Continue trazodone 50 mg QHS PRN for sleep.  Armando Reichert, MD, PGY 2 10/14/2020, 12:34 PM

## 2020-10-14 NOTE — Progress Notes (Signed)
Did not attend group 

## 2020-10-14 NOTE — Progress Notes (Signed)
   10/13/20 1950  Psych Admission Type (Psych Patients Only)  Admission Status Voluntary  Psychosocial Assessment  Patient Complaints Anxiety;Depression  Eye Contact Brief  Facial Expression Anxious  Affect Depressed;Anxious  Speech Logical/coherent  Interaction Assertive  Motor Activity Slow  Appearance/Hygiene Unremarkable  Behavior Characteristics Cooperative;Anxious  Mood Depressed;Anxious;Pleasant  Thought Process  Coherency WDL  Content WDL  Delusions None reported or observed  Perception WDL  Hallucination None reported or observed  Judgment WDL  Confusion None  Danger to Self  Current suicidal ideation? Denies  Danger to Others  Danger to Others None reported or observed   Pt seen in dayroom. Pt denies SI, HI, AVH and pain. Pt rates anxiety and depression both 7/10. Pt told that his medication dosages were being increased for sleep and mood control. Pt denies any withdrawal symptoms. Pt just wants to get a restful night sleep.

## 2020-10-14 NOTE — Progress Notes (Signed)
   10/14/20 2126  Psych Admission Type (Psych Patients Only)  Admission Status Voluntary  Psychosocial Assessment  Patient Complaints Anxiety;Sadness  Eye Contact Brief  Facial Expression Anxious;Pained  Affect Appropriate to circumstance;Depressed  Speech Logical/coherent  Interaction Assertive  Motor Activity Slow  Appearance/Hygiene Unremarkable  Behavior Characteristics Cooperative;Appropriate to situation  Mood Depressed;Pleasant  Thought Process  Coherency WDL  Content WDL  Delusions None reported or observed  Perception WDL  Hallucination None reported or observed  Judgment WDL  Confusion None  Danger to Self  Current suicidal ideation? Denies  Danger to Others  Danger to Others None reported or observed

## 2020-10-15 MED ORDER — MIRTAZAPINE 30 MG PO TABS
30.0000 mg | ORAL_TABLET | Freq: Every day | ORAL | Status: DC
  Administered 2020-10-15 – 2020-10-16 (×2): 30 mg via ORAL
  Filled 2020-10-15 (×5): qty 1

## 2020-10-15 NOTE — Progress Notes (Signed)
   10/15/20 2106  Psych Admission Type (Psych Patients Only)  Admission Status Voluntary  Psychosocial Assessment  Patient Complaints Anxiety;Depression  Eye Contact Brief  Facial Expression Anxious;Pained  Affect Appropriate to circumstance;Depressed  Speech Logical/coherent  Interaction Assertive  Motor Activity Slow  Appearance/Hygiene Unremarkable  Behavior Characteristics Cooperative  Mood Pleasant;Anxious  Thought Process  Coherency WDL  Content WDL  Delusions None reported or observed  Perception WDL  Hallucination None reported or observed  Judgment WDL  Confusion None  Danger to Self  Current suicidal ideation? Denies  Danger to Others  Danger to Others None reported or observed

## 2020-10-15 NOTE — Progress Notes (Signed)
   10/15/20 1015  Psych Admission Type (Psych Patients Only)  Admission Status Voluntary  Psychosocial Assessment  Patient Complaints Anxiety  Eye Contact Brief  Facial Expression Anxious;Pained  Affect Appropriate to circumstance;Depressed  Speech Logical/coherent  Interaction Assertive  Motor Activity Slow  Appearance/Hygiene Unremarkable  Behavior Characteristics Cooperative;Unable to participate  Mood Depressed;Pleasant  Thought Process  Coherency WDL  Content WDL  Delusions None reported or observed  Perception WDL  Hallucination None reported or observed  Judgment WDL  Confusion None  Danger to Self  Current suicidal ideation? Denies  Danger to Others  Danger to Others None reported or observed  Pt denies SI/HI/AVH.  Pt Endorses depression and anxiety.  Pt interacting with other pt's in the milieu, and pt is having no signs or symptoms of acute distress.  RN administered medications per provider orders and assessed for needs and concerns.  Pt is safe at this time with q 15 min checks in place.

## 2020-10-15 NOTE — Progress Notes (Signed)
[facility] MD Progress Note  10/15/2020 2:09 PM Brandon Miller  MRN:  [id number]  Chief Complaint: SI and alcohol abuse  Reason for admission: Brandon Miller is a 55 year old male with a psychiatric history of alcohol use disorder- severe/dependent and depression, and a medical history of diabetes mellitus, who initially presented to [facility] ED for evaluation of suicidal ideation with a plan to shoot himself if he goes home.The patient is currently on [facility] Day 8.   Chart Review from last 24 hours: The patient's chart was reviewed and nursing notes were reviewed. The patient's case was discussed in multidisciplinary team meeting.  He had no acute safety or behavioral concerns noted. Per [facility] he was compliant with his medications except sucralfate  He received as needed hydroxyzine.  Patient most recent CIWA was 0.  Patient continue to attend some of the groups.  Per social worker patient can return back to sisters home but only after Sunday as younger nephews at home this weekend who can make him anxious.  Information Obtained Today During Patient Interview: The patient was seen and evaluated on the unit today. He states he did not sleep well last night as his roommate was snoring.  He states his mood has been okay and thinks that it is improving.  He reports anxiety.  He denies SI, HI, AVH, paranoia, or delusions.  He denies nightmares or intrusive thoughts related to past trauma.   He did not talk to his sister today.  He states that Remeron has been helping him and thinks that it is better than Prozac.  He has been tolerating medication well without any side effects.  He denies headache, nausea, dizziness, chest pain, shortness of breath, abdominal pain, diarrhea, and constipation. Patient is guarded and answers questions in short sentences.   Principal Problem: Alcohol-induced mood disorder with depressive symptoms (HCC) Diagnosis: Principal Problem:   Alcohol-induced mood disorder with depressive  symptoms (HCC) Active Problems:   Diabetes mellitus, type 2 (HCC)   Elevated LFTs   Alcohol dependence ([facility])   GERD (gastroesophageal reflux disease)   History of seizures   Cognitive complaints  Total Time spent with patient: I personally spent 25 minutes on the unit in direct patient care. The direct patient care time included face-to-face time with the patient, reviewing the patient's chart, communicating with other professionals, and coordinating care. Greater than 50% of this time was spent in counseling or coordinating care with the patient regarding goals of hospitalization, psycho-education, and discharge planning needs.   Past Psychiatric History: Alcohol dependence, elevated LFTs, alcohol induced mood disorder. He has been complaint with his medication. He has been taking Prozac 20 mg daily.  He states he has tried Dentist  Past Medical History:  Past Medical History:  Diagnosis Date   Allergy    Anemia    Anxiety    Arthritis    RA   Complication of anesthesia    hard to wake up-was told he may have sleep apnea-never tested   Depression    Diabetes mellitus without complication ([facility])    with weight no more problems   Seizure ([place])    had one seizure of unknown etiology a few tears ago, none since and on no meds for them   Sleep apnea    Snores     Past Surgical History:  Procedure Laterality Date   BIOPSY  02/12/2019   Procedure: BIOPSY;  Surgeon: Daneil Dolin, MD;  [facility];  Service: Endoscopy;;  stomach   COLONOSCOPY WITH PROPOFOL N/A 02/12/2019   Procedure: COLONOSCOPY WITH PROPOFOL;  Surgeon: Daneil Dolin, MD;  [facility];  Service: Endoscopy;  Laterality: N/A;  10:30am   ELBOW SURGERY Right 2010   Dr. Percell Miller   ESOPHAGOGASTRODUODENOSCOPY (EGD) WITH PROPOFOL N/A 02/12/2019   Procedure: ESOPHAGOGASTRODUODENOSCOPY (EGD) WITH PROPOFOL;  Surgeon: Daneil Dolin, MD;  [facility];  Service: Endoscopy;   Laterality: N/A;   HERNIA REPAIR     Right inguinal   KNEE ARTHROSCOPY WITH MEDIAL MENISECTOMY Right 06/11/2013   Procedure: RIGHT KNEE ARTHROSCOPY WITH MEDIAL MENISECTOMY AND CHONDROPLASTY;  Surgeon: Ninetta Lights, MD;  Location: [place];  Service: Orthopedics;  Laterality: Right;  Chondroplasty, loose bodies, medial menisectomy, lysis of adhesions.   KNEE SURGERY Right 2011   POLYPECTOMY  02/12/2019   Procedure: POLYPECTOMY;  Surgeon: Daneil Dolin, MD;  [facility];  Service: Endoscopy;;   right ankle     SHOULDER SURGERY Right 2010   Dr. Percell [facility]   TOTAL KNEE ARTHROPLASTY Right 08/25/2014   Procedure: TOTAL KNEE ARTHROPLASTY;  Surgeon: Ninetta Lights, MD;  Location: [place];  Service: Orthopedics;  Laterality: Right;   Family History:  Family History  Problem Relation Age of Onset   Hepatitis Mother    Cirrhosis Mother        Did not drink   Pneumonia Father    Kidney failure Father    Gastric cancer Brother        ? Gastric CA spread to esophagus   Colon cancer Neg Hx    Family Psychiatric  History:see H&P  Social History:  Social History   Substance and Sexual Activity  Alcohol Use Yes   Comment: 10-20/day cans beer     Social History   Substance and Sexual Activity  Drug Use No    Social History   Socioeconomic History   Marital status: Single    Spouse name: Not on file   Number of children: Not on file   Years of education: Not on file   Highest education level: Not on file  Occupational History   Occupation: unemployed  Tobacco Use   Smoking status: Every Day    Packs/day: 1.50    Years: 25.00    Pack years: 37.50    Types: Cigarettes   Smokeless tobacco: Never  Vaping Use   Vaping Use: Never used  Substance and Sexual Activity   Alcohol use: Yes    Comment: 10-20/day cans beer   Drug use: No   Sexual activity: Yes  Other Topics Concern   Not on file  Social History Narrative   Not on file   Social Determinants  of Health   Financial Resource Strain: Not on file  Food Insecurity: Not on file  Transportation Needs: Not on file  Physical Activity: Not on file  Stress: Not on file  Social Connections: Not on file   Additional Social History:   Sleep: good  Appetite:  Good  Current Medications: Current Facility-Administered Medications  Medication Dose Route Frequency Provider Last Rate Last Admin   alum & mag hydroxide-simeth (MAALOX/MYLANTA) 200-200-20 MG/5ML suspension 30 mL  30 mL Oral Q4H PRN Margorie John W, PA-C       B-complex with vitamin C tablet 1 tablet  1 tablet Oral Daily Hill, Jackie Plum, MD   1 tablet at 10/15/20 1014   gabapentin (NEURONTIN) capsule 400 mg  400 mg Oral TID Magdalen Spatz  Leigh, MD   400 mg at 10/15/20 1253   hydrOXYzine (ATARAX/VISTARIL) tablet 25 mg  25 mg Oral Q6H PRN Maida Sale, MD   25 mg at 10/15/20 1014   magnesium hydroxide (MILK OF MAGNESIA) suspension 30 mL  30 mL Oral Daily PRN Margorie John W, PA-C       mirtazapine (REMERON) tablet 30 mg  30 mg Oral QHS Armando Reichert, MD       multivitamin with minerals tablet 1 tablet  1 tablet Oral Daily Margorie John W, PA-C   1 tablet at 10/15/20 1014   nicotine (NICODERM CQ - dosed in mg/24 hr) patch 7 mg  7 mg Transdermal Daily Nelda Marseille, Amy E, MD   7 mg at 10/15/20 1013   omega-3 acid ethyl esters (LOVAZA) capsule 1 g  1 g Oral BID Hill, Jackie Plum, MD   1 g at 10/15/20 1014   pantoprazole (PROTONIX) EC tablet 40 mg  40 mg Oral Daily Nelda Marseille, Amy E, MD   40 mg at 10/15/20 1014   sucralfate (CARAFATE) 1 GM/10ML suspension 1 g  1 g Oral TID WC & HS Armando Reichert, MD   1 g at 10/15/20 1015    Lab Results:  No results found for this or any previous visit (from the past 37 hour(s)).   Blood Alcohol level:  Lab Results  Component Value Date   ETH 270 (H) 10/06/2020   ETH 188 (H) 60/63/0160    Metabolic Disorder Labs: Lab Results  Component Value Date   HGBA1C 4.3 (L) 10/08/2020    MPG 76.71 10/08/2020   MPG 85.32 05/19/2020   No results found for: PROLACTIN Lab Results  Component Value Date   CHOL 122 10/08/2020   TRIG 62 10/08/2020   HDL 61 10/08/2020   CHOLHDL 2.0 10/08/2020   VLDL 12 10/08/2020   LDLCALC 49 10/08/2020    Physical Findings: AIMS: Facial and Oral Movements Muscles of Facial Expression: None, normal Lips and Perioral Area: None, normal Jaw: None, normal Tongue: None, normal,Extremity Movements Upper (arms, wrists, hands, fingers): None, normal Lower (legs, knees, ankles, toes): None, normal, Trunk Movements Neck, shoulders, hips: None, normal, Overall Severity Severity of abnormal movements (highest score from questions above): None, normal Incapacitation due to abnormal movements: None, normal Patient's awareness of abnormal movements (rate only patient's report): No Awareness, Dental Status Current problems with teeth and/or dentures?: Yes Does patient usually wear dentures?: No  CIWA:  CIWA-Ar Total: 1   Musculoskeletal: Strength & Muscle Tone: normal Gait & Station:  normal Patient leans: N/A  Psychiatric Specialty Exam:  Presentation  General Appearance: Appropriate for environment.  Eye Contact:Fair  Speech:Clear and Coherent  Speech Volume: Normal  Handedness:Right   Mood and Affect  Mood:dysphoric   Affect:Blunt   Thought Process  Thought Processes:Coherent  Descriptions of Associations:Intact  Orientation:Full (Time, Place and Person)  Thought Content:Logical  - no evidence of delusions, paranoia or psychosis on exam  History of Schizophrenia/Schizoaffective disorder:No data recorded Duration of Psychotic Symptoms:No data recorded Hallucinations:Hallucinations: None  Ideas of Reference:None  Suicidal Thoughts:Suicidal Thoughts: No  Homicidal Thoughts:Homicidal Thoughts: No   Sensorium  Memory:Immediate Fair; Recent Fair; Remote Fair  Judgment:Improving   Insight:Fair   Executive  Functions  Concentration:Good  Attention Span:Good  [place] of Knowledge:Good  Language:Good   Psychomotor Activity  Psychomotor Activity:Psychomotor Activity: Normal   Assets  Assets:Communication Skills; Desire for Improvement; Financial Resources/Insurance; Resilience   Sleep  Sleep:Sleep: Good Number of Hours of Sleep:  6.75    Physical Exam: Physical Exam Vitals and nursing note reviewed.  Constitutional:      General: He is not in acute distress.    Appearance: Normal appearance. He is not ill-appearing, toxic-appearing or diaphoretic.  HENT:     Head: Normocephalic and atraumatic.     Mouth/Throat:     Comments: Poor dentition Pulmonary:     Effort: Pulmonary effort is normal.  Neurological:     General: No focal deficit present.     Mental Status: He is alert.   Review of Systems  Constitutional:  Negative for chills and diaphoresis.  Respiratory:  Negative for shortness of breath.   Cardiovascular:  Negative for chest pain.  Gastrointestinal:  Negative for abdominal pain, constipation, diarrhea, heartburn, nausea and vomiting.  Genitourinary: Negative.   Neurological:  Negative for dizziness, tremors and headaches.  Psychiatric/Behavioral:  Positive for depression. Negative for hallucinations and suicidal ideas. The patient is nervous/anxious.   Blood pressure 106/71, pulse (!) 46, temperature (!) 97.5 F (36.4 C), temperature source Oral, resp. rate 18, height 6\' 3"  (1.905 m), weight 90.7 kg, SpO2 100 %. Body mass index is 25 kg/m.   Treatment Plan Summary: Daily contact with patient to assess and evaluate symptoms and progress in treatment  Alcohol induced depressive d/o (r/o MDD) -Increase Remeron to 30mg  PO daily -monitor vegetative symptoms.    Alcohol use d/o - severe dependence  Last CIWA 1. -Patient completed Ativan taper. -Continue B vitamin replacement, multivitamin -Continue gabapentin 400 mg 3 times daily for alcohol  craving, anxiety, and potential seizures - SW to assist with referrals to [facility] and patient counseled on the need to abstain from alcohol use.  Patient declines residential rehab referral. - Continue Protonix 40mg  daily for GI protection   Transaminitis - 9/21 - AST 85 and ALT 74 (trending down) -Alkaline phosphatase 133 - PT/INR 12.3/0.9, PTT 28 -Hep A, hep C, hep B nonreactive on 4/28 - will need outpatient f/u after discharge  H/o high TSH On 4/28 -TSH 5.479, T4 low at 0.59, T3 uptake ratio high at 45. On 9/17 TSH 4.046 - patient currently off Synthroid and advised to f/u with PCP for monitoring after discharge  Thrombocytopenia Platelets 100 on 9/18 and on 9/17 - Appears chronic; will need outpatient f/u after discharge  ANA positive ANA positive for ribonucleoprotein 6.8.  Will recommend to follow-up with PCP at discharge.  H/o withdrawal seizures Patient had withdrawal seizure 2 years ago, no other seizures outside of alcohol use.  Patient has not taken Keppra since June - Continue Neurontin 300mg  TID. - Seizure precautions.      Nicotine dependence -Nicotine Patch 7 mg daily  Asymptomatic bradycardia -Pulse rate chronically low in 40's and 50's. Most recent 46/min -medicine was consulted initially. Recommended to call cardiology if pulse rate goes in the 30s or patient complains of any symptoms. -Repeat EKG shows marked sinus bradycardia with pulse rate 42/min.   Off-and-on stabbing chest pain -No chest pain  today -.Repeat EKG on 9/21 shows marked sinus bradycardia with pulse rate 42/min.  No ST wave changes.  Pulse rate chronically low. -Monitor vitals and symptoms. -Continue Carafate 1 g oral 3 times daily. -Continue Protonix 40 mg daily.   PRN's -Continue Milk of Magnesia 30 ml PRN Daily for Constipation. -Continue Maalox/Mylanta 30 ml Q4H PRN for Indigestion. -Continue hydroxyzine 25 mg TID PRN for Anxiety. -Continue trazodone 50 mg QHS PRN for  sleep.  Armando Reichert, MD, PGY 2 10/15/2020,  2:09 PM

## 2020-10-15 NOTE — Progress Notes (Signed)
Pt did not attend orientation/goals group. 

## 2020-10-15 NOTE — Progress Notes (Signed)
Adult Psychoeducational Group Note  Date:  10/15/2020 Time:  9:31 PM  Group Topic/Focus:  Wrap-Up Group:   The focus of this group is to help patients review their daily goal of treatment and discuss progress on daily workbooks.  Participation Level:  Active  Participation Quality:  Appropriate  Affect:  Appropriate  Cognitive:  Appropriate  Insight: Appropriate  Engagement in Group:  Engaged  Modes of Intervention:  Education  Additional Comments:  Patient attended and participated in group tonight. He reports that today was good because he woke up and is still breathing. He went through the day without incident.  Salley Scarlet [facility] 10/15/2020, 9:31 PM

## 2020-10-15 NOTE — Progress Notes (Signed)
Pt was given mindfulness skills packet to go over, review and ask questions for psycho-ed group.

## 2020-10-15 NOTE — Plan of Care (Signed)
  Problem: Education: Goal: Emotional status will improve Outcome: Progressing Goal: Verbalization of understanding the information provided will improve Outcome: Progressing   Problem: Coping: Goal: Level of anxiety will decrease Outcome: Not Progressing

## 2020-10-15 NOTE — Group Note (Signed)
LCSW Group Therapy Note   Group Date: 10/15/2020 Start Time: 1015am End Time: 1115am   Type of Therapy and Topic:  Group Therapy:   Participation Level:  Active  Description of Group:  In group today, the group was introduced to the concept of "Unhelpful Thinking Styles."  A few specific types of cognitive distortions were described and discussed including Mental Filter, Jumping to Conclusions, Personalization, Catastrophizing, Black and White Thinking, Shoulding and Musting, Labeling, Overgeneralizing, and Disqualifying/Ignoring the Positives.  Examples were given of each and the group was asked to provide personal examples.  Patients were then asked to think about which of these types of thinking they tend to utilize and how that might affect their interactions with people in their lives.  Therapeutic Goals:  1.  Learn about cognitive distortions. 2.   Allow patients the opportunity to reflect on patterns of unhelpful thinking they may engage in. 3.  Discuss the types of unhelpful thinking that individual patients tend to use. 4.  Encourage patients to continue to consider how their cognitive distortions may influence their relationships and what steps they may wish to take to make changes in their life for better outcomes.   Summary of Patient Progress:    The patient was fully participatory and engaged throughout group, made insightful and helpful contributions to the group discussion.  He talked about a 54 year old grudge that he continues to carry and the group made multiple suggestions to him about how to approach getting rid of that.  He was receptive to the feedback.  He was appropriate at all times and appeared to have gained insight during his hospital stay.  Therapeutic Modalities:  Psychoeducation Processing  Maretta Los, Latanya Presser 10/15/2020

## 2020-10-16 MED ORDER — QUETIAPINE FUMARATE 25 MG PO TABS
25.0000 mg | ORAL_TABLET | Freq: Every day | ORAL | Status: DC
  Administered 2020-10-16: 25 mg via ORAL
  Filled 2020-10-16 (×4): qty 1

## 2020-10-16 NOTE — Progress Notes (Signed)
Pt stated he was doing better, pt visible on the unit some this evening    10/16/20 2100  Psych Admission Type (Psych Patients Only)  Admission Status Voluntary  Psychosocial Assessment  Patient Complaints Anxiety  Eye Contact Avoids  Facial Expression Other (Comment) (asleep)  Affect Appropriate to circumstance  Speech Logical/coherent  Interaction Assertive  Motor Activity Slow  Appearance/Hygiene Unremarkable  Behavior Characteristics Calm  Mood Pleasant  Thought Process  Coherency WDL  Content WDL  Delusions None reported or observed  Perception WDL  Hallucination None reported or observed  Judgment Poor  Confusion WDL  Danger to Self  Current suicidal ideation? Denies  Danger to Others  Danger to Others None reported or observed

## 2020-10-16 NOTE — Progress Notes (Signed)
[employer] Group Notes:  (Nursing/MHT/Case Management/Adjunct)  Date:  10/16/2020  Time:  2015  Type of Therapy:   wrap up group  Participation Level:  Active  Participation Quality:  Appropriate, Attentive, Sharing, and Supportive  Affect:  Appropriate  Cognitive:  Alert  Insight:  Improving  Engagement in Group:  Engaged  Modes of Intervention:  Clarification, Education, and Support  Summary of Progress/Problems: Positive thinking and positive change were discussed. Pt reports looking forward to discharge tomorrow and plans on finding a new place to live. Pt is grateful for his family.   Shellia Cleverly 10/16/2020, 8:58 PM

## 2020-10-16 NOTE — Progress Notes (Addendum)
[facility] MD Progress Note  10/16/2020 7:57 AM Brandon Miller  MRN:  [id number]  Chief Complaint: SI and alcohol abuse  Reason for admission: Brandon Miller is a 55 year old male with a psychiatric history of alcohol use disorder- severe/dependent and depression, and a medical history of diabetes mellitus, who initially presented to [facility] for evaluation of suicidal ideation with a plan to shoot himself if he goes home.The patient is currently on Hospital Day 9.   Chart Review from last 24 hours: The patient's chart was reviewed and nursing notes were reviewed. The patient's case was discussed in multidisciplinary team meeting.  He had no acute safety or behavioral concerns noted. Per [facility] he was compliant.  He received as needed hydroxyzine.  Patient most recent CIWA was 0.  Patient continue to attend and participate in some of the groups.  Patient did not attend this morning group.  Per social worker patient can return back to sisters home but only after Sunday as younger nephews at home this weekend who can make him anxious.  Information Obtained Today During Patient Interview: The patient was seen and evaluated on the unit today.  Patient found sleeping in his room.  He states he did not sleep well last night as his roommate was snoring.  He states he slept only for an hour or so and feels sleepy now.  He states his mood has been okay and thinks that it is improving.  He still reports anxiety.  He denies SI, HI, AVH, paranoia, or delusions.  He denies nightmares or intrusive thoughts related to past trauma.   He did not talk to his sister today.  He states he talked to his sister few days ago who is going to pick him up at discharge.  He has been tolerating medication well without any side effects.  He denies headache, nausea, dizziness, chest pain, shortness of breath, abdominal pain, diarrhea, and constipation. Patient is guarded and answers questions in short sentences.   Principal Problem:  Alcohol-induced mood disorder with depressive symptoms (HCC) Diagnosis: Principal Problem:   Alcohol-induced mood disorder with depressive symptoms (HCC) Active Problems:   Diabetes mellitus, type 2 (HCC)   Elevated LFTs   Alcohol dependence ([place])   GERD (gastroesophageal reflux disease)   History of seizures   Cognitive complaints  Total Time spent with patient: I personally spent 25 minutes on the unit in direct patient care. The direct patient care time included face-to-face time with the patient, reviewing the patient's chart, communicating with other professionals, and coordinating care. Greater than 50% of this time was spent in counseling or coordinating care with the patient regarding goals of hospitalization, psycho-education, and discharge planning needs.   Past Psychiatric History: Alcohol dependence, elevated LFTs, alcohol induced mood disorder. He has been complaint with his medication. He has been taking Prozac 20 mg daily.  He states he has tried Dentist  Past Medical History:  Past Medical History:  Diagnosis Date   Allergy    Anemia    Anxiety    Arthritis    RA   Complication of anesthesia    hard to wake up-was told he may have sleep apnea-never tested   Depression    Diabetes mellitus without complication ([place])    with weight no more problems   Seizure ([place])    had one seizure of unknown etiology a few tears ago, none since and on no meds for them   Sleep apnea    Snores  Past Surgical History:  Procedure Laterality Date   BIOPSY  02/12/2019   Procedure: BIOPSY;  Surgeon: Daneil Dolin, MD;  [facility];  Service: Endoscopy;;  stomach   COLONOSCOPY WITH PROPOFOL N/A 02/12/2019   Procedure: COLONOSCOPY WITH PROPOFOL;  Surgeon: Daneil Dolin, MD;  [facility];  Service: Endoscopy;  Laterality: N/A;  10:30am   ELBOW SURGERY Right 2010   Dr. Percell Miller   ESOPHAGOGASTRODUODENOSCOPY (EGD) WITH PROPOFOL N/A 02/12/2019    Procedure: ESOPHAGOGASTRODUODENOSCOPY (EGD) WITH PROPOFOL;  Surgeon: Daneil Dolin, MD;  [facility];  Service: Endoscopy;  Laterality: N/A;   HERNIA REPAIR     Right inguinal   KNEE ARTHROSCOPY WITH MEDIAL MENISECTOMY Right 06/11/2013   Procedure: RIGHT KNEE ARTHROSCOPY WITH MEDIAL MENISECTOMY AND CHONDROPLASTY;  Surgeon: Ninetta Lights, MD;  Location: [facility];  Service: Orthopedics;  Laterality: Right;  Chondroplasty, loose bodies, medial menisectomy, lysis of adhesions.   KNEE SURGERY Right 2011   POLYPECTOMY  02/12/2019   Procedure: POLYPECTOMY;  Surgeon: Daneil Dolin, MD;  [facility];  Service: Endoscopy;;   right ankle     SHOULDER SURGERY Right 2010   Dr. Percell Miller   TOTAL KNEE ARTHROPLASTY Right 08/25/2014   Procedure: TOTAL KNEE ARTHROPLASTY;  Surgeon: Ninetta Lights, MD;  Location: [facility];  Service: Orthopedics;  Laterality: Right;   Family History:  Family History  Problem Relation Age of Onset   Hepatitis Mother    Cirrhosis Mother        Did not drink   Pneumonia Father    Kidney failure Father    Gastric cancer Brother        ? Gastric CA spread to esophagus   Colon cancer Neg Hx    Family Psychiatric  History:see H&P  Social History:  Social History   Substance and Sexual Activity  Alcohol Use Yes   Comment: 10-20/day cans beer     Social History   Substance and Sexual Activity  Drug Use No    Social History   Socioeconomic History   Marital status: Single    Spouse name: Not on file   Number of children: Not on file   Years of education: Not on file   Highest education level: Not on file  Occupational History   Occupation: unemployed  Tobacco Use   Smoking status: Every Day    Packs/day: 1.50    Years: 25.00    Pack years: 37.50    Types: Cigarettes   Smokeless tobacco: Never  Vaping Use   Vaping Use: Never used  Substance and Sexual Activity   Alcohol use: Yes    Comment: 10-20/day cans beer    Drug use: No   Sexual activity: Yes  Other Topics Concern   Not on file  Social History Narrative   Not on file   Social Determinants of Health   Financial Resource Strain: Not on file  Food Insecurity: Not on file  Transportation Needs: Not on file  Physical Activity: Not on file  Stress: Not on file  Social Connections: Not on file   Additional Social History:   Sleep: good  Appetite:  Good  Current Medications: Current Facility-Administered Medications  Medication Dose Route Frequency Provider Last Rate Last Admin   alum & mag hydroxide-simeth (MAALOX/MYLANTA) 200-200-20 MG/5ML suspension 30 mL  30 mL Oral Q4H PRN Margorie John W, PA-C       B-complex with vitamin C tablet 1  tablet  1 tablet Oral Daily Monque Haggar, Jackie Plum, MD   1 tablet at 10/15/20 1014   gabapentin (NEURONTIN) capsule 400 mg  400 mg Oral TID Maida Sale, MD   400 mg at 10/15/20 1653   hydrOXYzine (ATARAX/VISTARIL) tablet 25 mg  25 mg Oral Q6H PRN Maida Sale, MD   25 mg at 10/15/20 1653   magnesium hydroxide (MILK OF MAGNESIA) suspension 30 mL  30 mL Oral Daily PRN Margorie John W, PA-C       mirtazapine (REMERON) tablet 30 mg  30 mg Oral QHS Armando Reichert, MD   30 mg at 10/15/20 2106   multivitamin with minerals tablet 1 tablet  1 tablet Oral Daily Prescilla Sours, PA-C   1 tablet at 10/15/20 1014   nicotine (NICODERM CQ - dosed in mg/24 hr) patch 7 mg  7 mg Transdermal Daily Nelda Marseille, Amy E, MD   7 mg at 10/15/20 1013   omega-3 acid ethyl esters (LOVAZA) capsule 1 g  1 g Oral BID Maida Sale, MD   1 g at 10/15/20 1653   pantoprazole (PROTONIX) EC tablet 40 mg  40 mg Oral Daily Nelda Marseille, Amy E, MD   40 mg at 10/15/20 1014   sucralfate (CARAFATE) 1 GM/10ML suspension 1 g  1 g Oral TID WC & HS Armando Reichert, MD   1 g at 10/15/20 2106    Lab Results:  No results found for this or any previous visit (from the past 68 hour(s)).   Blood Alcohol level:  Lab Results  Component  Value Date   ETH 270 (H) 10/06/2020   ETH 188 (H) 60/10/9321    Metabolic Disorder Labs: Lab Results  Component Value Date   HGBA1C 4.3 (L) 10/08/2020   MPG 76.71 10/08/2020   MPG 85.32 05/19/2020   No results found for: PROLACTIN Lab Results  Component Value Date   CHOL 122 10/08/2020   TRIG 62 10/08/2020   HDL 61 10/08/2020   CHOLHDL 2.0 10/08/2020   VLDL 12 10/08/2020   LDLCALC 49 10/08/2020    Physical Findings: AIMS: Facial and Oral Movements Muscles of Facial Expression: None, normal Lips and Perioral Area: None, normal Jaw: None, normal Tongue: None, normal,Extremity Movements Upper (arms, wrists, hands, fingers): None, normal Lower (legs, knees, ankles, toes): None, normal, Trunk Movements Neck, shoulders, hips: None, normal, Overall Severity Severity of abnormal movements (highest score from questions above): None, normal Incapacitation due to abnormal movements: None, normal Patient's awareness of abnormal movements (rate only patient's report): No Awareness, Dental Status Current problems with teeth and/or dentures?: Yes Does patient usually wear dentures?: No  CIWA:  CIWA-Ar Total: 3   Musculoskeletal: Strength & Muscle Tone: normal Gait & Station:  normal Patient leans: N/A  Psychiatric Specialty Exam:  Presentation  General Appearance: Appropriate for environment.  Eye Contact:Fair  Speech:Clear and Coherent  Speech Volume: Normal  Handedness:Right   Mood and Affect  Mood:dysphoric   Affect:Blunt   Thought Process  Thought Processes:Coherent  Descriptions of Associations:Intact  Orientation:Full (Time, Place and Person)  Thought Content:Logical  - no evidence of delusions, paranoia or psychosis on exam  History of Schizophrenia/Schizoaffective disorder:No data recorded Duration of Psychotic Symptoms:No data recorded Hallucinations:Hallucinations: None  Ideas of Reference:None  Suicidal Thoughts:Suicidal Thoughts:  No  Homicidal Thoughts:Homicidal Thoughts: No   Sensorium  Memory:Immediate Fair; Recent Fair; Remote Fair  Judgment:Improving   Insight:Fair   Executive Functions  Concentration:Good  Attention Span:Good  [place] of [place]  Language:Good   Psychomotor Activity  Psychomotor Activity:Psychomotor Activity: Normal   Assets  Assets:Communication Skills; Desire for Improvement; Financial Resources/Insurance; Resilience   Sleep  Sleep:Sleep: Good Number of Hours of Sleep: 6.75    Physical Exam: Physical Exam Vitals and nursing note reviewed.  Constitutional:      General: He is not in acute distress.    Appearance: Normal appearance. He is not ill-appearing, toxic-appearing or diaphoretic.  HENT:     Head: Normocephalic and atraumatic.     Mouth/Throat:     Comments: Poor dentition Pulmonary:     Effort: Pulmonary effort is normal.  Neurological:     General: No focal deficit present.     Mental Status: He is alert.   Review of Systems  Constitutional:  Negative for chills and diaphoresis.  Respiratory:  Negative for shortness of breath.   Cardiovascular:  Negative for chest pain.  Gastrointestinal:  Negative for abdominal pain, constipation, diarrhea, heartburn, nausea and vomiting.  Genitourinary: Negative.   Neurological:  Negative for dizziness, tremors and headaches.  Psychiatric/Behavioral:  Positive for depression. Negative for hallucinations and suicidal ideas. The patient is nervous/anxious.   Blood pressure 124/80, pulse (!) 58, temperature (!) 97.5 F (36.4 C), temperature source Oral, resp. rate 20, height 6\' 3"  (1.905 m), weight 90.7 kg, SpO2 100 %. Body mass index is 25 kg/m.   Treatment Plan Summary: Daily contact with patient to assess and evaluate symptoms and progress in treatment  Alcohol induced depressive d/o (r/o MDD) -Increase Remeron to 30mg  PO daily -Start Seroquel 25 mg nightly to help with depression and  sleep. -monitor vegetative symptoms.    Alcohol use d/o - severe dependence  Last CIWA 0. -Patient completed Ativan taper. -Continue B vitamin replacement, multivitamin -Continue gabapentin 400 mg 3 times daily for alcohol craving, anxiety, and potential seizures - SW to assist with referrals to [facility] and patient counseled on the need to abstain from alcohol use.  Patient declines residential rehab referral. - Continue Protonix 40mg  daily for GI protection   Transaminitis - 9/21 - AST 85 and ALT 74 (trending down) -Alkaline phosphatase 133 - PT/INR 12.3/0.9, PTT 28 -Hep A, hep C, hep B nonreactive on 4/28 - will need outpatient f/u after discharge  H/o high TSH On 4/28 -TSH 5.479, T4 low at 0.59, T3 uptake ratio high at 45. On 9/17 TSH 4.046 - patient currently off Synthroid and advised to f/u with PCP for monitoring after discharge  Thrombocytopenia Platelets 100 on 9/18 and on 9/17 - Appears chronic; will need outpatient f/u after discharge  ANA positive ANA positive for ribonucleoprotein 6.8.  Will recommend to follow-up with PCP at discharge.  H/o withdrawal seizures Patient had withdrawal seizure 2 years ago, no other seizures outside of alcohol use.  Patient has not taken Keppra since June - Continue Neurontin 400 mg TID. - Seizure precautions.      Nicotine dependence -Nicotine Patch 7 mg daily  Asymptomatic bradycardia -Pulse rate chronically low in 40's and 50's. Most recent 52/min -medicine was consulted initially. Recommended to call cardiology if pulse rate goes in the 30s or patient complains of any symptoms.   Off-and-on stabbing chest pain -No chest pain  today -.Repeat EKG on 9/21 shows marked sinus bradycardia with pulse rate 42/min.  No ST wave changes.  Pulse rate chronically low. -Monitor vitals and symptoms. -Continue Carafate 1 g oral 3 times daily. -Continue Protonix 40 mg daily.   PRN's -Continue Milk of Magnesia 30 ml PRN Daily  for  Constipation. -Continue Maalox/Mylanta 30 ml Q4H PRN for Indigestion. -Continue hydroxyzine 25 mg TID PRN for Anxiety. -Continue trazodone 50 mg QHS PRN for sleep.  Armando Reichert, MD, PGY 2 10/16/2020, 7:57 AM

## 2020-10-16 NOTE — BHH Group Notes (Signed)
Adult Psychoeducational Group Note  Date:  10/16/2020 Time:  3:03 PM  Group Topic/Focus:  Coping With Mental Health Crisis:   The purpose of this group is to help patients identify strategies for coping with mental health crisis.  Group discusses possible causes of crisis and ways to manage them effectively.  Participation Level:  Active  Participation Quality:  Appropriate  Affect:  Appropriate  Cognitive:  Alert  Insight: Appropriate  Engagement in Group:  Engaged  Modes of Intervention:  Discussion  Additional Comments:  This Psychoeducational group was facilitated by [employer]. This group focused on personal boundaries, rigid  boundaries, porous boundaries and healthy boundaries. All patients listened and shared what boundaries mean to them. Pts also identified coping skills and managing through crisis.  Huel Cote 10/16/2020, 3:03 PM

## 2020-10-16 NOTE — Progress Notes (Addendum)
D: Patient is pleasant. He has been sleeping the majority of the morning. He states, "I didn't sleep well last night. That's why I've been sleeping." He does not appear to be in any physical distress and denies any withdrawal symptoms. His CIWA scores have been low. He denies any SI/HI/AVH. Patient did not attend any groups this morning. He did attend Recreational Group at 1500.   A: Continue to monitor medication management and MD orders.  Safety checks completed every 15 minutes per protocol.  Offer support and encouragement as needed.  R: Patient is receptive to staff; his behavior is appropriate.     10/16/20 0900  Psych Admission Type (Psych Patients Only)  Admission Status Voluntary  Psychosocial Assessment  Patient Complaints Anxiety;Depression  Eye Contact Avoids  Facial Expression Other (Comment) (asleep)  Affect Appropriate to circumstance  Speech UTA  Interaction Other (Comment) (asleep; refused medications)  Motor Activity Other (Comment) (asleep)  Appearance/Hygiene Unremarkable  Behavior Characteristics Other (Comment) (asleep)  Mood Other (Comment) (asleep)  Thought Process  Coherency Unable to assess  Content UTA  Delusions UTA  Perception UTA  Hallucination UTA  Judgment UTA  Confusion UTA  Danger to Self  Current suicidal ideation? Denies  Danger to Others  Danger to Others None reported or observed

## 2020-10-16 NOTE — BHH Group Notes (Signed)
Pt did not attend orientation/goals group. 

## 2020-10-16 NOTE — Progress Notes (Signed)
Patient ID: Brandon Miller, male   DOB: Jun 11, 1965, 55 y.o.   MRN: [id number] Patient refused to come up for medications this morning. He was asked twice. No response from patient.

## 2020-10-16 NOTE — Group Note (Signed)
[facility] LCSW Group Therapy Note  Date/Time:  10/16/2020 10:00-11:00AM  Type of Therapy and Topic:  Group Therapy:  Healthy and Unhealthy Supports   Participation Level:  Did not attend  Description of Group: Patients in this group were invited to identify the differences between healthy and unhealthy supports and then to identify those people in their lives who fall into one category or the other, as well as why.  They were then introduced to the idea of adding more healthy supports and decreasing the unhealthy ones.  Patients discussed what additional healthy supports could be helpful in their recovery and wellness after discharge in order to maintain stability.   An emphasis was placed on using counselor, doctor, therapy groups, 12-step groups, and problem-specific support groups to expand supports.  At the end of group, a song "Love Myself" was played to encourage full participation in their own recovery journey.  Therapeutic Goals:   1)  discuss importance of adding supports to stay well once out of the hospital  2)  compare healthy versus unhealthy supports and identify some examples of each  3)  generate ideas and descriptions of healthy supports that can be added  4)  offer mutual support about how to address unhealthy supports  5)  encourage active participation in and adherence to discharge plan    Summary of Patient Progress:  The patient was invited to group, did not attend   Therapeutic Modalities:   Brief Solution-Focused Therapy  Selmer Dominion, LCSW

## 2020-10-17 ENCOUNTER — Encounter (HOSPITAL_COMMUNITY): Payer: Self-pay

## 2020-10-17 MED ORDER — BENZOCAINE 10 % MT GEL: Freq: Two times a day (BID) | OROMUCOSAL | 0 refills | Status: DC | PRN

## 2020-10-17 MED ORDER — B COMPLEX-C PO TABS: 1.0000 | ORAL_TABLET | Freq: Every day | ORAL | 0 refills | Status: DC

## 2020-10-17 MED ORDER — BENZOCAINE 10 % MT GEL
Freq: Two times a day (BID) | OROMUCOSAL | Status: DC | PRN
  Administered 2020-10-17: 1 via OROMUCOSAL
  Filled 2020-10-17: qty 9

## 2020-10-17 MED ORDER — OMEGA-3-ACID ETHYL ESTERS 1 G PO CAPS: 1.0000 g | ORAL_CAPSULE | Freq: Two times a day (BID) | ORAL | 0 refills | Status: DC

## 2020-10-17 MED ORDER — QUETIAPINE FUMARATE 25 MG PO TABS: 25.0000 mg | ORAL_TABLET | Freq: Every day | ORAL | 0 refills | Status: DC

## 2020-10-17 MED ORDER — GABAPENTIN 400 MG PO CAPS: 400.0000 mg | ORAL_CAPSULE | Freq: Three times a day (TID) | ORAL | 0 refills | Status: DC

## 2020-10-17 MED ORDER — NICOTINE 7 MG/24HR TD PT24: 7.0000 mg | MEDICATED_PATCH | Freq: Every day | TRANSDERMAL | 0 refills | Status: DC

## 2020-10-17 MED ORDER — MIRTAZAPINE 30 MG PO TABS: 30.0000 mg | ORAL_TABLET | Freq: Every day | ORAL | 0 refills | Status: AC

## 2020-10-17 NOTE — BHH Suicide Risk Assessment (Signed)
[facility] Discharge Suicide Risk Assessment   Principal Problem: Alcohol-induced mood disorder with depressive symptoms ([place]) Discharge Diagnoses: Principal Problem:   Alcohol-induced mood disorder with depressive symptoms ([place]) Active Problems:   Diabetes mellitus, type 2 (HCC)   Elevated LFTs   Alcohol dependence ([place])   GERD (gastroesophageal reflux disease)   History of seizures   Cognitive complaints   Total Time spent with patient: 20 minutes  Musculoskeletal: Strength & Muscle Tone: within normal limits Gait & Station: normal Patient leans: N/A  Psychiatric Specialty Exam  Presentation  General Appearance: Appropriate for Environment  Eye Contact:Fair  Speech:Clear and Coherent  Speech Volume:Decreased  Handedness:Right   Mood and Affect  Mood:Anxious; Dysphoric  Duration of Depression Symptoms: Greater than two weeks  Affect:Blunt   Thought Process  Thought Processes:Coherent  Descriptions of Associations:Intact  Orientation:Full (Time, Place and Person)  Thought Content:Logical; WDL  History of Schizophrenia/Schizoaffective disorder:No data recorded Duration of Psychotic Symptoms:No data recorded Hallucinations:Hallucinations: None  Ideas of Reference:None  Suicidal Thoughts:Suicidal Thoughts: No  Homicidal Thoughts:Homicidal Thoughts: No   Sensorium  Memory:Immediate Fair; Recent Fair; Remote Fair  Judgment:Poor  Insight:Fair   Executive Functions  Concentration:Good  Attention Span:Good  [place] of Knowledge:Good  Language:Good   Psychomotor Activity  Psychomotor Activity:Psychomotor Activity: Normal   Assets  Assets:Communication Skills; Desire for Improvement; Financial Resources/Insurance; Resilience   Sleep  Sleep:Sleep: Good Number of Hours of Sleep: 6.5   Physical Exam: Physical Exam Vitals and nursing note reviewed.  Constitutional:      Appearance: Normal appearance.  HENT:     Head:  Normocephalic and atraumatic.     Nose: Nose normal.  Eyes:     Extraocular Movements: Extraocular movements intact.  Pulmonary:     Effort: Pulmonary effort is normal.  Musculoskeletal:        General: Normal range of motion.     Cervical back: Normal range of motion.  Neurological:     General: No focal deficit present.     Mental Status: He is alert and oriented to person, place, and time.  Psychiatric:        Attention and Perception: Attention and perception normal.        Mood and Affect: Mood normal.        Behavior: Behavior normal.        Thought Content: Thought content normal.        Cognition and Memory: Cognition and memory normal.        Judgment: Judgment normal.   ROS Blood pressure 106/66, pulse (!) 51, temperature (!) 97.5 F (36.4 C), temperature source Oral, resp. rate 20, height 6\' 3"  (1.905 m), weight 90.7 kg, SpO2 97 %. Body mass index is 25 kg/m.  Mental Status Per Nursing Assessment::   On Admission:  Suicidal ideation indicated by patient  Demographic Factors:  Male, Caucasian, Low socioeconomic status, and Unemployed  Loss Factors: Financial problems/change in socioeconomic status  Historical Factors: Impulsivity  Risk Reduction Factors:   Living with another person, especially a relative and Positive social support  Continued Clinical Symptoms:  Depression:   Comorbid alcohol abuse/dependence Alcohol/Substance Abuse/Dependencies  Cognitive Features That Contribute To Risk:  Closed-mindedness    Suicide Risk:  Mild:  Suicidal ideation of limited frequency, intensity, duration, and specificity.  There are no identifiable plans, no associated intent, mild dysphoria and related symptoms, good self-control (both objective and subjective assessment), few other risk factors, and identifiable protective factors, including available and accessible social support.  [place], [facility]. Schedule an appointment as soon as  possible for a visit.   Specialty: Behavioral Health Why: A referral has been made to this provider on your behalf.  Per provider's request, please call personally to schedule an appointment for substance abuse intentsive outpatient therapy (SAIOP) and also medication management services. Contact information: [place] [address] [phone]         [place], [place] At. Go on 10/20/2020.   Specialty: Family Medicine Why: You have an appointment with your primary care provider for medication management services on 10/20/20 at 1:00 pm.  This appointment will be held in person. Contact information: [address] [address] [phone]                 Plan Of Care/Follow-up recommendations:  Activity:  as tolerated Diet:  cardiac Other:  follow up with primary care for further evaluation of medical issues. Attend all follow up appointments. Take medications as prescribed. In case of emergency, call 911 or contact emergency services.   Follow up:  [facility] [place] Steps: Schedule an appointment as soon as possible for a visitAppointment: Instructions: A referral has been made to this provider on your behalf.  Per provider's request, please call personally to schedule an appointment for substance abuse intentsive outpatient therapy (SAIOP) and also medication management services.   [place], [place], [facility] AtPCP - GeneralFamily [phone] [place] [id number] Steps: Go on 9/29/2022Appointment: Instructions: You have an appointment with your primary care provider for medication management services on 10/20/20 at 1:00 pm.  This appointment will be held in person.    Maida Sale, MD 10/17/2020,  12:10 PM

## 2020-10-17 NOTE — Group Note (Signed)
LCSW Group Therapy Note   Group Date: 10/17/2020 Start Time: 1300 End Time: 1400   Type of Therapy and Topic:  Group Therapy: Reframing Thoughts  Due to acuity on the unit, Social work met with patients individually and provided time for patients to ask questions.  Patient provided with packet and education about the [facility]   Jasa Dundon, LCSW, LCAS Clincal Social Worker  [facility]  
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
## 2020-10-17 NOTE — Progress Notes (Signed)
  [facility] Adult Case Management Discharge Plan :  Will you be returning to the same living situation after discharge:  Yes,  [place] At discharge, do you have transportation home?: Yes,  Sister Do you have the ability to pay for your medications: Yes,  Insurance   Release of information consent forms completed and in the chart;  Patient's signature needed at discharge.  Patient to Follow up at:  [place], [facility]. Schedule an appointment as soon as possible for a visit.   Specialty: Behavioral Health Why: A referral has been made to this provider on your behalf.  Per provider's request, please call personally to schedule an appointment for substance abuse intentsive outpatient therapy (SAIOP) and also medication management services. Contact information: [place] [address] [phone]         [place], [place] At. Go on 10/20/2020.   Specialty: Family Medicine Why: You have an appointment with your primary care provider for medication management services on 10/20/20 at 1:00 pm.  This appointment will be held in person. Contact information: [address] [place] [address] [phone]                 Next level of care provider has access to [place] and Suicide Prevention discussed: Yes,  Sister     Has patient been referred to the Quitline?: Patient refused referral  Patient has been referred for addiction treatment: Yes  Darleen Crocker, [place] 10/17/2020, 11:39 AM

## 2020-10-17 NOTE — Group Note (Signed)
Recreation Therapy Group Note   Group Topic:Team Building  Group Date: 10/17/2020 Start Time: 1000 End Time: 4196 Facilitators: Victorino Sparrow, LRT/CTRS Location: [address] Dayroom   Goal Area(s) Addresses:  Patient will effectively work with peer towards shared goal.  Patient will identify skills used to make activity successful.  Patient will share challenges and verbalize solution-driven approaches used. Patient will identify how skills used during activity can be used to reach post d/c goals.   Group Description: [employer]. Patients were provided the following materials: 2 drinking straws, 5 rubber bands, 5 paper clips, 2 index cards and 2 drinking cups. Using the provided materials patients were asked to build a launching mechanism to launch a ping pong ball across the room, approximately 10 feet. Patients were divided into teams of 3-5. Instructions required all materials be incorporated into the device, functionality of items left to the peer group's discretion.   Affect/Mood: N/A   Participation Level: Did not attend    Clinical Observations/Individualized Feedback: Pt did not attend group session.    Plan: Continue to engage patient in RT group sessions 2-3x/week.   Victorino Sparrow, LRT/CTRS 10/17/2020 12:42 PM

## 2020-10-17 NOTE — Progress Notes (Signed)
D: Pt A & O X 4. Denies SI, HI, AVH and pain at this time. D/C home as ordered. Picked up in lobby by "My sister". A: D/C instructions reviewed with pt including prescriptions, and follow up appointments; compliance encouraged. All belongings from locker 29 returned to pt at time of departure. Scheduled medications given with verbal education and effects monitored. Safety checks maintained without incident till time of d/c.  R: Pt receptive to care. Compliant with medications when offered. Denies adverse drug reactions when assessed. Verbalized understanding related to d/c instructions. Signed belonging sheet in agreement with items received from locker. Ambulatory with a steady gait. Appears to be in no physical distress at time of departure.

## 2020-10-17 NOTE — BH IP Treatment Plan (Unsigned)
Interdisciplinary Treatment and Diagnostic Plan Update  10/17/2020 Time of Session: 10:10am Brandon Miller MRN: [id number]  Principal Diagnosis: Alcohol-induced mood disorder with depressive symptoms ([place])  Secondary Diagnoses: Principal Problem:   Alcohol-induced mood disorder with depressive symptoms ([place]) Active Problems:   Diabetes mellitus, type 2 (HCC)   Elevated LFTs   Alcohol dependence ([place])   GERD (gastroesophageal reflux disease)   History of seizures   Cognitive complaints   Current Medications:  Current Facility-Administered Medications  Medication Dose Route Frequency Provider Last Rate Last Admin   alum & mag hydroxide-simeth (MAALOX/MYLANTA) 200-200-20 MG/5ML suspension 30 mL  30 mL Oral Q4H PRN Margorie John W, PA-C       B-complex with vitamin C tablet 1 tablet  1 tablet Oral Daily Hill, Jackie Plum, MD   1 tablet at 10/17/20 0821   benzocaine (ORAJEL) 10 % mucosal gel   Mouth/Throat BID PRN Bobbitt, Hessie Diener E, NP   1 application at 44/01/02 0125   gabapentin (NEURONTIN) capsule 400 mg  400 mg Oral TID Maida Sale, MD   400 mg at 10/17/20 7253   magnesium hydroxide (MILK OF MAGNESIA) suspension 30 mL  30 mL Oral Daily PRN Margorie John W, PA-C       mirtazapine (REMERON) tablet 30 mg  30 mg Oral QHS Armando Reichert, MD   30 mg at 10/16/20 2205   multivitamin with minerals tablet 1 tablet  1 tablet Oral Daily Prescilla Sours, PA-C   1 tablet at 10/17/20 6644   nicotine (NICODERM CQ - dosed in mg/24 hr) patch 7 mg  7 mg Transdermal Daily Nelda Marseille, Amy E, MD   7 mg at 10/17/20 0347   omega-3 acid ethyl esters (LOVAZA) capsule 1 g  1 g Oral BID Maida Sale, MD   1 g at 10/17/20 0821   pantoprazole (PROTONIX) EC tablet 40 mg  40 mg Oral Daily Viann Fish E, MD   40 mg at 10/17/20 4259   QUEtiapine (SEROQUEL) tablet 25 mg  25 mg Oral QHS Doda, Edd Arbour, MD   25 mg at 10/16/20 2205   sucralfate (CARAFATE) 1 GM/10ML suspension 1 g  1 g Oral TID WC &  HS Doda, Vandana, MD   1 g at 10/17/20 5638   PTA Medications: Medications Prior to Admission  Medication Sig Dispense Refill Last Dose   FLUoxetine (PROZAC) 10 MG tablet Take 10 mg by mouth daily.      amphetamine-dextroamphetamine (ADDERALL) 20 MG tablet Take 20 mg by mouth 2 (two) times daily.      ibuprofen (ADVIL) 200 MG tablet Take 400 mg by mouth every 6 (six) hours as needed for headache or moderate pain.      levETIRAcetam (KEPPRA) 500 MG tablet Take 1 tablet (500 mg total) by mouth 2 (two) times daily. (Patient not taking: No sig reported) 60 tablet 0    levothyroxine (SYNTHROID) 25 MCG tablet Take 1 tablet (25 mcg total) by mouth daily at 6 (six) AM. (Patient not taking: No sig reported) 30 tablet 0    Multiple Vitamin (MULTIVITAMIN WITH MINERALS) TABS tablet Take 1 tablet by mouth daily. (Patient not taking: No sig reported)      Naphazoline HCl (CLEAR EYES OP) Apply 1 drop to eye daily as needed (dry red eyes).      traZODone (DESYREL) 100 MG tablet Take 1 tablet (100 mg total) by mouth at bedtime as needed for sleep. (Patient taking differently: Take 100 mg by mouth at bedtime.)  30 tablet 0     Patient Stressors: Financial difficulties   Health problems   Occupational concerns   Substance abuse    Patient Strengths: Capable of independent living  Motivation for treatment/growth   Treatment Modalities: Medication Management, Group therapy, Case management,  1 to 1 session with clinician, Psychoeducation, Recreational therapy.   Physician Treatment Plan for Primary Diagnosis: Alcohol-induced mood disorder with depressive symptoms ([place]) Long Term Goal(s): Improvement in symptoms so as ready for discharge   Short Term Goals: Ability to identify changes in lifestyle to reduce recurrence of condition will improve Ability to verbalize feelings will improve Ability to disclose and discuss suicidal ideas Ability to demonstrate self-control will improve Ability to identify and  develop effective coping behaviors will improve Ability to maintain clinical measurements within normal limits will improve Compliance with prescribed medications will improve Ability to identify triggers associated with substance abuse/mental health issues will improve  Medication Management: Evaluate patient's response, side effects, and tolerance of medication regimen.  Therapeutic Interventions: 1 to 1 sessions, Unit Group sessions and Medication administration.  Evaluation of Outcomes: Adequate for Discharge  Physician Treatment Plan for Secondary Diagnosis: Principal Problem:   Alcohol-induced mood disorder with depressive symptoms (HCC) Active Problems:   Diabetes mellitus, type 2 (HCC)   Elevated LFTs   Alcohol dependence (HCC)   GERD (gastroesophageal reflux disease)   History of seizures   Cognitive complaints  Long Term Goal(s): Improvement in symptoms so as ready for discharge   Short Term Goals: Ability to identify changes in lifestyle to reduce recurrence of condition will improve Ability to verbalize feelings will improve Ability to disclose and discuss suicidal ideas Ability to demonstrate self-control will improve Ability to identify and develop effective coping behaviors will improve Ability to maintain clinical measurements within normal limits will improve Compliance with prescribed medications will improve Ability to identify triggers associated with substance abuse/mental health issues will improve     Medication Management: Evaluate patient's response, side effects, and tolerance of medication regimen.  Therapeutic Interventions: 1 to 1 sessions, Unit Group sessions and Medication administration.  Evaluation of Outcomes: Adequate for Discharge   RN Treatment Plan for Primary Diagnosis: Alcohol-induced mood disorder with depressive symptoms ([place]) Long Term Goal(s): Knowledge of disease and therapeutic regimen to maintain health will improve  Short Term  Goals: Ability to remain free from injury will improve, Ability to verbalize frustration and anger appropriately will improve, Ability to demonstrate self-control, Ability to identify and develop effective coping behaviors will improve, and Compliance with prescribed medications will improve  Medication Management: RN will administer medications as ordered by provider, will assess and evaluate patient's response and provide education to patient for prescribed medication. RN will report any adverse and/or side effects to prescribing provider.  Therapeutic Interventions: 1 on 1 counseling sessions, Psychoeducation, Medication administration, Evaluate responses to treatment, Monitor vital signs and CBGs as ordered, Perform/monitor CIWA, COWS, AIMS and Fall Risk screenings as ordered, Perform wound care treatments as ordered.  Evaluation of Outcomes: Adequate for Discharge   LCSW Treatment Plan for Primary Diagnosis: Alcohol-induced mood disorder with depressive symptoms ([place]) Long Term Goal(s): Safe transition to appropriate next level of care at discharge, Engage patient in therapeutic group addressing interpersonal concerns.  Short Term Goals: Engage patient in aftercare planning with referrals and resources  Therapeutic Interventions: Assess for all discharge needs, 1 to 1 time with Social worker, Explore available resources and support systems, Assess for adequacy in community support network, Educate family and significant other(s)  on suicide prevention, Complete Psychosocial Assessment, Interpersonal group therapy.  Evaluation of Outcomes: Adequate for Discharge   Progress in Treatment: Attending groups: Yes. Participating in groups: Yes. Taking medication as prescribed: Yes. Toleration medication: Yes. Family/Significant other contact made: Yes, individual(s) contacted:  sister Patient understands diagnosis: {BHH OFVWA:67737} Discussing patient identified problems/goals with staff: {BHH  VGKKD:59470} Medical problems stabilized or resolved: {BHH ADULT:22608} Denies suicidal/homicidal ideation: {BHH ADULT:22608} Issues/concerns per patient self-inventory: {BHH ADULT:22608} Other: ***  New problem(s) identified: {BHH NEW PROBLEMS:22609}  New Short Term/Long Term Goal(s):  Patient Goals:    Discharge Plan or Barriers:   Reason for Continuation of Hospitalization: {BHH Reasons for continued hospitalization:22604}  Estimated Length of Stay:   Scribe for Treatment Team: Vassie Moselle, LCSW 10/17/2020 10:49 AM

## 2021-07-05 ENCOUNTER — Emergency Department (HOSPITAL_BASED_OUTPATIENT_CLINIC_OR_DEPARTMENT_OTHER)
Admission: EM | Admit: 2021-07-05 | Discharge: 2021-07-06 | Disposition: A | Discharge status: Home / Self Care | Payer: BC Managed Care – PPO | Attending: Emergency Medicine | Admitting: Emergency Medicine

## 2021-07-05 ENCOUNTER — Encounter (HOSPITAL_BASED_OUTPATIENT_CLINIC_OR_DEPARTMENT_OTHER): Payer: Self-pay

## 2021-07-05 DIAGNOSIS — R519 Headache, unspecified: Secondary | ICD-10-CM | POA: Insufficient documentation

## 2021-07-05 DIAGNOSIS — M791 Myalgia, unspecified site: Secondary | ICD-10-CM | POA: Insufficient documentation

## 2021-07-05 DIAGNOSIS — Z20822 Contact with and (suspected) exposure to covid-19: Secondary | ICD-10-CM | POA: Insufficient documentation

## 2021-07-05 DIAGNOSIS — H532 Diplopia: Secondary | ICD-10-CM | POA: Diagnosis not present

## 2021-07-05 DIAGNOSIS — H5711 Ocular pain, right eye: Secondary | ICD-10-CM | POA: Diagnosis not present

## 2021-07-05 DIAGNOSIS — R110 Nausea: Secondary | ICD-10-CM | POA: Diagnosis present

## 2021-07-05 LAB — CBC WITH DIFFERENTIAL/PLATELET
Abs Immature Granulocytes: 0.02 10*3/uL (ref 0.00–0.07)
Basophils Absolute: 0.1 10*3/uL (ref 0.0–0.1)
Basophils Relative: 1 %
Eosinophils Absolute: 0.3 10*3/uL (ref 0.0–0.5)
Eosinophils Relative: 4 %
HCT: 44.1 % (ref 39.0–52.0)
Hemoglobin: 15.7 g/dL (ref 13.0–17.0)
Immature Granulocytes: 0 %
Lymphocytes Relative: 43 %
Lymphs Abs: 2.8 10*3/uL (ref 0.7–4.0)
MCH: 31.1 pg (ref 26.0–34.0)
MCHC: 35.6 g/dL (ref 30.0–36.0)
MCV: 87.3 fL (ref 80.0–100.0)
Monocytes Absolute: 0.6 10*3/uL (ref 0.1–1.0)
Monocytes Relative: 9 %
Neutro Abs: 2.8 10*3/uL (ref 1.7–7.7)
Neutrophils Relative %: 43 %
Platelets: 149 10*3/uL — ABNORMAL LOW (ref 150–400)
RBC: 5.05 MIL/uL (ref 4.22–5.81)
RDW: 12.5 % (ref 11.5–15.5)
WBC: 6.6 10*3/uL (ref 4.0–10.5)
nRBC: 0 % (ref 0.0–0.2)

## 2021-07-05 LAB — BASIC METABOLIC PANEL
Anion gap: 13 (ref 5–15)
BUN: 13 mg/dL (ref 6–20)
CO2: 21 mmol/L — ABNORMAL LOW (ref 22–32)
Calcium: 9.1 mg/dL (ref 8.9–10.3)
Chloride: 102 mmol/L (ref 98–111)
Creatinine, Ser: 1.02 mg/dL (ref 0.61–1.24)
GFR, Estimated: >60 mL/min (ref >60)
Glucose, Bld: 87 mg/dL (ref 70–99)
Potassium: 3.9 mmol/L (ref 3.5–5.1)
Sodium: 136 mmol/L (ref 135–145)

## 2021-07-05 LAB — CBG MONITORING, ED: Glucose-Capillary: 97 mg/dL (ref 70–99)

## 2021-07-05 LAB — SEDIMENTATION RATE: Sed Rate: 1 mm/hr (ref 0–16)

## 2021-07-05 MED ORDER — DEXAMETHASONE SODIUM PHOSPHATE 10 MG/ML IJ SOLN
10.0000 mg | Freq: Once | INTRAMUSCULAR | Status: AC
Start: 2021-07-05 — End: 2021-07-05
  Administered 2021-07-05: 10 mg via INTRAVENOUS
  Filled 2021-07-05: qty 1

## 2021-07-05 MED ORDER — SODIUM CHLORIDE 0.9 % IV BOLUS
1000.0000 mL | Freq: Once | INTRAVENOUS | Status: AC
  Administered 2021-07-05: 1000 mL via INTRAVENOUS

## 2021-07-05 MED ORDER — DIPHENHYDRAMINE HCL 50 MG/ML IJ SOLN
25.0000 mg | Freq: Once | INTRAMUSCULAR | Status: AC
  Administered 2021-07-05: 25 mg via INTRAVENOUS
  Filled 2021-07-05: qty 1

## 2021-07-05 MED ORDER — TETRACAINE HCL 0.5 % OP SOLN
2.0000 [drp] | Freq: Once | OPHTHALMIC | Status: AC
  Administered 2021-07-05: 2 [drp] via OPHTHALMIC
  Filled 2021-07-05: qty 4

## 2021-07-05 MED ORDER — ACETAMINOPHEN 500 MG PO TABS
1000.0000 mg | ORAL_TABLET | Freq: Once | ORAL | Status: AC
  Administered 2021-07-05: 1000 mg via ORAL
  Filled 2021-07-05: qty 2

## 2021-07-05 MED ORDER — METOCLOPRAMIDE HCL 5 MG/ML IJ SOLN
10.0000 mg | Freq: Once | INTRAMUSCULAR | Status: AC
  Administered 2021-07-05: 10 mg via INTRAVENOUS
  Filled 2021-07-05: qty 2

## 2021-07-05 NOTE — ED Provider Notes (Signed)
[facility] EMERGENCY DEPT Provider Note   CSN: [id number] Arrival date & time: 07/05/21  1846     History  Chief Complaint  Patient presents with   Eye Problem    Brandon Miller is a 56 y.o. male presented to ED with complaint of eye pain, nausea.  Patient reports that he began having pain and "flecks and dots" in his vision of the right eye beginning on Saturday.  He subsequently developed a headache behind the right eye, associated with nausea, generalized malaise and feeling very unwell, for the past several days.  He says this is similar to an incident several years ago in the hospital, or he subsequently had a seizure.  He denies any history of eye surgeries, complex migraines, or autoimmune disease.  Medical record review shows the patient an unremarkable MRI of the brain performed in 2020, however he was admitted for alcohol withdrawal at that time.  He was also noted to have eye "twitching" and reported double vision at the time.  He had an EEG performed which was negative for epileptiform discharges.  HPI     Home Medications Prior to Admission medications   Medication Sig Start Date End Date Taking? Authorizing Provider  amphetamine-dextroamphetamine (ADDERALL) 20 MG tablet Take 20 mg by mouth 2 (two) times daily. 09/14/20   [provider]  B Complex-C (B-COMPLEX WITH VITAMIN C) tablet Take 1 tablet by mouth daily. 10/18/20   Armando Reichert, MD  benzocaine (ORAJEL) 10 % mucosal gel Use as directed in the mouth or throat 2 (two) times daily as needed for mouth pain (toothpain). 10/17/20 11/16/20  Armando Reichert, MD  gabapentin (NEURONTIN) 400 MG capsule Take 1 capsule (400 mg total) by mouth 3 (three) times daily. 10/17/20 11/16/20  Armando Reichert, MD  mirtazapine (REMERON) 30 MG tablet Take 1 tablet (30 mg total) by mouth at bedtime. 10/17/20 11/16/20  Armando Reichert, MD  Multiple Vitamin (MULTIVITAMIN WITH MINERALS) TABS tablet Take 1 tablet by mouth  daily. Patient not taking: No sig reported 05/18/20   Ival Bible, MD  Naphazoline HCl (CLEAR EYES OP) Apply 1 drop to eye daily as needed (dry red eyes).    [provider]  nicotine (NICODERM CQ - DOSED IN MG/24 HR) 7 mg/24hr patch Place 1 patch (7 mg total) onto the skin daily. 10/18/20   Armando Reichert, MD  omega-3 acid ethyl esters (LOVAZA) 1 g capsule Take 1 capsule (1 g total) by mouth 2 (two) times daily. 10/17/20   Armando Reichert, MD  QUEtiapine (SEROQUEL) 25 MG tablet Take 1 tablet (25 mg total) by mouth at bedtime. 10/17/20 11/16/20  Armando Reichert, MD      Allergies    Daypro [oxaprozin], Diclofenac sodium, Indocin [indomethacin], and Zanaflex [tizanidine hcl]    Review of Systems   Review of Systems  Physical Exam Updated Vital Signs BP 106/64   Pulse (!) 49   Temp 98 F (36.7 C) (Oral)   Resp 16   Ht 6' 2"  (1.88 m)   Wt 104.3 kg   SpO2 99%   BMI 29.53 kg/m  Physical Exam Constitutional:      General: He is not in acute distress. HENT:     Head: Normocephalic and atraumatic.  Eyes:     General:        Right eye: No discharge.        Left eye: No discharge.     Extraocular Movements: Extraocular movements intact.     Conjunctiva/sclera:  Conjunctivae normal.     Pupils: Pupils are equal, round, and reactive to light.     Comments: Visual fields are grossly intact, no steamy or hazy pupil or conjunctival injection  Cardiovascular:     Rate and Rhythm: Normal rate and regular rhythm.  Pulmonary:     Effort: Pulmonary effort is normal. No respiratory distress.  Abdominal:     General: There is no distension.     Tenderness: There is no abdominal tenderness.  Skin:    General: Skin is warm and dry.  Neurological:     General: No focal deficit present.     Mental Status: He is alert. Mental status is at baseline.  Psychiatric:        Mood and Affect: Mood normal.        Behavior: Behavior normal.     ED Results / Procedures / Treatments    Labs (all labs ordered are listed, but only abnormal results are displayed) Labs Reviewed  BASIC METABOLIC PANEL - Abnormal; Notable for the following components:      Result Value   CO2 21 (*)    All other components within normal limits  CBC WITH DIFFERENTIAL/PLATELET - Abnormal; Notable for the following components:   Platelets 149 (*)    All other components within normal limits  SEDIMENTATION RATE  C-REACTIVE PROTEIN  CBG MONITORING, ED    EKG EKG Interpretation  Date/Time:  Wednesday July 05 2021 18:53:52 EDT Ventricular Rate:  58 PR Interval:  154 QRS Duration: 92 QT Interval:  448 QTC Calculation: 439 R Axis:   59 Text Interpretation: Sinus bradycardia Otherwise normal ECG When compared with ECG of 12-Oct-2020 12:42, No significant change was found Confirmed by Octaviano Glow [phone]) on 07/05/2021 7:08:41 PM  Radiology No results found.  Procedures Procedures    Medications Ordered in ED Medications  dexamethasone (DECADRON) injection 10 mg (has no administration in time range)  metoCLOPramide (REGLAN) injection 10 mg (10 mg Intravenous Given 07/05/21 2003)  acetaminophen (TYLENOL) tablet 1,000 mg (1,000 mg Oral Given 07/05/21 2004)  sodium chloride 0.9 % bolus 1,000 mL (1,000 mLs Intravenous New Bag/Given 07/05/21 2009)  diphenhydrAMINE (BENADRYL) injection 25 mg (25 mg Intravenous Given 07/05/21 2004)  tetracaine (PONTOCAINE) 0.5 % ophthalmic solution 2 drop (2 drops Both Eyes Given 07/05/21 2009)    ED Course/ Medical Decision Making/ A&P Clinical Course as of 07/05/21 2159  Wed Jul 05, 2021  2115 Headache appears improved with the medications but still present, continues to have right-sided photophobia or sensitivity to light in the right eye.  Ocular pressure Tono-Pen was 25 mmhg, which is not significantly high to raise concern for acute angle-closure glaucoma at this time [MT]    Clinical Course User Index [MT] Decie Verne, Carola Rhine, MD                            Medical Decision Making Amount and/or Complexity of Data Reviewed Labs: ordered. Radiology: ordered.  Risk OTC drugs. Prescription drug management.   This patient presents to the ED with concern for blurred or double vision, pain behind his eye, headache, nausea. This involves an extensive number of treatment options, and is a complaint that carries with it a high risk of complications and morbidity.  The differential diagnosis includes complex migraine versus autoimmune disease including MS versus optic neuritis versus other  He has no fever or nuchal rigidity to suggest meningitis at this time.  I  do not believe he needs an emergent LP.  Ocular pressure wnl - doubt acute glaucoma Painful vision loss not consistent with CRAO or retinal detachment  He reports the same symptoms 2 years ago in Jan 2020, negative MRI, it was felt to be a complex migraine - he says it was a trigger for a seizure at that time, and worries this is an aura.    External records from outside source obtained and reviewed including 2020 MRI and hospitalization course  I ordered and personally interpreted labs.  The pertinent results include:  BMP, CBC, ESR, CRP - no sig findings   The patient was maintained on a cardiac monitor.  I personally viewed and interpreted the cardiac monitored which showed an underlying rhythm of: NSR  I ordered medication including IV migraine medications  Test Considered: this is not consistent with meningitis - did not feel LP clinically indicated  I spoke with the neurologist Dr Cheral Marker by phone as noted, he agreed with transfer for MRI imaging.  If MRI imaging is abnormal neurology can be consulted; otherwise patient could f/u outpatient with neuro for complex migraines.  Patient's sister to drive him by POV.        Final Clinical Impression(s) / ED Diagnoses Final diagnoses:  None    Rx / DC Orders ED Discharge Orders     None         Wyvonnia Dusky, MD 07/05/21 2159

## 2021-07-05 NOTE — Discharge Instructions (Addendum)
Your symptoms today are consistent with chronic headaches. There was no evidence of acute abnormality noted on the imaging of your brain. However, if your symptoms worsen or change in nature you should be reevaluated. Do not drive or operate heavy machinery for the next

## 2021-07-05 NOTE — ED Notes (Signed)
Visual acuity screening: right (affected) eye 20/50, left eye 20/30 without correction

## 2021-07-05 NOTE — ED Notes (Signed)
Patient going to [facility] emergency for MRI--Dr. Almyra Free accepting

## 2021-07-05 NOTE — ED Notes (Signed)
Pt to ED from [place].  Pt A&Ox4, NAD noted, pt c/o pain in right eye with dark vision and spots.  Pt to get MRI.

## 2021-07-05 NOTE — ED Triage Notes (Addendum)
Onset  Saturday off and on of blurry vision and seeing spots.  Became constant with waking this am  Having difficulty seeing.  Having headache behind right eye.  Associated with nausea.  States also having pain down right arm

## 2021-07-06 ENCOUNTER — Emergency Department (HOSPITAL_COMMUNITY): Payer: BC Managed Care – PPO

## 2021-07-06 LAB — C-REACTIVE PROTEIN: CRP: <0.5 mg/dL (ref <1.0)

## 2021-07-06 LAB — SARS CORONAVIRUS 2 BY RT PCR: SARS Coronavirus 2 by RT PCR: NEGATIVE

## 2021-07-06 MED ORDER — LORAZEPAM 2 MG/ML IJ SOLN
1.0000 mg | Freq: Once | INTRAMUSCULAR | Status: AC
  Administered 2021-07-06: 1 mg via INTRAVENOUS
  Filled 2021-07-06: qty 1

## 2021-07-06 MED ORDER — DIPHENHYDRAMINE HCL 50 MG/ML IJ SOLN
50.0000 mg | Freq: Once | INTRAMUSCULAR | Status: AC
  Administered 2021-07-06: 50 mg via INTRAVENOUS
  Filled 2021-07-06: qty 1

## 2021-07-06 MED ORDER — HYDROMORPHONE HCL 1 MG/ML IJ SOLN
1.0000 mg | Freq: Once | INTRAMUSCULAR | Status: AC
  Administered 2021-07-06: 1 mg via INTRAVENOUS
  Filled 2021-07-06: qty 1

## 2021-07-06 MED ORDER — GADOBUTROL 1 MMOL/ML IV SOLN
10.0000 mL | Freq: Once | INTRAVENOUS | Status: AC | PRN
  Administered 2021-07-06: 10 mL via INTRAVENOUS

## 2021-07-06 MED ORDER — SODIUM CHLORIDE 0.9 % IV SOLN
250.0000 mg | Freq: Once | INTRAVENOUS | Status: AC
  Administered 2021-07-06: 250 mg via INTRAVENOUS
  Filled 2021-07-06: qty 4

## 2021-07-06 MED ORDER — KETOROLAC TROMETHAMINE 30 MG/ML IJ SOLN
30.0000 mg | Freq: Once | INTRAMUSCULAR | Status: AC
  Administered 2021-07-06: 30 mg via INTRAVENOUS
  Filled 2021-07-06: qty 1

## 2021-07-06 MED ORDER — VALPROATE SODIUM 100 MG/ML IV SOLN
1000.0000 mg | INTRAVENOUS | Status: AC
  Administered 2021-07-06: 1000 mg via INTRAVENOUS
  Filled 2021-07-06: qty 10

## 2021-07-06 NOTE — ED Notes (Signed)
Reviewed discharge instructions with patient. Follow-up care reviewed. Patient verbalized understanding. Patient A&Ox4, VSS, and ambulatory with steady gait upon discharge.  

## 2021-07-06 NOTE — ED Notes (Signed)
RT at bedside setting up Hi-flow

## 2021-07-06 NOTE — ED Provider Notes (Signed)
[facility] EMERGENCY DEPARTMENT Provider Note   CSN: [id number] Arrival date & time: 07/05/21  1846     History  Chief Complaint  Patient presents with   Eye Problem    Brandon Miller is a 56 y.o. male.  HPI 56 year old male with a onset of right eye pain and headache that began on Saturday and worsened through yesterday.  He was seen at [place].  He was evaluated there with labs.  His care was discussed with neurology and patient came to [facility] for MRI of his brain and orbit.  This was done last night and patient has been waiting for evaluation.  He states that he would initially received some medications at [facility] and had some decrease in his pain.  However it is increases continues to be 9 out of 10.  He has some scotoma in his visual field on the right eye that move when he moves his eye.  They remain constant throughout his visual field.  He has not had any nasal discharge, fever, neck stiffness.  He has had some body aches.  He denies any alcohol or drug use.  He denies smoking.  He reports taking Adderall.  He reports a similar episode several years ago that did not have a definitive diagnosis. Dr. Cristela Felt note from [facility] was reviewed    Home Medications Prior to Admission medications   Medication Sig Start Date End Date Taking? Authorizing Provider  amphetamine-dextroamphetamine (ADDERALL) 20 MG tablet Take 20 mg by mouth 2 (two) times daily. 09/14/20   [provider]  B Complex-C (B-COMPLEX WITH VITAMIN C) tablet Take 1 tablet by mouth daily. 10/18/20   Armando Reichert, MD  benzocaine (ORAJEL) 10 % mucosal gel Use as directed in the mouth or throat 2 (two) times daily as needed for mouth pain (toothpain). 10/17/20 11/16/20  Armando Reichert, MD  gabapentin (NEURONTIN) 400 MG capsule Take 1 capsule (400 mg total) by mouth 3 (three) times daily. 10/17/20 11/16/20  Armando Reichert, MD  mirtazapine (REMERON) 30 MG tablet Take 1  tablet (30 mg total) by mouth at bedtime. 10/17/20 11/16/20  Armando Reichert, MD  Multiple Vitamin (MULTIVITAMIN WITH MINERALS) TABS tablet Take 1 tablet by mouth daily. Patient not taking: No sig reported 05/18/20   Ival Bible, MD  Naphazoline HCl (CLEAR EYES OP) Apply 1 drop to eye daily as needed (dry red eyes).    [provider]  nicotine (NICODERM CQ - DOSED IN MG/24 HR) 7 mg/24hr patch Place 1 patch (7 mg total) onto the skin daily. 10/18/20   Armando Reichert, MD  omega-3 acid ethyl esters (LOVAZA) 1 g capsule Take 1 capsule (1 g total) by mouth 2 (two) times daily. 10/17/20   Armando Reichert, MD  QUEtiapine (SEROQUEL) 25 MG tablet Take 1 tablet (25 mg total) by mouth at bedtime. 10/17/20 11/16/20  Armando Reichert, MD      Allergies    Daypro [oxaprozin], Diclofenac sodium, Indocin [indomethacin], and Zanaflex [tizanidine hcl]    Review of Systems   Review of Systems  Physical Exam Updated Vital Signs BP 119/74   Pulse 64   Temp 97.7 F (36.5 C) (Oral)   Resp (!) 21   Ht 1.88 m ('6\' 2"'$ )   Wt 104.3 kg   SpO2 95%   BMI 29.53 kg/m  Physical Exam Vitals and nursing note reviewed.  Constitutional:      Appearance: Normal appearance.  HENT:     Head:  Normocephalic.     Right Ear: External ear normal.     Left Ear: External ear normal.     Nose: Nose normal.     Mouth/Throat:     Pharynx: Oropharynx is clear.  Eyes:     Extraocular Movements: Extraocular movements intact.     Conjunctiva/sclera: Conjunctivae normal.     Pupils: Pupils are equal, round, and reactive to light.  Musculoskeletal:     Cervical back: Normal range of motion.  Skin:    General: Skin is warm.     Capillary Refill: Capillary refill takes less than 2 seconds.  Neurological:     General: No focal deficit present.     Mental Status: He is alert.     Cranial Nerves: No cranial nerve deficit.     Motor: No weakness.     Coordination: Coordination normal.     ED Results / Procedures /  Treatments   Labs (all labs ordered are listed, but only abnormal results are displayed) Labs Reviewed  BASIC METABOLIC PANEL - Abnormal; Notable for the following components:      Result Value   CO2 21 (*)    All other components within normal limits  CBC WITH DIFFERENTIAL/PLATELET - Abnormal; Notable for the following components:   Platelets 149 (*)    All other components within normal limits  SEDIMENTATION RATE  C-REACTIVE PROTEIN  CBG MONITORING, ED    EKG EKG Interpretation  Date/Time:  Wednesday July 05 2021 18:53:52 EDT Ventricular Rate:  58 PR Interval:  154 QRS Duration: 92 QT Interval:  448 QTC Calculation: 439 R Axis:   59 Text Interpretation: Sinus bradycardia Otherwise normal ECG When compared with ECG of 12-Oct-2020 12:42, No significant change was found Confirmed by Octaviano Glow [phone]) on 07/05/2021 7:08:41 PM  Radiology MR Brain W and Wo Contrast  Result Date: 07/06/2021 CLINICAL DATA:  Initial evaluation for headache, right ocular pain. EXAM: MRI HEAD AND ORBITS WITHOUT AND WITH CONTRAST TECHNIQUE: Multiplanar, multiecho pulse sequences of the brain and surrounding structures were obtained without and with intravenous contrast. Multiplanar, multiecho pulse sequences of the orbits and surrounding structures were obtained including fat saturation techniques, before and after intravenous contrast administration. CONTRAST:  72m GADAVIST GADOBUTROL 1 MMOL/ML IV SOLN COMPARISON:  Previous exam from 07/22/2019. FINDINGS: MRI HEAD FINDINGS Brain: Cerebral volume within normal limits. Few scattered subcentimeter foci of T2/FLAIR hyperintensity noted involving the periventricular, deep, and subcortical white matter of both frontal lobes, nonspecific, but overall mild in nature. No evidence for acute or subacute ischemia. Gray-white matter differentiation maintained. No areas of chronic cortical infarction or other insult. No foci of susceptibility artifact to suggest acute  or chronic intracranial hemorrhage. No mass lesion, midline shift or mass effect. No hydrocephalus or extra-axial fluid collection. Pituitary gland and suprasellar region normal. Midline structures intact and normally formed. No abnormal enhancement. Vascular: Major intracranial vascular flow voids are well maintained. Skull and upper cervical spine: Craniocervical junction within normal limits. Degenerative spondylosis noted at C3-4 with mild spinal stenosis. Bone marrow signal intensity within normal limits. No scalp soft tissue abnormality. Other: Trace fluid signal intensity noted within the mastoid air cells bilaterally, of doubtful significance. Inner ear structures grossly normal. MRI ORBITS FINDINGS Orbits: Globes are symmetric in size with normal appearance and morphology. Optic nerves fairly symmetric bilaterally. No intrinsic optic nerve edema or convincing enhancement to suggest acute optic neuritis. Note made of apparent small focus of perineural enhancement adjacent to the right optic nerve complex  on postcontrast coronal sequence (series 14, image 13), not definitely seen on corresponding sequences, and favored to be artifactual. Intraconal and extraconal fat maintained. Extra-ocular muscles symmetric and within normal limits. Lacrimal glands normal. No abnormality about the orbital apices or cavernous sinus. Superior orbital veins fairly symmetric and within normal limits. Optic chiasm normally situated within the suprasellar cistern. No abnormality about the optic radiations. Visualized sinuses: Scattered mucosal thickening noted about the ethmoidal air cells and maxillary sinuses. Visualized paranasal sinuses are otherwise clear. Soft tissues: Unremarkable. IMPRESSION: 1. Few scattered subcentimeter foci of T2/FLAIR hyperintensity involving the supratentorial cerebral white matter, nonspecific, but overall mild in nature. Primary differential considerations include changes of chronic microvascular  ischemic disease versus complicated migraines. Overall appearance would be atypical for demyelinating disease. No associated enhancement. 2. Otherwise normal MRI of the brain and orbits. No other acute intracranial abnormality. No evidence for acute optic neuritis. Electronically Signed   By: Jeannine Boga M.D.   On: 07/06/2021 02:56   MR ORBITS W WO CONTRAST  Result Date: 07/06/2021 CLINICAL DATA:  Initial evaluation for headache, right ocular pain. EXAM: MRI HEAD AND ORBITS WITHOUT AND WITH CONTRAST TECHNIQUE: Multiplanar, multiecho pulse sequences of the brain and surrounding structures were obtained without and with intravenous contrast. Multiplanar, multiecho pulse sequences of the orbits and surrounding structures were obtained including fat saturation techniques, before and after intravenous contrast administration. CONTRAST:  5m GADAVIST GADOBUTROL 1 MMOL/ML IV SOLN COMPARISON:  Previous exam from 07/22/2019. FINDINGS: MRI HEAD FINDINGS Brain: Cerebral volume within normal limits. Few scattered subcentimeter foci of T2/FLAIR hyperintensity noted involving the periventricular, deep, and subcortical white matter of both frontal lobes, nonspecific, but overall mild in nature. No evidence for acute or subacute ischemia. Gray-white matter differentiation maintained. No areas of chronic cortical infarction or other insult. No foci of susceptibility artifact to suggest acute or chronic intracranial hemorrhage. No mass lesion, midline shift or mass effect. No hydrocephalus or extra-axial fluid collection. Pituitary gland and suprasellar region normal. Midline structures intact and normally formed. No abnormal enhancement. Vascular: Major intracranial vascular flow voids are well maintained. Skull and upper cervical spine: Craniocervical junction within normal limits. Degenerative spondylosis noted at C3-4 with mild spinal stenosis. Bone marrow signal intensity within normal limits. No scalp soft tissue  abnormality. Other: Trace fluid signal intensity noted within the mastoid air cells bilaterally, of doubtful significance. Inner ear structures grossly normal. MRI ORBITS FINDINGS Orbits: Globes are symmetric in size with normal appearance and morphology. Optic nerves fairly symmetric bilaterally. No intrinsic optic nerve edema or convincing enhancement to suggest acute optic neuritis. Note made of apparent small focus of perineural enhancement adjacent to the right optic nerve complex on postcontrast coronal sequence (series 14, image 13), not definitely seen on corresponding sequences, and favored to be artifactual. Intraconal and extraconal fat maintained. Extra-ocular muscles symmetric and within normal limits. Lacrimal glands normal. No abnormality about the orbital apices or cavernous sinus. Superior orbital veins fairly symmetric and within normal limits. Optic chiasm normally situated within the suprasellar cistern. No abnormality about the optic radiations. Visualized sinuses: Scattered mucosal thickening noted about the ethmoidal air cells and maxillary sinuses. Visualized paranasal sinuses are otherwise clear. Soft tissues: Unremarkable. IMPRESSION: 1. Few scattered subcentimeter foci of T2/FLAIR hyperintensity involving the supratentorial cerebral white matter, nonspecific, but overall mild in nature. Primary differential considerations include changes of chronic microvascular ischemic disease versus complicated migraines. Overall appearance would be atypical for demyelinating disease. No associated enhancement. 2. Otherwise normal MRI of the  brain and orbits. No other acute intracranial abnormality. No evidence for acute optic neuritis. Electronically Signed   By: Jeannine Boga M.D.   On: 07/06/2021 02:56    Procedures Procedures    Medications Ordered in ED Medications  ketorolac (TORADOL) 30 MG/ML injection 30 mg (has no administration in time range)  HYDROmorphone (DILAUDID) injection  1 mg (has no administration in time range)  metoCLOPramide (REGLAN) injection 10 mg (10 mg Intravenous Given 07/05/21 2003)  acetaminophen (TYLENOL) tablet 1,000 mg (1,000 mg Oral Given 07/05/21 2004)  sodium chloride 0.9 % bolus 1,000 mL (0 mLs Intravenous Stopped 07/05/21 2109)  diphenhydrAMINE (BENADRYL) injection 25 mg (25 mg Intravenous Given 07/05/21 2004)  tetracaine (PONTOCAINE) 0.5 % ophthalmic solution 2 drop (2 drops Both Eyes Given 07/05/21 2009)  dexamethasone (DECADRON) injection 10 mg (10 mg Intravenous Given 07/05/21 2203)  LORazepam (ATIVAN) injection 1 mg (1 mg Intravenous Given 07/06/21 0034)  gadobutrol (GADAVIST) 1 MMOL/ML injection 10 mL (10 mLs Intravenous Contrast Given 07/06/21 0139)    ED Course/ Medical Decision Making/ A&P Clinical Course as of 07/06/21 0955  Wed Jul 05, 2021  2115 Headache appears improved with the medications but still present, continues to have right-sided photophobia or sensitivity to light in the right eye.  Ocular pressure Tono-Pen was 25 mmhg, which is not significantly high to raise concern for acute angle-closure glaucoma at this time [MT]  Thu Jul 06, 2021  0955 CBC reviewed and mild thrombocytopenia with platelets 1 49,000 [place] reviewed and CO2 was slightly decreased at 21 CBG monitoring with glucose at 97 Sed rate normal at 1 C-reactive protein is pending COVID test was obtained and is pending [DR]    Clinical Course User Index [DR] Pattricia Boss, MD [MT] Wyvonnia Dusky, MD                           Medical Decision Making Patient sent in from [facility] to have MRI of head and orbit. MRI results were reviewed Patient continues to have some eye pain.  Differential diagnosis includes, but is not limited to, migraine headache and cluster headaches, trigeminal neuralgia, temporal arteritis.  Doubt trigeminal neuralgia and temporal arteritis as physical findings and history not consistent.  MRI was reviewed and MRI findings  make optic neuritis, sinusitis, periorbital cellulitis and other structural and inflammatory etiologies much less likely.  Patient was given Decadron, Reglan, Ativan, acetaminophen at droppage. He reports that he has had an allergic reaction to Indocin.  He reports this is GI upset when taking the pills.  I have discussed that this does not likely represent any true allergy.  I discussed with him risk benefits and willingness to try Toradol IV.  He is willing to try this.  He has had Toradol, Dilaudid ordered here. I feel that this will most likely need to have symptomatic treatment as most consistent with migraine. Additionally he is placed on oxygen as this could represent a cluster headache  8:56 AM Dr. Theda Sers at bedside Patient with some relief with meds States he did drink some beers on Monday Will add covid test 9:56 AM Patient remains hemodynamically stable.  Pain is down to 5 out of 10. Patient appears stable for discharge Discussed return precautions and need for follow-up with patient and he requests work note is given 1 until Saturday given the amount of medications he has received.   Amount and/or Complexity of Data Reviewed Labs: ordered. Decision-making  details documented in ED Course. Radiology: ordered and independent interpretation performed. Decision-making details documented in ED Course. Discussion of management or test interpretation with external provider(s): Care discussed with Dr. Theda Sers, on-call for neurology. He will review MRI and call back  Risk OTC drugs. Prescription drug management. Parenteral controlled substances. Decision regarding hospitalization.          Final Clinical Impression(s) / ED Diagnoses Final diagnoses:  Acute right eye pain  Nonintractable headache, unspecified chronicity pattern, unspecified headache type    Rx / DC Orders ED Discharge Orders     None         Pattricia Boss, MD 07/06/21 [phone]

## 2021-07-06 NOTE — ED Notes (Signed)
Called RT for Hi-flow [place] set up

## 2021-08-25 IMAGING — CT CT ABD-PELV W/ CM
2 of 5 series · 16 of 46 positions shown, 18 images · IV contrast (omnipaque)
Comparison: None.

CLINICAL DATA: Acute abdominal pain, rash

EXAM:
CT ABDOMEN AND PELVIS WITH CONTRAST
TECHNIQUE: Multidetector CT imaging of the abdomen and pelvis was performed
using the standard protocol following bolus administration of
intravenous contrast.
CONTRAST:  100mL OMNIPAQUE IOHEXOL 300 MG/ML  SOLN

[Series 2: axial st · axial · 0.88mm/px · z∈[-580,-160]mm · 13 of 97 slices shown, 15 images]
[im 7/97  soft-tissue]
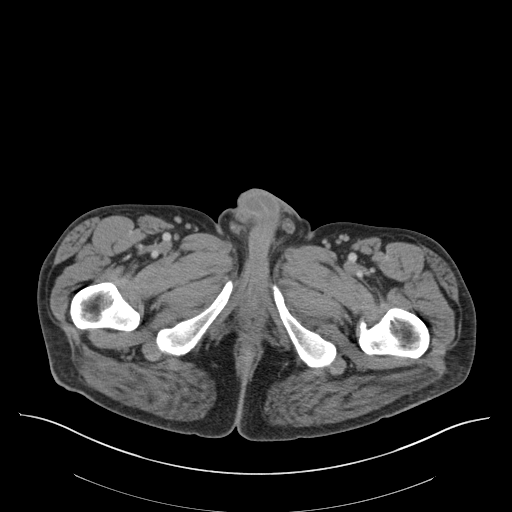
[im 7/97  bone]
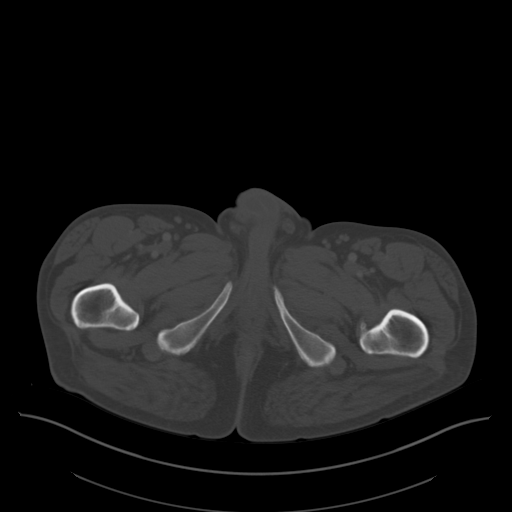
[im 13/97  soft-tissue]
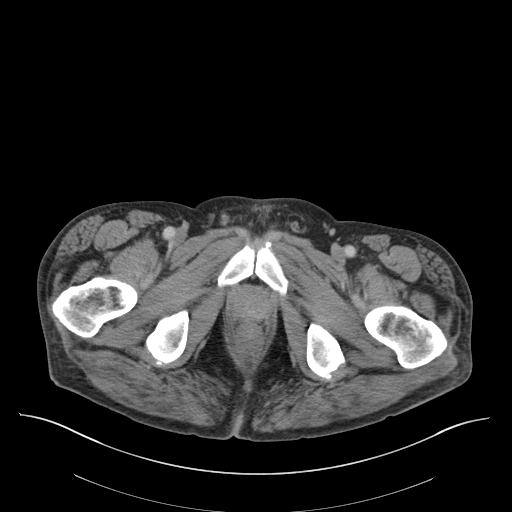
[im 19/97  soft-tissue]
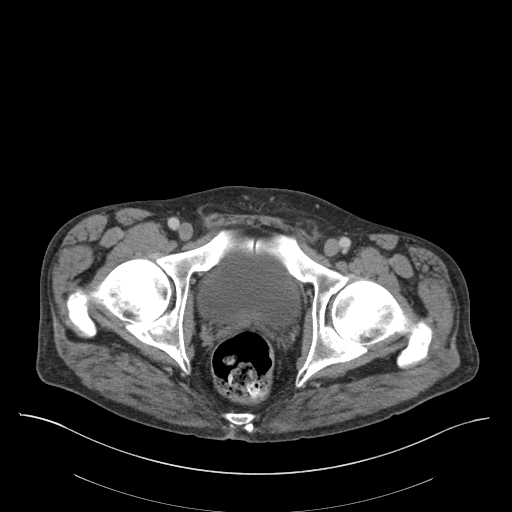
[im 31/97  soft-tissue]
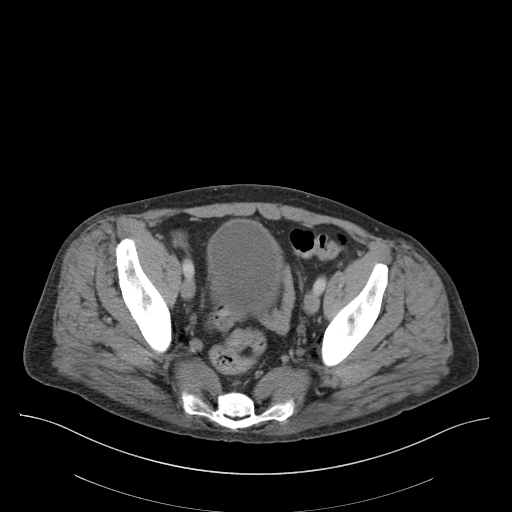
[im 37/97  soft-tissue]
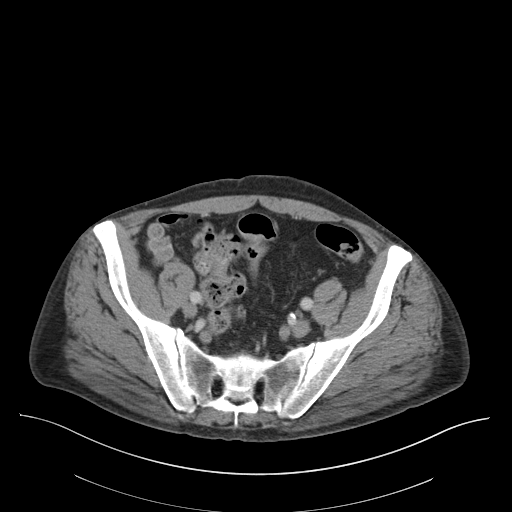
[im 43/97  soft-tissue]
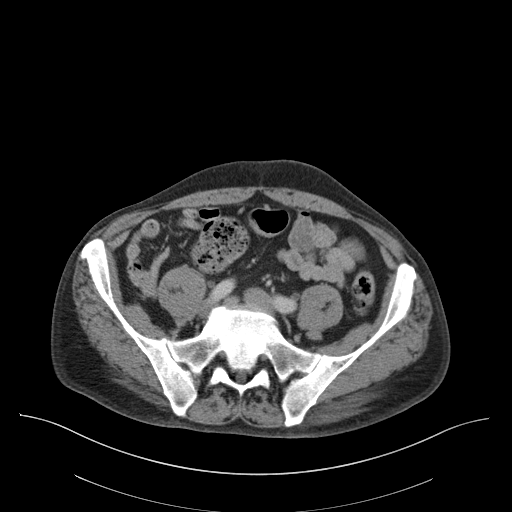
[im 49/97  soft-tissue]
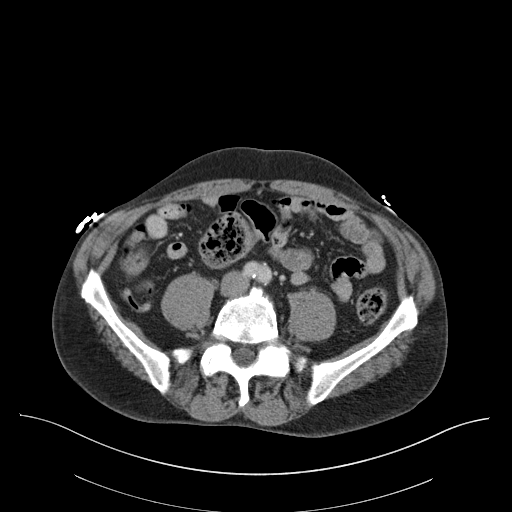
[im 55/97  soft-tissue]
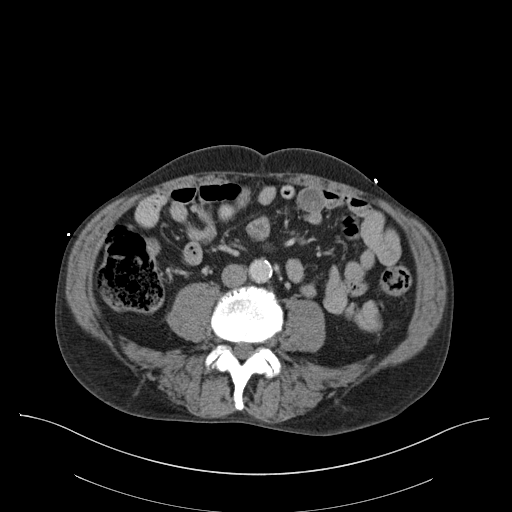
[im 61/97  soft-tissue]
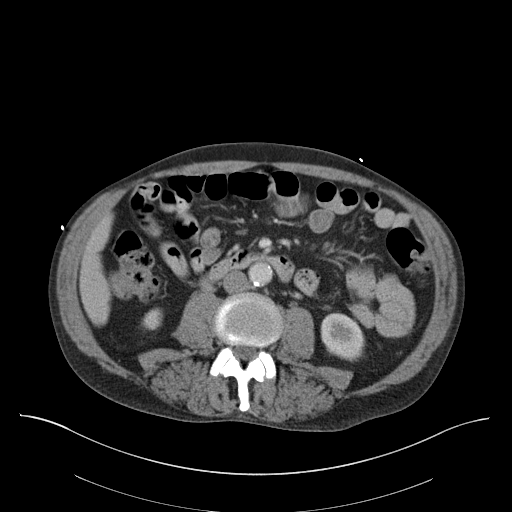
[im 61/97  bone]
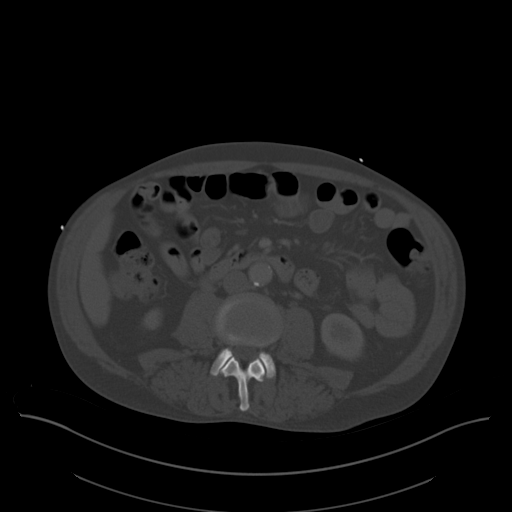
[im 67/97  soft-tissue]
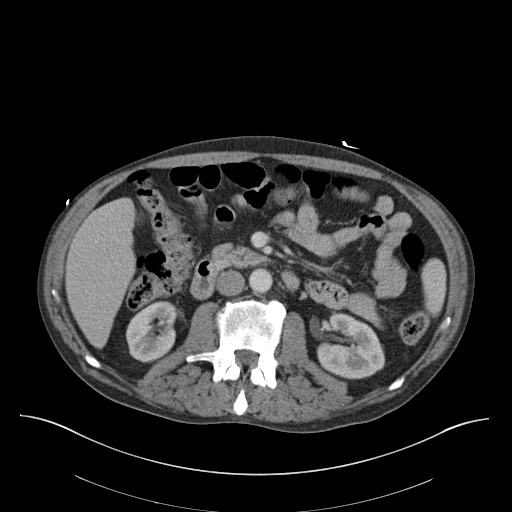
[im 79/97  soft-tissue]
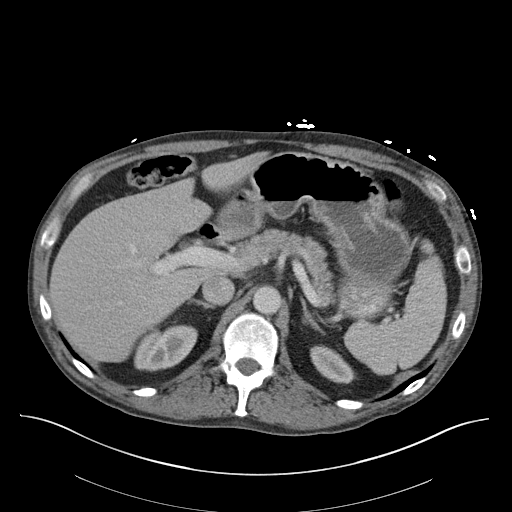
[im 85/97  soft-tissue]
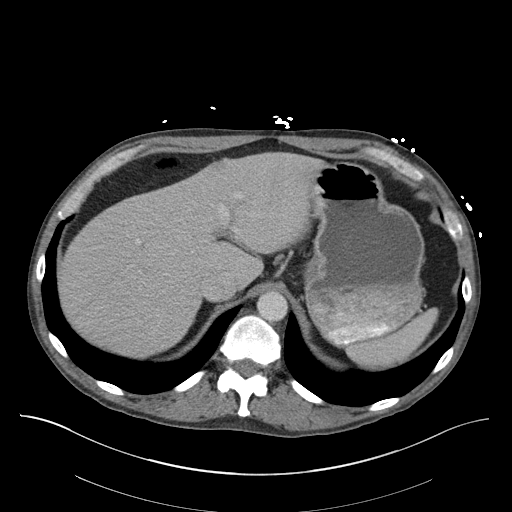
[im 91/97  soft-tissue]
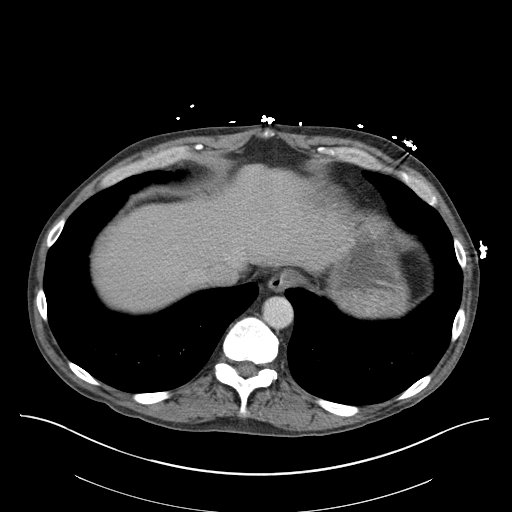

[Series 5: coronal st · coronal · 0.82mm/px · 3 of 143 slices shown]
[im 48/143  soft-tissue]
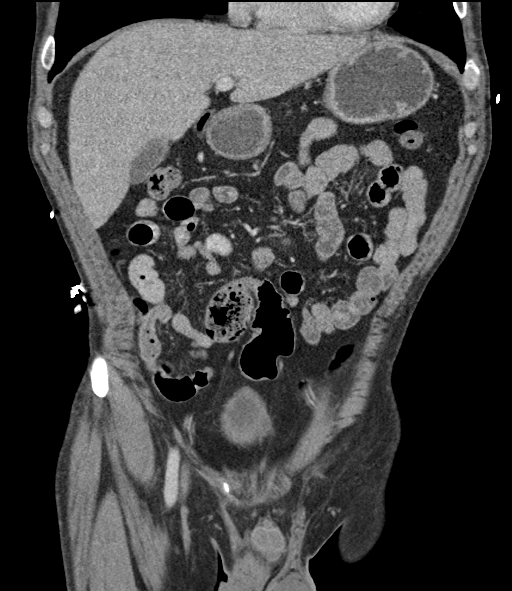
[im 64/143  soft-tissue]
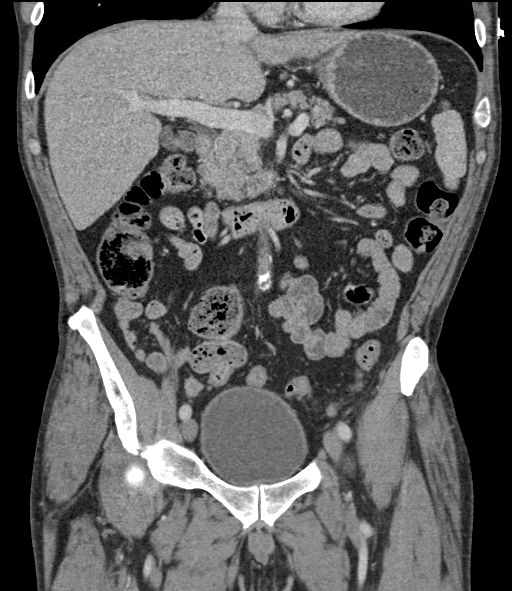
[im 79/143  soft-tissue]
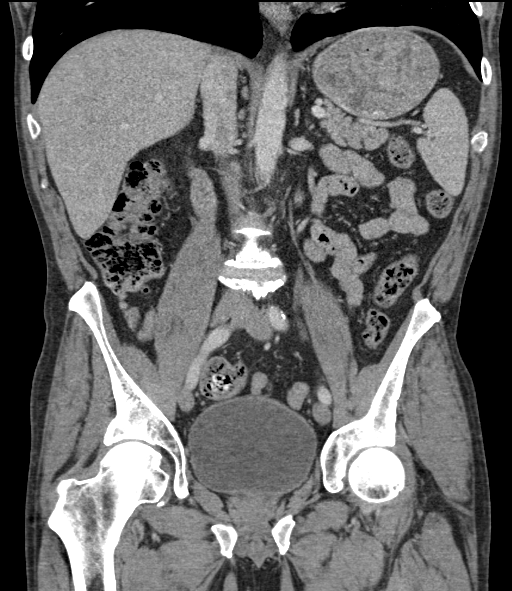

[16 of 46 positions shown; findings below may reference images not displayed]

FINDINGS: Lower chest: No acute abnormality.

Hepatobiliary: No focal liver abnormality is seen. No gallstones,
gallbladder wall thickening, or biliary dilatation.

Pancreas: Unremarkable. No pancreatic ductal dilatation or
surrounding inflammatory changes.

Spleen: Normal in size without focal abnormality.

Adrenals/Urinary Tract: Adrenal glands are unremarkable. Kidneys are
normal, without renal calculi, focal lesion, or hydronephrosis.
Bladder is unremarkable.

Stomach/Bowel: Stomach is within normal limits. Appendix appears
normal. There is mild circumferential thickening the distal sigmoid
colon and rectum. No pericolonic inflammatory changes. No dilated
loops of bowel.

Vascular/Lymphatic: No significant vascular findings are present. No
enlarged abdominal or pelvic lymph nodes.

Reproductive: Prostate is unremarkable.

Other: No abdominal wall hernia or abnormality. No abdominopelvic
ascites.

Musculoskeletal: Geographic sclerotic and lucent changes of the
superior right femoral head suggesting AVN. Femoral head contour is
maintained without collapse. No fractures.
IMPRESSION: 1. Mild circumferential thickening of the rectum and distal sigmoid
colon suggesting a nonspecific colitis.
2. Findings suggestive of avascular necrosis of the right femoral
head without subcortical collapse.

## 2022-08-19 ENCOUNTER — Other Ambulatory Visit: Payer: Self-pay

## 2022-08-19 ENCOUNTER — Emergency Department (HOSPITAL_COMMUNITY)
Admission: EM | Admit: 2022-08-19 | Discharge: 2022-08-19 | Disposition: A | Discharge status: Home / Self Care | Payer: BC Managed Care – PPO | Attending: Emergency Medicine | Admitting: Emergency Medicine

## 2022-08-19 DIAGNOSIS — M25572 Pain in left ankle and joints of left foot: Secondary | ICD-10-CM | POA: Diagnosis present

## 2022-08-19 DIAGNOSIS — M109 Gout, unspecified: Secondary | ICD-10-CM

## 2022-08-19 MED ORDER — OXYCODONE-ACETAMINOPHEN 5-325 MG PO TABS
2.0000 | ORAL_TABLET | Freq: Once | ORAL | Status: AC
  Administered 2022-08-19: 2 via ORAL
  Filled 2022-08-19: qty 2

## 2022-08-19 MED ORDER — COLCHICINE 0.6 MG PO TABS
1.2000 mg | ORAL_TABLET | ORAL | Status: AC
  Administered 2022-08-19: 1.2 mg via ORAL
  Filled 2022-08-19: qty 2

## 2022-08-19 MED ORDER — DEXAMETHASONE SODIUM PHOSPHATE 10 MG/ML IJ SOLN
10.0000 mg | Freq: Once | INTRAMUSCULAR | Status: AC
  Administered 2022-08-19: 10 mg via INTRAMUSCULAR
  Filled 2022-08-19: qty 1

## 2022-08-19 MED ORDER — COLCHICINE 0.6 MG PO TABS: 0.6000 mg | ORAL_TABLET | Freq: Two times a day (BID) | ORAL | 0 refills | Status: DC

## 2022-08-19 MED ORDER — COLCHICINE 0.6 MG PO TABS: 0.6000 mg | ORAL_TABLET | Freq: Two times a day (BID) | ORAL | 0 refills | Status: AC

## 2022-08-19 NOTE — ED Triage Notes (Signed)
Pt complains of left ankle pain started last Thursday or Friday. Denies any injury. Pt states feel like gout. History of gout. Ankle is red and swollen. Unable to apply weight

## 2022-08-19 NOTE — ED Provider Notes (Signed)
[place] EMERGENCY DEPARTMENT AT [facility] Provider Note   CSN: [id number] Arrival date & time: 08/19/22  1909     History  Chief Complaint  Patient presents with   Ankle Pain    Brandon Miller is a 57 y.o. male.   Ankle Pain  This patient is a 57 year old male with a history of gout, he is not currently on allopurinol, he has a flareup approximately every 3 months and it is usually in his toe or his ankle, 2 days ago he started having pain in his left ankle, persistent, gradually worsening, associated redness and warmth, no fevers or chills, no other affected joints, he has not taken any medications because he states usually they will go away without anything, this 1 is gotten worse instead    Home Medications Prior to Admission medications   Medication Sig Start Date End Date Taking? Authorizing Provider  amphetamine-dextroamphetamine (ADDERALL) 20 MG tablet Take 20 mg by mouth 2 (two) times daily. 09/14/20   [provider]  B Complex-C (B-COMPLEX WITH VITAMIN C) tablet Take 1 tablet by mouth daily. 10/18/20   Karsten Ro, MD  benzocaine (ORAJEL) 10 % mucosal gel Use as directed in the mouth or throat 2 (two) times daily as needed for mouth pain (toothpain). 10/17/20 11/16/20  Karsten Ro, MD  colchicine 0.6 MG tablet Take 1 tablet (0.6 mg total) by mouth 2 (two) times daily. 08/19/22   Eber Hong, MD  gabapentin (NEURONTIN) 400 MG capsule Take 1 capsule (400 mg total) by mouth 3 (three) times daily. 10/17/20 11/16/20  Karsten Ro, MD  mirtazapine (REMERON) 30 MG tablet Take 1 tablet (30 mg total) by mouth at bedtime. 10/17/20 11/16/20  Karsten Ro, MD  Multiple Vitamin (MULTIVITAMIN WITH MINERALS) TABS tablet Take 1 tablet by mouth daily. Patient not taking: No sig reported 05/18/20   Estella Husk, MD  Naphazoline HCl (CLEAR EYES OP) Apply 1 drop to eye daily as needed (dry red eyes).    [provider]  nicotine (NICODERM CQ -  DOSED IN MG/24 HR) 7 mg/24hr patch Place 1 patch (7 mg total) onto the skin daily. 10/18/20   Karsten Ro, MD  omega-3 acid ethyl esters (LOVAZA) 1 g capsule Take 1 capsule (1 g total) by mouth 2 (two) times daily. 10/17/20   Karsten Ro, MD  QUEtiapine (SEROQUEL) 25 MG tablet Take 1 tablet (25 mg total) by mouth at bedtime. 10/17/20 11/16/20  Karsten Ro, MD      Allergies    Daypro [oxaprozin], Diclofenac sodium, Indocin [indomethacin], and Zanaflex [tizanidine hcl]    Review of Systems   Review of Systems  All other systems reviewed and are negative.   Physical Exam Updated Vital Signs BP 128/80   Pulse 71   Temp 98.8 F (37.1 C)   Resp 18   Ht 1.905 m (6\' 3" )   Wt 102.1 kg   SpO2 100%   BMI 28.12 kg/m  Physical Exam Vitals and nursing note reviewed.  Constitutional:      General: He is not in acute distress.    Appearance: He is well-developed.  HENT:     Head: Normocephalic and atraumatic.     Mouth/Throat:     Pharynx: No oropharyngeal exudate.  Eyes:     General: No scleral icterus.       Right eye: No discharge.        Left eye: No discharge.     Conjunctiva/sclera: Conjunctivae normal.  Pupils: Pupils are equal, round, and reactive to light.  Neck:     Thyroid: No thyromegaly.     Vascular: No JVD.  Cardiovascular:     Rate and Rhythm: Normal rate and regular rhythm.     Heart sounds: Normal heart sounds. No murmur heard.    No friction rub. No gallop.  Pulmonary:     Effort: Pulmonary effort is normal. No respiratory distress.     Breath sounds: Normal breath sounds. No wheezing or rales.  Abdominal:     General: Bowel sounds are normal. There is no distension.     Palpations: Abdomen is soft. There is no mass.     Tenderness: There is no abdominal tenderness.  Musculoskeletal:        General: Swelling and tenderness present.     Cervical back: Normal range of motion and neck supple.     Right lower leg: No edema.     Left lower leg: No edema.      Comments: Left ankle with decreased range of motion, warmth, redness, no other joint abnormalities on his body, he has normal pulses at the dorsum of the feet bilaterally  Lymphadenopathy:     Cervical: No cervical adenopathy.  Skin:    General: Skin is warm and dry.     Findings: No erythema or rash.  Neurological:     Mental Status: He is alert.     Coordination: Coordination normal.  Psychiatric:        Behavior: Behavior normal.     ED Results / Procedures / Treatments   Labs (all labs ordered are listed, but only abnormal results are displayed) Labs Reviewed - No data to display  EKG None  Radiology No results found.  Procedures Procedures    Medications Ordered in ED Medications  colchicine tablet 1.2 mg (has no administration in time range)  dexamethasone (DECADRON) injection 10 mg (has no administration in time range)  oxyCODONE-acetaminophen (PERCOCET/ROXICET) 5-325 MG per tablet 2 tablet (has no administration in time range)    ED Course/ Medical Decision Making/ A&P                             Medical Decision Making Risk Prescription drug management.   No signs of infection, no fever or tachycardia, likely has acute gouty arthritis.  Offered medications including the following which the patient agreed to  Decadron IM Colchicine p.o. 2 Percocets at home with colchicine        Final Clinical Impression(s) / ED Diagnoses Final diagnoses:  Acute gouty arthritis    Rx / DC Orders ED Discharge Orders          Ordered    colchicine 0.6 MG tablet  2 times daily,   Status:  Discontinued        08/19/22 2015    colchicine 0.6 MG tablet  2 times daily        08/19/22 2016              Eber Hong, MD 08/19/22 2016

## 2022-08-19 NOTE — Discharge Instructions (Addendum)
Return to the ER for severe pain or fever, the pain will likely last for couple more days before it starts to get better.  I have given you 3 different medications tonight 1 of which was a steroid, I also gave you 2 Percocet and some colchicine.  Please continue to take colchicine twice a day, 1 tablet in the morning and 1 tablet in the evening starting tomorrow morning.  Stay off of your foot, this should gradually improve  Thank you for allowing [place] to treat you in the emergency department today.  After reviewing your examination and potential testing that was done it appears that you are safe to go home.  I would like for you to follow-up with your doctor within the next several days, have them obtain your results and follow-up with them to review all of these tests.  If you should develop severe or worsening symptoms return to the emergency department immediately

## 2022-11-14 ENCOUNTER — Other Ambulatory Visit: Payer: Self-pay

## 2022-11-14 ENCOUNTER — Emergency Department (HOSPITAL_COMMUNITY): Payer: BC Managed Care – PPO

## 2022-11-14 ENCOUNTER — Encounter (HOSPITAL_COMMUNITY): Payer: Self-pay | Admitting: Radiology

## 2022-11-14 ENCOUNTER — Emergency Department (HOSPITAL_COMMUNITY)
Admission: EM | Admit: 2022-11-14 | Discharge: 2022-11-15 | Disposition: A | Discharge status: Home / Self Care | Payer: BC Managed Care – PPO | Attending: Emergency Medicine | Admitting: Emergency Medicine

## 2022-11-14 DIAGNOSIS — F17210 Nicotine dependence, cigarettes, uncomplicated: Secondary | ICD-10-CM | POA: Insufficient documentation

## 2022-11-14 DIAGNOSIS — Z96651 Presence of right artificial knee joint: Secondary | ICD-10-CM | POA: Diagnosis not present

## 2022-11-14 DIAGNOSIS — D72829 Elevated white blood cell count, unspecified: Secondary | ICD-10-CM | POA: Diagnosis not present

## 2022-11-14 DIAGNOSIS — E119 Type 2 diabetes mellitus without complications: Secondary | ICD-10-CM | POA: Diagnosis not present

## 2022-11-14 DIAGNOSIS — J189 Pneumonia, unspecified organism: Secondary | ICD-10-CM | POA: Diagnosis not present

## 2022-11-14 DIAGNOSIS — R7989 Other specified abnormal findings of blood chemistry: Secondary | ICD-10-CM | POA: Insufficient documentation

## 2022-11-14 DIAGNOSIS — Z1152 Encounter for screening for COVID-19: Secondary | ICD-10-CM | POA: Diagnosis not present

## 2022-11-14 DIAGNOSIS — R059 Cough, unspecified: Secondary | ICD-10-CM | POA: Diagnosis present

## 2022-11-14 LAB — COMPREHENSIVE METABOLIC PANEL WITH GFR
ALT: 60 U/L — ABNORMAL HIGH (ref 0–44)
AST: 74 U/L — ABNORMAL HIGH (ref 15–41)
Albumin: 3.4 g/dL — ABNORMAL LOW (ref 3.5–5.0)
Alkaline Phosphatase: 84 U/L (ref 38–126)
Anion gap: 10 (ref 5–15)
BUN: 5 mg/dL — ABNORMAL LOW (ref 6–20)
CO2: 25 mmol/L (ref 22–32)
Calcium: 8.4 mg/dL — ABNORMAL LOW (ref 8.9–10.3)
Chloride: 98 mmol/L (ref 98–111)
Creatinine, Ser: 1.02 mg/dL (ref 0.61–1.24)
GFR, Estimated: >60 mL/min (ref >60)
Glucose, Bld: 141 mg/dL — ABNORMAL HIGH (ref 70–99)
Potassium: 3.5 mmol/L (ref 3.5–5.1)
Sodium: 133 mmol/L — ABNORMAL LOW (ref 135–145)
Total Bilirubin: 1 mg/dL (ref 0.3–1.2)
Total Protein: 7.4 g/dL (ref 6.5–8.1)

## 2022-11-14 LAB — RESP PANEL BY RT-PCR (RSV, FLU A&B, COVID)  RVPGX2
Influenza A by PCR: NEGATIVE
Influenza B by PCR: NEGATIVE
Resp Syncytial Virus by PCR: NEGATIVE
SARS Coronavirus 2 by RT PCR: NEGATIVE

## 2022-11-14 LAB — CBC WITH DIFFERENTIAL/PLATELET
Abs Immature Granulocytes: 0.05 K/uL (ref 0.00–0.07)
Basophils Absolute: 0 K/uL (ref 0.0–0.1)
Basophils Relative: 1 %
Eosinophils Absolute: 0.2 K/uL (ref 0.0–0.5)
Eosinophils Relative: 3 %
HCT: 43.2 % (ref 39.0–52.0)
Hemoglobin: 14.8 g/dL (ref 13.0–17.0)
Immature Granulocytes: 1 %
Lymphocytes Relative: 18 %
Lymphs Abs: 1.2 K/uL (ref 0.7–4.0)
MCH: 31.7 pg (ref 26.0–34.0)
MCHC: 34.3 g/dL (ref 30.0–36.0)
MCV: 92.5 fL (ref 80.0–100.0)
Monocytes Absolute: 0.9 K/uL (ref 0.1–1.0)
Monocytes Relative: 12 %
Neutro Abs: 4.6 K/uL (ref 1.7–7.7)
Neutrophils Relative %: 65 %
Platelets: 137 K/uL — ABNORMAL LOW (ref 150–400)
RBC: 4.67 MIL/uL (ref 4.22–5.81)
RDW: 11.9 % (ref 11.5–15.5)
WBC: 6.9 K/uL (ref 4.0–10.5)
nRBC: 0 % (ref 0.0–0.2)

## 2022-11-14 LAB — D-DIMER, QUANTITATIVE: D-Dimer, Quant: 1.34 ug{FEU}/mL — ABNORMAL HIGH (ref 0.00–0.50)

## 2022-11-14 MED ORDER — IOHEXOL 350 MG/ML SOLN
100.0000 mL | Freq: Once | INTRAVENOUS | Status: AC | PRN
  Administered 2022-11-14: 100 mL via INTRAVENOUS

## 2022-11-14 MED ORDER — KETOROLAC TROMETHAMINE 15 MG/ML IJ SOLN
15.0000 mg | Freq: Once | INTRAMUSCULAR | Status: AC
  Administered 2022-11-14: 15 mg via INTRAVENOUS
  Filled 2022-11-14: qty 1

## 2022-11-14 MED ORDER — ALBUTEROL SULFATE HFA 108 (90 BASE) MCG/ACT IN AERS: 2.0000 | INHALATION_SPRAY | RESPIRATORY_TRACT | Status: DC | PRN

## 2022-11-14 MED ORDER — ALBUTEROL SULFATE HFA 108 (90 BASE) MCG/ACT IN AERS
1.0000 | INHALATION_SPRAY | Freq: Once | RESPIRATORY_TRACT | Status: AC
  Administered 2022-11-15: 1 via RESPIRATORY_TRACT
  Filled 2022-11-14: qty 6.7

## 2022-11-14 MED ORDER — PROCHLORPERAZINE EDISYLATE 10 MG/2ML IJ SOLN
10.0000 mg | Freq: Once | INTRAMUSCULAR | Status: AC
Start: 2022-11-14 — End: 2022-11-14
  Administered 2022-11-14: 10 mg via INTRAVENOUS
  Filled 2022-11-14: qty 2

## 2022-11-14 MED ORDER — DIPHENHYDRAMINE HCL 50 MG/ML IJ SOLN
25.0000 mg | Freq: Once | INTRAMUSCULAR | Status: AC
  Administered 2022-11-14: 25 mg via INTRAVENOUS
  Filled 2022-11-14: qty 1

## 2022-11-14 NOTE — ED Provider Notes (Signed)
[facility]Y DEPT [facility] Emergency Department Provider Note MRN:  [id number]  Arrival date & time: 11/15/22     Chief Complaint   Headache, Shortness of Breath, and Cough   History of Present Illness   Brandon Miller is a 57 y.o. year-old male with a history of diabetes, tobacco use presenting to the ED with chief complaint of cough.  Persistent cough for the past 2 or 3 days, associated with malaise, subjective fever, now developing some headaches, chest discomfort, shortness of breath.  Review of Systems  A thorough review of systems was obtained and all systems are negative except as noted in the HPI and PMH.   Patient's Health History    Past Medical History:  Diagnosis Date   Allergy    Anemia    Anxiety    Arthritis    RA   Complication of anesthesia    hard to wake up-was told he may have sleep apnea-never tested   Depression    Diabetes mellitus without complication (HCC)    with weight no more problems   Seizure (HCC)    had one seizure of unknown etiology a few tears ago, none since and on no meds for them   Sleep apnea    Snores     Past Surgical History:  Procedure Laterality Date   BIOPSY  02/12/2019   Procedure: BIOPSY;  Surgeon: Corbin Ade, MD;  Location[facility];  Service: Endoscopy;;  stomach   COLONOSCOPY WITH PROPOFOL N/A 02/12/2019   Procedure: COLONOSCOPY WITH PROPOFOL;  Surgeon: Corbin Ade, MD;  Location[facility];  Service: Endoscopy;  Laterality: N/A;  10:30am   ELBOW SURGERY Right 2010   Dr. Eulah Pont   ESOPHAGOGASTRODUODENOSCOPY (EGD) WITH PROPOFOL N/A 02/12/2019   Procedure: ESOPHAGOGASTRODUODENOSCOPY (EGD) WITH PROPOFOL;  Surgeon: Corbin Ade, MD;  Location: [facility];  Service: Endoscopy;  Laterality: N/A;   HERNIA REPAIR     Right inguinal   KNEE ARTHROSCOPY WITH MEDIAL MENISECTOMY Right 06/11/2013   Procedure: RIGHT KNEE ARTHROSCOPY WITH MEDIAL MENISECTOMY AND CHONDROPLASTY;  Surgeon: Loreta Ave, MD;  Location: [facility];  Service: Orthopedics;  Laterality: Right;  Chondroplasty, loose bodies, medial menisectomy, lysis of adhesions.   KNEE SURGERY Right 2011   POLYPECTOMY  02/12/2019   Procedure: POLYPECTOMY;  Surgeon: Corbin Ade, MD;  Location: [facility];  Service: Endoscopy;;   right ankle     SHOULDER SURGERY Right 2010   Dr. Eulah Pont   TOTAL KNEE ARTHROPLASTY Right 08/25/2014   Procedure: TOTAL KNEE ARTHROPLASTY;  Surgeon: Loreta Ave, MD;  Location: [facility] OR;  Service: Orthopedics;  Laterality: Right;    Family History  Problem Relation Age of Onset   Hepatitis Mother    Cirrhosis Mother        Did not drink   Pneumonia Father    Kidney failure Father    Gastric cancer Brother        ? Gastric CA spread to esophagus   Colon cancer Neg Hx     Social History   Socioeconomic History   Marital status: Single    Spouse name: Not on file   Number of children: Not on file   Years of education: Not on file   Highest education level: Not on file  Occupational History   Occupation: unemployed  Tobacco Use   Smoking status: Every Day    Current packs/day: 1.50    Average packs/day: 1.5 packs/day  for 25.0 years (37.5 ttl pk-yrs)    Types: Cigarettes   Smokeless tobacco: Never  Vaping Use   Vaping status: Never Used  Substance and Sexual Activity   Alcohol use: Yes    Comment: 6 beer during the week   Drug use: No   Sexual activity: Yes  Other Topics Concern   Not on file  Social History Narrative   Not on file   Social Determinants of Health   Financial Resource Strain: Not on file  Food Insecurity: Not on file  Transportation Needs: Not on file  Physical Activity: Not on file  Stress: Not on file  Social Connections: Not on file  Intimate Partner Violence: Not on file     Physical Exam   Vitals:   11/14/22 2124 11/15/22 0037  BP: 109/72   Pulse: 87   Resp: 19   Temp: 98.4 F (36.9 C)   SpO2: 94% 91%     CONSTITUTIONAL: Well-appearing, NAD NEURO/PSYCH:  Alert and oriented x 3, no focal deficits EYES:  eyes equal and reactive ENT/NECK:  no LAD, no JVD CARDIO: Regular rate, well-perfused, normal S1 and S2 PULM:  CTAB no wheezing or rhonchi GI/GU:  non-distended, non-tender MSK/SPINE:  No gross deformities, no edema SKIN:  no rash, atraumatic   *Additional and/or pertinent findings included in MDM below  Diagnostic and Interventional Summary    EKG Interpretation Date/Time:  Wednesday November 14 2022 21:32:11 EDT Ventricular Rate:  84 PR Interval:  134 QRS Duration:  88 QT Interval:  378 QTC Calculation: 446 R Axis:   53  Text Interpretation: Normal sinus rhythm Normal ECG When compared with ECG of 05-Jul-2021 18:53, No significant change was found Confirmed by Kennis Carina [phone]) on 11/14/2022 11:16:37 PM       Labs Reviewed  CBC WITH DIFFERENTIAL/PLATELET - Abnormal; Notable for the following components:      Result Value   Platelets 137 (*)    All other components within normal limits  COMPREHENSIVE METABOLIC PANEL - Abnormal; Notable for the following components:   Sodium 133 (*)    Glucose, Bld 141 (*)    BUN 5 (*)    Calcium 8.4 (*)    Albumin 3.4 (*)    AST 74 (*)    ALT 60 (*)    All other components within normal limits  D-DIMER, QUANTITATIVE - Abnormal; Notable for the following components:   D-Dimer, Quant 1.34 (*)    All other components within normal limits  RESP PANEL BY RT-PCR (RSV, FLU A&B, COVID)  [id number]    CT Angio Chest Pulmonary Embolism (PE) W or WO Contrast  Final Result    DG Chest 2 View  Final Result      Medications  albuterol (VENTOLIN HFA) 108 (90 Base) MCG/ACT inhaler 2 puff (has no administration in time range)  cefdinir (OMNICEF) capsule 300 mg (has no administration in time range)  ketorolac (TORADOL) 15 MG/ML injection 15 mg (15 mg Intravenous Given 11/14/22 2328)  diphenhydrAMINE (BENADRYL) injection 25 mg (25 mg  Intravenous Given 11/14/22 2328)  prochlorperazine (COMPAZINE) injection 10 mg (10 mg Intravenous Given 11/14/22 2328)  albuterol (VENTOLIN HFA) 108 (90 Base) MCG/ACT inhaler 1 puff (1 puff Inhalation Given 11/15/22 0037)  iohexol (OMNIPAQUE) 350 MG/ML injection 100 mL (100 mLs Intravenous Contrast Given 11/14/22 2338)     Procedures  /  Critical Care Procedures  ED Course and Medical Decision Making  Initial Impression and Ddx Differential diagnosis includes pneumonia, pneumothorax, less  likely PE, doubt ACS.  Past medical/surgical history that increases complexity of ED encounter: Tobacco use  Interpretation of Diagnostics I personally reviewed the Chest Xray and my interpretation is as follows: Suspicious for right lower lobe infiltrate  Labs reveal leukocytosis, elevated D-dimer.  Patient Reassessment and Ultimate Disposition/Management     CT a is without PE, evidence of multifocal pneumonia.  Some reactive lymph nodes that may need repeat imaging in the future, patient informed of this.  Continued reassuring vital signs, feels better, appropriate for discharge.  Patient management required discussion with the following services or consulting groups:  None  Complexity of Problems Addressed Acute illness or injury that poses threat of life of bodily function  Additional Data Reviewed and Analyzed Further history obtained from: Prior labs/imaging results  Additional Factors Impacting ED Encounter Risk Prescriptions and Consideration of hospitalization  Elmer Sow. Pilar Plate, MD [facility] Emergency Medicine [facility] [email or web address]  Final Clinical Impressions(s) / ED Diagnoses     ICD-10-CM   1. Multifocal pneumonia  J18.9       ED Discharge Orders          Ordered    cefdinir (OMNICEF) 300 MG capsule  2 times daily        11/15/22 0054    azithromycin (ZITHROMAX) 250 MG tablet        11/15/22 0054             Discharge Instructions  Discussed with and Provided to Patient:     Discharge Instructions      You were evaluated in the Emergency Department and after careful evaluation, we did not find any emergent condition requiring admission or further testing in the hospital.  Your exam/testing today is overall reassuring.  Symptoms seem to be due to pneumonia.  Take the cefdinir and azithromycin antibiotics as directed.  Tylenol or Motrin for discomfort, plenty of rest and fluids.  Follow-up with primary care doctor for reassessment in 2 or 3 weeks as we discussed.  Please return to the Emergency Department if you experience any worsening of your condition.   Thank you for allowing [place] to be a part of your care.       Sabas Sous, MD 11/15/22 [phone]

## 2022-11-14 NOTE — ED Triage Notes (Signed)
Pt states that Sunday he started feeling bad  with headache, cough and shortness of breath. Pt states he hoped it would get better but is only getting worse. Pt states his lungs burn and his ribs hurt from coughing so much. States 101 temp at home.

## 2022-11-14 NOTE — ED Notes (Signed)
Patient transported to CT 

## 2022-11-15 MED ORDER — CEFDINIR 300 MG PO CAPS
300.0000 mg | ORAL_CAPSULE | Freq: Once | ORAL | Status: AC
  Administered 2022-11-15: 300 mg via ORAL
  Filled 2022-11-15: qty 1

## 2022-11-15 MED ORDER — CEFDINIR 300 MG PO CAPS: 300.0000 mg | ORAL_CAPSULE | Freq: Two times a day (BID) | ORAL | 0 refills | Status: AC

## 2022-11-15 MED ORDER — AZITHROMYCIN 250 MG PO TABS: ORAL_TABLET | ORAL | 0 refills | Status: DC

## 2022-11-15 NOTE — Discharge Instructions (Signed)
You were evaluated in the Emergency Department and after careful evaluation, we did not find any emergent condition requiring admission or further testing in the hospital.  Your exam/testing today is overall reassuring.  Symptoms seem to be due to pneumonia.  Take the cefdinir and azithromycin antibiotics as directed.  Tylenol or Motrin for discomfort, plenty of rest and fluids.  Follow-up with primary care doctor for reassessment in 2 or 3 weeks as we discussed.  Please return to the Emergency Department if you experience any worsening of your condition.   Thank you for allowing [place] to be a part of your care.

## 2023-03-03 ENCOUNTER — Emergency Department (HOSPITAL_COMMUNITY): Payer: BC Managed Care – PPO

## 2023-03-03 ENCOUNTER — Emergency Department (HOSPITAL_COMMUNITY)
Admission: EM | Admit: 2023-03-03 | Discharge: 2023-03-04 | Disposition: A | Discharge status: Home / Self Care | Payer: BC Managed Care – PPO | Attending: Emergency Medicine | Admitting: Emergency Medicine

## 2023-03-03 ENCOUNTER — Other Ambulatory Visit: Payer: Self-pay

## 2023-03-03 DIAGNOSIS — M25421 Effusion, right elbow: Secondary | ICD-10-CM | POA: Diagnosis present

## 2023-03-03 DIAGNOSIS — E119 Type 2 diabetes mellitus without complications: Secondary | ICD-10-CM | POA: Diagnosis not present

## 2023-03-03 DIAGNOSIS — F17210 Nicotine dependence, cigarettes, uncomplicated: Secondary | ICD-10-CM | POA: Insufficient documentation

## 2023-03-03 DIAGNOSIS — M25521 Pain in right elbow: Secondary | ICD-10-CM | POA: Diagnosis not present

## 2023-03-03 DIAGNOSIS — Z96651 Presence of right artificial knee joint: Secondary | ICD-10-CM | POA: Insufficient documentation

## 2023-03-03 DIAGNOSIS — M7031 Other bursitis of elbow, right elbow: Secondary | ICD-10-CM

## 2023-03-03 LAB — CBC WITH DIFFERENTIAL/PLATELET
Abs Immature Granulocytes: 0.05 10*3/uL (ref 0.00–0.07)
Basophils Absolute: 0.1 10*3/uL (ref 0.0–0.1)
Basophils Relative: 1 %
Eosinophils Absolute: 0.1 10*3/uL (ref 0.0–0.5)
Eosinophils Relative: 1 %
HCT: 46.6 % (ref 39.0–52.0)
Hemoglobin: 16.3 g/dL (ref 13.0–17.0)
Immature Granulocytes: 1 %
Lymphocytes Relative: 25 %
Lymphs Abs: 2.1 10*3/uL (ref 0.7–4.0)
MCH: 31.2 pg (ref 26.0–34.0)
MCHC: 35 g/dL (ref 30.0–36.0)
MCV: 89.3 fL (ref 80.0–100.0)
Monocytes Absolute: 1 10*3/uL (ref 0.1–1.0)
Monocytes Relative: 12 %
Neutro Abs: 5 10*3/uL (ref 1.7–7.7)
Neutrophils Relative %: 60 %
Platelets: 168 10*3/uL (ref 150–400)
RBC: 5.22 MIL/uL (ref 4.22–5.81)
RDW: 12.2 % (ref 11.5–15.5)
WBC: 8.3 10*3/uL (ref 4.0–10.5)
nRBC: 0 % (ref 0.0–0.2)

## 2023-03-03 MED ORDER — HYDROMORPHONE HCL 1 MG/ML IJ SOLN
1.0000 mg | Freq: Once | INTRAMUSCULAR | Status: AC
  Administered 2023-03-03: 1 mg via INTRAVENOUS
  Filled 2023-03-03: qty 1

## 2023-03-03 NOTE — ED Triage Notes (Signed)
 Pt c/o swelling to right elbow, denies any recent injury. Reports pain started over a week ago but swelling started today. Unable to move arm without severe pain.  Hx of gout

## 2023-03-03 NOTE — ED Provider Notes (Signed)
 [facility]-EMERGENCY DEPT [facility] Emergency Department Provider Note MRN:  [id number]  Arrival date & time: 03/04/23     Chief Complaint   Arm Pain   History of Present Illness   Brandon Miller is a 58 y.o. year-old male with a history of diabetes presenting to the ED with chief complaint of arm pain.  Pain and swelling to the right elbow progressively worsening over the past 24 hours.  Worked yesterday, moving a lot of boxes which is not abnormal for him.  Noticed some mild pain at work but then it got a lot worse.  Denies fevers, no other complaints.  Reports a history of gout.  Review of Systems  A thorough review of systems was obtained and all systems are negative except as noted in the HPI and PMH.   Patient's Health History    Past Medical History:  Diagnosis Date   Allergy    Anemia    Anxiety    Arthritis    RA   Complication of anesthesia    hard to wake up-was told he may have sleep apnea-never tested   Depression    Diabetes mellitus without complication (HCC)    with weight no more problems   Seizure (HCC)    had one seizure of unknown etiology a few tears ago, none since and on no meds for them   Sleep apnea    Snores     Past Surgical History:  Procedure Laterality Date   BIOPSY  02/12/2019   Procedure: BIOPSY;  Surgeon: Shaaron Lamar HERO, MD;  Location[facility];  Service: Endoscopy;;  stomach   COLONOSCOPY WITH PROPOFOL  N/A 02/12/2019   Procedure: COLONOSCOPY WITH PROPOFOL ;  Surgeon: Shaaron Lamar HERO, MD;  Location[facility];  Service: Endoscopy;  Laterality: N/A;  10:30am   ELBOW SURGERY Right 2010   Dr. Beverley   ESOPHAGOGASTRODUODENOSCOPY (EGD) WITH PROPOFOL  N/A 02/12/2019   Procedure: ESOPHAGOGASTRODUODENOSCOPY (EGD) WITH PROPOFOL ;  Surgeon: Shaaron Lamar HERO, MD;  [facility];  Service: Endoscopy;  Laterality: N/A;   HERNIA REPAIR     Right inguinal   KNEE ARTHROSCOPY WITH MEDIAL MENISECTOMY Right 06/11/2013   Procedure: RIGHT  KNEE ARTHROSCOPY WITH MEDIAL MENISECTOMY AND CHONDROPLASTY;  Surgeon: Toribio JULIANNA Beverley, MD;  Location: [facility];  Service: Orthopedics;  Laterality: Right;  Chondroplasty, loose bodies, medial menisectomy, lysis of adhesions.   KNEE SURGERY Right 2011   POLYPECTOMY  02/12/2019   Procedure: POLYPECTOMY;  Surgeon: Shaaron Lamar HERO, MD;  Location[facility];  Service: Endoscopy;;   right ankle     SHOULDER SURGERY Right 2010   Dr. Beverley   TOTAL KNEE ARTHROPLASTY Right 08/25/2014   Procedure: TOTAL KNEE ARTHROPLASTY;  Surgeon: Toribio JULIANNA Beverley, MD;  Location: [facility] OR;  Service: Orthopedics;  Laterality: Right;    Family History  Problem Relation Age of Onset   Hepatitis Mother    Cirrhosis Mother        Did not drink   Pneumonia Father    Kidney failure Father    Gastric cancer Brother        ? Gastric CA spread to esophagus   Colon cancer Neg Hx     Social History   Socioeconomic History   Marital status: Single    Spouse name: Not on file   Number of children: Not on file   Years of education: Not on file   Highest education level: Not on file  Occupational History  Occupation: unemployed  Tobacco Use   Smoking status: Every Day    Current packs/day: 1.50    Average packs/day: 1.5 packs/day for 25.0 years (37.5 ttl pk-yrs)    Types: Cigarettes   Smokeless tobacco: Never  Vaping Use   Vaping status: Never Used  Substance and Sexual Activity   Alcohol use: Yes    Comment: 6 beer during the week   Drug use: No   Sexual activity: Yes  Other Topics Concern   Not on file  Social History Narrative   Not on file   Social Drivers of Health   Financial Resource Strain: Not on file  Food Insecurity: Low Risk  (02/21/2023)   Received from [facility]   Hunger Vital Sign    Worried About Running Out of Food in the Last Year: Never true    Ran Out of Food in the Last Year: Never true  Transportation Needs: No Transportation Needs (02/21/2023)   Received from  [employer]    In the past 12 months, has lack of reliable transportation kept you from medical appointments, meetings, work or from getting things needed for daily living? : No  Physical Activity: Not on file  Stress: Not on file  Social Connections: Not on file  Intimate Partner Violence: Not on file     Physical Exam   Vitals:   03/04/23 0230 03/04/23 0615  BP: (!) 145/70 (!) 151/81  Pulse: 71 62  Resp: 17 18  Temp:    SpO2: 94% 94%    CONSTITUTIONAL: Well-appearing, NAD NEURO/PSYCH:  Alert and oriented x 3, no focal deficits EYES:  eyes equal and reactive ENT/NECK:  no LAD, no JVD CARDIO: Regular rate, well-perfused, normal S1 and S2 PULM:  CTAB no wheezing or rhonchi GI/GU:  non-distended, non-tender MSK/SPINE:  No gross deformities, no edema SKIN: Swollen and erythematous right elbow, significant reduced range of motion due to pain   *Additional and/or pertinent findings included in MDM below  Diagnostic and Interventional Summary    EKG Interpretation Date/Time:    Ventricular Rate:    PR Interval:    QRS Duration:    QT Interval:    QTC Calculation:   R Axis:      Text Interpretation:         Labs Reviewed  COMPREHENSIVE METABOLIC PANEL - Abnormal; Notable for the following components:      Result Value   Sodium 134 (*)    CO2 19 (*)    Glucose, Bld 112 (*)    Calcium  8.6 (*)    All other components within normal limits  BODY FLUID CULTURE W GRAM STAIN  CBC WITH DIFFERENTIAL/PLATELET  SEDIMENTATION RATE  C-REACTIVE PROTEIN  GLUCOSE, BODY FLUID OTHER            PROTEIN, BODY FLUID (OTHER)  SYNOVIAL CELL COUNT + DIFF, W/ CRYSTALS    DG Elbow Complete Right  Final Result    CT HUMERUS RIGHT W CONTRAST    (Results Pending)  CT FOREARM RIGHT W CONTRAST    (Results Pending)    Medications  HYDROmorphone  (DILAUDID ) injection 1 mg (1 mg Intravenous Given 03/03/23 2336)  lidocaine  (PF) (XYLOCAINE ) 1 % injection 10 mL (5 mLs  Infiltration Given 03/04/23 0602)  methylPREDNISolone  sodium succinate (SOLU-MEDROL ) 125 mg/2 mL injection 125 mg (125 mg Intravenous Given 03/04/23 0142)  HYDROmorphone  (DILAUDID ) injection 1 mg (1 mg Intravenous Given 03/04/23 0142)  HYDROmorphone  (DILAUDID ) injection 1 mg (1 mg Intravenous Given  03/04/23 0248)  iohexol  (OMNIPAQUE ) 300 MG/ML solution 75 mL (75 mLs Intravenous Contrast Given 03/04/23 0253)  bupivacaine (PF) (MARCAINE ) 0.5 % injection 30 mL (1.5 mLs Infiltration Given 03/04/23 0601)  methylPREDNISolone  sodium succinate (SOLU-MEDROL ) 125 mg/2 mL injection 40 mg (40 mg Intramuscular Given 03/04/23 0601)  HYDROmorphone  (DILAUDID ) injection 1 mg (1 mg Intravenous Given 03/04/23 9386)     Procedures  /  Critical Care .Ultrasound ED Soft Tissue  Date/Time: 03/03/2023 11:55 PM  Performed by: Theadore Ozell HERO, MD Authorized by: Theadore Ozell HERO, MD   Procedure details:    Indications: limb pain     Transverse view:  Visualized   Longitudinal view:  Visualized   Images: archived   Location:    Location comment:  Right elbow Comments:     Small hypoechoic fluid collection adjacent to the bone .Joint Aspiration/Arthrocentesis  Date/Time: 03/04/2023 7:15 AM  Performed by: Theadore Ozell HERO, MD Authorized by: Theadore Ozell HERO, MD   Consent:    Consent obtained:  Verbal   Consent given by:  Patient   Risks, benefits, and alternatives were discussed: yes     Risks discussed:  Bleeding, incomplete drainage, nerve damage, infection, pain and poor cosmetic result   Alternatives discussed:  No treatment Universal protocol:    Procedure explained and questions answered to patient or proxy's satisfaction: yes     Test results available: yes     Imaging studies available: yes     Site/side marked: yes     Patient identity confirmed:  Verbally with patient Location:    Location:  Elbow Anesthesia:    Anesthesia method:  Local infiltration   Local anesthetic:  Lidocaine  1% w/o epi Procedure  details:    Preparation: Patient was prepped and draped in usual sterile fashion     Needle gauge:  18 G   Ultrasound guidance: yes     Approach:  Lateral   Aspirate amount:  6cc   Aspirate characteristics:  Cloudy and yellow   Steroid injected: yes   Post-procedure details:    Dressing:  Adhesive bandage   Procedure completion:  Tolerated well, no immediate complications Comments:     Removal of small amount of synovial fluid using real-time inline ultrasound guidance followed by intra-articular injection of bupivacaine  and methylprednisolone .   ED Course and Medical Decision Making  Initial Impression and Ddx Exam is suspicious for gout however there is a fair amount of increased warmth, redness raising concern for septic bursitis versus septic joint.  Bedside ultrasound showing a thin strip of fluid adjacent to the bone, will consider arthrocentesis.  Past medical/surgical history that increases complexity of ED encounter: Diabetes, reported history of gout but never in the elbow  Interpretation of Diagnostics I personally reviewed the elbow x-ray and my interpretation is as follows: Some soft tissue swelling but no other significant findings  Labs are reassuring with no significant blood count or electrolyte disturbance, no leukocytosis, no elevation of inflammatory markers.  Patient Reassessment and Ultimate Disposition/Management     Patient explains that this happened once before and it was due to some kind of tendinopathy that required surgery with Dr. Beverley years ago.  We discussed the options at this point, still considering the pros and cons of arthrocentesis.  The pocket of fluid is 3 mm on the ultrasound and so the concern is that it would be difficult to successfully obtain and if it were obtained it would be a small amount that would not be adequate for synovial  fluid analysis.  Given this low volume and patient's reassuring laboratory assessment overall it seems like a  low yield procedure at this time that may cause more harm than good.  Given his history of some kind of tendinopathy as well as a history of gout, a noninfectious cause it seems most likely.  7 AM update: Patient with difficult to control pain requiring multiple doses of Dilaudid , somewhat of a pain out of proportion, seems more painful than typical gout.  Possibly the redness is spreading to the humerus.  CT obtained to exclude necrotizing infection.  Case discussed with Dr. Josefina of orthopedic surgery, gout is favored but still recommending arthrocentesis with injection of steroid with Marcaine , see procedural details above.  Hoping for better pain control and synovial fluid analysis consistent with gout, would reach out to orthopedics with concerning findings.  Signed out to oncoming provider at shift change.  Patient management required discussion with the following services or consulting groups:  Orthopedic Surgery  Complexity of Problems Addressed Acute illness or injury that poses threat of life of bodily function  Additional Data Reviewed and Analyzed Further history obtained from: Prior labs/imaging results  Additional Factors Impacting ED Encounter Risk Consideration of hospitalization  Ozell HERO. Theadore, MD [facility] Emergency Medicine [facility] [email or web address]  Final Clinical Impressions(s) / ED Diagnoses     ICD-10-CM   1. Right elbow pain  M25.521       ED Discharge Orders     None        Discharge Instructions Discussed with and Provided to Patient:   Discharge Instructions   None      Theadore Ozell HERO, MD 03/04/23 [phone]

## 2023-03-04 ENCOUNTER — Emergency Department (HOSPITAL_COMMUNITY): Payer: BC Managed Care – PPO

## 2023-03-04 LAB — SYNOVIAL CELL COUNT + DIFF, W/ CRYSTALS
Crystals, Fluid: NONE SEEN
Eosinophils-Synovial: 0 % (ref 0–1)
Lymphocytes-Synovial Fld: 3 % (ref 0–20)
Monocyte-Macrophage-Synovial Fluid: 4 % — ABNORMAL LOW (ref 50–90)
Neutrophil, Synovial: 93 % — ABNORMAL HIGH (ref 0–25)
WBC, Synovial: 12540 /mm3 — ABNORMAL HIGH (ref 0–200)

## 2023-03-04 LAB — COMPREHENSIVE METABOLIC PANEL
ALT: 21 U/L (ref 0–44)
AST: 25 U/L (ref 15–41)
Albumin: 3.7 g/dL (ref 3.5–5.0)
Alkaline Phosphatase: 68 U/L (ref 38–126)
Anion gap: 12 (ref 5–15)
BUN: 8 mg/dL (ref 6–20)
CO2: 19 mmol/L — ABNORMAL LOW (ref 22–32)
Calcium: 8.6 mg/dL — ABNORMAL LOW (ref 8.9–10.3)
Chloride: 103 mmol/L (ref 98–111)
Creatinine, Ser: 0.83 mg/dL (ref 0.61–1.24)
GFR, Estimated: >60 mL/min (ref >60)
Glucose, Bld: 112 mg/dL — ABNORMAL HIGH (ref 70–99)
Potassium: 4 mmol/L (ref 3.5–5.1)
Sodium: 134 mmol/L — ABNORMAL LOW (ref 135–145)
Total Bilirubin: 1 mg/dL (ref 0.0–1.2)
Total Protein: 7.5 g/dL (ref 6.5–8.1)

## 2023-03-04 LAB — C-REACTIVE PROTEIN: CRP: 5 mg/dL — ABNORMAL HIGH (ref <1.0)

## 2023-03-04 LAB — SEDIMENTATION RATE: Sed Rate: 11 mm/h (ref 0–16)

## 2023-03-04 MED ORDER — OXYCODONE-ACETAMINOPHEN 5-325 MG PO TABS: 1.0000 | ORAL_TABLET | Freq: Three times a day (TID) | ORAL | 0 refills | Status: DC | PRN

## 2023-03-04 MED ORDER — IOHEXOL 300 MG/ML  SOLN
75.0000 mL | Freq: Once | INTRAMUSCULAR | Status: AC | PRN
  Administered 2023-03-04: 75 mL via INTRAVENOUS

## 2023-03-04 MED ORDER — METHYLPREDNISOLONE SODIUM SUCC 125 MG IJ SOLR
40.0000 mg | Freq: Once | INTRAMUSCULAR | Status: AC
  Administered 2023-03-04: 40 mg via INTRAMUSCULAR
  Filled 2023-03-04: qty 2

## 2023-03-04 MED ORDER — METHYLPREDNISOLONE SODIUM SUCC 125 MG IJ SOLR
125.0000 mg | Freq: Once | INTRAMUSCULAR | Status: AC
  Administered 2023-03-04: 125 mg via INTRAVENOUS
  Filled 2023-03-04: qty 2

## 2023-03-04 MED ORDER — FENTANYL CITRATE (PF) 100 MCG/2ML IJ SOLN
100.0000 ug | Freq: Once | INTRAMUSCULAR | Status: AC
  Administered 2023-03-04: 100 ug via INTRAVENOUS
  Filled 2023-03-04: qty 2

## 2023-03-04 MED ORDER — HYDROMORPHONE HCL 1 MG/ML IJ SOLN
1.0000 mg | Freq: Once | INTRAMUSCULAR | Status: AC
  Administered 2023-03-04: 1 mg via INTRAVENOUS
  Filled 2023-03-04: qty 1

## 2023-03-04 MED ORDER — CEPHALEXIN 500 MG PO CAPS: 500.0000 mg | ORAL_CAPSULE | Freq: Four times a day (QID) | ORAL | 0 refills | Status: DC

## 2023-03-04 MED ORDER — HYDROMORPHONE HCL 1 MG/ML IJ SOLN
1.0000 mg | Freq: Once | INTRAMUSCULAR | Status: AC
Start: 2023-03-04 — End: 2023-03-04
  Administered 2023-03-04: 1 mg via INTRAVENOUS
  Filled 2023-03-04: qty 1

## 2023-03-04 MED ORDER — LIDOCAINE HCL (PF) 1 % IJ SOLN
10.0000 mL | Freq: Once | INTRAMUSCULAR | Status: AC
  Administered 2023-03-04: 5 mL
  Filled 2023-03-04: qty 10

## 2023-03-04 MED ORDER — BUPIVACAINE HCL (PF) 0.5 % IJ SOLN
30.0000 mL | Freq: Once | INTRAMUSCULAR | Status: AC
  Administered 2023-03-04: 1.5 mL
  Filled 2023-03-04: qty 30

## 2023-03-04 NOTE — Discharge Instructions (Addendum)
 You are seen in the ER for elbow swelling. The workup in the emergency room is overall reassuring.  It appears that you have most likely an infection surrounding your elbow.  Start taking the antibiotics that are prescribed.  Call the orthopedic doctor at the number provided.  They would like to have you be seen sometime later this week.

## 2023-03-04 NOTE — ED Provider Notes (Addendum)
  Physical Exam  BP (!) 151/81   Pulse 62   Temp 98.2 F (36.8 C) (Oral)   Resp 18   SpO2 94%   Physical Exam  Procedures  Procedures  ED Course / MDM    Medical Decision Making Amount and/or Complexity of Data Reviewed Labs: ordered. Radiology: ordered.  Risk Prescription drug management.   Assuming care of patient from Dr. Harless Lien   Patient in the ED for elbow pain. Workup thus far shows reassuring labs. US  showed mild fluid. CT is ordered. Joint aspiration completed.  Concerning findings are as following - none Important pending results are synovial fluid analysis and CT  According to Dr. Harless Lien, plan is to f/u on the workup, dispo per results.   Patient had no complains, no concerns from the nursing side. Will continue to monitor.  11:07 AM Patient reassessed after the synovial fluid results.  His inflammatory markers are reassuring.  I have independently interpreted CT scan, no evidence of significant effusion. Results of the fluid analysis reveals elevated WBCs of over 12,000.  There is positive neutrophilia.  I clinically assessed the patient.  He is able to range the right upper extremity.  He has clear evidence of erythema, warmth to touch and edema of the right elbow.  Range of motion limited due to the swelling, but patient is able to range actively and at least 60 degrees of the possible range of motion.  He is also able to pronate, supinate.  Very low suspicion for septic arthritis.  Concerns for bursitis at this time.  Message sent to Dr. Agatha Horsfall, with the hope for close follow-up by them.  Suspect septic bursitis and we will put him on antibiotics for it.  I spoke with Dr. Abigail Abler this morning, he recommends that patient call the clinic for a follow-up appointment this week.  Agrees with Keflex  for now.   Brandon Face, MD 03/04/23 1109    Brandon Face, MD 03/04/23 1126

## 2023-03-05 LAB — PROTEIN, BODY FLUID (OTHER): Total Protein, Body Fluid Other: 4.1 g/dL

## 2023-03-06 LAB — GLUCOSE, BODY FLUID OTHER

## 2023-03-07 LAB — BODY FLUID CULTURE W GRAM STAIN

## 2023-03-21 ENCOUNTER — Emergency Department (HOSPITAL_COMMUNITY)
Admission: EM | Admit: 2023-03-21 | Discharge: 2023-03-21 | Disposition: A | Discharge status: Home / Self Care | Payer: BC Managed Care – PPO

## 2023-03-21 ENCOUNTER — Other Ambulatory Visit: Payer: Self-pay

## 2023-03-21 ENCOUNTER — Encounter (HOSPITAL_COMMUNITY): Payer: Self-pay

## 2023-03-21 DIAGNOSIS — M25421 Effusion, right elbow: Secondary | ICD-10-CM | POA: Insufficient documentation

## 2023-03-21 DIAGNOSIS — L03311 Cellulitis of abdominal wall: Secondary | ICD-10-CM

## 2023-03-21 DIAGNOSIS — M25521 Pain in right elbow: Secondary | ICD-10-CM | POA: Insufficient documentation

## 2023-03-21 DIAGNOSIS — R21 Rash and other nonspecific skin eruption: Secondary | ICD-10-CM | POA: Insufficient documentation

## 2023-03-21 MED ORDER — OXYCODONE-ACETAMINOPHEN 5-325 MG PO TABS: 1.0000 | ORAL_TABLET | Freq: Four times a day (QID) | ORAL | 0 refills | Status: DC | PRN

## 2023-03-21 MED ORDER — CEPHALEXIN 500 MG PO CAPS: 500.0000 mg | ORAL_CAPSULE | Freq: Four times a day (QID) | ORAL | 0 refills | Status: DC

## 2023-03-21 MED ORDER — PREDNISONE 10 MG (21) PO TBPK: ORAL_TABLET | Freq: Every day | ORAL | 0 refills | Status: DC

## 2023-03-21 MED ORDER — OXYCODONE-ACETAMINOPHEN 5-325 MG PO TABS
1.0000 | ORAL_TABLET | Freq: Once | ORAL | Status: AC
  Administered 2023-03-21: 1 via ORAL
  Filled 2023-03-21: qty 1

## 2023-03-21 NOTE — ED Notes (Signed)
 Lower abd rash warm and red since yesterday.

## 2023-03-21 NOTE — Discharge Instructions (Signed)
 As discussed, please follow-up with your orthopedic doctor as soon as possible.  You may take over-the-counter Tylenol alternating with ibuprofen for baseline pain control.  Take Percocet for breakthrough pain.  We are prescribing you steroids.  It also appears that your rash anterior abdomen may be bacterial, we are prescribing you antibiotics.  Please take them as prescribed for the full course.  Return immediately if develop fevers, chills, severe pain, redness to elbow or abdomen worsens or spreads, or please return if you develop any new or worsening symptoms that are concerning to you.

## 2023-03-21 NOTE — ED Triage Notes (Signed)
 Pt arrived via POV for recurrent right elbow swelling, redness and pain. Pt seen here earlier this month and reports he did follow up with Ortho and took medication as prescribed, but reports no relief.

## 2023-03-21 NOTE — ED Provider Notes (Signed)
 [place] EMERGENCY DEPARTMENT AT [facility] Provider Note   CSN: [id number] Arrival date & time: 03/21/23  1833     History  Chief Complaint  Patient presents with   Joint Swelling    Right elbow    Brandon Miller is a 58 y.o. male.  This is a 58 year old male presenting emergency department for right elbow pain.  Was seen approximately month ago for similar symptoms.  Reportedly followed up with Ortho and was placed on prednisone and colchicine with some improvement of his symptoms.  His steroid and colchicine course ended a few days ago and his symptoms are seemingly returned.  Denies any fevers.  No numbness tingling changes in sensation.  He also notes he has not rash below his umbilicus roughly the size of his palm that is present for the past 24 hours or so.  Tried over the counter cortisone cream with little improvement.  Exam continues to be swollen, but largely complaining of pain        Home Medications Prior to Admission medications   Medication Sig Start Date End Date Taking? Authorizing Provider  amphetamine-dextroamphetamine (ADDERALL) 20 MG tablet Take 20 mg by mouth 2 (two) times daily. 09/14/20   [provider]  azithromycin (ZITHROMAX) 250 MG tablet Take 2 tablets together on the first day, then 1 every day until finished. 11/15/22   Sabas Sous, MD  B Complex-C (B-COMPLEX WITH VITAMIN C) tablet Take 1 tablet by mouth daily. 10/18/20   Karsten Ro, MD  benzocaine (ORAJEL) 10 % mucosal gel Use as directed in the mouth or throat 2 (two) times daily as needed for mouth pain (toothpain). 10/17/20 11/16/20  Karsten Ro, MD  cephALEXin (KEFLEX) 500 MG capsule Take 1 capsule (500 mg total) by mouth 4 (four) times daily. 03/04/23   Derwood Kaplan, MD  colchicine 0.6 MG tablet Take 1 tablet (0.6 mg total) by mouth 2 (two) times daily. 08/19/22   Eber Hong, MD  gabapentin (NEURONTIN) 400 MG capsule Take 1 capsule (400 mg total) by mouth 3  (three) times daily. 10/17/20 11/16/20  Karsten Ro, MD  mirtazapine (REMERON) 30 MG tablet Take 1 tablet (30 mg total) by mouth at bedtime. 10/17/20 11/16/20  Karsten Ro, MD  Multiple Vitamin (MULTIVITAMIN WITH MINERALS) TABS tablet Take 1 tablet by mouth daily. Patient not taking: No sig reported 05/18/20   Estella Husk, MD  Naphazoline HCl (CLEAR EYES OP) Apply 1 drop to eye daily as needed (dry red eyes).    [provider]  nicotine (NICODERM CQ - DOSED IN MG/24 HR) 7 mg/24hr patch Place 1 patch (7 mg total) onto the skin daily. 10/18/20   Karsten Ro, MD  omega-3 acid ethyl esters (LOVAZA) 1 g capsule Take 1 capsule (1 g total) by mouth 2 (two) times daily. 10/17/20   Karsten Ro, MD  oxyCODONE-acetaminophen (PERCOCET/ROXICET) 5-325 MG tablet Take 1 tablet by mouth every 8 (eight) hours as needed for severe pain (pain score 7-10). 03/04/23   Derwood Kaplan, MD  QUEtiapine (SEROQUEL) 25 MG tablet Take 1 tablet (25 mg total) by mouth at bedtime. 10/17/20 11/16/20  Karsten Ro, MD      Allergies    Daypro [oxaprozin], Diclofenac sodium, Indocin [indomethacin], and Zanaflex [tizanidine hcl]    Review of Systems   Review of Systems  Physical Exam Updated Vital Signs BP 139/89   Pulse 67   Temp 99 F (37.2 C) (Oral)   Resp 18   Ht  6\' 3"  (1.905 m)   Wt 102 kg   SpO2 98%   BMI 28.11 kg/m  Physical Exam Vitals and nursing note reviewed.  Constitutional:      General: He is not in acute distress.    Appearance: He is not toxic-appearing.  HENT:     Head: Normocephalic.     Nose: Nose normal.  Eyes:     Conjunctiva/sclera: Conjunctivae normal.  Cardiovascular:     Rate and Rhythm: Normal rate and regular rhythm.  Pulmonary:     Effort: Pulmonary effort is normal.  Abdominal:     General: Abdomen is flat. There is no distension.     Tenderness: There is no abdominal tenderness. There is no guarding or rebound.  Musculoskeletal:     Comments: Some global  swelling to the right elbow. Some very faint redness, but not overtly erythematous.  Able to range elbow almost full ROM.  Neurovascular intact.  2+ radial pulses.  Skin:    General: Skin is warm.     Capillary Refill: Capillary refill takes less than 2 seconds.     Findings: Rash present.     Comments: Patient has a palm sized patch of erythema just below his umbilicus.  No crepitus.  No fluctuance or induration.  Neurological:     Mental Status: He is alert and oriented to person, place, and time.  Psychiatric:        Mood and Affect: Mood normal.        Behavior: Behavior normal.     ED Results / Procedures / Treatments   Labs (all labs ordered are listed, but only abnormal results are displayed) Labs Reviewed - No data to display  EKG None  Radiology No results found.  Procedures Procedures    Medications Ordered in ED Medications  oxyCODONE-acetaminophen (PERCOCET/ROXICET) 5-325 MG per tablet 1 tablet (has no administration in time range)    ED Course/ Medical Decision Making/ A&P Clinical Course as of 03/21/23 1942  Thu Mar 21, 2023  1906 Seen in ED 2/9: "  11:07 AM Patient reassessed after the synovial fluid results.  His inflammatory markers are reassuring.  I have independently interpreted CT scan, no evidence of significant effusion. Results of the fluid analysis reveals elevated WBCs of over 12,000.  There is positive neutrophilia.   I clinically assessed the patient.  He is able to range the right upper extremity.  He has clear evidence of erythema, warmth to touch and edema of the right elbow.  Range of motion limited due to the swelling, but patient is able to range actively and at least 60 degrees of the possible range of motion.  He is also able to pronate, supinate.  Very low suspicion for septic arthritis.  Concerns for bursitis at this time.  Message sent to Dr. Dion Saucier, with the hope for close follow-up by them.  Suspect septic bursitis and we will put him  on antibiotics for it.   I spoke with Dr. Eulah Pont this morning, he recommends that patient call the clinic for a follow-up appointment this week.  Agrees with Keflex for now. " [TY]    Clinical Course User Index [TY] Coral Spikes, DO                                 Medical Decision Making 58 year old male presenting emergency department for elbow pain and swelling.  Afebrile nontachycardic hemodynamically stable.  His symptoms seemingly worsened after stopping prednisone and colchicine.  Low suspicion for septic arthritis given prior workup.  Will give Percocet here for pain.  Will represcribe steroids.  Does appear that he has a cellulitic type rash to his abdomen.  Will give course of antibiotics. Discussed follow-up with orthopedics; he has appointment early next week.  Stable for discharge at this time.  Risk Prescription drug management.         Final Clinical Impression(s) / ED Diagnoses Final diagnoses:  None    Rx / DC Orders ED Discharge Orders     None         Coral Spikes, DO 03/21/23 1942

## 2023-03-21 NOTE — ED Notes (Signed)
 This RN noted redness, warmth, and swelling on pt abdomen as well. Pt endorses no injury to this area.

## 2023-11-21 ENCOUNTER — Emergency Department (HOSPITAL_COMMUNITY)

## 2023-11-21 ENCOUNTER — Encounter (HOSPITAL_COMMUNITY): Payer: Self-pay | Admitting: Emergency Medicine

## 2023-11-21 ENCOUNTER — Other Ambulatory Visit: Payer: Self-pay

## 2023-11-21 ENCOUNTER — Emergency Department (HOSPITAL_COMMUNITY)
Admission: EM | Admit: 2023-11-21 | Discharge: 2023-11-21 | Disposition: A | Discharge status: Home / Self Care | Attending: Emergency Medicine | Admitting: Emergency Medicine

## 2023-11-21 DIAGNOSIS — E119 Type 2 diabetes mellitus without complications: Secondary | ICD-10-CM | POA: Diagnosis not present

## 2023-11-21 DIAGNOSIS — Z79899 Other long term (current) drug therapy: Secondary | ICD-10-CM | POA: Diagnosis not present

## 2023-11-21 DIAGNOSIS — F10129 Alcohol abuse with intoxication, unspecified: Secondary | ICD-10-CM | POA: Insufficient documentation

## 2023-11-21 DIAGNOSIS — M25511 Pain in right shoulder: Secondary | ICD-10-CM | POA: Insufficient documentation

## 2023-11-21 DIAGNOSIS — Z7984 Long term (current) use of oral hypoglycemic drugs: Secondary | ICD-10-CM | POA: Insufficient documentation

## 2023-11-21 DIAGNOSIS — S299XXA Unspecified injury of thorax, initial encounter: Secondary | ICD-10-CM

## 2023-11-21 LAB — CBC WITH DIFFERENTIAL/PLATELET
Abs Immature Granulocytes: 0.03 K/uL (ref 0.00–0.07)
Basophils Absolute: 0.1 K/uL (ref 0.0–0.1)
Basophils Relative: 1 %
Eosinophils Absolute: 0.2 K/uL (ref 0.0–0.5)
Eosinophils Relative: 2 %
HCT: 48.8 % (ref 39.0–52.0)
Hemoglobin: 17.4 g/dL — ABNORMAL HIGH (ref 13.0–17.0)
Immature Granulocytes: 0 %
Lymphocytes Relative: 39 %
Lymphs Abs: 3.2 K/uL (ref 0.7–4.0)
MCH: 32.1 pg (ref 26.0–34.0)
MCHC: 35.7 g/dL (ref 30.0–36.0)
MCV: 90 fL (ref 80.0–100.0)
Monocytes Absolute: 0.6 K/uL (ref 0.1–1.0)
Monocytes Relative: 7 %
Neutro Abs: 4.2 K/uL (ref 1.7–7.7)
Neutrophils Relative %: 51 %
Platelets: 159 K/uL (ref 150–400)
RBC: 5.42 MIL/uL (ref 4.22–5.81)
RDW: 13 % (ref 11.5–15.5)
WBC: 8.1 K/uL (ref 4.0–10.5)
nRBC: 0 % (ref 0.0–0.2)

## 2023-11-21 LAB — COMPREHENSIVE METABOLIC PANEL WITH GFR
ALT: 51 U/L — ABNORMAL HIGH (ref 0–44)
AST: 70 U/L — ABNORMAL HIGH (ref 15–41)
Albumin: 4.5 g/dL (ref 3.5–5.0)
Alkaline Phosphatase: 95 U/L (ref 38–126)
Anion gap: 16 — ABNORMAL HIGH (ref 5–15)
BUN: 10 mg/dL (ref 6–20)
CO2: 19 mmol/L — ABNORMAL LOW (ref 22–32)
Calcium: 8.8 mg/dL — ABNORMAL LOW (ref 8.9–10.3)
Chloride: 101 mmol/L (ref 98–111)
Creatinine, Ser: 1 mg/dL (ref 0.61–1.24)
GFR, Estimated: >60 mL/min (ref >60)
Glucose, Bld: 83 mg/dL (ref 70–99)
Potassium: 4.3 mmol/L (ref 3.5–5.1)
Sodium: 136 mmol/L (ref 135–145)
Total Bilirubin: 0.7 mg/dL (ref 0.0–1.2)
Total Protein: 7.6 g/dL (ref 6.5–8.1)

## 2023-11-21 MED ORDER — HYDROCODONE-ACETAMINOPHEN 5-325 MG PO TABS: ORAL_TABLET | ORAL | 0 refills | Status: DC

## 2023-11-21 MED ORDER — METHOCARBAMOL 500 MG PO TABS
500.0000 mg | ORAL_TABLET | Freq: Once | ORAL | Status: AC
  Administered 2023-11-21: 500 mg via ORAL
  Filled 2023-11-21: qty 1

## 2023-11-21 MED ORDER — OXYCODONE-ACETAMINOPHEN 5-325 MG PO TABS
1.0000 | ORAL_TABLET | Freq: Once | ORAL | Status: AC
  Administered 2023-11-21: 1 via ORAL
  Filled 2023-11-21: qty 1

## 2023-11-21 MED ORDER — MORPHINE SULFATE (PF) 4 MG/ML IV SOLN
4.0000 mg | Freq: Once | INTRAVENOUS | Status: AC
  Administered 2023-11-21: 4 mg via INTRAVENOUS
  Filled 2023-11-21: qty 1

## 2023-11-21 MED ORDER — LIDOCAINE 5 % EX PTCH
1.0000 | MEDICATED_PATCH | CUTANEOUS | Status: DC
  Administered 2023-11-21: 1 via TRANSDERMAL
  Filled 2023-11-21: qty 1

## 2023-11-21 MED ORDER — HYDROCODONE-ACETAMINOPHEN 5-325 MG PO TABS: 1.0000 | ORAL_TABLET | ORAL | 0 refills | Status: DC | PRN

## 2023-11-21 MED ORDER — MORPHINE SULFATE (PF) 2 MG/ML IV SOLN
2.0000 mg | Freq: Once | INTRAVENOUS | Status: AC
  Administered 2023-11-21: 2 mg via INTRAVENOUS
  Filled 2023-11-21: qty 1

## 2023-11-21 NOTE — ED Notes (Signed)
 Pt has been very tearful. Denies si/hi/avh but has made comments about eating a bullet or doing something to kill himself if he goes back home and we dont help him. Pt is currently weaning off an antidepressant and starting a new one with a dr per pt.

## 2023-11-21 NOTE — ED Notes (Signed)
 Taken to ct

## 2023-11-21 NOTE — ED Triage Notes (Signed)
 Pt called ems due to right shoulder pain from fall 6 days ago per pt's sister. Pt doesn't remember the fall. Pt is an alcoholic and intoxicated at this time. Pt c/o posterior upper right side back pain stating it is his shoulder. No obvious deformities noted. Radial pulse present.  Pt slightly tearful at times. States he is in process of switching antidepressant drugs.

## 2023-11-21 NOTE — Discharge Instructions (Addendum)
 You may try the over-the-counter 4% lidocaine  patches applied to your shoulder and upper back area.  Please take pain medication as directed.  Follow-up with your primary care provider or with your orthopedic provider for recheck

## 2023-11-21 NOTE — ED Provider Notes (Signed)
   Patient signed out to me by Sherran Barks, PA-C pending review of CT imaging.   Patient here with complaint of right shoulder right upper back pain for 6 days.  States he has been staying at his sister's house and sister states that he fell and has been having shoulder pain since.  Pain with movement of his right arm.  Patient admits that he was intoxicated at the time and does not recall a fall.  Denies any headache, dizziness visual changes numbness or weakness of his extremities.  See previous provider note for complete H&P.  On my exam, patient does have pain reproducible with range of motion of the right shoulder, and palpation of the right rhomboid muscles.  He has full range of motion of the neck and cervical spine nontender.  His grip strengths are strong and symmetrical bilaterally.  He does have some bruising to the right posterior rib area.  There is no significant tenderness or guarding to this area.  Per previous provider, patient had mentioned that patient stated was going to put a bullet to his head if someone did not give him medication for his pain.  On my exam, patient admits to saying this earlier but states that his level of pain was severe at the time and that he does not intend to harm himself or anyone else.  He has been cooperative during my exam and pain improved after medication here.  He continues to deny any homicidal or suicidal thoughts or plan.   Discussed imaging results with patient.  Agreeable to symptomatic treatment.  He prefers orthopedic follow-up with [facility] as he has seen them previously.  I have also recommended outpatient follow-up with his PCP if needed.     Herlinda Milling, PA-C 11/21/23 2243    Yolande Lamar BROCKS, MD 11/23/23 [phone]

## 2023-11-21 NOTE — ED Provider Notes (Signed)
 [name] EMERGENCY DEPARTMENT AT [facility] Provider Note   CSN: [id number] Arrival date & time: 11/21/23  1648     Patient presents with: Shoulder Pain   Brandon Miller is a 58 y.o. male.  History of diabetes, alcohol abuse depression, alcohol withdrawal seizures.  Presents ER today complaining of right posterior shoulder pain.  He states his sister told him he fell down several days ago but he does not remember this as he was intoxicated.  He states the pain is so severe and he is worried that it could be dislocated.  He denies numbness or tingling, no weakness.  Pain is rated 10 out of 10 the pain scale.    Shoulder Pain      Prior to Admission medications   Medication Sig Start Date End Date Taking? Authorizing Provider  amphetamine -dextroamphetamine  (ADDERALL) 20 MG tablet Take 20 mg by mouth 2 (two) times daily. 09/14/20   [provider]  azithromycin  (ZITHROMAX ) 250 MG tablet Take 2 tablets together on the first day, then 1 every day until finished. 11/15/22   Theadore Ozell HERO, MD  B Complex-C (B-COMPLEX WITH VITAMIN C) tablet Take 1 tablet by mouth daily. 10/18/20   Doda, Vandana, MD  benzocaine  (ORAJEL) 10 % mucosal gel Use as directed in the mouth or throat 2 (two) times daily as needed for mouth pain (toothpain). 10/17/20 11/16/20  Doda, Vandana, MD  buPROPion  (WELLBUTRIN  XL) 150 MG 24 hr tablet Take 150 mg by mouth every morning. 11/13/23   [provider]  cephALEXin  (KEFLEX ) 500 MG capsule Take 1 capsule (500 mg total) by mouth 4 (four) times daily. 03/21/23   Neysa Caron PARAS, DO  colchicine  0.6 MG tablet Take 1 tablet (0.6 mg total) by mouth 2 (two) times daily. 08/19/22   Cleotilde Rogue, MD  gabapentin  (NEURONTIN ) 600 MG tablet Take 600 mg by mouth 3 (three) times daily.    [provider]  methylPREDNISolone  (MEDROL  DOSEPAK) 4 MG TBPK tablet Take 4 mg by mouth See admin instructions. See package 11/06/23   [provider]   mirtazapine  (REMERON ) 30 MG tablet Take 1 tablet (30 mg total) by mouth at bedtime. 10/17/20 11/21/26  Doda, Vandana, MD  mirtazapine  (REMERON ) 45 MG tablet Take 45 mg by mouth at bedtime. 09/27/23   [provider]  Multiple Vitamin (MULTIVITAMIN WITH MINERALS) TABS tablet Take 1 tablet by mouth daily. Patient not taking: No sig reported 05/18/20   Hansel Comer RAMAN, MD  Naphazoline HCl (CLEAR EYES OP) Apply 1 drop to eye daily as needed (dry red eyes).    [provider]  nicotine  (NICODERM CQ  - DOSED IN MG/24 HR) 7 mg/24hr patch Place 1 patch (7 mg total) onto the skin daily. 10/18/20   Doda, Vandana, MD  omega-3 acid ethyl esters (LOVAZA ) 1 g capsule Take 1 capsule (1 g total) by mouth 2 (two) times daily. 10/17/20   Doda, Vandana, MD  oxyCODONE -acetaminophen  (PERCOCET/ROXICET) 5-325 MG tablet Take 1 tablet by mouth every 6 (six) hours as needed for severe pain (pain score 7-10). 03/21/23   Neysa Caron PARAS, DO  predniSONE  (STERAPRED UNI-PAK 21 TAB) 10 MG (21) TBPK tablet Take by mouth daily. Take 6 tabs by mouth daily  for 2 days, then 5 tabs for 2 days, then 4 tabs for 2 days, then 3 tabs for 2 days, 2 tabs for 2 days, then 1 tab by mouth daily for 2 days 03/21/23   Neysa Caron PARAS, DO  QUEtiapine  (SEROQUEL ) 25 MG tablet Take 1 tablet (25 mg total) by mouth at bedtime. 10/17/20 11/16/20  Doda, Vandana, MD    Allergies: Tizanidine hcl, Allopurinol , Indomethacin, Daypro [oxaprozin], and Diclofenac sodium    Review of Systems  Updated Vital Signs BP (!) 167/105   Pulse 71   Temp (!) 97.5 F (36.4 C) (Oral)   Resp 20   SpO2 98%   Physical Exam Vitals and nursing note reviewed.  Constitutional:      General: He is not in acute distress.    Appearance: He is well-developed.  HENT:     Head: Normocephalic and atraumatic.  Eyes:     Conjunctiva/sclera: Conjunctivae normal.  Cardiovascular:     Rate and Rhythm: Normal rate and regular rhythm.     Heart sounds: No murmur  heard. Pulmonary:     Effort: Pulmonary effort is normal. No respiratory distress.     Breath sounds: Normal breath sounds.  Abdominal:     Palpations: Abdomen is soft.     Tenderness: There is no abdominal tenderness.  Musculoskeletal:        General: No swelling.     Cervical back: Neck supple.     Comments: Oozing noted to right scapular area with tenderness of the entire right trapezius and scapular area.  No tenderness to the [facility] joint.  Patient is able to raise shoulder to about 45 to 50 degrees, beyond that limited to pain.  No tenderness of the elbow or wrist, right radial pulses intact.  Sensation intact.  There is mild tenderness to the neck in the midline.  Skin:    General: Skin is warm and dry.     Capillary Refill: Capillary refill takes less than 2 seconds.  Neurological:     General: No focal deficit present.     Mental Status: He is alert and oriented to person, place, and time.  Psychiatric:        Mood and Affect: Mood normal.     (all labs ordered are listed, but only abnormal results are displayed) Labs Reviewed - No data to display  EKG: None  Radiology: DG Shoulder Right Result Date: 11/21/2023 CLINICAL DATA:  Right shoulder pain.  Fall 6 days ago. EXAM: RIGHT SHOULDER - 2+ VIEW COMPARISON:  Humerus CT 03/04/2023 FINDINGS: There is no evidence of fracture or dislocation. Mild glenohumeral and acromioclavicular degenerative change. Soft tissues are unremarkable. IMPRESSION: No fracture or dislocation of the right shoulder. Mild degenerative change. Electronically Signed   By: Andrea Gasman M.D.   On: 11/21/2023 18:28     Procedures   Medications Ordered in the ED  oxyCODONE -acetaminophen  (PERCOCET/ROXICET) 5-325 MG per tablet 1 tablet (1 tablet Oral Given 11/21/23 1756)    Clinical Course as of 11/21/23 1905  Thu Nov 21, 2023  1902 Patient had told tech in the hallway that if he did not get pain medicine soon he was going to put a bullet in his  head.  I discussed this with the patient, he states he was not being serious, he just wants us  to understand how severe his pain was and he felt like he was not evaluated quickly enough and he wanted to make sure he was going to be treated.  He denies suicidal ideations adamantly but states he just wants his pain to be treated as it is extremely uncomfortable. [CB]    Clinical Course User Index [CB] Suellen Cantor A, PA-C  Medical Decision Making Differential diagnosis includes but limited to fracture, dislocation, sprain, strain, contusion, other  ED course: Patient was initially not cooperative for exam stating he was in too much pain to move much, did not have any obvious deformities and had intact pulse, patient was given pain medicine x-rays obtained which were negative, I reevaluated patient, he states pain is minimally improved but he was able to allow me to examine him further.  He does have bruising to the scapular area on the right side, he is very tender in this area.  I discussed that we can get CT scan to rule out fracture of his scapula or ribs and give some additional pain medicine.  Will get basic labs as well.  Patient was initially tearful but not suicidal or homicidal, he had made a threat that he was going to shoot himself, but immediately retracted this stating he was just trying to make the point of how much pain he was in.  Patient is no longer tearful, he is cooperative with my exam, do not feel he needs IVC, imaging is pending, signed out to [employer], PA-C  Amount and/or Complexity of Data Reviewed Labs: ordered. Radiology: ordered.  Risk Prescription drug management.        Final diagnoses:  None    ED Discharge Orders     None          Suellen Sherran DELENA DEVONNA 11/21/23 1906    Yolande Lamar BROCKS, MD 11/23/23 0021

## 2023-11-22 MED FILL — Hydrocodone-Acetaminophen Tab 5-325 MG: ORAL | Qty: 6 | Status: AC

## 2023-11-27 ENCOUNTER — Other Ambulatory Visit: Payer: Self-pay

## 2023-11-27 ENCOUNTER — Emergency Department (HOSPITAL_BASED_OUTPATIENT_CLINIC_OR_DEPARTMENT_OTHER): Admission: EM | Admit: 2023-11-27 | Discharge: 2023-11-27 | Disposition: A | Discharge status: Home / Self Care

## 2023-11-27 DIAGNOSIS — E119 Type 2 diabetes mellitus without complications: Secondary | ICD-10-CM | POA: Insufficient documentation

## 2023-11-27 DIAGNOSIS — M542 Cervicalgia: Secondary | ICD-10-CM | POA: Insufficient documentation

## 2023-11-27 MED ORDER — LIDOCAINE 5 % EX PTCH
2.0000 | MEDICATED_PATCH | CUTANEOUS | Status: DC
  Administered 2023-11-27: 2 via TRANSDERMAL
  Filled 2023-11-27: qty 2

## 2023-11-27 MED ORDER — DIAZEPAM 5 MG PO TABS
5.0000 mg | ORAL_TABLET | Freq: Once | ORAL | Status: AC
Start: 2023-11-27 — End: 2023-11-27
  Administered 2023-11-27: 5 mg via ORAL
  Filled 2023-11-27: qty 1

## 2023-11-27 MED ORDER — NAPROXEN 500 MG PO TABS: 500.0000 mg | ORAL_TABLET | Freq: Two times a day (BID) | ORAL | 0 refills | Status: DC

## 2023-11-27 MED ORDER — KETOROLAC TROMETHAMINE 15 MG/ML IJ SOLN
15.0000 mg | Freq: Once | INTRAMUSCULAR | Status: AC
  Administered 2023-11-27: 15 mg via INTRAMUSCULAR
  Filled 2023-11-27: qty 1

## 2023-11-27 MED ORDER — LIDOCAINE 5 % EX PTCH: 1.0000 | MEDICATED_PATCH | CUTANEOUS | 0 refills | Status: DC

## 2023-11-27 MED ORDER — DIAZEPAM 5 MG PO TABS: 2.5000 mg | ORAL_TABLET | Freq: Two times a day (BID) | ORAL | 0 refills | Status: AC | PRN

## 2023-11-27 NOTE — Discharge Instructions (Addendum)
 Monday follow-up with the neurosurgeon.  You need to call to schedule appointment.  Number is listed on discharge paperwork.  In the meantime I would recommend alternating Tylenol  and Naproxen for pain control.  Proximal and is an anti-inflammatory so please take with food on your stomach. Try lidocaine  patch over area of pain.  Take Valium once daily as needed for muscle spasm.  Please be aware that this can cause some sedation so do not drive or operate heavy machinery when taking it.

## 2023-11-27 NOTE — ED Provider Notes (Signed)
 [place] EMERGENCY DEPARTMENT AT [facility] Provider Note   CSN: [id number] Arrival date & time: 11/27/23  8073     Patient presents with: Neck Pain   Brandon Miller is a 58 y.o. male.  Patient with past medical history of diabetes, seizure, alcohol abuse, GERD presents to emergency room with complaint of neck pain.  Patient reports that approximately 2 weeks ago he had a fall while intoxicated injuring his neck.  He reports that he was seen in the ER for this and followed up with orthopedics twice and has not had any significant improvement in symptoms.  He locates his pain to the upper thoracic spine radiating over to right shoulder.  He has no numbness tingling or weakness of upper extremities.  No fever.  Denies history of IV drug use.    Neck Pain      Prior to Admission medications   Medication Sig Start Date End Date Taking? Authorizing Provider  diazepam (VALIUM) 5 MG tablet Take 0.5 tablets (2.5 mg total) by mouth every 12 (twelve) hours as needed for anxiety. 11/27/23  Yes Biridiana Twardowski, Warren SAILOR, PA-C  lidocaine  (LIDODERM ) 5 % Place 1 patch onto the skin daily. Remove & Discard patch within 12 hours or as directed by MD 11/27/23  Yes Terralyn Matsumura N, PA-C  naproxen (NAPROSYN) 500 MG tablet Take 1 tablet (500 mg total) by mouth 2 (two) times daily. 11/27/23  Yes Yacqub Baston, Warren SAILOR, PA-C  amphetamine -dextroamphetamine  (ADDERALL) 20 MG tablet Take 20 mg by mouth 2 (two) times daily. 09/14/20   [provider]  azithromycin  (ZITHROMAX ) 250 MG tablet Take 2 tablets together on the first day, then 1 every day until finished. 11/15/22   Theadore Ozell HERO, MD  B Complex-C (B-COMPLEX WITH VITAMIN C) tablet Take 1 tablet by mouth daily. 10/18/20   Doda, Vandana, MD  benzocaine  (ORAJEL) 10 % mucosal gel Use as directed in the mouth or throat 2 (two) times daily as needed for mouth pain (toothpain). 10/17/20 11/16/20  Doda, Vandana, MD  buPROPion  (WELLBUTRIN  XL) 150 MG 24 hr  tablet Take 150 mg by mouth every morning. 11/13/23   [provider]  cephALEXin  (KEFLEX ) 500 MG capsule Take 1 capsule (500 mg total) by mouth 4 (four) times daily. 03/21/23   Neysa Caron PARAS, DO  colchicine  0.6 MG tablet Take 1 tablet (0.6 mg total) by mouth 2 (two) times daily. 08/19/22   Cleotilde Rogue, MD  gabapentin  (NEURONTIN ) 600 MG tablet Take 600 mg by mouth 3 (three) times daily.    [provider]  HYDROcodone -acetaminophen  (NORCO/VICODIN) 5-325 MG tablet Take 1 tablet by mouth every 4 (four) hours as needed. 11/21/23   Triplett, Tammy, PA-C  HYDROcodone -acetaminophen  (NORCO/VICODIN) 5-325 MG tablet Take one tab po q 4 hrs prn pain 11/21/23   Triplett, Tammy, PA-C  methylPREDNISolone  (MEDROL  DOSEPAK) 4 MG TBPK tablet Take 4 mg by mouth See admin instructions. See package 11/06/23   [provider]  mirtazapine  (REMERON ) 30 MG tablet Take 1 tablet (30 mg total) by mouth at bedtime. 10/17/20 11/21/26  Doda, Vandana, MD  mirtazapine  (REMERON ) 45 MG tablet Take 45 mg by mouth at bedtime. 09/27/23   [provider]  Multiple Vitamin (MULTIVITAMIN WITH MINERALS) TABS tablet Take 1 tablet by mouth daily. Patient not taking: No sig reported 05/18/20   Hansel Comer RAMAN, MD  Naphazoline HCl (CLEAR EYES OP) Apply 1 drop to eye daily as needed (dry red eyes).    [provider]  nicotine  (NICODERM CQ  - DOSED IN MG/24 HR) 7 mg/24hr patch Place 1 patch (7 mg total) onto the skin daily. 10/18/20   Doda, Vandana, MD  omega-3 acid ethyl esters (LOVAZA ) 1 g capsule Take 1 capsule (1 g total) by mouth 2 (two) times daily. 10/17/20   Doda, Vandana, MD  predniSONE  (STERAPRED UNI-PAK 21 TAB) 10 MG (21) TBPK tablet Take by mouth daily. Take 6 tabs by mouth daily  for 2 days, then 5 tabs for 2 days, then 4 tabs for 2 days, then 3 tabs for 2 days, 2 tabs for 2 days, then 1 tab by mouth daily for 2 days 03/21/23   Neysa Caron PARAS, DO  QUEtiapine  (SEROQUEL ) 25 MG tablet Take 1  tablet (25 mg total) by mouth at bedtime. 10/17/20 11/16/20  Doda, Vandana, MD    Allergies: Tizanidine hcl, Allopurinol , Indomethacin, Prednisone , Daypro [oxaprozin], and Diclofenac sodium    Review of Systems  Musculoskeletal:  Positive for neck pain.    Updated Vital Signs BP (!) 142/98   Pulse 63   Temp 98.8 F (37.1 C)   Resp 18   Ht 6' 3 (1.905 m)   Wt 104.3 kg   SpO2 99%   BMI 28.75 kg/m   Physical Exam Vitals and nursing note reviewed.  Constitutional:      General: He is not in acute distress.    Appearance: He is not toxic-appearing.  HENT:     Head: Normocephalic and atraumatic.  Eyes:     General: No scleral icterus.    Conjunctiva/sclera: Conjunctivae normal.  Neck:     Comments: No cervical midline tenderness, step-off or deformity.  No thoracic spine step-off deformity or midline tenderness. Cardiovascular:     Rate and Rhythm: Normal rate and regular rhythm.     Pulses: Normal pulses.     Heart sounds: Normal heart sounds.  Pulmonary:     Effort: Pulmonary effort is normal. No respiratory distress.     Breath sounds: Normal breath sounds.  Abdominal:     General: Abdomen is flat. Bowel sounds are normal.     Palpations: Abdomen is soft.     Tenderness: There is no abdominal tenderness.  Musculoskeletal:       Arms:  Skin:    General: Skin is warm and dry.     Findings: No lesion.  Neurological:     General: No focal deficit present.     Mental Status: He is alert and oriented to person, place, and time. Mental status is at baseline.     (all labs ordered are listed, but only abnormal results are displayed) Labs Reviewed - No data to display  EKG: None  Radiology: No results found.   Procedures   Medications Ordered in the ED  lidocaine  (LIDODERM ) 5 % 2 patch (2 patches Transdermal Patch Applied 11/27/23 2054)  diazepam (VALIUM) tablet 5 mg (5 mg Oral Given 11/27/23 2053)  ketorolac  (TORADOL ) 15 MG/ML injection 15 mg (15 mg  Intramuscular Given 11/27/23 2053)                                    Medical Decision Making Risk Prescription drug management.   This patient presents to the ED for concern of low back pain, this involves an extensive number of treatment options, and is a complaint that carries with it a high risk of complications and morbidity.  The differential diagnosis  includes MSK in nature, fracture, epidural hematoma/abscess, cauda equina syndrome, spinal stenosis, spinal malignancy, discitis, spinal infection, spondylitises/ spondylosis, conus medullaris, DDD of the back.   Imaging Studies ordered:  Reviewed patient's imaging 11/21/2023 in which she had CT chest and CT cervical spine as well as x-ray of right shoulder performed showing no acute findings. Does show mild degenerative changes of right shoulder   Cardiac Monitoring: / EKG:  The patient was maintained on a cardiac monitor.     Problem List / ED Course / Critical interventions / Medication management  Patient is presenting with right upper back pain that has been ongoing since a fall approximately 2 weeks ago.  On my exam he has no focal weakness and he is sensation is intact.  He has no midline tenderness.  He has not had any new change in symptoms since he was already evaluated in ER for this approximately 7 days ago and he has already seen orthopedics twice. He is asking for better pain control.  Rule out ddx including abscess, cauda equina syndrome, malignancy less likely given history of present illness. No red flag symptoms indication need for emergent MR at this time.   I ordered medication including Valium, lidocaine  patch. Reevaluation of the patient after these medicines showed that the patient improved I have reviewed the patients home medicines and have made adjustments as needed. Patient has been hemodynamically stable and well-appearing.  Patient has no red flag symptoms.  Has already had workup and imaging for this  and see specialist.  Could consider MRI in our patient setting if this continues. I did treat pain and he had significant improvement in symptoms.  Feel stable for discharge with outpatient follow-up.        Final diagnoses:  Neck pain    ED Discharge Orders          Ordered    diazepam (VALIUM) 5 MG tablet  Every 12 hours PRN        11/27/23 2139    lidocaine  (LIDODERM ) 5 %  Every 24 hours        11/27/23 2139    naproxen (NAPROSYN) 500 MG tablet  2 times daily        11/27/23 2139               Adley Castello N, PA-C 11/27/23 2159    Ula Prentice SAUNDERS, MD 11/28/23 1929

## 2023-11-27 NOTE — ED Notes (Signed)
Patient verbalizes understanding of discharge instructions. Opportunity for questioning and answers were provided. Armband removed by staff, pt discharged from ED. Ambulated out to lobby with wife  

## 2023-11-27 NOTE — ED Triage Notes (Signed)
 Pt POV reporting persistent neck pain, fell two weeks ago, seen by ortho x2, given pain meds without improvement.

## 2023-11-29 ENCOUNTER — Emergency Department (HOSPITAL_COMMUNITY)
Admission: EM | Admit: 2023-11-29 | Discharge: 2023-11-29 | Disposition: A | Discharge status: Home / Self Care | Attending: Emergency Medicine | Admitting: Emergency Medicine

## 2023-11-29 ENCOUNTER — Other Ambulatory Visit: Payer: Self-pay

## 2023-11-29 ENCOUNTER — Encounter (HOSPITAL_COMMUNITY): Payer: Self-pay | Admitting: Emergency Medicine

## 2023-11-29 DIAGNOSIS — F17210 Nicotine dependence, cigarettes, uncomplicated: Secondary | ICD-10-CM | POA: Insufficient documentation

## 2023-11-29 DIAGNOSIS — E119 Type 2 diabetes mellitus without complications: Secondary | ICD-10-CM | POA: Insufficient documentation

## 2023-11-29 DIAGNOSIS — M542 Cervicalgia: Secondary | ICD-10-CM | POA: Diagnosis present

## 2023-11-29 DIAGNOSIS — M5412 Radiculopathy, cervical region: Secondary | ICD-10-CM | POA: Diagnosis not present

## 2023-11-29 NOTE — ED Provider Notes (Addendum)
 [place] EMERGENCY DEPARTMENT AT [facility] Provider Note  CSN: [id number] Arrival date & time: 11/29/23 1842  Chief Complaint(s) Torticollis  HPI Brandon Miller is a 58 y.o. male history of diabetes, presenting to the emergency department with neck pain.  Patient reports he has had neck pain for about a week.  Reports that it began after he fell when was going into a bathroom at his sister's house.  Initially felt it was shoulder but then developed some neck pain that radiates down his arm.  He reports that he went to the ER initially, and followed up with his orthopedic provider.  Orthopedic provider performed some steroid injections around the shoulder and neck which did not seem to help much.  He reports that he was going to follow-up with them again but they told him to go to the ER instead, he is not quite sure why.  He reports frustration at this.  He went to other ER earlier this morning where he was felt to have a reassuring exam and was discharged.  He returns because he is not sure if he is supposed to have some type of scan.  He reports he is taking his pain medication as scheduled and not having much relief.  He denies any headaches, new trauma, reports some tingling and numbness going down the right arm, some subjective weakness.  Denies any chest pain, shortness of breath.  Denies any fevers or chills.  Denies any history of IV drug use, denies any back pain, denies any other new symptoms.  He reports he is here mainly because he is having persistent pain.   Past Medical History Past Medical History:  Diagnosis Date   Allergy    Anemia    Anxiety    Arthritis    RA   Complication of anesthesia    hard to wake up-was told he may have sleep apnea-never tested   Depression    Diabetes mellitus without complication (HCC)    with weight no more problems   Seizure (HCC)    had one seizure of unknown etiology a few tears ago, none since and on no meds for them    Sleep apnea    Snores    Patient Active Problem List   Diagnosis Date Noted   Cognitive complaints 10/11/2020   Abnormal thyroid  screen (blood) 05/23/2020   Thrombocytopenia 05/23/2020   History of seizures 05/18/2020   Alcohol-induced mood disorder with depressive symptoms (HCC) 05/18/2020   Substance induced mood disorder (HCC) 05/17/2020   Subdural hematoma (HCC) 02/03/2020   Arthritis 01/09/2020   Gout 01/09/2020   Type II diabetes mellitus, uncontrolled 01/09/2020   Falls 08/10/2019   Numbness and tingling of upper and lower extremities of both sides 08/10/2019   Balance problem 08/10/2019   Medication side effect 08/10/2019   Alcohol withdrawal (HCC) 08/10/2019   GERD (gastroesophageal reflux disease) 02/06/2019   Attention deficit hyperactivity disorder (ADHD) 11/17/2018   Tobacco dependence 11/17/2018   Alcohol dependence (HCC) 11/17/2018   History of diet-controlled diabetes 11/17/2018   Elevated LFTs 11/05/2018   Acute gastroenteritis 10/31/2018   Sleep apnea    Generalized abdominal pain    Non-intractable vomiting    Diarrhea    ETOH abuse    Ileus, unspecified (HCC) 10/29/2018   Severe recurrent major depression without psychotic features (HCC) 08/15/2018   Alcohol withdrawal seizure with perceptual disturbance (HCC)    Alcohol abuse with unspecified alcohol-induced disorder 02/05/2018   Transaminasemia 02/05/2018  Anxiety and depression    Alcohol withdrawal seizure without complication (HCC) 02/04/2018   DJD (degenerative joint disease) of knee 08/25/2014   Diabetes mellitus, type 2 (HCC) 10/31/2011   Hyperglycemia 10/31/2011   PNA (pneumonia) 10/30/2011   LEG PAIN, RIGHT 01/20/2010   Home Medication(s) Prior to Admission medications   Medication Sig Start Date End Date Taking? Authorizing Provider  amphetamine -dextroamphetamine  (ADDERALL) 20 MG tablet Take 20 mg by mouth 2 (two) times daily. 09/14/20   [provider]  azithromycin  (ZITHROMAX )  250 MG tablet Take 2 tablets together on the first day, then 1 every day until finished. 11/15/22   Theadore Ozell HERO, MD  B Complex-C (B-COMPLEX WITH VITAMIN C) tablet Take 1 tablet by mouth daily. 10/18/20   Doda, Vandana, MD  benzocaine  (ORAJEL) 10 % mucosal gel Use as directed in the mouth or throat 2 (two) times daily as needed for mouth pain (toothpain). 10/17/20 11/16/20  Doda, Vandana, MD  buPROPion  (WELLBUTRIN  XL) 150 MG 24 hr tablet Take 150 mg by mouth every morning. 11/13/23   [provider]  cephALEXin  (KEFLEX ) 500 MG capsule Take 1 capsule (500 mg total) by mouth 4 (four) times daily. 03/21/23   Neysa Caron PARAS, DO  colchicine  0.6 MG tablet Take 1 tablet (0.6 mg total) by mouth 2 (two) times daily. 08/19/22   Cleotilde Rogue, MD  diazepam (VALIUM) 5 MG tablet Take 0.5 tablets (2.5 mg total) by mouth every 12 (twelve) hours as needed for anxiety. 11/27/23   Barrett, Warren SAILOR, PA-C  gabapentin  (NEURONTIN ) 600 MG tablet Take 600 mg by mouth 3 (three) times daily.    [provider]  HYDROcodone -acetaminophen  (NORCO/VICODIN) 5-325 MG tablet Take 1 tablet by mouth every 4 (four) hours as needed. 11/21/23   Triplett, Tammy, PA-C  HYDROcodone -acetaminophen  (NORCO/VICODIN) 5-325 MG tablet Take one tab po q 4 hrs prn pain 11/21/23   Triplett, Tammy, PA-C  lidocaine  (LIDODERM ) 5 % Place 1 patch onto the skin daily. Remove & Discard patch within 12 hours or as directed by MD 11/27/23   Barrett, Warren SAILOR, PA-C  methylPREDNISolone  (MEDROL  DOSEPAK) 4 MG TBPK tablet Take 4 mg by mouth See admin instructions. See package 11/06/23   [provider]  mirtazapine  (REMERON ) 30 MG tablet Take 1 tablet (30 mg total) by mouth at bedtime. 10/17/20 11/21/26  Doda, Vandana, MD  mirtazapine  (REMERON ) 45 MG tablet Take 45 mg by mouth at bedtime. 09/27/23   [provider]  Multiple Vitamin (MULTIVITAMIN WITH MINERALS) TABS tablet Take 1 tablet by mouth daily. Patient not taking: No sig  reported 05/18/20   Hansel Comer RAMAN, MD  Naphazoline HCl (CLEAR EYES OP) Apply 1 drop to eye daily as needed (dry red eyes).    [provider]  naproxen (NAPROSYN) 500 MG tablet Take 1 tablet (500 mg total) by mouth 2 (two) times daily. 11/27/23   Barrett, Jamie N, PA-C  nicotine  (NICODERM CQ  - DOSED IN MG/24 HR) 7 mg/24hr patch Place 1 patch (7 mg total) onto the skin daily. 10/18/20   Doda, Vandana, MD  omega-3 acid ethyl esters (LOVAZA ) 1 g capsule Take 1 capsule (1 g total) by mouth 2 (two) times daily. 10/17/20   Doda, Vandana, MD  predniSONE  (STERAPRED UNI-PAK 21 TAB) 10 MG (21) TBPK tablet Take by mouth daily. Take 6 tabs by mouth daily  for 2 days, then 5 tabs for 2 days, then 4 tabs for 2 days, then 3 tabs for 2 days, 2 tabs  for 2 days, then 1 tab by mouth daily for 2 days 03/21/23   Neysa Caron PARAS, DO  QUEtiapine  (SEROQUEL ) 25 MG tablet Take 1 tablet (25 mg total) by mouth at bedtime. 10/17/20 11/16/20  Penne Lovelace, MD                                                                                                                                    Past Surgical History Past Surgical History:  Procedure Laterality Date   BIOPSY  02/12/2019   Procedure: BIOPSY;  Surgeon: Shaaron Lamar HERO, MD;  Location: [facility];  Service: Endoscopy;;  stomach   COLONOSCOPY WITH PROPOFOL  N/A 02/12/2019   Procedure: COLONOSCOPY WITH PROPOFOL ;  Surgeon: Shaaron Lamar HERO, MD;  Location: [facility];  Service: Endoscopy;  Laterality: N/A;  10:30am   ELBOW SURGERY Right 2010   Dr. Beverley   ESOPHAGOGASTRODUODENOSCOPY (EGD) WITH PROPOFOL  N/A 02/12/2019   Procedure: ESOPHAGOGASTRODUODENOSCOPY (EGD) WITH PROPOFOL ;  Surgeon: Shaaron Lamar HERO, MD;  Location: [facility];  Service: Endoscopy;  Laterality: N/A;   HERNIA REPAIR     Right inguinal   KNEE ARTHROSCOPY WITH MEDIAL MENISECTOMY Right 06/11/2013   Procedure: RIGHT KNEE ARTHROSCOPY WITH MEDIAL MENISECTOMY AND CHONDROPLASTY;  Surgeon: Toribio JULIANNA Beverley, MD;  Location: [facility];  Service: Orthopedics;  Laterality: Right;  Chondroplasty, loose bodies, medial menisectomy, lysis of adhesions.   KNEE SURGERY Right 2011   POLYPECTOMY  02/12/2019   Procedure: POLYPECTOMY;  Surgeon: Shaaron Lamar HERO, MD;  Location: [facility];  Service: Endoscopy;;   right ankle     SHOULDER SURGERY Right 2010   Dr. Beverley   TOTAL KNEE ARTHROPLASTY Right 08/25/2014   Procedure: TOTAL KNEE ARTHROPLASTY;  Surgeon: Toribio JULIANNA Beverley, MD;  Location: [facility] OR;  Service: Orthopedics;  Laterality: Right;   Family History Family History  Problem Relation Age of Onset   Hepatitis Mother    Cirrhosis Mother        Did not drink   Pneumonia Father    Kidney failure Father    Gastric cancer Brother        ? Gastric CA spread to esophagus   Colon cancer Neg Hx     Social History Social History   Tobacco Use   Smoking status: Every Day    Current packs/day: 1.50    Average packs/day: 1.5 packs/day for 25.0 years (37.5 ttl pk-yrs)    Types: Cigarettes   Smokeless tobacco: Never  Vaping Use   Vaping status: Never Used  Substance Use Topics   Alcohol use: Yes    Comment: beer daily   Drug use: No   Allergies Tizanidine hcl, Allopurinol , Indomethacin, Prednisone , Daypro [oxaprozin], and Diclofenac sodium  Review of Systems Review of Systems  All other systems reviewed and are negative.   Physical Exam Vital Signs  I have reviewed the triage vital signs BP (!) 157/90  Pulse 89   Temp 98.5 F (36.9 C) (Oral)   Resp 19   Ht 6' 3 (1.905 m)   Wt 104.3 kg   SpO2 96%   BMI 28.75 kg/m  Physical Exam Vitals and nursing note reviewed.  Constitutional:      General: He is not in acute distress.    Appearance: Normal appearance.  HENT:     Mouth/Throat:     Mouth: Mucous membranes are moist.  Eyes:     Conjunctiva/sclera: Conjunctivae normal.  Cardiovascular:     Rate and Rhythm: Normal rate and regular rhythm.     Pulses:           Radial pulses are 2+ on the right side and 2+ on the left side.  Pulmonary:     Effort: Pulmonary effort is normal. No respiratory distress.     Breath sounds: Normal breath sounds.  Abdominal:     General: Abdomen is flat.     Palpations: Abdomen is soft.     Tenderness: There is no abdominal tenderness.  Musculoskeletal:     Cervical back: Normal range of motion and neck supple. No rigidity or tenderness.     Right lower leg: No edema.     Left lower leg: No edema.  Skin:    General: Skin is warm and dry.     Capillary Refill: Capillary refill takes less than 2 seconds.  Neurological:     Mental Status: He is alert and oriented to person, place, and time. Mental status is at baseline.     Comments: Strength 5 out of 5 in the bilateral lower extremities with shoulder abduction, shoulder flexion, elbow extension/flexion, grip strength.  No sensory deficit.  Psychiatric:        Mood and Affect: Mood normal.        Behavior: Behavior normal.     ED Results and Treatments Labs (all labs ordered are listed, but only abnormal results are displayed) Labs Reviewed - No data to display                                                                                                                        Radiology No results found.  Pertinent labs & imaging results that were available during my care of the patient were reviewed by me and considered in my medical decision making (see MDM for details).  Medications Ordered in ED Medications - No data to display  Procedures Procedures  (including critical care time)  Medical Decision Making / ED Course   MDM:  58 year old is here with neck pain that has been persistent for 1 week.  Had earlier ER visit today.  Symptoms overall seem most consistent with cervical radiculopathy.  Patient reports  symptoms are not significantly changed from onset. Examination is without evidence of focal process. ROM of neck is normal. Strength/sensation is normal.   At this time, I have a very low concern for any dangerous cause of his symptoms.  Reviewed prior imaging which shows some signs of arthritis around C3/4 without fracture.  His neurologic exam in the emergency department is normal without signs of objective weakness.  He denies any history of IV drug use, fevers to suggest spinal epidural abscess or occult infection, and he reports his symptoms are constant from when they began and not worsening.  He has no objective deficits to suggest any acute spinal cord compression.  He denies any headaches, fevers, meningismus to suggest any acute meningitis.  Symptoms do not seem consistent with any acute vascular process such as dissection.  I do not think that there would be any benefit for further testing at this time.  Since he has had frustrations with his orthopedic surgeon I recommended that he could also follow-up with a neurosurgeon provided information for this.  Although concern for any acute process is currently low, discussed extensively that if he does develop any new symptoms such as fevers, worsening sensation of weakness, severe worsening headaches, chest pain, difficulty breathing, or any other new symptoms, he should return immediately to the emergency department.      Additional history obtained:  -External records from outside source obtained and reviewed including: Chart review including previous notes, labs, imaging, consultation notes including prior notes    Medicines ordered and prescription drug management: No orders of the defined types were placed in this encounter.   -I have reviewed the patients home medicines and have made adjustments as needed  Social Determinants of Health:  Diagnosis or treatment significantly limited by social determinants of health: alcohol  use   Co morbidities that complicate the patient evaluation  Past Medical History:  Diagnosis Date   Allergy    Anemia    Anxiety    Arthritis    RA   Complication of anesthesia    hard to wake up-was told he may have sleep apnea-never tested   Depression    Diabetes mellitus without complication (HCC)    with weight no more problems   Seizure (HCC)    had one seizure of unknown etiology a few tears ago, none since and on no meds for them   Sleep apnea    Snores       Dispostion: Disposition decision including need for hospitalization was considered, and patient discharged from emergency department.    Final Clinical Impression(s) / ED Diagnoses Final diagnoses:  Cervical radiculopathy     This chart was dictated using voice recognition software.  Despite best efforts to proofread,  errors can occur which can change the documentation meaning.    Francesca Elsie CROME, MD 11/29/23 LEAFY    Francesca Elsie CROME, MD 11/29/23 2000

## 2023-11-29 NOTE — ED Triage Notes (Signed)
 Pt to the ED with ongoing neck pain. Seen for the same at [facility] today.  Pt states he was told he needed to get a scan and go to a different ER.

## 2023-11-29 NOTE — Discharge Instructions (Addendum)
 We evaluated you for your neck pain.  We suspect that you have a pinched nerve in your neck.  You may need further testing such as an MRI but this is not available today and we do not think you need it in the emergency department.  Since you have had trouble with the orthopedic provider, we would recommend that you follow-up with Dr. Darnella, neurosurgeon and spine surgeon.  Please call his office for an appointment.  Your examination is reassuring.  Please take Tylenol  (acetaminophen ) and Motrin  (ibuprofen ) for your symptoms at home.  You can take 1000 mg of Tylenol  every 6 hours and 600 mg of Motrin  every 6 hours as needed for your symptoms.  You can take these medicines together as needed, either at the same time, or alternating every 3 hours.   If you notice any new or worsening symptoms such as fevers, severe headaches, trouble using your arm, weakness in your legs, or any other new concerning symptoms, please return to the emergency department

## 2023-12-04 ENCOUNTER — Emergency Department (HOSPITAL_COMMUNITY)
Admission: EM | Admit: 2023-12-04 | Discharge: 2023-12-04 | Disposition: A | Discharge status: Home / Self Care | Source: Home / Self Care | Attending: Emergency Medicine | Admitting: Emergency Medicine

## 2023-12-04 ENCOUNTER — Emergency Department (HOSPITAL_COMMUNITY)

## 2023-12-04 ENCOUNTER — Other Ambulatory Visit: Payer: Self-pay

## 2023-12-04 ENCOUNTER — Encounter (HOSPITAL_COMMUNITY): Payer: Self-pay

## 2023-12-04 DIAGNOSIS — E119 Type 2 diabetes mellitus without complications: Secondary | ICD-10-CM | POA: Insufficient documentation

## 2023-12-04 DIAGNOSIS — M542 Cervicalgia: Secondary | ICD-10-CM | POA: Diagnosis not present

## 2023-12-04 DIAGNOSIS — M4802 Spinal stenosis, cervical region: Secondary | ICD-10-CM | POA: Insufficient documentation

## 2023-12-04 DIAGNOSIS — M5020 Other cervical disc displacement, unspecified cervical region: Secondary | ICD-10-CM | POA: Insufficient documentation

## 2023-12-04 DIAGNOSIS — Z7984 Long term (current) use of oral hypoglycemic drugs: Secondary | ICD-10-CM | POA: Insufficient documentation

## 2023-12-04 LAB — I-STAT CHEM 8, ED
BUN: 25 mg/dL — ABNORMAL HIGH (ref 6–20)
Calcium, Ion: 1.09 mmol/L — ABNORMAL LOW (ref 1.15–1.40)
Chloride: 108 mmol/L (ref 98–111)
Creatinine, Ser: 1.3 mg/dL — ABNORMAL HIGH (ref 0.61–1.24)
Glucose, Bld: 123 mg/dL — ABNORMAL HIGH (ref 70–99)
HCT: 50 % (ref 39.0–52.0)
Hemoglobin: 17 g/dL (ref 13.0–17.0)
Potassium: 4 mmol/L (ref 3.5–5.1)
Sodium: 139 mmol/L (ref 135–145)
TCO2: 21 mmol/L — ABNORMAL LOW (ref 22–32)

## 2023-12-04 MED ORDER — METHOCARBAMOL 500 MG PO TABS
500.0000 mg | ORAL_TABLET | Freq: Once | ORAL | Status: AC
  Administered 2023-12-04: 500 mg via ORAL
  Filled 2023-12-04: qty 1

## 2023-12-04 MED ORDER — IOHEXOL 350 MG/ML SOLN
75.0000 mL | Freq: Once | INTRAVENOUS | Status: AC | PRN
  Administered 2023-12-04: 75 mL via INTRAVENOUS

## 2023-12-04 MED ORDER — OXYCODONE HCL 5 MG PO TABS: 5.0000 mg | ORAL_TABLET | Freq: Four times a day (QID) | ORAL | 0 refills | Status: DC | PRN

## 2023-12-04 MED ORDER — PREDNISONE 10 MG (21) PO TBPK: ORAL_TABLET | Freq: Every day | ORAL | 0 refills | Status: DC

## 2023-12-04 MED ORDER — LORAZEPAM 2 MG/ML IJ SOLN
0.5000 mg | Freq: Once | INTRAMUSCULAR | Status: AC
  Administered 2023-12-04: 0.5 mg via INTRAVENOUS
  Filled 2023-12-04: qty 1

## 2023-12-04 MED ORDER — METHOCARBAMOL 500 MG PO TABS: 500.0000 mg | ORAL_TABLET | Freq: Two times a day (BID) | ORAL | 0 refills | Status: DC

## 2023-12-04 MED ORDER — LIDOCAINE 5 % EX PTCH
1.0000 | MEDICATED_PATCH | CUTANEOUS | Status: DC
  Administered 2023-12-04: 1 via TRANSDERMAL
  Filled 2023-12-04: qty 1

## 2023-12-04 MED ORDER — PREDNISONE 10 MG (21) PO TBPK
ORAL_TABLET | Freq: Every day | ORAL | 0 refills | Status: DC
Start: 2023-12-04 — End: 2023-12-07

## 2023-12-04 MED ORDER — MORPHINE SULFATE (PF) 4 MG/ML IV SOLN
4.0000 mg | Freq: Once | INTRAVENOUS | Status: AC
  Administered 2023-12-04: 4 mg via INTRAVENOUS
  Filled 2023-12-04: qty 1

## 2023-12-04 MED ORDER — KETOROLAC TROMETHAMINE 15 MG/ML IJ SOLN
15.0000 mg | Freq: Once | INTRAMUSCULAR | Status: AC
  Administered 2023-12-04: 15 mg via INTRAVENOUS
  Filled 2023-12-04: qty 1

## 2023-12-04 MED ORDER — ACETAMINOPHEN 500 MG PO TABS
1000.0000 mg | ORAL_TABLET | Freq: Once | ORAL | Status: AC
  Administered 2023-12-04: 1000 mg via ORAL
  Filled 2023-12-04: qty 2

## 2023-12-04 NOTE — ED Notes (Signed)
ETOH on board 

## 2023-12-04 NOTE — Progress Notes (Signed)
 58 y/o w/ hx smoking, DM2 A1c 5.2 who reportedly presents after a fall with neck pain and right arm radiculopathy. Reportedly moves all 4 extremities with full strength. MRI shows C3-4 severe DDD and central stenosis d/t left paracentric disc osteophyte and C5-6 severe stenosis d/t combination of disc osteophyte and acutely herniated disc fragment  Plan for close follow-up next week Discharge with medrol  dose pack and pain medications

## 2023-12-04 NOTE — Discharge Instructions (Addendum)
 Thank you for coming to [facility] Emergency Department. You were seen for neck pain. We did an exam, labs, and imaging, and these showed severe spinal stenosis and disc bulging in your cervical spine.  For this we have prescribed: - Robaxin  500 mg twice per day - Prednisone  steroid pack - Oxycodone  5 mg every 6 hours as needed for severe pain  Please take Tylenol  1,000 mg every 8 hours and Robaxin  first for pain.  You can also use lidocaine  patches which were previously prescribed to you, 1/day.  If these do not help then you can take oxycodone .  Please take great care when taking the oxycodone  as it can increase your risk for falls and sedation.  Please do not drink alcohol while taking oxycodone .    We have discussed with the neurosurgeon Dr. Darnella who will see you in clinic next week.  You can call his clinic today at [phone]  to make a follow-up appointment.  Return to the ED if you develop any of the following: - Fever (100.4 F or 38 C) or chills at home that do not respond to over the counter medications - Weakness, numbness, or tingling in your extremities - Difficulty emptying bladder / urinary incontinence - Fecal incontinence - Uncontrolled nausea/vomiting with inability to keep down liquids - Feeling as though you are going to pass out or passing out - Anything else that concerns you

## 2023-12-04 NOTE — ED Triage Notes (Signed)
 Pt fell recently on going neck pain since. Pt took aleve two days ago and this morning. Pain similar to when he fell. Pain shoots down to shoulder pain rated 10/10.

## 2023-12-04 NOTE — ED Provider Notes (Signed)
 [place] EMERGENCY DEPARTMENT AT [facility] Provider Note   CSN: [id number] Arrival date & time: 12/04/23  9355     History  Chief Complaint  Patient presents with   Neck Injury    Brandon Miller is a 58 y.o. male with PMH as listed below who presents with neck pain after a fall.  Pt took aleve  two days ago and this morning. Pain similar to when he fell. Pain shoots down to shoulder pain rated 10/10. He endorses some periodic numbness/tingling in his left upper extremity but none now and none on left side.  Radiates up into his head and causes some headache. Severe pain, not improving. Has seen [facility] and has had CT c-spine as well from ED visit on 11/21/23 which showed no acute changes and R shoulder XR too which was neg. Patient denies falling, f/c, facial droop, visual changes.    Past Medical History:  Diagnosis Date   Allergy    Anemia    Anxiety    Arthritis    RA   Complication of anesthesia    hard to wake up-was told he may have sleep apnea-never tested   Depression    Diabetes mellitus without complication (HCC)    with weight no more problems   Seizure (HCC)    had one seizure of unknown etiology a few tears ago, none since and on no meds for them   Sleep apnea    Snores        Home Medications Prior to Admission medications   Medication Sig Start Date End Date Taking? Authorizing Provider  acetaminophen  (TYLENOL ) 500 MG tablet Take 1,000 mg by mouth every 6 (six) hours as needed for mild pain (pain score 1-3).    [provider]  amphetamine -dextroamphetamine  (ADDERALL) 20 MG tablet Take 20 mg by mouth 2 (two) times daily. 09/14/20   [provider]  baclofen (LIORESAL) 10 MG tablet Take 10 mg by mouth 3 (three) times daily as needed (pain). 11/25/23   [provider]  buPROPion  (WELLBUTRIN  XL) 150 MG 24 hr tablet Take 150 mg by mouth every morning. 11/13/23   [provider]  colchicine   0.6 MG tablet Take 1 tablet (0.6 mg total) by mouth 2 (two) times daily. 08/19/22   Cleotilde Rogue, MD  diazepam  (VALIUM ) 5 MG tablet Take 0.5 tablets (2.5 mg total) by mouth every 12 (twelve) hours as needed for anxiety. 11/27/23   Barrett, Jamie N, PA-C  gabapentin  (NEURONTIN ) 600 MG tablet Take 600 mg by mouth 3 (three) times daily.    [provider]  ibuprofen  (ADVIL ) 200 MG tablet Take 400 mg by mouth every 6 (six) hours as needed for mild pain (pain score 1-3).    [provider]  methocarbamol  (ROBAXIN ) 500 MG tablet Take 1 tablet (500 mg total) by mouth 2 (two) times daily. 12/04/23   Franklyn Sid SAILOR, MD  mirtazapine  (REMERON ) 30 MG tablet Take 1 tablet (30 mg total) by mouth at bedtime. Patient taking differently: Take 30 mg by mouth every other day. 10/17/20 11/21/26  Doda, Vandana, MD  naproxen  (NAPROSYN ) 500 MG tablet Take 1 tablet (500 mg total) by mouth 2 (two) times daily. 11/27/23   Barrett, Warren SAILOR, PA-C  predniSONE  (STERAPRED UNI-PAK 21 TAB) 10 MG (21) TBPK tablet Take 10 mg by mouth daily. Take 6 tablets by mouth daily for 2 days, then 5 tablets daily for 2 days, then 4 tablets daily for 2 days,  then 3 tablets daily for 2 days, then 2 tablets daily for 2 days, then 1 tablet daily for 2 days. 12/04/23 01/15/24  [provider]      Allergies    Tizanidine hcl, Allopurinol , Indomethacin, Daypro [oxaprozin], Diclofenac sodium, and Prednisone     Review of Systems   Review of Systems A 10 point review of systems was performed and is negative unless otherwise reported in HPI.  Physical Exam Updated Vital Signs BP (!) 148/88 (BP Location: Left Arm)   Pulse 62   Temp 98.5 F (36.9 C) (Oral)   Resp 17   Ht 6' 3 (1.905 m)   Wt 104.3 kg   SpO2 97%   BMI 28.74 kg/m  Physical Exam General: Uncomfortable appearing male, lying in bed.  HEENT: PERRLA, Sclera anicteric, MMM, trachea midline. +Midline C spine TTP with no deformities/step-offs with bilateral  c-spine paraspinal muscular TTP Cardiology: RRR, no murmurs/rubs/gallops. BL radial and DP pulses equal bilaterally. No chest wall TTP or crepitus.  Resp: Normal respiratory rate and effort. CTAB, no wheezes, rhonchi, crackles.  Abd: Soft, non-tender, non-distended. No rebound tenderness or guarding.  GU: Deferred. MSK: No peripheral edema or signs of trauma. Extremities without deformity or TTP. No cyanosis or clubbing. Skin: warm, dry. No rashes or lesions. Back: No CVA tenderness. No midline T or L spine TTP deformities or step-offs.  Neuro: A&Ox4, CNs II-XII grossly intact. MAEs. Sensation grossly intact.  Psych: Normal mood and affect.   ED Results / Procedures / Treatments   Labs (all labs ordered are listed, but only abnormal results are displayed) Labs Reviewed  I-STAT CHEM 8, ED - Abnormal; Notable for the following components:      Result Value   BUN 25 (*)    Creatinine, Ser 1.30 (*)    Glucose, Bld 123 (*)    Calcium , Ion 1.09 (*)    TCO2 21 (*)    All other components within normal limits    EKG None  Radiology CTA H&N: 1. No large vessel occlusion, hemodynamically significant stenosis, or aneurysm in the head or neck 2. Mild calcific plaque within the proximal right internal carotid artery and at the posterior wall of the left internal carotid artery origin, both with 0% luminal stenosis 3. Dominant left vertebral artery with relatively hypoplastic right vertebral artery  Mri C-spine wo contrast: 1. Severe spinal canal stenosis and spinal cord compression at C5-6 due to a bulging disc osteophyte complex and caudal central disc extrusion, with associated mildly increased T2 signal in the cord. Mild-to-moderate bilateral neuroforaminal stenosis at this level. 2. Severe left-sided spinal canal stenosis and severe left neural foraminal stenosis at C3-4 due to a left posterolateral disc osteophyte complex, resulting in left spinal cord compression and likely left  C4 nerve compression. Moderate right-sided spinal canal and neural foraminal stenosis at this level. 3. Mild left neural foraminal stenosis at C4-5 due to mild left facet hypertrophy. Central canal and right neural foramina are patent.  Procedures Procedures    Medications Ordered in ED Medications  methocarbamol  (ROBAXIN ) tablet 500 mg (500 mg Oral Given 12/04/23 0726)  morphine  (PF) 4 MG/ML injection 4 mg (4 mg Intravenous Given 12/04/23 0734)  acetaminophen  (TYLENOL ) tablet 1,000 mg (1,000 mg Oral Given 12/04/23 0725)  iohexol  (OMNIPAQUE ) 350 MG/ML injection 75 mL (75 mLs Intravenous Contrast Given 12/04/23 0829)  morphine  (PF) 4 MG/ML injection 4 mg (4 mg Intravenous Given 12/04/23 1000)  ketorolac  (TORADOL ) 15 MG/ML injection 15 mg (15 mg Intravenous  Given 12/04/23 1001)  LORazepam  (ATIVAN ) injection 0.5 mg (0.5 mg Intravenous Given 12/04/23 1106)    ED Course/ Medical Decision Making/ A&P                          Medical Decision Making Amount and/or Complexity of Data Reviewed Radiology: ordered. Decision-making details documented in ED Course.  Risk OTC drugs. Prescription drug management.    This patient presents to the ED for concern of neck pain, this involves an extensive number of treatment options, and is a complaint that carries with it a high risk of complications and morbidity.  I considered the following differential and admission for this acute, potentially life threatening condition.   MDM:    Patient with recurrent presentations for severe neck pain. He does have h/o alcohol abuse and is unsure if he has had trauma. He has had CT c-spine which didn't show acute fx. Obtained CTA H&N to r/o arterial dissection or occlusion which was reassuringly neg. He does report some numbness/tingling. Given his repeat presentations and neurologic sxs, raises c/f discitis/transverse myelitis, spinal epidural abscess. He has no h/o f/c or previous surgery, no h/o IVDU or  cancer, no c/f cauda equina, but his repeated presentations for severe pain, performed SDM with patient and will obtain MRI imaging today.   Clinical Course as of 12/10/23 1139  Wed Dec 04, 2023  0909 CT ANGIO HEAD NECK W WO CM 1. No large vessel occlusion, hemodynamically significant stenosis, or aneurysm in the head or neck 2. Mild calcific plaque within the proximal right internal carotid artery and at the posterior wall of the left internal carotid artery origin, both with 0% luminal stenosis 3. Dominant left vertebral artery with relatively hypoplastic right vertebral artery   [HN]  1024 Pain finally improved after 8 mg IV morphine , tylenol , robaxin , toradol . He has followed w/ murphy wainer in clinic but hasn't had MRI. He does experience some tingling in his RUE intermittently. No h/o IVDU or cancer.  [HN]  1257 MR Cervical Spine Wo Contrast 1. Severe spinal canal stenosis and spinal cord compression at C5-6 due to a bulging disc osteophyte complex and caudal central disc extrusion, with associated mildly increased T2 signal in the cord. Mild-to-moderate bilateral neuroforaminal stenosis at this level. 2. Severe left-sided spinal canal stenosis and severe left neural foraminal stenosis at C3-4 due to a left posterolateral disc osteophyte complex, resulting in left spinal cord compression and likely left C4 nerve compression. Moderate right-sided spinal canal and neural foraminal stenosis at this level. 3. Mild left neural foraminal stenosis at C4-5 due to mild left facet hypertrophy. Central canal and right neural foramina are patent.   [HN]  1258 Consulted to NSGY [HN]  1334 D/w Dr. Darnella who recommends steroids, pain medicine, and close f/u in clinic next week.  [HN]    Clinical Course User Index [HN] Franklyn Sid SAILOR, MD    Patient w/ severe spinal canal stenosis and neural foraminal stenosis. NSGY Dr. Darnella recommends medrol  dosepak, pain medicine, and close f/u next week likely  for surgical intervention. Discussed this with patient who reports understanding. I advised alcohol cessation and great care when taking pain medication at home d/t increased risk for falls. Patient is prescribed robaxin  and prednisone  steroid pack, instructed to take tylenol  for pain, lidocaine  patches as well, and then oxycodone  for breakthrough pain. DC w/ discharge instructions/return precautions. All questions answered to patient's satisfaction.     Labs: I  Ordered, and personally interpreted labs.  The pertinent results include:  those listed above  Imaging Studies ordered: I ordered imaging studies including CTA H&N, MRI C-spine I independently visualized and interpreted imaging. I agree with the radiologist interpretation  Additional history obtained from chart review.    Reevaluation: After the interventions noted above, I reevaluated the patient and found that they have :improved  Social Determinants of Health: Lives independently  Disposition:  DC w/ discharge instructions/return precautions. All questions answered to patient's satisfaction.    Co morbidities that complicate the patient evaluation  Past Medical History:  Diagnosis Date   Allergy    Anemia    Anxiety    Arthritis    RA   Complication of anesthesia    hard to wake up-was told he may have sleep apnea-never tested   Depression    Diabetes mellitus without complication (HCC)    with weight no more problems   Seizure (HCC)    had one seizure of unknown etiology a few tears ago, none since and on no meds for them   Sleep apnea    Snores      Medicines Meds ordered this encounter  Medications   methocarbamol  (ROBAXIN ) tablet 500 mg   DISCONTD: lidocaine  (LIDODERM ) 5 % 1 patch   morphine  (PF) 4 MG/ML injection 4 mg    Refill:  0   acetaminophen  (TYLENOL ) tablet 1,000 mg   iohexol  (OMNIPAQUE ) 350 MG/ML injection 75 mL   morphine  (PF) 4 MG/ML injection 4 mg    Refill:  0   ketorolac  (TORADOL ) 15  MG/ML injection 15 mg   LORazepam  (ATIVAN ) injection 0.5 mg   DISCONTD: methocarbamol  (ROBAXIN ) 500 MG tablet    Sig: Take 1 tablet (500 mg total) by mouth 2 (two) times daily.    Dispense:  20 tablet    Refill:  0   DISCONTD: oxyCODONE  (ROXICODONE ) 5 MG immediate release tablet    Sig: Take 1 tablet (5 mg total) by mouth every 6 (six) hours as needed for up to 3 days for severe pain (pain score 7-10).    Dispense:  14 tablet    Refill:  0   DISCONTD: predniSONE  (STERAPRED UNI-PAK 21 TAB) 10 MG (21) TBPK tablet    Sig: Take by mouth daily. Take 6 tabs by mouth daily  for 2 days, then 5 tabs for 2 days, then 4 tabs for 2 days, then 3 tabs for 2 days, 2 tabs for 2 days, then 1 tab by mouth daily for 2 days    Dispense:  42 tablet    Refill:  0   DISCONTD: predniSONE  (STERAPRED UNI-PAK 21 TAB) 10 MG (21) TBPK tablet    Sig: Take by mouth daily. Take 6 tabs by mouth daily  for 2 days, then 5 tabs for 2 days, then 4 tabs for 2 days, then 3 tabs for 2 days, 2 tabs for 2 days, then 1 tab by mouth daily for 2 days    Dispense:  42 tablet    Refill:  0   oxyCODONE  (ROXICODONE ) 5 MG immediate release tablet    Sig: Take 1 tablet (5 mg total) by mouth every 6 (six) hours as needed for up to 3 days for severe pain (pain score 7-10).    Dispense:  14 tablet    Refill:  0   methocarbamol  (ROBAXIN ) 500 MG tablet    Sig: Take 1 tablet (500 mg total) by mouth 2 (two) times daily.  Dispense:  20 tablet    Refill:  0    I have reviewed the patients home medicines and have made adjustments as needed  Problem List / ED Course: Problem List Items Addressed This Visit   None Visit Diagnoses       Cervical disc herniation    -  Primary     Cervical spinal stenosis         Neck pain, acute                       This note was created using dictation software, which may contain spelling or grammatical errors.    Franklyn Sid SAILOR, MD 12/17/23 [phone]

## 2023-12-06 ENCOUNTER — Inpatient Hospital Stay (HOSPITAL_COMMUNITY)
Admission: EM | Admit: 2023-12-06 | Discharge: 2023-12-10 | DRG: 472 | Disposition: A | Discharge status: Home / Self Care | Attending: Family Medicine | Admitting: Family Medicine

## 2023-12-06 ENCOUNTER — Other Ambulatory Visit: Payer: Self-pay

## 2023-12-06 ENCOUNTER — Encounter (HOSPITAL_COMMUNITY): Payer: Self-pay | Admitting: Emergency Medicine

## 2023-12-06 DIAGNOSIS — M50022 Cervical disc disorder at C5-C6 level with myelopathy: Secondary | ICD-10-CM | POA: Diagnosis present

## 2023-12-06 DIAGNOSIS — M2578 Osteophyte, vertebrae: Secondary | ICD-10-CM | POA: Diagnosis present

## 2023-12-06 DIAGNOSIS — M542 Cervicalgia: Secondary | ICD-10-CM | POA: Diagnosis present

## 2023-12-06 DIAGNOSIS — G9520 Unspecified cord compression: Secondary | ICD-10-CM | POA: Diagnosis not present

## 2023-12-06 DIAGNOSIS — F17210 Nicotine dependence, cigarettes, uncomplicated: Secondary | ICD-10-CM | POA: Diagnosis present

## 2023-12-06 DIAGNOSIS — M5001 Cervical disc disorder with myelopathy,  high cervical region: Principal | ICD-10-CM | POA: Diagnosis present

## 2023-12-06 DIAGNOSIS — Z79899 Other long term (current) drug therapy: Secondary | ICD-10-CM | POA: Diagnosis not present

## 2023-12-06 DIAGNOSIS — Z9181 History of falling: Secondary | ICD-10-CM

## 2023-12-06 DIAGNOSIS — D696 Thrombocytopenia, unspecified: Secondary | ICD-10-CM | POA: Diagnosis present

## 2023-12-06 DIAGNOSIS — G4730 Sleep apnea, unspecified: Secondary | ICD-10-CM | POA: Diagnosis present

## 2023-12-06 DIAGNOSIS — F419 Anxiety disorder, unspecified: Secondary | ICD-10-CM | POA: Diagnosis present

## 2023-12-06 DIAGNOSIS — M109 Gout, unspecified: Secondary | ICD-10-CM | POA: Diagnosis present

## 2023-12-06 DIAGNOSIS — Z720 Tobacco use: Secondary | ICD-10-CM

## 2023-12-06 DIAGNOSIS — M50122 Cervical disc disorder at C5-C6 level with radiculopathy: Secondary | ICD-10-CM | POA: Diagnosis present

## 2023-12-06 DIAGNOSIS — F418 Other specified anxiety disorders: Secondary | ICD-10-CM | POA: Diagnosis not present

## 2023-12-06 DIAGNOSIS — F1020 Alcohol dependence, uncomplicated: Secondary | ICD-10-CM | POA: Diagnosis present

## 2023-12-06 DIAGNOSIS — F32A Depression, unspecified: Secondary | ICD-10-CM | POA: Diagnosis present

## 2023-12-06 DIAGNOSIS — M4802 Spinal stenosis, cervical region: Secondary | ICD-10-CM | POA: Diagnosis present

## 2023-12-06 DIAGNOSIS — Z96651 Presence of right artificial knee joint: Secondary | ICD-10-CM | POA: Diagnosis present

## 2023-12-06 DIAGNOSIS — M5011 Cervical disc disorder with radiculopathy,  high cervical region: Secondary | ICD-10-CM | POA: Diagnosis present

## 2023-12-06 DIAGNOSIS — E119 Type 2 diabetes mellitus without complications: Secondary | ICD-10-CM | POA: Diagnosis present

## 2023-12-06 LAB — CBC WITH DIFFERENTIAL/PLATELET
Abs Immature Granulocytes: 0.07 K/uL (ref 0.00–0.07)
Basophils Absolute: 0 K/uL (ref 0.0–0.1)
Basophils Relative: 0 %
Eosinophils Absolute: 0 K/uL (ref 0.0–0.5)
Eosinophils Relative: 0 %
HCT: 44.4 % (ref 39.0–52.0)
Hemoglobin: 16 g/dL (ref 13.0–17.0)
Immature Granulocytes: 1 %
Lymphocytes Relative: 20 %
Lymphs Abs: 1.6 K/uL (ref 0.7–4.0)
MCH: 32.6 pg (ref 26.0–34.0)
MCHC: 36 g/dL (ref 30.0–36.0)
MCV: 90.4 fL (ref 80.0–100.0)
Monocytes Absolute: 0.8 K/uL (ref 0.1–1.0)
Monocytes Relative: 9 %
Neutro Abs: 5.7 K/uL (ref 1.7–7.7)
Neutrophils Relative %: 70 %
Platelets: 111 K/uL — ABNORMAL LOW (ref 150–400)
RBC: 4.91 MIL/uL (ref 4.22–5.81)
RDW: 13.2 % (ref 11.5–15.5)
WBC: 8.1 K/uL (ref 4.0–10.5)
nRBC: 0 % (ref 0.0–0.2)

## 2023-12-06 LAB — BASIC METABOLIC PANEL WITH GFR
Anion gap: 12 (ref 5–15)
BUN: 19 mg/dL (ref 6–20)
CO2: 24 mmol/L (ref 22–32)
Calcium: 8.8 mg/dL — ABNORMAL LOW (ref 8.9–10.3)
Chloride: 99 mmol/L (ref 98–111)
Creatinine, Ser: 0.9 mg/dL (ref 0.61–1.24)
GFR, Estimated: >60 mL/min (ref >60)
Glucose, Bld: 102 mg/dL — ABNORMAL HIGH (ref 70–99)
Potassium: 3.8 mmol/L (ref 3.5–5.1)
Sodium: 135 mmol/L (ref 135–145)

## 2023-12-06 MED ORDER — HYDROMORPHONE HCL 1 MG/ML IJ SOLN
1.0000 mg | Freq: Once | INTRAMUSCULAR | Status: AC
  Administered 2023-12-06: 1 mg via INTRAVENOUS
  Filled 2023-12-06: qty 1

## 2023-12-06 MED ORDER — SODIUM CHLORIDE 0.9 % IV BOLUS
1000.0000 mL | Freq: Once | INTRAVENOUS | Status: AC
  Administered 2023-12-06: 1000 mL via INTRAVENOUS

## 2023-12-06 MED ORDER — HYDROMORPHONE HCL 1 MG/ML IJ SOLN
0.5000 mg | INTRAMUSCULAR | Status: DC | PRN
Start: 2023-12-06 — End: 2023-12-07

## 2023-12-06 NOTE — H&P (Incomplete)
 History and Physical    Patient: Brandon Miller FMW:[id number] DOB: Dec 09, 1965 DOA: 12/06/2023 DOS: the patient was seen and examined on 12/06/2023 PCP: [facility]  Patient coming from: Home  Chief Complaint:  Chief Complaint  Patient presents with  . Neck Pain   HPI: [name] is a 58 y.o. male with medical history significant of      Review of Systems: {ROS_Text:26778} Past Medical History:  Diagnosis Date  . Allergy   . Anemia   . Anxiety   . Arthritis    RA  . Complication of anesthesia    hard to wake up-was told he may have sleep apnea-never tested  . Depression   . Diabetes mellitus without complication (HCC)    with weight no more problems  . Seizure (HCC)    had one seizure of unknown etiology a few tears ago, none since and on no meds for them  . Sleep apnea   . Snores    Past Surgical History:  Procedure Laterality Date  . BIOPSY  02/12/2019   Procedure: BIOPSY;  Surgeon: Shaaron Lamar HERO, MD;  Location: [facility];  Service: Endoscopy;;  stomach  . COLONOSCOPY WITH PROPOFOL  N/A 02/12/2019   Procedure: COLONOSCOPY WITH PROPOFOL ;  Surgeon: Shaaron Lamar HERO, MD;  Location: [facility];  Service: Endoscopy;  Laterality: N/A;  10:30am  . ELBOW SURGERY Right 2010   Dr. Beverley  . ESOPHAGOGASTRODUODENOSCOPY (EGD) WITH PROPOFOL  N/A 02/12/2019   Procedure: ESOPHAGOGASTRODUODENOSCOPY (EGD) WITH PROPOFOL ;  Surgeon: Shaaron Lamar HERO, MD;  Location: [facility];  Service: Endoscopy;  Laterality: N/A;  . HERNIA REPAIR     Right inguinal  . KNEE ARTHROSCOPY WITH MEDIAL MENISECTOMY Right 06/11/2013   Procedure: RIGHT KNEE ARTHROSCOPY WITH MEDIAL MENISECTOMY AND CHONDROPLASTY;  Surgeon: Toribio JULIANNA Beverley, MD;  Location: [facility];  Service: Orthopedics;  Laterality: Right;  Chondroplasty, loose bodies, medial menisectomy, lysis of adhesions.  SABRA KNEE SURGERY Right 2011  . POLYPECTOMY  02/12/2019   Procedure:  POLYPECTOMY;  Surgeon: Shaaron Lamar HERO, MD;  Location: [facility];  Service: Endoscopy;;  . right ankle    . SHOULDER SURGERY Right 2010   Dr. Beverley  . TOTAL KNEE ARTHROPLASTY Right 08/25/2014   Procedure: TOTAL KNEE ARTHROPLASTY;  Surgeon: Toribio JULIANNA Beverley, MD;  Location: [facility] OR;  Service: Orthopedics;  Laterality: Right;   Social History:  reports that he has been smoking cigarettes. He has a 37.5 pack-year smoking history. He has never used smokeless tobacco. He reports current alcohol use. He reports that he does not use drugs.  Allergies  Allergen Reactions  . Tizanidine Hcl Anaphylaxis and Swelling  . Allopurinol  Other (See Comments)    Elevated liver enzymes  . Indomethacin Nausea And Vomiting, Nausea Only and Swelling  . Prednisone      Gets very mean  . Daypro [Oxaprozin] Other (See Comments)    Tears his stomach up  . Diclofenac Sodium Other (See Comments)    Dizzy     Family History  Problem Relation Age of Onset  . Hepatitis Mother   . Cirrhosis Mother        Did not drink  . Pneumonia Father   . Kidney failure Father   . Gastric cancer Brother        ? Gastric CA spread to esophagus  . Colon cancer Neg Hx     Prior to Admission medications   Medication Sig Start Date End Date  Taking? Authorizing Provider  amphetamine -dextroamphetamine  (ADDERALL) 20 MG tablet Take 20 mg by mouth 2 (two) times daily. 09/14/20   [provider]  azithromycin  (ZITHROMAX ) 250 MG tablet Take 2 tablets together on the first day, then 1 every day until finished. 11/15/22   Theadore Ozell HERO, MD  B Complex-C (B-COMPLEX WITH VITAMIN C) tablet Take 1 tablet by mouth daily. 10/18/20   Doda, Vandana, MD  benzocaine  (ORAJEL) 10 % mucosal gel Use as directed in the mouth or throat 2 (two) times daily as needed for mouth pain (toothpain). 10/17/20 11/16/20  Doda, Vandana, MD  buPROPion  (WELLBUTRIN  XL) 150 MG 24 hr tablet Take 150 mg by mouth every morning. 11/13/23   [provider]  cephALEXin  (KEFLEX ) 500 MG capsule Take 1 capsule (500 mg total) by mouth 4 (four) times daily. 03/21/23   Neysa Caron PARAS, DO  colchicine  0.6 MG tablet Take 1 tablet (0.6 mg total) by mouth 2 (two) times daily. 08/19/22   Cleotilde Rogue, MD  diazepam (VALIUM) 5 MG tablet Take 0.5 tablets (2.5 mg total) by mouth every 12 (twelve) hours as needed for anxiety. 11/27/23   Barrett, Warren SAILOR, PA-C  gabapentin  (NEURONTIN ) 600 MG tablet Take 600 mg by mouth 3 (three) times daily.    [provider]  lidocaine  (LIDODERM ) 5 % Place 1 patch onto the skin daily. Remove & Discard patch within 12 hours or as directed by MD 11/27/23   Barrett, Warren SAILOR, PA-C  methocarbamol  (ROBAXIN ) 500 MG tablet Take 1 tablet (500 mg total) by mouth 2 (two) times daily. 12/04/23   Franklyn Sid SAILOR, MD  mirtazapine  (REMERON ) 30 MG tablet Take 1 tablet (30 mg total) by mouth at bedtime. 10/17/20 11/21/26  Doda, Vandana, MD  mirtazapine  (REMERON ) 45 MG tablet Take 45 mg by mouth at bedtime. 09/27/23   [provider]  Multiple Vitamin (MULTIVITAMIN WITH MINERALS) TABS tablet Take 1 tablet by mouth daily. Patient not taking: No sig reported 05/18/20   Hansel Comer RAMAN, MD  Naphazoline HCl (CLEAR EYES OP) Apply 1 drop to eye daily as needed (dry red eyes).    [provider]  naproxen (NAPROSYN) 500 MG tablet Take 1 tablet (500 mg total) by mouth 2 (two) times daily. 11/27/23   Barrett, Jamie N, PA-C  nicotine  (NICODERM CQ  - DOSED IN MG/24 HR) 7 mg/24hr patch Place 1 patch (7 mg total) onto the skin daily. 10/18/20   Doda, Vandana, MD  omega-3 acid ethyl esters (LOVAZA ) 1 g capsule Take 1 capsule (1 g total) by mouth 2 (two) times daily. 10/17/20   Doda, Vandana, MD  oxyCODONE  (ROXICODONE ) 5 MG immediate release tablet Take 1 tablet (5 mg total) by mouth every 6 (six) hours as needed for up to 3 days for severe pain (pain score 7-10). 12/04/23 12/07/23  Franklyn Sid SAILOR, MD  predniSONE  (STERAPRED UNI-PAK 21 TAB) 10  MG (21) TBPK tablet Take by mouth daily. Take 6 tabs by mouth daily  for 2 days, then 5 tabs for 2 days, then 4 tabs for 2 days, then 3 tabs for 2 days, 2 tabs for 2 days, then 1 tab by mouth daily for 2 days 12/04/23   Franklyn Sid SAILOR, MD  QUEtiapine  (SEROQUEL ) 25 MG tablet Take 1 tablet (25 mg total) by mouth at bedtime. 10/17/20 11/16/20  Penne Lovelace, MD    Physical Exam: Vitals:   12/06/23 2045 12/06/23 2100 12/06/23 2131 12/06/23 2215  BP:  (!) 142/90 (!) 153/86  Pulse: 71 70 63 72  Resp:      Temp:      TempSrc:      SpO2: 97% 96% 92% 97%  Weight:      Height:       *** Data Reviewed: {Tip this will not be part of the note when signed- Document your independent interpretation of telemetry tracing, EKG, lab, Radiology test or any other diagnostic tests. Add any new diagnostic test ordered today. (Optional):26781} {Results:26384}  Assessment and Plan: No notes have been filed under this hospital service. Service: Hospitalist     Advance Care Planning:   Code Status: Prior ***  Consults: ***  Family Communication: ***  Severity of Illness: {Observation/Inpatient:21159}  Author: Posey Maier, DO 12/06/2023 11:58 PM  For on call review [email or web address].

## 2023-12-06 NOTE — ED Triage Notes (Signed)
 Pt arrived to ED viz POV c/o continued neck pain. Pt was here 2 nights ago for same. Pt states the pain has increased since discharge.

## 2023-12-06 NOTE — H&P (Signed)
 History and Physical    Patient: Brandon Miller FMW:[id number] DOB: Jan 18, 1966 DOA: 12/06/2023 DOS: the patient was seen and examined on 12/06/2023 PCP: [facility]  Patient coming from: Home  Chief Complaint:  Chief Complaint  Patient presents with   Neck Pain   HPI: Brandon Miller is a 58 y.o. male with medical history significant of anxiety, depression, tobacco abuse who presents to the emergency department due to worsening neck pain.  Patient endorsed sustaining a fall about 3 weeks in her sister's bathroom and denies hitting his head.  He initially complained of shoulder pain, but eventually developed some neck pain with radiation to the arms.  He was seen in the ED and treated with Valium, Toradol  and lidocaine  patch.  He was discharged with Lidoderm , Valium and naproxen.  Patient has since followed up with orthopedic Provider where he received injections to right shoulder and neck without any improvement.   MRI of cervical spine shows C3-C4 severe DDD and central stenosis due to left paracentric disc osteophyte and C5-6 severe stenosis.  Neurosurgery (Dr. Darnella) consulted and recommended Medrol  Dosepak and pain medications and for outpatient follow-up on Monday 11/17.  He presented to the ER today due to worsening pain with weakness in her arms and legs and some trouble walking, but denies urinary/bowel incontinence.  Patient denies any new falls.  ED course In the emergency department, BP was 172/83, other vital signs were within normal range.  Workup in the ED showed normal CBC and BMP except for blood glucose of 102.  Dilaudid  was given, IV hydration was provided. Neurosurgery (NP Meyran) was consulted who recommended patient be admitted to [facility] and that neurosurgery team will consult on him on arrival.   Review of Systems: As mentioned in the history of present illness. All other systems reviewed and are negative. Past Medical History:   Diagnosis Date   Allergy    Anemia    Anxiety    Arthritis    RA   Complication of anesthesia    hard to wake up-was told he may have sleep apnea-never tested   Depression    Diabetes mellitus without complication (HCC)    with weight no more problems   Seizure (HCC)    had one seizure of unknown etiology a few tears ago, none since and on no meds for them   Sleep apnea    Snores    Past Surgical History:  Procedure Laterality Date   BIOPSY  02/12/2019   Procedure: BIOPSY;  Surgeon: Shaaron Lamar HERO, MD;  [facility];  Service: Endoscopy;;  stomach   COLONOSCOPY WITH PROPOFOL  N/A 02/12/2019   Procedure: COLONOSCOPY WITH PROPOFOL ;  Surgeon: Shaaron Lamar HERO, MD;  Location: [facility];  Service: Endoscopy;  Laterality: N/A;  10:30am   ELBOW SURGERY Right 2010   Dr. Beverley   ESOPHAGOGASTRODUODENOSCOPY (EGD) WITH PROPOFOL  N/A 02/12/2019   Procedure: ESOPHAGOGASTRODUODENOSCOPY (EGD) WITH PROPOFOL ;  Surgeon: Shaaron Lamar HERO, MD;  Location: [facility];  Service: Endoscopy;  Laterality: N/A;   HERNIA REPAIR     Right inguinal   KNEE ARTHROSCOPY WITH MEDIAL MENISECTOMY Right 06/11/2013   Procedure: RIGHT KNEE ARTHROSCOPY WITH MEDIAL MENISECTOMY AND CHONDROPLASTY;  Surgeon: Toribio JULIANNA Beverley, MD;  Location: [facility];  Service: Orthopedics;  Laterality: Right;  Chondroplasty, loose bodies, medial menisectomy, lysis of adhesions.   KNEE SURGERY Right 2011   POLYPECTOMY  02/12/2019   Procedure: POLYPECTOMY;  Surgeon: Shaaron,  Lamar HERO, MD;  Location: [facility];  Service: Endoscopy;;   right ankle     SHOULDER SURGERY Right 2010   Dr. Beverley   TOTAL KNEE ARTHROPLASTY Right 08/25/2014   Procedure: TOTAL KNEE ARTHROPLASTY;  Surgeon: Toribio JULIANNA Beverley, MD;  Location: [facility] OR;  Service: Orthopedics;  Laterality: Right;   Social History:  reports that he has been smoking cigarettes. He has a 37.5 pack-year smoking history. He has never used smokeless tobacco. He reports  current alcohol use. He reports that he does not use drugs.  Allergies  Allergen Reactions   Tizanidine Hcl Anaphylaxis and Swelling   Allopurinol  Other (See Comments)    Elevated liver enzymes   Indomethacin Nausea And Vomiting, Nausea Only and Swelling   Prednisone      Gets very mean   Daypro [Oxaprozin] Other (See Comments)    Tears his stomach up   Diclofenac Sodium Other (See Comments)    Dizzy     Family History  Problem Relation Age of Onset   Hepatitis Mother    Cirrhosis Mother        Did not drink   Pneumonia Father    Kidney failure Father    Gastric cancer Brother        ? Gastric CA spread to esophagus   Colon cancer Neg Hx     Prior to Admission medications   Medication Sig Start Date End Date Taking? Authorizing Provider  amphetamine -dextroamphetamine  (ADDERALL) 20 MG tablet Take 20 mg by mouth 2 (two) times daily. 09/14/20   [provider]  azithromycin  (ZITHROMAX ) 250 MG tablet Take 2 tablets together on the first day, then 1 every day until finished. 11/15/22   Theadore Ozell HERO, MD  B Complex-C (B-COMPLEX WITH VITAMIN C) tablet Take 1 tablet by mouth daily. 10/18/20   Doda, Vandana, MD  benzocaine  (ORAJEL) 10 % mucosal gel Use as directed in the mouth or throat 2 (two) times daily as needed for mouth pain (toothpain). 10/17/20 11/16/20  Doda, Vandana, MD  buPROPion  (WELLBUTRIN  XL) 150 MG 24 hr tablet Take 150 mg by mouth every morning. 11/13/23   [provider]  cephALEXin  (KEFLEX ) 500 MG capsule Take 1 capsule (500 mg total) by mouth 4 (four) times daily. 03/21/23   Neysa Caron PARAS, DO  colchicine  0.6 MG tablet Take 1 tablet (0.6 mg total) by mouth 2 (two) times daily. 08/19/22   Cleotilde Rogue, MD  diazepam (VALIUM) 5 MG tablet Take 0.5 tablets (2.5 mg total) by mouth every 12 (twelve) hours as needed for anxiety. 11/27/23   Barrett, Jamie N, PA-C  gabapentin  (NEURONTIN ) 600 MG tablet Take 600 mg by mouth 3 (three) times daily.    [provider]  lidocaine  (LIDODERM ) 5 % Place 1 patch onto the skin daily. Remove & Discard patch within 12 hours or as directed by MD 11/27/23   Barrett, Warren SAILOR, PA-C  methocarbamol  (ROBAXIN ) 500 MG tablet Take 1 tablet (500 mg total) by mouth 2 (two) times daily. 12/04/23   Franklyn Sid SAILOR, MD  mirtazapine  (REMERON ) 30 MG tablet Take 1 tablet (30 mg total) by mouth at bedtime. 10/17/20 11/21/26  Doda, Vandana, MD  mirtazapine  (REMERON ) 45 MG tablet Take 45 mg by mouth at bedtime. 09/27/23   [provider]  Multiple Vitamin (MULTIVITAMIN WITH MINERALS) TABS tablet Take 1 tablet by mouth daily. Patient not taking: No sig reported 05/18/20   Hansel Comer RAMAN, MD  Naphazoline HCl (CLEAR EYES OP) Apply  1 drop to eye daily as needed (dry red eyes).    [provider]  naproxen (NAPROSYN) 500 MG tablet Take 1 tablet (500 mg total) by mouth 2 (two) times daily. 11/27/23   Barrett, Jamie N, PA-C  nicotine  (NICODERM CQ  - DOSED IN MG/24 HR) 7 mg/24hr patch Place 1 patch (7 mg total) onto the skin daily. 10/18/20   Doda, Vandana, MD  omega-3 acid ethyl esters (LOVAZA ) 1 g capsule Take 1 capsule (1 g total) by mouth 2 (two) times daily. 10/17/20   Doda, Vandana, MD  oxyCODONE  (ROXICODONE ) 5 MG immediate release tablet Take 1 tablet (5 mg total) by mouth every 6 (six) hours as needed for up to 3 days for severe pain (pain score 7-10). 12/04/23 12/07/23  Franklyn Sid SAILOR, MD  predniSONE  (STERAPRED UNI-PAK 21 TAB) 10 MG (21) TBPK tablet Take by mouth daily. Take 6 tabs by mouth daily  for 2 days, then 5 tabs for 2 days, then 4 tabs for 2 days, then 3 tabs for 2 days, 2 tabs for 2 days, then 1 tab by mouth daily for 2 days 12/04/23   Franklyn Sid SAILOR, MD  QUEtiapine  (SEROQUEL ) 25 MG tablet Take 1 tablet (25 mg total) by mouth at bedtime. 10/17/20 11/16/20  Penne Lovelace, MD    Physical Exam: Vitals:   12/06/23 2045 12/06/23 2100 12/06/23 2131 12/06/23 2215  BP:  (!) 142/90 (!) 153/86   Pulse: 71  70 63 72  Resp:      Temp:      TempSrc:      SpO2: 97% 96% 92% 97%  Weight:      Height:       General: Awake and alert and oriented x3. Not in any acute distress.  HEENT: NCAT.  PERRLA. EOMI. Sclerae anicteric.  Moist mucosal membranes. Neck: Neck supple without lymphadenopathy. No carotid bruits. No masses palpated.  Cardiovascular: Regular rate with normal S1-S2 sounds. No murmurs, rubs or gallops auscultated. No JVD.  Respiratory: Clear breath sounds.  No accessory muscle use. Abdomen: Soft, nontender, nondistended. Active bowel sounds. No masses or hepatosplenomegaly  Skin: No rashes, lesions, or ulcerations.  Dry, warm to touch. Musculoskeletal:  2+ dorsalis pedis and radial pulses. Good ROM.  No contractures  Psychiatric: Intact judgment and insight.  Mood appropriate to current condition. Neurologic: No focal neurological deficits. Strength is 4/5 x 4.  CN II - XII grossly intact.  Assessment and Plan: Cervical spinal cord compression MRI cervical spine without contrast showed severe spinal canal stenosis and spinal cord compression at C5-6 due to a bulging disc osteophyte complex and caudal central disc extrusion Continue IV Dilaudid  0.5 mg every 3 hours as needed Continue Robaxin  Continue PT, OT eval and treat Neurosurgery (NP Meyran) consulted and recommended admitting patient to [facility] for neurosurgery team to consult on patient on arrival.  Depression with anxiety Continue Wellbutrin , Valium, Remeron   Tobacco abuse Patient was counseled on tobacco abuse cessation Continue nicotine  patch    Advance Care Planning: Full code  Consults: Neurosurgery (by [facility])  Family Communication: None at bedside  Severity of Illness: The appropriate patient status for this patient is INPATIENT. Inpatient status is judged to be reasonable and necessary in order to provide the required intensity of service to ensure the patient's safety. The patient's presenting symptoms,  physical exam findings, and initial radiographic and laboratory data in the context of their chronic comorbidities is felt to place them at high risk for further clinical  deterioration. Furthermore, it is not anticipated that the patient will be medically stable for discharge from the hospital within 2 midnights of admission.   * I certify that at the point of admission it is my clinical judgment that the patient will require inpatient hospital care spanning beyond 2 midnights from the point of admission due to high intensity of service, high risk for further deterioration and high frequency of surveillance required.*  Author: Ramirez Fullbright, DO 12/06/2023 11:58 PM  For on call review [email or web address].

## 2023-12-06 NOTE — ED Provider Notes (Signed)
 [facility] [facility] AT [facility] Provider Note  CSN: [id number] Arrival date & time: 12/06/23 1854  Chief Complaint(s) Neck Pain  HPI Brandon Miller is a 58 y.o. male history of diabetes, alcohol use presenting to the emergency department with neck pain.  Patient reports he fell and sisters bathroom around 3 weeks ago.  Had seen orthopedics, came to the ER, ultimately couple days ago had MRI performed which showed area of cord compression and was recommended to follow-up with a neurosurgeon.  Has been prescribed pain control and steroids and taking these.  He has an appointment on Monday.  He reports that his pain has been worsening and now he is feeling weak in his arms and legs which is new.  He reports that he has felt some trouble walking.  He denies any bowel or bladder incontinence.  Denies any trouble breathing.  He has had some tingling going down both arms which is unchanged.  Denies any fevers or chills.  Denies any new injury.   Past Medical History Past Medical History:  Diagnosis Date   Allergy    Anemia    Anxiety    Arthritis    RA   Complication of anesthesia    hard to wake up-was told he may have sleep apnea-never tested   Depression    Diabetes mellitus without complication (HCC)    with weight no more problems   Seizure (HCC)    had one seizure of unknown etiology a few tears ago, none since and on no meds for them   Sleep apnea    Snores    Patient Active Problem List   Diagnosis Date Noted   Neck pain 12/06/2023   Cognitive complaints 10/11/2020   Abnormal thyroid  screen (blood) 05/23/2020   Thrombocytopenia 05/23/2020   History of seizures 05/18/2020   Alcohol-induced mood disorder with depressive symptoms (HCC) 05/18/2020   Substance induced mood disorder (HCC) 05/17/2020   Subdural hematoma (HCC) 02/03/2020   Arthritis 01/09/2020   Gout 01/09/2020   Type II diabetes mellitus, uncontrolled 01/09/2020   Falls 08/10/2019    Numbness and tingling of upper and lower extremities of both sides 08/10/2019   Balance problem 08/10/2019   Medication side effect 08/10/2019   Alcohol withdrawal (HCC) 08/10/2019   GERD (gastroesophageal reflux disease) 02/06/2019   Attention deficit hyperactivity disorder (ADHD) 11/17/2018   Tobacco dependence 11/17/2018   Alcohol dependence (HCC) 11/17/2018   History of diet-controlled diabetes 11/17/2018   Elevated LFTs 11/05/2018   Acute gastroenteritis 10/31/2018   Sleep apnea    Generalized abdominal pain    Non-intractable vomiting    Diarrhea    ETOH abuse    Ileus, unspecified (HCC) 10/29/2018   Severe recurrent major depression without psychotic features (HCC) 08/15/2018   Alcohol withdrawal seizure with perceptual disturbance (HCC)    Alcohol abuse with unspecified alcohol-induced disorder 02/05/2018   Transaminasemia 02/05/2018   Anxiety and depression    Alcohol withdrawal seizure without complication (HCC) 02/04/2018   DJD (degenerative joint disease) of knee 08/25/2014   Diabetes mellitus, type 2 (HCC) 10/31/2011   Hyperglycemia 10/31/2011   PNA (pneumonia) 10/30/2011   LEG PAIN, RIGHT 01/20/2010   Home Medication(s) Prior to Admission medications   Medication Sig Start Date End Date Taking? Authorizing Provider  amphetamine -dextroamphetamine  (ADDERALL) 20 MG tablet Take 20 mg by mouth 2 (two) times daily. 09/14/20   [provider]  azithromycin  (ZITHROMAX ) 250 MG tablet Take 2 tablets together on the first  day, then 1 every day until finished. 11/15/22   Theadore Ozell HERO, MD  B Complex-C (B-COMPLEX WITH VITAMIN C) tablet Take 1 tablet by mouth daily. 10/18/20   Doda, Vandana, MD  benzocaine  (ORAJEL) 10 % mucosal gel Use as directed in the mouth or throat 2 (two) times daily as needed for mouth pain (toothpain). 10/17/20 11/16/20  Doda, Vandana, MD  buPROPion  (WELLBUTRIN  XL) 150 MG 24 hr tablet Take 150 mg by mouth every morning. 11/13/23   [provider]  cephALEXin  (KEFLEX ) 500 MG capsule Take 1 capsule (500 mg total) by mouth 4 (four) times daily. 03/21/23   Neysa Caron PARAS, DO  colchicine  0.6 MG tablet Take 1 tablet (0.6 mg total) by mouth 2 (two) times daily. 08/19/22   Cleotilde Rogue, MD  diazepam (VALIUM) 5 MG tablet Take 0.5 tablets (2.5 mg total) by mouth every 12 (twelve) hours as needed for anxiety. 11/27/23   Barrett, Warren SAILOR, PA-C  gabapentin  (NEURONTIN ) 600 MG tablet Take 600 mg by mouth 3 (three) times daily.    [provider]  lidocaine  (LIDODERM ) 5 % Place 1 patch onto the skin daily. Remove & Discard patch within 12 hours or as directed by MD 11/27/23   Barrett, Warren SAILOR, PA-C  methocarbamol  (ROBAXIN ) 500 MG tablet Take 1 tablet (500 mg total) by mouth 2 (two) times daily. 12/04/23   Franklyn Sid SAILOR, MD  mirtazapine  (REMERON ) 30 MG tablet Take 1 tablet (30 mg total) by mouth at bedtime. 10/17/20 11/21/26  Doda, Vandana, MD  mirtazapine  (REMERON ) 45 MG tablet Take 45 mg by mouth at bedtime. 09/27/23   [provider]  Multiple Vitamin (MULTIVITAMIN WITH MINERALS) TABS tablet Take 1 tablet by mouth daily. Patient not taking: No sig reported 05/18/20   Hansel Comer RAMAN, MD  Naphazoline HCl (CLEAR EYES OP) Apply 1 drop to eye daily as needed (dry red eyes).    [provider]  naproxen (NAPROSYN) 500 MG tablet Take 1 tablet (500 mg total) by mouth 2 (two) times daily. 11/27/23   Barrett, Jamie N, PA-C  nicotine  (NICODERM CQ  - DOSED IN MG/24 HR) 7 mg/24hr patch Place 1 patch (7 mg total) onto the skin daily. 10/18/20   Doda, Vandana, MD  omega-3 acid ethyl esters (LOVAZA ) 1 g capsule Take 1 capsule (1 g total) by mouth 2 (two) times daily. 10/17/20   Doda, Vandana, MD  oxyCODONE  (ROXICODONE ) 5 MG immediate release tablet Take 1 tablet (5 mg total) by mouth every 6 (six) hours as needed for up to 3 days for severe pain (pain score 7-10). 12/04/23 12/07/23  Franklyn Sid SAILOR, MD  predniSONE  (STERAPRED UNI-PAK  21 TAB) 10 MG (21) TBPK tablet Take by mouth daily. Take 6 tabs by mouth daily  for 2 days, then 5 tabs for 2 days, then 4 tabs for 2 days, then 3 tabs for 2 days, 2 tabs for 2 days, then 1 tab by mouth daily for 2 days 12/04/23   Franklyn Sid SAILOR, MD  QUEtiapine  (SEROQUEL ) 25 MG tablet Take 1 tablet (25 mg total) by mouth at bedtime. 10/17/20 11/16/20  Doda, Vandana, MD  Past Surgical History Past Surgical History:  Procedure Laterality Date   BIOPSY  02/12/2019   Procedure: BIOPSY;  Surgeon: Shaaron Lamar HERO, MD;  Location: [facility];  Service: Endoscopy;;  stomach   COLONOSCOPY WITH PROPOFOL  N/A 02/12/2019   Procedure: COLONOSCOPY WITH PROPOFOL ;  Surgeon: Shaaron Lamar HERO, MD;  Location: [facility];  Service: Endoscopy;  Laterality: N/A;  10:30am   ELBOW SURGERY Right 2010   Dr. Beverley   ESOPHAGOGASTRODUODENOSCOPY (EGD) WITH PROPOFOL  N/A 02/12/2019   Procedure: ESOPHAGOGASTRODUODENOSCOPY (EGD) WITH PROPOFOL ;  Surgeon: Shaaron Lamar HERO, MD;  Location: [facility];  Service: Endoscopy;  Laterality: N/A;   HERNIA REPAIR     Right inguinal   KNEE ARTHROSCOPY WITH MEDIAL MENISECTOMY Right 06/11/2013   Procedure: RIGHT KNEE ARTHROSCOPY WITH MEDIAL MENISECTOMY AND CHONDROPLASTY;  Surgeon: Toribio JULIANNA Beverley, MD;  Location: [facility];  Service: Orthopedics;  Laterality: Right;  Chondroplasty, loose bodies, medial menisectomy, lysis of adhesions.   KNEE SURGERY Right 2011   POLYPECTOMY  02/12/2019   Procedure: POLYPECTOMY;  Surgeon: Shaaron Lamar HERO, MD;  Location: [facility];  Service: Endoscopy;;   right ankle     SHOULDER SURGERY Right 2010   Dr. Beverley   TOTAL KNEE ARTHROPLASTY Right 08/25/2014   Procedure: TOTAL KNEE ARTHROPLASTY;  Surgeon: Toribio JULIANNA Beverley, MD;  Location: [facility];  Service: Orthopedics;  Laterality: Right;   Family History Family  History  Problem Relation Age of Onset   Hepatitis Mother    Cirrhosis Mother        Did not drink   Pneumonia Father    Kidney failure Father    Gastric cancer Brother        ? Gastric CA spread to esophagus   Colon cancer Neg Hx     Social History Social History   Tobacco Use   Smoking status: Every Day    Current packs/day: 1.50    Average packs/day: 1.5 packs/day for 25.0 years (37.5 ttl pk-yrs)    Types: Cigarettes   Smokeless tobacco: Never  Vaping Use   Vaping status: Never Used  Substance Use Topics   Alcohol use: Yes    Comment: beer daily   Drug use: No   Allergies Tizanidine hcl, Allopurinol , Indomethacin, Prednisone , Daypro [oxaprozin], and Diclofenac sodium  Review of Systems Review of Systems  All other systems reviewed and are negative.   Physical Exam Vital Signs  I have reviewed the triage vital signs BP (!) 153/86   Pulse 72   Temp 98.6 F (37 C) (Oral)   Resp 18   Ht 6' 3 (1.905 m)   Wt 104.3 kg   SpO2 97%   BMI 28.74 kg/m  Physical Exam Vitals and nursing note reviewed.  Constitutional:      General: He is not in acute distress.    Appearance: Normal appearance.  HENT:     Mouth/Throat:     Mouth: Mucous membranes are moist.  Eyes:     Conjunctiva/sclera: Conjunctivae normal.  Cardiovascular:     Rate and Rhythm: Normal rate and regular rhythm.  Pulmonary:     Effort: Pulmonary effort is normal. No respiratory distress.     Breath sounds: Normal breath sounds.  Abdominal:     General: Abdomen is flat.     Palpations: Abdomen is soft.     Tenderness: There is no abdominal tenderness.  Musculoskeletal:     Right lower leg: No edema.     Left lower  leg: No edema.     Comments: No midline C, T, L-spine tenderness  Skin:    General: Skin is warm and dry.     Capillary Refill: Capillary refill takes less than 2 seconds.  Neurological:     Mental Status: He is alert and oriented to person, place, and time. Mental status is at  baseline.     Comments: Strength seems globally and symmetrically diminished, 4+ out of 5 in bilateral upper and lower extremities.  No objective sensory deficits.  No cranial nerve deficit. 2+ patellar reflexes  Psychiatric:        Mood and Affect: Mood normal.        Behavior: Behavior normal.     ED Results and Treatments Labs (all labs ordered are listed, but only abnormal results are displayed) Labs Reviewed  BASIC METABOLIC PANEL WITH GFR - Abnormal; Notable for the following components:      Result Value   Glucose, Bld 102 (*)    Calcium  8.8 (*)    All other components within normal limits  CBC WITH DIFFERENTIAL/PLATELET - Abnormal; Notable for the following components:   Platelets 111 (*)    All other components within normal limits                                                                                                                          Radiology No results found.  Pertinent labs & imaging results that were available during my care of the patient were reviewed by me and considered in my medical decision making (see MDM for details).  Medications Ordered in ED Medications  HYDROmorphone  (DILAUDID ) injection 1 mg (1 mg Intravenous Given 12/06/23 2046)  sodium chloride  0.9 % bolus 1,000 mL (1,000 mLs Intravenous New Bag/Given 12/06/23 2044)                                                                                                                                     Procedures Procedures  (including critical care time)  Medical Decision Making / ED Course   MDM:  58 year old presenting to the emergency department with neck pain.  Symptoms have been worsening.  On exam he does seem to have some mild diffuse weakness.  He denies any new injury.  Symptoms are concerning for progression of his injury.  Discussed with neurosurgery, NP meyran, given he had an MRI 2 days  ago they do not think he needs a repeat MRI but patient may benefit from further  expedited workup, IV steroid, PT eval. They will consult on patient. Request he be admitted to [facility].     Clinical Course as of 12/06/23 2249  Fri Dec 06, 2023  2247 Signed out to Dr. Adefeso for admission to [facility] for progressive neck pain. Bladder scan reassuring.  [WS]    Clinical Course User Index [WS] Francesca Elsie CROME, MD     Additional history obtained: -External records from outside source obtained and reviewed including: Chart review including previous notes, labs, imaging, consultation notes including prior ER notes    Lab Tests: -I ordered, reviewed, and interpreted labs.   The pertinent results include:   Labs Reviewed  BASIC METABOLIC PANEL WITH GFR - Abnormal; Notable for the following components:      Result Value   Glucose, Bld 102 (*)    Calcium  8.8 (*)    All other components within normal limits  CBC WITH DIFFERENTIAL/PLATELET - Abnormal; Notable for the following components:   Platelets 111 (*)    All other components within normal limits    Notable for mild thrombocytopenia   Medicines ordered and prescription drug management: Meds ordered this encounter  Medications   HYDROmorphone  (DILAUDID ) injection 1 mg   sodium chloride  0.9 % bolus 1,000 mL    -I have reviewed the patients home medicines and have made adjustments as needed   Consultations Obtained: I requested consultation with the neurosurgeon,  and discussed lab and imaging findings as well as pertinent plan - they recommend: admit for evaluation    Reevaluation: After the interventions noted above, I reevaluated the patient and found that their symptoms have improved  Co morbidities that complicate the patient evaluation  Past Medical History:  Diagnosis Date   Allergy    Anemia    Anxiety    Arthritis    RA   Complication of anesthesia    hard to wake up-was told he may have sleep apnea-never tested   Depression    Diabetes mellitus without complication (HCC)    with  weight no more problems   Seizure (HCC)    had one seizure of unknown etiology a few tears ago, none since and on no meds for them   Sleep apnea    Snores       Dispostion: Disposition decision including need for hospitalization was considered, and patient discharged from emergency department.    Final Clinical Impression(s) / ED Diagnoses Final diagnoses:  Cervical spinal cord compression [facility])     This chart was dictated using voice recognition software.  Despite best efforts to proofread,  errors can occur which can change the documentation meaning.    Francesca Elsie CROME, MD 12/06/23 2249

## 2023-12-06 NOTE — ED Notes (Signed)
 Pts bladder scan post void was 1ml.

## 2023-12-07 DIAGNOSIS — F1020 Alcohol dependence, uncomplicated: Secondary | ICD-10-CM

## 2023-12-07 DIAGNOSIS — G9520 Unspecified cord compression: Principal | ICD-10-CM

## 2023-12-07 LAB — CBC
HCT: 41.3 % (ref 39.0–52.0)
Hemoglobin: 14.6 g/dL (ref 13.0–17.0)
MCH: 32.7 pg (ref 26.0–34.0)
MCHC: 35.4 g/dL (ref 30.0–36.0)
MCV: 92.4 fL (ref 80.0–100.0)
Platelets: 98 K/uL — ABNORMAL LOW (ref 150–400)
RBC: 4.47 MIL/uL (ref 4.22–5.81)
RDW: 13.3 % (ref 11.5–15.5)
WBC: 6.4 K/uL (ref 4.0–10.5)
nRBC: 0 % (ref 0.0–0.2)

## 2023-12-07 LAB — COMPREHENSIVE METABOLIC PANEL WITH GFR
ALT: 94 U/L — ABNORMAL HIGH (ref 0–44)
AST: 97 U/L — ABNORMAL HIGH (ref 15–41)
Albumin: 4 g/dL (ref 3.5–5.0)
Alkaline Phosphatase: 92 U/L (ref 38–126)
Anion gap: 10 (ref 5–15)
BUN: 19 mg/dL (ref 6–20)
CO2: 25 mmol/L (ref 22–32)
Calcium: 8.4 mg/dL — ABNORMAL LOW (ref 8.9–10.3)
Chloride: 102 mmol/L (ref 98–111)
Creatinine, Ser: 0.92 mg/dL (ref 0.61–1.24)
GFR, Estimated: >60 mL/min (ref >60)
Glucose, Bld: 97 mg/dL (ref 70–99)
Potassium: 3.9 mmol/L (ref 3.5–5.1)
Sodium: 137 mmol/L (ref 135–145)
Total Bilirubin: 1.5 mg/dL — ABNORMAL HIGH (ref 0.0–1.2)
Total Protein: 6.2 g/dL — ABNORMAL LOW (ref 6.5–8.1)

## 2023-12-07 LAB — PHOSPHORUS: Phosphorus: 3.5 mg/dL (ref 2.5–4.6)

## 2023-12-07 LAB — MAGNESIUM: Magnesium: 2.6 mg/dL — ABNORMAL HIGH (ref 1.7–2.4)

## 2023-12-07 MED ORDER — NICOTINE 7 MG/24HR TD PT24
7.0000 mg | MEDICATED_PATCH | Freq: Every day | TRANSDERMAL | Status: DC
Start: 2023-12-07 — End: 2023-12-10
  Administered 2023-12-07 – 2023-12-10 (×4): 7 mg via TRANSDERMAL
  Filled 2023-12-07 (×5): qty 1

## 2023-12-07 MED ORDER — LORAZEPAM 1 MG PO TABS
1.0000 mg | ORAL_TABLET | ORAL | Status: AC | PRN
  Administered 2023-12-07 – 2023-12-09 (×3): 1 mg via ORAL
  Filled 2023-12-07 (×3): qty 1

## 2023-12-07 MED ORDER — LORAZEPAM 2 MG/ML IJ SOLN: 1.0000 mg | INTRAMUSCULAR | Status: AC | PRN

## 2023-12-07 MED ORDER — HYDROMORPHONE HCL 1 MG/ML IJ SOLN
0.5000 mg | INTRAMUSCULAR | Status: DC | PRN
  Administered 2023-12-07 – 2023-12-09 (×13): 0.5 mg via INTRAVENOUS
  Filled 2023-12-07 (×2): qty 1
  Filled 2023-12-07 (×2): qty 0.5
  Filled 2023-12-07: qty 1
  Filled 2023-12-07: qty 0.5
  Filled 2023-12-07 (×5): qty 1
  Filled 2023-12-07 (×2): qty 0.5

## 2023-12-07 MED ORDER — THIAMINE HCL 100 MG/ML IJ SOLN: 100.0000 mg | Freq: Every day | INTRAMUSCULAR | Status: DC

## 2023-12-07 MED ORDER — HEPARIN SODIUM (PORCINE) 5000 UNIT/ML IJ SOLN
5000.0000 [IU] | Freq: Three times a day (TID) | INTRAMUSCULAR | Status: DC
  Administered 2023-12-07 – 2023-12-09 (×7): 5000 [IU] via SUBCUTANEOUS
  Filled 2023-12-07 (×7): qty 1

## 2023-12-07 MED ORDER — ACETAMINOPHEN 325 MG PO TABS
650.0000 mg | ORAL_TABLET | Freq: Four times a day (QID) | ORAL | Status: DC | PRN
  Administered 2023-12-07 – 2023-12-10 (×3): 650 mg via ORAL
  Filled 2023-12-07 (×3): qty 2

## 2023-12-07 MED ORDER — ENSURE PLUS HIGH PROTEIN PO LIQD
237.0000 mL | Freq: Two times a day (BID) | ORAL | Status: DC
  Administered 2023-12-08: 237 mL via ORAL

## 2023-12-07 MED ORDER — MIRTAZAPINE 15 MG PO TABS
45.0000 mg | ORAL_TABLET | Freq: Every day | ORAL | Status: DC
  Administered 2023-12-09: 45 mg via ORAL
  Filled 2023-12-07: qty 3

## 2023-12-07 MED ORDER — ACETAMINOPHEN 650 MG RE SUPP: 650.0000 mg | Freq: Four times a day (QID) | RECTAL | Status: DC | PRN

## 2023-12-07 MED ORDER — DIAZEPAM 5 MG PO TABS: 2.5000 mg | ORAL_TABLET | Freq: Two times a day (BID) | ORAL | Status: DC | PRN

## 2023-12-07 MED ORDER — THIAMINE MONONITRATE 100 MG PO TABS
100.0000 mg | ORAL_TABLET | Freq: Every day | ORAL | Status: DC
  Administered 2023-12-07 – 2023-12-10 (×4): 100 mg via ORAL
  Filled 2023-12-07 (×4): qty 1

## 2023-12-07 MED ORDER — METHOCARBAMOL 500 MG PO TABS
500.0000 mg | ORAL_TABLET | Freq: Two times a day (BID) | ORAL | Status: DC
  Administered 2023-12-07 – 2023-12-10 (×8): 500 mg via ORAL
  Filled 2023-12-07 (×8): qty 1

## 2023-12-07 MED ORDER — ONDANSETRON HCL 4 MG PO TABS: 4.0000 mg | ORAL_TABLET | Freq: Four times a day (QID) | ORAL | Status: DC | PRN

## 2023-12-07 MED ORDER — BUPROPION HCL ER (XL) 150 MG PO TB24
150.0000 mg | ORAL_TABLET | Freq: Every morning | ORAL | Status: DC
  Administered 2023-12-07 – 2023-12-10 (×4): 150 mg via ORAL
  Filled 2023-12-07 (×4): qty 1

## 2023-12-07 MED ORDER — ADULT MULTIVITAMIN W/MINERALS CH
1.0000 | ORAL_TABLET | Freq: Every day | ORAL | Status: DC
  Administered 2023-12-07 – 2023-12-10 (×4): 1 via ORAL
  Filled 2023-12-07 (×4): qty 1

## 2023-12-07 MED ORDER — FOLIC ACID 1 MG PO TABS
1.0000 mg | ORAL_TABLET | Freq: Every day | ORAL | Status: DC
  Administered 2023-12-07 – 2023-12-10 (×4): 1 mg via ORAL
  Filled 2023-12-07 (×4): qty 1

## 2023-12-07 MED ORDER — ONDANSETRON HCL 4 MG/2ML IJ SOLN
4.0000 mg | Freq: Four times a day (QID) | INTRAMUSCULAR | Status: DC | PRN
Start: 2023-12-07 — End: 2023-12-10

## 2023-12-07 NOTE — Progress Notes (Signed)
 PROGRESS NOTE  Brandon Miller FMW:[id number] DOB: May 13, 1965 DOA: 12/06/2023 PCP: [facility]  Brief History:   58 y.o. male with medical history significant of anxiety, depression, tobacco abuse who presents to the emergency department due to worsening neck pain, right arm weakness, and leg weakness. The patient sustained a mechanical fall about 3 weeks prior to this admission.  He initially complained of shoulder pain, but eventually developed some neck pain with radiation to the arms.  He was seen in the ED and treated with Valium, Toradol  and lidocaine  patch.  He was discharged with Lidoderm , Valium and naproxen.  Patient has since followed up with orthopedic Provider where he received injections to right shoulder and neck without any improvement.  He came to the emergency department on 12/04/2023.  At that time, MRI of cervical spine shows C3-C4 severe DDD and central stenosis due to left paracentric disc osteophyte and C5-6 severe stenosis.  Neurosurgery (Dr. Darnella) consulted and recommended Medrol  Dosepak and pain medications and for outpatient follow-up on Monday 11/17.  He presented to the ER today due to worsening pain with weakness in her arms and legs and some trouble walking, but denies urinary/bowel incontinence.  Patient denies any new falls.   ED course In the emergency department, he was afebrile hemodynamically stable.  WBC 8.1, hemoglobin 16.0, was 159.  Sodium 137, potassium 3.9, bicarbonate 25, serum creatinine 0.92.  AST 97, ALT 94, alk phosphatase 92, total bilirubin 1.5.  Dilaudid  was given, IV hydration was provided. Neurosurgery (NP Meyran) was consulted who recommended patient be admitted to [facility] and that neurosurgery team will consult on him on arrival.     Assessment/Plan: Cervical myelopathy/cervical cord compression -12/04/2023 MRI cervical spine as discussed above - Continue IV Dilaudid  as needed  pain -Neurosurgery (NP Meyran) consulted and recommended admitting patient to [facility] for neurosurgery team to consult on patient on arrival.   Depression with anxiety Continue Wellbutrin , Valium, Remeron    Tobacco abuse Patient was counseled on tobacco abuse cessation Continue nicotine  patch  Alcohol dependence - Patient drinks 4-5 beers daily - Alcohol withdrawal protocol    Family Communication:  no Family at bedside  Consultants:  neurosurgery  Code Status:  FULL  DVT Prophylaxis:  [place] Heparin    Procedures: As Listed in Progress Note Above  Antibiotics: None     Subjective: Patient complains of neck pain and weakness in the right arm.  He states his legs are little bit weak.  He denies any chest pain, short of breath, nausea, vomiting, diarrhea, abdominal pain.  Objective: Vitals:   12/07/23 0400 12/07/23 0600 12/07/23 0607 12/07/23 0700  BP: (!) 148/86 (!) 148/81  (!) 146/84  Pulse: (!) 50 (!) 47  (!) 55  Resp: 14 16  14   Temp:   98.2 F (36.8 C)   TempSrc:   Oral   SpO2: 97% 95%  97%  Weight:      Height:        Intake/Output Summary (Last 24 hours) at 12/07/2023 9188 Last data filed at 12/06/2023 2106 Gross per 24 hour  Intake --  Output 500 ml  Net -500 ml   Weight change:  Exam:  General:  Pt is alert, follows commands appropriately, not in acute distress HEENT: No icterus, No thrush, No neck mass, [place]/AT Cardiovascular: RRR, S1/S2, no rubs, no gallops Respiratory: Diminished breath sounds but clear to auscultation.  No wheezing Abdomen: Soft/+BS,  non tender, non distended, no guarding Extremities: No edema, No lymphangitis, No petechiae, No rashes, no synovitis Neuro:  CN II-XII intact, strength 4-/5 in RUE, RLE, strength 5/5 LUE, LLE; sensation intact bilateral; no dysmetria; babinski equivocal    Data Reviewed: I have personally reviewed following labs and imaging studies Basic Metabolic Panel: Recent Labs  Lab 12/04/23 0743  12/06/23 2052 12/07/23 0526  NA 139 135 137  K 4.0 3.8 3.9  CL 108 99 102  CO2  --  24 25  GLUCOSE 123* 102* 97  BUN 25* 19 19  CREATININE 1.30* 0.90 0.92  CALCIUM   --  8.8* 8.4*  MG  --   --  2.6*  PHOS  --   --  3.5   Liver Function Tests: Recent Labs  Lab 12/07/23 0526  AST 97*  ALT 94*  ALKPHOS 92  BILITOT 1.5*  PROT 6.2*  ALBUMIN 4.0   No results for input(s): LIPASE, AMYLASE in the last 168 hours. No results for input(s): AMMONIA in the last 168 hours. Coagulation Profile: No results for input(s): INR, PROTIME in the last 168 hours. CBC: Recent Labs  Lab 12/04/23 0743 12/06/23 2052 12/07/23 0526  WBC  --  8.1 6.4  NEUTROABS  --  5.7  --   HGB 17.0 16.0 14.6  HCT 50.0 44.4 41.3  MCV  --  90.4 92.4  PLT  --  111* 98*   Cardiac Enzymes: No results for input(s): CKTOTAL, CKMB, CKMBINDEX, TROPONINI in the last 168 hours. BNP: Invalid input(s): POCBNP CBG: No results for input(s): GLUCAP in the last 168 hours. HbA1C: No results for input(s): HGBA1C in the last 72 hours. Urine analysis:    Component Value Date/Time   COLORURINE COLORLESS (A) 08/20/2019 1114   APPEARANCEUR CLEAR 08/20/2019 1114   LABSPEC 1.001 (L) 08/20/2019 1114   PHURINE 6.0 08/20/2019 1114   GLUCOSEU NEGATIVE 08/20/2019 1114   HGBUR NEGATIVE 08/20/2019 1114   BILIRUBINUR NEGATIVE 08/20/2019 1114   KETONESUR NEGATIVE 08/20/2019 1114   PROTEINUR NEGATIVE 08/20/2019 1114   NITRITE NEGATIVE 08/20/2019 1114   LEUKOCYTESUR NEGATIVE 08/20/2019 1114   Sepsis Labs: @LABRCNTIP (procalcitonin:4,lacticidven:4) )No results found for this or any previous visit (from the past 240 hours).   Scheduled Meds:  buPROPion   150 mg Oral q morning   heparin  5,000 Units Subcutaneous Q8H   methocarbamol   500 mg Oral BID   mirtazapine   45 mg Oral QHS   nicotine   7 mg Transdermal Daily   Continuous Infusions:  Procedures/Studies: MR Cervical Spine Wo Contrast Result Date:  12/04/2023 EXAM: MRI CERVICAL SPINE WITHOUT CONTRAST 12/04/2023 12:09:02 PM TECHNIQUE: Multiplanar multisequence MRI of the cervical spine was performed. COMPARISON: CT of the cervical spine dated 11/21/2023. CLINICAL HISTORY: Myelopathy, acute, cervical spine. FINDINGS: BONES AND ALIGNMENT: Straightening of the normal cervical lordosis. Normal vertebral body heights. Bone marrow signal is unremarkable. SPINAL CORD: Normal spinal cord size. At C5-C6, there is compression of the spinal cord which demonstrates mildly increased T2 signal. SOFT TISSUES: No paraspinal mass. C2-C3: No significant disc herniation. No spinal canal stenosis or neural foraminal narrowing. C3-C4: There is a left posterolateral disc osteophyte complex present, which is causing severe left-sided spinal canal stenosis and severe left neural foraminal stenosis. It is compressing the spinal cord on the left and is also likely compressing the left C4 nerve in the neural foramen. There is also moderate right-sided spinal canal and neural foraminal stenosis at this level. C4-C5: There is mild left facet hypertrophy contributing  to mild left neural foraminal stenosis. The central spinal canal and right neural foramina are patent. C5-C6: There is a bulging disc osteophyte complex and a caudal central disc extrusion. There is severe spinal canal stenosis and compression of the spinal cord which demonstrates mildly increased T2 signal. There is mild-to-moderate bilateral neuroforaminal stenosis. C6-C7: No significant disc herniation. No spinal canal stenosis or neural foraminal narrowing. C7-T1: The spinal canal and neural foramina are patent. IMPRESSION: 1. Severe spinal canal stenosis and spinal cord compression at C5-6 due to a bulging disc osteophyte complex and caudal central disc extrusion, with associated mildly increased T2 signal in the cord. Mild-to-moderate bilateral neuroforaminal stenosis at this level. 2. Severe left-sided spinal canal  stenosis and severe left neural foraminal stenosis at C3-4 due to a left posterolateral disc osteophyte complex, resulting in left spinal cord compression and likely left C4 nerve compression. Moderate right-sided spinal canal and neural foraminal stenosis at this level. 3. Mild left neural foraminal stenosis at C4-5 due to mild left facet hypertrophy. Central canal and right neural foramina are patent. Electronically signed by: Evalene Coho MD 12/04/2023 12:54 PM EST RP Workstation: [id number]   CT ANGIO HEAD NECK W WO CM Result Date: 12/04/2023 EXAM: CTA HEAD AND NECK WITHOUT AND WITH 12/04/2023 08:52:17 AM TECHNIQUE: CTA of the head and neck was performed without and with the administration of 75 mL of iohexol  (OMNIPAQUE ) 350 MG/ML injection. Multiplanar 2D and/or 3D reformatted images are provided for review. Automated exposure control, iterative reconstruction, and/or weight based adjustment of the mA/kV was utilized to reduce the radiation dose to as low as reasonably achievable. Stenosis of the internal carotid arteries measured using NASCET criteria. COMPARISON: CT of the head dated 01/21/2020. CLINICAL HISTORY: severe right sided neck/head pain, ?traumatic. FINDINGS: CTA NECK: AORTIC ARCH AND ARCH VESSELS: No dissection or arterial injury. No significant stenosis of the brachiocephalic or subclavian arteries. CERVICAL CAROTID ARTERIES: Mild calcific plaque within the proximal right internal carotid artery, with 0% luminal stenosis. Mild calcific plaque present within the posterior wall of the origin left internal carotid artery, also with no associated luminal stenosis. No dissection, arterial injury, or hemodynamically significant stenosis by NASCET criteria. CERVICAL VERTEBRAL ARTERIES: The left vertebral artery is dominant and the right is relatively hypoplastic. Both vertebral arteries are uniform in caliber throughout their respective courses. No dissection, arterial injury, or significant  stenosis. LUNGS AND MEDIASTINUM: Unremarkable. SOFT TISSUES: No acute abnormality. BONES: No acute abnormality. CTA HEAD: ANTERIOR CIRCULATION: No significant stenosis of the internal carotid arteries. No significant stenosis of the anterior cerebral arteries. No significant stenosis of the middle cerebral arteries. No aneurysm. POSTERIOR CIRCULATION: No significant stenosis of the posterior cerebral arteries. No significant stenosis of the basilar artery. No significant stenosis of the vertebral arteries. No aneurysm. OTHER: No dural venous sinus thrombosis on this non-dedicated study. IMPRESSION: 1. No large vessel occlusion, hemodynamically significant stenosis, or aneurysm in the head or neck 2. Mild calcific plaque within the proximal right internal carotid artery and at the posterior wall of the left internal carotid artery origin, both with 0% luminal stenosis 3. Dominant left vertebral artery with relatively hypoplastic right vertebral artery Electronically signed by: Evalene Coho MD 12/04/2023 09:05 AM EST RP Workstation: [id number]   CT Chest Wo Contrast Result Date: 11/21/2023 CLINICAL DATA:  Blunt trauma to the chest. EXAM: CT CHEST WITHOUT CONTRAST TECHNIQUE: Multidetector CT imaging of the chest was performed following the standard protocol without IV contrast. RADIATION DOSE REDUCTION: This exam was performed  according to the departmental dose-optimization program which includes automated exposure control, adjustment of the mA and/or kV according to patient size and/or use of iterative reconstruction technique. COMPARISON:  Chest CT dated 11/14/2022. FINDINGS: Evaluation of this exam is limited in the absence of intravenous contrast. Cardiovascular: There is no cardiomegaly or pericardial effusion. There is coronary vascular calcification. The thoracic aorta and central pulmonary arteries are grossly unremarkable on this noncontrast CT. Mediastinum/Nodes: No hilar or mediastinal adenopathy.  The esophagus is grossly unremarkable. No mediastinal fluid collection. Lungs/Pleura: No focal consolidation, pleural effusion or pneumothorax. The central airways are patent. Upper Abdomen: No acute abnormality. Musculoskeletal: No acute osseous pathology. IMPRESSION: 1. No acute intrathoracic pathology. 2. Coronary vascular calcification. Electronically Signed   By: Vanetta Chou M.D.   On: 11/21/2023 19:32   CT Cervical Spine Wo Contrast Result Date: 11/21/2023 EXAM: CT CERVICAL SPINE WITHOUT CONTRAST 11/21/2023 07:23:49 PM TECHNIQUE: CT of the cervical spine was performed without the administration of intravenous contrast. Multiplanar reformatted images are provided for review. Automated exposure control, iterative reconstruction, and/or weight based adjustment of the mA/kV was utilized to reduce the radiation dose to as low as reasonably achievable. COMPARISON: None available. CLINICAL HISTORY: Neck trauma, midline tenderness (Age 73-64y) FINDINGS: CERVICAL SPINE: BONES AND ALIGNMENT: No acute fracture or traumatic malalignment. DEGENERATIVE CHANGES: Moderate degenerative changes at the C3-C4 level. No associated severe osseous neural foraminal or central canal stenosis. SOFT TISSUES: No prevertebral soft tissue swelling. IMPRESSION: 1. No acute abnormality of the cervical spine. Electronically signed by: Kate Plummer MD 11/21/2023 07:28 PM EDT RP Workstation: [id number]   DG Shoulder Right Result Date: 11/21/2023 CLINICAL DATA:  Right shoulder pain.  Fall 6 days ago. EXAM: RIGHT SHOULDER - 2+ VIEW COMPARISON:  Humerus CT 03/04/2023 FINDINGS: There is no evidence of fracture or dislocation. Mild glenohumeral and acromioclavicular degenerative change. Soft tissues are unremarkable. IMPRESSION: No fracture or dislocation of the right shoulder. Mild degenerative change. Electronically Signed   By: Andrea Gasman M.D.   On: 11/21/2023 18:28    Alm Schneider, DO  [facility]  If 7PM-7AM,  please contact night-coverage [email or web address] Password [email or web address] 12/07/2023, 8:11 AM   LOS: 1 day

## 2023-12-07 NOTE — ED Notes (Signed)
 This RN walked into the pts room to find that he was up walking around. Pt was educated for the second time on the safety reason why not to get out of bed due to his weakness in legs and he was safely helped back in bed with call bell at bedside.

## 2023-12-07 NOTE — ED Notes (Signed)
 This RN educated the pt on not getting out of bed due to him having weakness in his legs. Pt endorsed he understood and would call if he need to get out of bed.

## 2023-12-07 NOTE — Plan of Care (Signed)
   Problem: Education: Goal: Knowledge of General Education information will improve Description Including pain rating scale, medication(s)/side effects and non-pharmacologic comfort measures Outcome: Progressing   Problem: Health Behavior/Discharge Planning: Goal: Ability to manage health-related needs will improve Outcome: Progressing

## 2023-12-07 NOTE — Hospital Course (Signed)
 58 y.o. male with medical history significant of anxiety, depression, tobacco abuse who presents to the emergency department due to worsening neck pain, right arm weakness, and leg weakness. The patient sustained a mechanical fall about 3 weeks prior to this admission.  He initially complained of shoulder pain, but eventually developed some neck pain with radiation to the arms.  He was seen in the ED and treated with Valium, Toradol  and lidocaine  patch.  He was discharged with Lidoderm , Valium and naproxen.  Patient has since followed up with orthopedic Provider where he received injections to right shoulder and neck without any improvement.  He came to the emergency department on 12/04/2023.  At that time, MRI of cervical spine shows C3-C4 severe DDD and central stenosis due to left paracentric disc osteophyte and C5-6 severe stenosis.  Neurosurgery (Dr. Darnella) consulted and recommended Medrol  Dosepak and pain medications and for outpatient follow-up on Monday 11/17.  He presented to the ER today due to worsening pain with weakness in her arms and legs and some trouble walking, but denies urinary/bowel incontinence.  Patient denies any new falls.   ED course In the emergency department, he was afebrile hemodynamically stable.  WBC 8.1, hemoglobin 16.0, was 159.  Sodium 137, potassium 3.9, bicarbonate 25, serum creatinine 0.92.  AST 97, ALT 94, alk phosphatase 92, total bilirubin 1.5.  Dilaudid  was given, IV hydration was provided. Neurosurgery (NP Meyran) was consulted who recommended patient be admitted to [facility] and that neurosurgery team will consult on him on arrival.

## 2023-12-07 NOTE — ED Notes (Signed)
 This RN noted the pt was up out of bed getting water out of the sink. This RN walked into the pts room and safely helped him back in bed with call bell at his bedside. Pt was again educated on not getting out of bed for his safety, due to his leg weakness, with out calling for assistance. Pt endorsed he understood and would call if he needed help.

## 2023-12-08 ENCOUNTER — Inpatient Hospital Stay (HOSPITAL_COMMUNITY)

## 2023-12-08 ENCOUNTER — Encounter (HOSPITAL_COMMUNITY): Payer: Self-pay | Admitting: Internal Medicine

## 2023-12-08 DIAGNOSIS — M542 Cervicalgia: Secondary | ICD-10-CM | POA: Diagnosis not present

## 2023-12-08 LAB — PROTIME-INR
INR: 0.9 (ref 0.8–1.2)
Prothrombin Time: 12.8 s (ref 11.4–15.2)

## 2023-12-08 LAB — MISC LABCORP TEST (SEND OUT): Labcorp test code: 83935

## 2023-12-08 LAB — URIC ACID: Uric Acid, Serum: 6.9 mg/dL (ref 3.7–8.6)

## 2023-12-08 LAB — APTT: aPTT: 29 s (ref 24–36)

## 2023-12-08 MED ORDER — COLCHICINE 0.6 MG PO TABS
1.2000 mg | ORAL_TABLET | ORAL | Status: AC
  Administered 2023-12-08: 1.2 mg via ORAL
  Filled 2023-12-08: qty 2

## 2023-12-08 MED ORDER — OXYCODONE HCL 5 MG PO TABS
5.0000 mg | ORAL_TABLET | ORAL | Status: DC | PRN
  Administered 2023-12-08 – 2023-12-10 (×5): 5 mg via ORAL
  Filled 2023-12-08 (×5): qty 1

## 2023-12-08 MED ORDER — COLCHICINE 0.6 MG PO TABS
0.6000 mg | ORAL_TABLET | Freq: Every day | ORAL | Status: DC
  Administered 2023-12-08 – 2023-12-10 (×3): 0.6 mg via ORAL
  Filled 2023-12-08 (×3): qty 1

## 2023-12-08 NOTE — Evaluation (Signed)
 Physical Therapy Evaluation Patient Details Name: Brandon Miller MRN: [id number] DOB: Sep 11, 1965 Today's Date: 12/08/2023  History of Present Illness  Pt is a 58 y.o. M who presents 12/06/2023 with worsening neck pain. Pt endorses sustaining a fall about 3 weeks ago in the bathroom and denies hitting his head. MRI of cervical spine shows C3-C4 severe DDD and central stenosis due to left paracentric disc osteophyte and C5-6 severe stenosis. Significant PMH: anxiety, depression, tobacco abuse.  Clinical Impression  PTA, pt independent with ADLs, typically walks with no AD, and drives as needed. He describes a fall in the shower 3 weeks ago due to slipping a towel, but denies hitting his head or neck at this time. Currently, pt is supervision for bed mobility, and CGA for STS and ambulation. Gross bilateral UE and LE strength deficits noted, as well as widespread 10/10 pain in neck, B arms (L>R), and B legs. Able to ambulate 100' CGA, and noted mild balance and gait deficits (see gait below) with no overt LOB or need for physical assist. Will recommend OP PT upon d/c to continue improving balance, gait, and strength deficits noted above. Acute PT to follow.       If plan is discharge home, recommend the following: A little help with walking and/or transfers   Can travel by private vehicle        Equipment Recommendations None recommended by PT  Recommendations for Other Services       Functional Status Assessment Patient has had a recent decline in their functional status and demonstrates the ability to make significant improvements in function in a reasonable and predictable amount of time.     Precautions / Restrictions Precautions Precautions: Fall Recall of Precautions/Restrictions: Intact Restrictions Weight Bearing Restrictions Per Provider Order: No      Mobility  Bed Mobility Overal bed mobility: Needs Assistance Bed Mobility: Supine to Sit     Supine to sit:  Supervision, HOB elevated     General bed mobility comments: Used momentum for trunk management for supine>sit.    Transfers Overall transfer level: Needs assistance Equipment used: None Transfers: Sit to/from Stand Sit to Stand: Contact guard assist           General transfer comment: Denies dizziness/lightheadedness. Slight initial sway with standing that improved with time.    Ambulation/Gait Ambulation/Gait assistance: Contact guard assist Gait Distance (Feet): 100 Feet Assistive device: None Gait Pattern/deviations: Step-through pattern, Decreased dorsiflexion - right, Decreased dorsiflexion - left Gait velocity: Dec Gait velocity interpretation: 1.31 - 2.62 ft/sec, indicative of limited community ambulator Pre-gait activities: Marches at [employer] performed. Steady and no LOB noted. General Gait Details: Pt with inc lateral sway bilaterally. Overall, gait appeared stiff and slightly unsteady, but no staggering or LOB noted.  Stairs            Wheelchair Mobility     Tilt Bed    Modified Rankin (Stroke Patients Only)       Balance Overall balance assessment: Needs assistance Sitting-balance support: Feet supported, No upper extremity supported Sitting balance-Leahy Scale: Fair     Standing balance support: No upper extremity supported, During functional activity Standing balance-Leahy Scale: Fair                               Pertinent Vitals/Pain Pain Assessment Pain Assessment: 0-10 Pain Score: 10-Worst pain ever Pain Location: Neck, arms, legs, everywhere; states he is having a gout flare  up currently. Noted L elbow slightly swollen and red Pain Descriptors / Indicators: Aching, Discomfort Pain Intervention(s): Limited activity within patient's tolerance, Monitored during session    Home Living Family/patient expects to be discharged to:: Private residence Living Arrangements: Other relatives;Children Available Help at Discharge:  Family;Available PRN/intermittently Type of Home: House Home Access: Level entry       Home Layout: One level Home Equipment: Agricultural Consultant (2 wheels);Cane - single point;Crutches      Prior Function Prior Level of Function : Independent/Modified Independent;Driving;History of Falls (last six months)             Mobility Comments: Fall 3 weeks ago d/t slipping on towerl in shower. Denies hitting head or neck at this time. Typically does not walk with AD. States pain and weakness in neck and arms namely began about 3 weeks ago. ADLs Comments: Independent     Extremity/Trunk Assessment   Upper Extremity Assessment Upper Extremity Assessment: RUE deficits/detail;LUE deficits/detail RUE Deficits / Details: MMT: elbow flexion and extension 3+/5; shoulder flexion 3+/5 in available range (limited to approximately 90 deg). Grip with moderate deficits bilaterally. LUE Deficits / Details: Shoulder flexion painful and limited to 90 deg. MMT: elbow flexion/extension 1/5. Moderate grip strength deficits bilaterally.    Lower Extremity Assessment Lower Extremity Assessment: RLE deficits/detail;LLE deficits/detail RLE Deficits / Details: MMT grossly 3+/5 for hip flexion, knee flexion/extension, ankle PF/DF LLE Deficits / Details: MMT grossly 3+/5 for hip flexion, knee flexion/extension, ankle PF/DF       Communication   Communication Communication: No apparent difficulties    Cognition Arousal: Alert Behavior During Therapy: WFL for tasks assessed/performed   PT - Cognitive impairments: No apparent impairments                         Following commands: Intact       Cueing Cueing Techniques: Verbal cues, Gestural cues     General Comments General comments (skin integrity, edema, etc.): Pt expresses desire not to have surgery on his neck, and he is afraid of becoming paralyzed.    Exercises     Assessment/Plan    PT Assessment Patient needs continued PT  services  PT Problem List Decreased strength;Decreased range of motion;Decreased activity tolerance;Decreased balance;Decreased mobility;Decreased safety awareness       PT Treatment Interventions DME instruction;Gait training;Stair training;Functional mobility training;Therapeutic activities;Therapeutic exercise;Balance training;Neuromuscular re-education;Cognitive remediation;Patient/family education;Manual techniques;Modalities    PT Goals (Current goals can be found in the Care Plan section)  Acute Rehab PT Goals Patient Stated Goal: To go home PT Goal Formulation: With patient Time For Goal Achievement: 12/22/23 Potential to Achieve Goals: Good    Frequency Min 2X/week     Co-evaluation               AM-PAC PT 6 Clicks Mobility  Outcome Measure Help needed turning from your back to your side while in a flat bed without using bedrails?: A Little Help needed moving from lying on your back to sitting on the side of a flat bed without using bedrails?: A Little Help needed moving to and from a bed to a chair (including a wheelchair)?: A Little Help needed standing up from a chair using your arms (e.g., wheelchair or bedside chair)?: A Little Help needed to walk in hospital room?: A Little Help needed climbing 3-5 steps with a railing? : A Little 6 Click Score: 18    End of Session Equipment Utilized During Treatment: Gait belt  Activity Tolerance: Patient tolerated treatment well Patient left: in chair;with call bell/phone within reach;with chair alarm set Nurse Communication: Mobility status PT Visit Diagnosis: Other abnormalities of gait and mobility (R26.89);Unsteadiness on feet (R26.81);History of falling (Z91.81)    Time: 9185-9153 PT Time Calculation (min) (ACUTE ONLY): 32 min   Charges:   PT Evaluation $PT Eval Low Complexity: 1 Low   PT General Charges $$ ACUTE PT VISIT: 1 Visit         Gennifer Potenza, SPT   Laquasha Groome 12/08/2023, 10:08 AM

## 2023-12-08 NOTE — Progress Notes (Signed)
 PROGRESS NOTE    Brandon Miller  FMW:[id number] DOB: [birth year] DOA: 12/06/2023 PCP: [facility]  Chief Complaint  Patient presents with   Neck Pain    Brief Narrative:   58 y.o. male with medical history significant of anxiety, depression, tobacco abuse who presents to the emergency department due to worsening neck pain, right arm weakness, and leg weakness. The patient sustained Kamari Bilek mechanical fall about 3 weeks prior to this admission.  He initially complained of shoulder pain, but eventually developed some neck pain with radiation to the arms.  He was seen in the ED and treated with Valium, Toradol  and lidocaine  patch.  He was discharged with Lidoderm , Valium and naproxen.  Patient has since followed up with orthopedic Provider where he received injections to right shoulder and neck without any improvement.  He came to the emergency department on 12/04/2023.  At that time, MRI of cervical spine shows C3-C4 severe DDD and central stenosis due to left paracentric disc osteophyte and C5-6 severe stenosis.  Neurosurgery (Dr. Darnella) consulted and recommended Medrol  Dosepak and pain medications and for outpatient follow-up on Monday 11/17.  He presented to the ER today due to worsening pain with weakness in her arms and legs and some trouble walking, but denies urinary/bowel incontinence.  Patient denies any new falls.    ED course In the emergency department, he was afebrile hemodynamically stable.  WBC 8.1, hemoglobin 16.0, was 159.  Sodium 137, potassium 3.9, bicarbonate 25, serum creatinine 0.92.  AST 97, ALT 94, alk phosphatase 92, total bilirubin 1.5.  Dilaudid  was given, IV hydration was provided. Neurosurgery (NP Meyran) was consulted who recommended patient be admitted to [facility] and that neurosurgery team will consult on him on arrival.  Assessment & Plan:   Principal Problem:   Neck pain Active Problems:   Cervical spinal cord compression  (HCC)  Cervical myelopathy/cervical cord compression -severe spinal canal stenosis and spinal cord compression at C5-6 due to bulding disc osteophyte complex and caudal central disc extrusion with mildly increased T2 signal in the cord.  Severe L sided spinal canal stenosis and severe L neural foraminal stenosis at C3-4 due to Rocklyn Mayberry left posterolateral disc osteophyte complex resulting in L spinal cord compression and likely left 4 nerve compression.  Moderate R sided spinal canal and neural foraminal stenosis at this level.  (See MRI report) -Pain management, bowel regimen -neurosurgery c/s, appreciate recs   Left Elbow Swelling Based on his report, suspicious for gout flare Colchicine   Uric acid level  Depression with anxiety Continue Wellbutrin , Valium, Remeron    Tobacco abuse Patient was counseled on tobacco abuse cessation Continue nicotine  patch   Alcohol dependence - Patient drinks 4-5 beers daily - Alcohol withdrawal protocol    DVT prophylaxis: heparin Code Status: full Family Communication: none Disposition:   Status is: Inpatient Remains inpatient appropriate because: need for continued nsgy consult   Consultants:  neurosurgery  Procedures:  none  Antimicrobials:  Anti-infectives (From admission, onward)    None       Subjective: C/o elbow pain Notes typical of gout flare  Objective: Vitals:   12/07/23 2111 12/07/23 2345 12/08/23 0455 12/08/23 0801  BP: 115/70 135/70 (!) 109/55 116/69  Pulse: (!) 51 (!) 58 (!) 57 67  Resp: 18 18 16 16   Temp: 98.4 F (36.9 C) 98.4 F (36.9 C) 98 F (36.7 C) 97.9 F (36.6 C)  TempSrc: Oral Oral  Oral  SpO2: 99% 98% 99% 98%  Weight:  Height:        Intake/Output Summary (Last 24 hours) at 12/08/2023 1000 Last data filed at 12/08/2023 0445 Gross per 24 hour  Intake 350 ml  Output 1700 ml  Net -1350 ml   Filed Weights   12/06/23 1859  Weight: 104.3 kg    Examination:  General exam: Appears calm  and comfortable  Respiratory system: unlabored Cardiovascular system: RRR Gastrointestinal system: Abdomen is nondistended, soft and nontender Central nervous system: Alert and oriented. Generalized weakness, 4/5 strength (LUE weaker due to elbow pain) Extremities: L elbow with erythema and TTP - limited ROM   Data Reviewed: I have personally reviewed following labs and imaging studies  CBC: Recent Labs  Lab 12/04/23 0743 12/06/23 2052 12/07/23 0526  WBC  --  8.1 6.4  NEUTROABS  --  5.7  --   HGB 17.0 16.0 14.6  HCT 50.0 44.4 41.3  MCV  --  90.4 92.4  PLT  --  111* 98*    Basic Metabolic Panel: Recent Labs  Lab 12/04/23 0743 12/06/23 2052 12/07/23 0526  NA 139 135 137  K 4.0 3.8 3.9  CL 108 99 102  CO2  --  24 25  GLUCOSE 123* 102* 97  BUN 25* 19 19  CREATININE 1.30* 0.90 0.92  CALCIUM   --  8.8* 8.4*  MG  --   --  2.6*  PHOS  --   --  3.5    GFR: Estimated Creatinine Clearance: 115.8 mL/min (by C-G formula based on SCr of 0.92 mg/dL).  Liver Function Tests: Recent Labs  Lab 12/07/23 0526  AST 97*  ALT 94*  ALKPHOS 92  BILITOT 1.5*  PROT 6.2*  ALBUMIN 4.0    CBG: No results for input(s): GLUCAP in the last 168 hours.   No results found for this or any previous visit (from the past 240 hours).       Radiology Studies: No results found.      Scheduled Meds:  buPROPion   150 mg Oral q morning   colchicine   1.2 mg Oral STAT   Followed by   colchicine   0.6 mg Oral Daily   feeding supplement  237 mL Oral BID BM   folic acid   1 mg Oral Daily   heparin  5,000 Units Subcutaneous Q8H   methocarbamol   500 mg Oral BID   mirtazapine   45 mg Oral QHS   multivitamin with minerals  1 tablet Oral Daily   nicotine   7 mg Transdermal Daily   thiamine   100 mg Oral Daily   Or   thiamine   100 mg Intravenous Daily   Continuous Infusions:   LOS: 2 days    Time spent: over 30 min    Meliton Monte, MD [facility]   To contact the  attending provider between 7A-7P or the covering provider during after hours 7P-7A, please log into the web site [email or web address] and access using universal [place] password for that web site. If you do not have the password, please call the hospital operator.  12/08/2023, 10:00 AM

## 2023-12-08 NOTE — Evaluation (Signed)
 Occupational Therapy Evaluation Patient Details Name: Brandon Miller MRN: [id number] DOB: 01-15-1966 Today's Date: 12/08/2023   History of Present Illness   Pt is a 58 y.o. M who presents 12/06/2023 with worsening neck pain. Pt endorses sustaining a fall about 3 weeks ago in the bathroom and denies hitting his head. MRI of cervical spine shows C3-C4 severe DDD and central stenosis due to left paracentric disc osteophyte and C5-6 severe stenosis. Significant PMH: anxiety, depression, tobacco abuse.     Clinical Impressions Pt ind at baseline with ADL/functional mobility, lives with family who cans assist PRN at d/c. Pt currently with neck pain, overall needs up to minA for ADLs, supervision for bed mobility and CGA for transfers without AD. Pt with mild unsteadiness due to R foot gout pain. Educated on cervical prec for comfort. Pt presenting with impairments listed below, will follow acutely. Anticipate no OT follow up needs at d/c.      If plan is discharge home, recommend the following:   A little help with walking and/or transfers;A little help with bathing/dressing/bathroom;Assist for transportation;Assistance with cooking/housework     Functional Status Assessment   Patient has had a recent decline in their functional status and demonstrates the ability to make significant improvements in function in a reasonable and predictable amount of time.     Equipment Recommendations   None recommended by OT     Recommendations for Other Services   PT consult     Precautions/Restrictions   Precautions Precautions: Fall Recall of Precautions/Restrictions: Intact Restrictions Weight Bearing Restrictions Per Provider Order: No     Mobility Bed Mobility Overal bed mobility: Needs Assistance Bed Mobility: Sidelying to Sit, Supine to Sit   Sidelying to sit: Supervision Supine to sit: Supervision          Transfers Overall transfer level: Needs  assistance Equipment used: None Transfers: Sit to/from Stand Sit to Stand: Contact guard assist           General transfer comment: mild instability, reports gout pain in feet also      Balance Overall balance assessment: Needs assistance Sitting-balance support: Feet supported, No upper extremity supported Sitting balance-Leahy Scale: Good     Standing balance support: No upper extremity supported, During functional activity Standing balance-Leahy Scale: Fair                             ADL either performed or assessed with clinical judgement   ADL Overall ADL's : Needs assistance/impaired Eating/Feeding: Set up   Grooming: Set up   Upper Body Bathing: Set up   Lower Body Bathing: Minimal assistance   Upper Body Dressing : Contact guard assist   Lower Body Dressing: Minimal assistance   Toilet Transfer: Contact guard assist;Ambulation;Regular Toilet   Toileting- Clothing Manipulation and Hygiene: Contact guard assist       Functional mobility during ADLs: Contact guard assist       Vision   Vision Assessment?: No apparent visual deficits     Perception Perception: Not tested       Praxis Praxis: Not tested       Pertinent Vitals/Pain Pain Assessment Pain Assessment: Faces Pain Score: 6  Faces Pain Scale: Hurts even more Pain Location: L elbow Pain Descriptors / Indicators: Aching, Discomfort Pain Intervention(s): Limited activity within patient's tolerance, Monitored during session, Repositioned     Extremity/Trunk Assessment Upper Extremity Assessment Upper Extremity Assessment: Generalized weakness;Right hand dominant;LUE deficits/detail LUE  Deficits / Details: L elbow ROM limited 2/2 gout pain, overall 3+/5 globally   Lower Extremity Assessment Lower Extremity Assessment: Defer to PT evaluation   Cervical / Trunk Assessment Cervical / Trunk Assessment: Other exceptions (neck pain)   Communication  Communication Communication: No apparent difficulties   Cognition Arousal: Alert Behavior During Therapy: WFL for tasks assessed/performed Cognition: No apparent impairments                               Following commands: Intact       Cueing  General Comments   Cueing Techniques: Verbal cues;Gestural cues  VSS; reviewed cervical prec for comfort   Exercises     Shoulder Instructions      Home Living Family/patient expects to be discharged to:: Private residence Living Arrangements: Other relatives;Children (sister) Available Help at Discharge: Family;Available PRN/intermittently Type of Home: House Home Access: Level entry     Home Layout: One level     Bathroom Shower/Tub: Chief Strategy Officer: Standard Bathroom Accessibility: Yes   Home Equipment: Agricultural Consultant (2 wheels);Cane - single point;Crutches          Prior Functioning/Environment Prior Level of Function : Independent/Modified Independent             Mobility Comments: Fall 3 weeks ago d/t slipping on towerl in shower. Denies hitting head or neck at this time. Typically does not walk with AD. States pain and weakness in neck and arms namely began about 3 weeks ago. ADLs Comments: Independent    OT Problem List: Decreased strength;Decreased range of motion;Decreased activity tolerance;Impaired balance (sitting and/or standing);Decreased safety awareness   OT Treatment/Interventions: Self-care/ADL training;Therapeutic exercise;Energy conservation;DME and/or AE instruction;Therapeutic activities;Patient/family education;Balance training      OT Goals(Current goals can be found in the care plan section)   Acute Rehab OT Goals Patient Stated Goal: did not state OT Goal Formulation: With patient Time For Goal Achievement: 12/22/23 Potential to Achieve Goals: Good ADL Goals Pt Will Perform Lower Body Dressing: Independently;sitting/lateral leans;sit to/from  stand Pt Will Transfer to Toilet: Independently;ambulating;regular height toilet Pt Will Perform Tub/Shower Transfer: Tub transfer;Shower transfer;ambulating   OT Frequency:  Min 2X/week    Co-evaluation              AM-PAC OT 6 Clicks Daily Activity     Outcome Measure Help from another person eating meals?: None Help from another person taking care of personal grooming?: A Little Help from another person toileting, which includes using toliet, bedpan, or urinal?: A Little Help from another person bathing (including washing, rinsing, drying)?: A Little Help from another person to put on and taking off regular upper body clothing?: A Little Help from another person to put on and taking off regular lower body clothing?: A Little 6 Click Score: 19   End of Session Nurse Communication: Mobility status  Activity Tolerance: Patient tolerated treatment well Patient left: in bed;with call bell/phone within reach;with bed alarm set  OT Visit Diagnosis: Unsteadiness on feet (R26.81);Other abnormalities of gait and mobility (R26.89);Muscle weakness (generalized) (M62.81);History of falling (Z91.81)                Time: 8898-8885 OT Time Calculation (min): 13 min Charges:  OT General Charges $OT Visit: 1 Visit OT Evaluation $OT Eval Low Complexity: 1 Low  Josiah Wojtaszek K, OTD, OTR/L [employer] Preferred Acute Rehab [phone]   Jolyne Laye K Koonce 12/08/2023, 12:10 PM

## 2023-12-08 NOTE — Consult Note (Signed)
 Reason for Consult: Cervical stenosis Referring Physician: Hospitalist  Brandon Miller is an 59 y.o. male.   HPI:  58 year old gentleman who has cervical spinal stenosis at C3-4 and C5-6 who was transferred from an outside hospital for further evaluation and management.  He was seen as an outpatient and at [facility] a couple of times with neck pain with shoulder pain and some arm pain with numbness in the arms at times.  He was released and told to follow-up should he have more numbness or difficulty with his legs.  He started having some aching in his legs and so we went back to the emergency department and was admitted.  Today he complains of moderate neck pain with shoulder pain and when he flexes his neck he gets pain into the shoulder and down the chest somewhat.  No electrical shocklike sensations.  No obvious change in gait.  Maybe some difficulty with fine motor movements.  He does describe progressive weakness but in a vague way.  No focal weakness or change in gait or obvious examples of weakness.  It is more of an overall feeling.  He was to follow-up with Dr. Darnella as an outpatient on Monday.  Past Medical History:  Diagnosis Date   Allergy    Anemia    Anxiety    Arthritis    RA   Complication of anesthesia    hard to wake up-was told he may have sleep apnea-never tested   Depression    Diabetes mellitus without complication (HCC)    with weight no more problems   Seizure (HCC)    had one seizure of unknown etiology a few tears ago, none since and on no meds for them   Sleep apnea    Snores     Past Surgical History:  Procedure Laterality Date   BIOPSY  02/12/2019   Procedure: BIOPSY;  Surgeon: Shaaron Lamar HERO, MD;  [facility];  Service: Endoscopy;;  stomach   COLONOSCOPY WITH PROPOFOL  N/A 02/12/2019   Procedure: COLONOSCOPY WITH PROPOFOL ;  Surgeon: Shaaron Lamar HERO, MD;  [facility];  Service: Endoscopy;  Laterality: N/A;  10:30am   ELBOW  SURGERY Right 2010   Dr. Beverley   ESOPHAGOGASTRODUODENOSCOPY (EGD) WITH PROPOFOL  N/A 02/12/2019   Procedure: ESOPHAGOGASTRODUODENOSCOPY (EGD) WITH PROPOFOL ;  Surgeon: Shaaron Lamar HERO, MD;  [facility];  Service: Endoscopy;  Laterality: N/A;   HERNIA REPAIR     Right inguinal   KNEE ARTHROSCOPY WITH MEDIAL MENISECTOMY Right 06/11/2013   Procedure: RIGHT KNEE ARTHROSCOPY WITH MEDIAL MENISECTOMY AND CHONDROPLASTY;  Surgeon: Toribio JULIANNA Beverley, MD;  Location: [facility];  Service: Orthopedics;  Laterality: Right;  Chondroplasty, loose bodies, medial menisectomy, lysis of adhesions.   KNEE SURGERY Right 2011   POLYPECTOMY  02/12/2019   Procedure: POLYPECTOMY;  Surgeon: Shaaron Lamar HERO, MD;  [facility];  Service: Endoscopy;;   right ankle     SHOULDER SURGERY Right 2010   Dr. Beverley   TOTAL KNEE ARTHROPLASTY Right 08/25/2014   Procedure: TOTAL KNEE ARTHROPLASTY;  Surgeon: Toribio JULIANNA Beverley, MD;  Location: [facility] OR;  Service: Orthopedics;  Laterality: Right;    Allergies  Allergen Reactions   Tizanidine Hcl Anaphylaxis and Swelling   Allopurinol  Other (See Comments)    Elevated liver enzymes   Indomethacin Nausea And Vomiting, Nausea Only and Swelling   Daypro [Oxaprozin] Other (See Comments)    Tears his stomach up   Diclofenac Sodium  Other (See Comments)    Dizzy    Prednisone  Other (See Comments)    Gets very mean    Social History   Tobacco Use   Smoking status: Every Day    Current packs/day: 1.50    Average packs/day: 1.5 packs/day for 25.0 years (37.5 ttl pk-yrs)    Types: Cigarettes   Smokeless tobacco: Never  Substance Use Topics   Alcohol use: Yes    Comment: beer daily    Family History  Problem Relation Age of Onset   Hepatitis Mother    Cirrhosis Mother        Did not drink   Pneumonia Father    Kidney failure Father    Gastric cancer Brother        ? Gastric CA spread to esophagus   Colon cancer Neg Hx      Review of  Systems  Positive ROS: As above  All other systems have been reviewed and were otherwise negative with the exception of those mentioned in the HPI and as above.  Objective: Vital signs in last 24 hours: Temp:  [97.9 F (36.6 C)-98.4 F (36.9 C)] 97.9 F (36.6 C) (11/16 0801) Pulse Rate:  [45-67] 67 (11/16 0801) Resp:  [16-20] 16 (11/16 0801) BP: (109-145)/(55-86) 116/69 (11/16 0801) SpO2:  [95 %-99 %] 98 % (11/16 0801)  General Appearance: Alert, cooperative, no distress, appears stated age Head: Normocephalic, without obvious abnormality, atraumatic Eyes: PERRL, conjunctiva/corneas clear, EOM's intact     Throat: benign Neck: Supple, symmetrical, trachea midline Lungs: respirations unlabored Heart: Regular rate and rhythm Abdomen: Soft, non-tender Extremities: Extremities normal, atraumatic, no cyanosis or edema Pulses: 2+ and symmetric all extremities Skin: Skin color, texture, turgor normal, no rashes or lesions  NEUROLOGIC:   Mental status: A&O x4, no aphasia, good attention span, Memory and fund of knowledge appear to be appropriate Motor Exam - grossly normal, normal tone and bulk as best I can tell to a seated exam Sensory Exam - grossly normal Reflexes: No Hoffman sign Coordination - grossly normal Gait -not tested Balance -not tested Cranial Nerves: I: smell Not tested  II: visual acuity  OS: na  OD: na  II: visual fields Full to confrontation  II: pupils Equal, round, reactive to light  III,VII: ptosis None  III,IV,VI: extraocular muscles  Full ROM  V: mastication Normal  V: facial light touch sensation  Normal  V,VII: corneal reflex  Present  VII: facial muscle function - upper  Normal  VII: facial muscle function - lower Normal  VIII: hearing Not tested  IX: soft palate elevation  Normal  IX,X: gag reflex Present  XI: trapezius strength  5/5  XI: sternocleidomastoid strength 5/5  XI: neck flexion strength  5/5  XII: tongue strength  Normal     Data Review Lab Results  Component Value Date   WBC 6.4 12/07/2023   HGB 14.6 12/07/2023   HCT 41.3 12/07/2023   MCV 92.4 12/07/2023   PLT 98 (L) 12/07/2023   Lab Results  Component Value Date   NA 137 12/07/2023   K 3.9 12/07/2023   CL 102 12/07/2023   CO2 25 12/07/2023   BUN 19 12/07/2023   CREATININE 0.92 12/07/2023   GLUCOSE 97 12/07/2023   Lab Results  Component Value Date   INR 0.9 10/10/2020    Radiology: No results found.  I reviewed his cervical spine MRI and he has moderate to moderately severe stenosis at C3-4 with more severe stenosis C5-6 with  cervical disc herniation and spondylosis.   Assessment/Plan: Estimated body mass index is 28.74 kg/m as calculated from the following:   Height as of this encounter: 6' 3 (1.905 m).   Weight as of this encounter: 104.3 kg.   Cervical spinal stenosis with cervical spondylitic myeloradiculopathy, C3-4 and C5-6.  He is likely going to need decompressive surgery in the near future.  We will let Dr. Darnella know of the patient's presence here in the hospital and he can plan further care with the patient as he sees fit.   Alm GORMAN Molt 12/08/2023 9:06 AM

## 2023-12-09 ENCOUNTER — Inpatient Hospital Stay (HOSPITAL_COMMUNITY)

## 2023-12-09 ENCOUNTER — Inpatient Hospital Stay (HOSPITAL_COMMUNITY): Admitting: Certified Registered Nurse Anesthetist

## 2023-12-09 ENCOUNTER — Inpatient Hospital Stay (HOSPITAL_COMMUNITY): Admission: EM | Disposition: A | Payer: Self-pay | Source: Home / Self Care | Attending: Family Medicine

## 2023-12-09 ENCOUNTER — Encounter (HOSPITAL_COMMUNITY): Payer: Self-pay | Admitting: Internal Medicine

## 2023-12-09 DIAGNOSIS — M542 Cervicalgia: Secondary | ICD-10-CM | POA: Diagnosis not present

## 2023-12-09 HISTORY — PX: CERVICAL DISC ARTHROPLASTY: SHX587

## 2023-12-09 HISTORY — PX: ANTERIOR CERVICAL DECOMP/DISCECTOMY FUSION: SHX1161

## 2023-12-09 LAB — CBC WITH DIFFERENTIAL/PLATELET
Abs Immature Granulocytes: 0.06 K/uL (ref 0.00–0.07)
Basophils Absolute: 0 K/uL (ref 0.0–0.1)
Basophils Relative: 0 %
Eosinophils Absolute: 0 K/uL (ref 0.0–0.5)
Eosinophils Relative: 0 %
HCT: 41.9 % (ref 39.0–52.0)
Hemoglobin: 14.8 g/dL (ref 13.0–17.0)
Immature Granulocytes: 1 %
Lymphocytes Relative: 17 %
Lymphs Abs: 1.5 K/uL (ref 0.7–4.0)
MCH: 32.1 pg (ref 26.0–34.0)
MCHC: 35.3 g/dL (ref 30.0–36.0)
MCV: 90.9 fL (ref 80.0–100.0)
Monocytes Absolute: 1.1 K/uL — ABNORMAL HIGH (ref 0.1–1.0)
Monocytes Relative: 12 %
Neutro Abs: 6.6 K/uL (ref 1.7–7.7)
Neutrophils Relative %: 70 %
Platelets: 90 K/uL — ABNORMAL LOW (ref 150–400)
RBC: 4.61 MIL/uL (ref 4.22–5.81)
RDW: 13.1 % (ref 11.5–15.5)
WBC: 9.2 K/uL (ref 4.0–10.5)
nRBC: 0 % (ref 0.0–0.2)

## 2023-12-09 LAB — COMPREHENSIVE METABOLIC PANEL WITH GFR
ALT: 72 U/L — ABNORMAL HIGH (ref 0–44)
AST: 73 U/L — ABNORMAL HIGH (ref 15–41)
Albumin: 3.2 g/dL — ABNORMAL LOW (ref 3.5–5.0)
Alkaline Phosphatase: 60 U/L (ref 38–126)
Anion gap: 9 (ref 5–15)
BUN: 13 mg/dL (ref 6–20)
CO2: 26 mmol/L (ref 22–32)
Calcium: 8.7 mg/dL — ABNORMAL LOW (ref 8.9–10.3)
Chloride: 98 mmol/L (ref 98–111)
Creatinine, Ser: 1.06 mg/dL (ref 0.61–1.24)
GFR, Estimated: >60 mL/min
Glucose, Bld: 95 mg/dL (ref 70–99)
Potassium: 3.7 mmol/L (ref 3.5–5.1)
Sodium: 133 mmol/L — ABNORMAL LOW (ref 135–145)
Total Bilirubin: 2.3 mg/dL — ABNORMAL HIGH (ref 0.0–1.2)
Total Protein: 6.7 g/dL (ref 6.5–8.1)

## 2023-12-09 LAB — SURGICAL PCR SCREEN
MRSA, PCR: NEGATIVE
Staphylococcus aureus: NEGATIVE

## 2023-12-09 LAB — PREPARE RBC (CROSSMATCH)

## 2023-12-09 LAB — GLUCOSE, CAPILLARY
Glucose-Capillary: 92 mg/dL (ref 70–99)
Glucose-Capillary: 122 mg/dL — ABNORMAL HIGH (ref 70–99)

## 2023-12-09 LAB — MAGNESIUM: Magnesium: 2.5 mg/dL — ABNORMAL HIGH (ref 1.7–2.4)

## 2023-12-09 LAB — PHOSPHORUS: Phosphorus: 3.3 mg/dL (ref 2.5–4.6)

## 2023-12-09 MED ORDER — HYDROMORPHONE HCL 1 MG/ML IJ SOLN
0.5000 mg | INTRAMUSCULAR | Status: DC | PRN
  Administered 2023-12-09 (×2): 0.5 mg via INTRAVENOUS
  Filled 2023-12-09 (×2): qty 0.5

## 2023-12-09 MED ORDER — SODIUM CHLORIDE 0.9 % IV SOLN
0.1500 ug/kg/min | INTRAVENOUS | Status: DC
  Filled 2023-12-09: qty 2000

## 2023-12-09 MED ORDER — LIDOCAINE-EPINEPHRINE 1 %-1:100000 IJ SOLN
INTRAMUSCULAR | Status: DC | PRN
  Administered 2023-12-09: 10 mL

## 2023-12-09 MED ORDER — KETAMINE HCL 50 MG/5ML IJ SOSY
PREFILLED_SYRINGE | INTRAMUSCULAR | Status: DC | PRN
  Administered 2023-12-09: 50 mg via INTRAVENOUS

## 2023-12-09 MED ORDER — PROPOFOL 10 MG/ML IV BOLUS
INTRAVENOUS | Status: DC | PRN
Start: 2023-12-09 — End: 2023-12-09
  Administered 2023-12-09: 100 mg via INTRAVENOUS

## 2023-12-09 MED ORDER — REMIFENTANIL HCL 2 MG IV SOLR
INTRAVENOUS | Status: AC
  Filled 2023-12-09: qty 2000

## 2023-12-09 MED ORDER — SODIUM CHLORIDE 0.9 % IV SOLN: INTRAVENOUS | Status: DC | PRN

## 2023-12-09 MED ORDER — MIDAZOLAM HCL 2 MG/2ML IJ SOLN
INTRAMUSCULAR | Status: AC
  Filled 2023-12-09: qty 2

## 2023-12-09 MED ORDER — FENTANYL CITRATE (PF) 250 MCG/5ML IJ SOLN
INTRAMUSCULAR | Status: DC | PRN
  Administered 2023-12-09: 50 ug via INTRAVENOUS
  Administered 2023-12-09: 75 ug via INTRAVENOUS

## 2023-12-09 MED ORDER — ONDANSETRON HCL 4 MG/2ML IJ SOLN
INTRAMUSCULAR | Status: DC | PRN
  Administered 2023-12-09: 4 mg via INTRAVENOUS

## 2023-12-09 MED ORDER — LIDOCAINE 2% (20 MG/ML) 5 ML SYRINGE
INTRAMUSCULAR | Status: AC
  Filled 2023-12-09: qty 5

## 2023-12-09 MED ORDER — EPHEDRINE SULFATE-NACL 50-0.9 MG/10ML-% IV SOSY
PREFILLED_SYRINGE | INTRAVENOUS | Status: DC | PRN
Start: 2023-12-09 — End: 2023-12-09
  Administered 2023-12-09: 7.5 mg via INTRAVENOUS
  Administered 2023-12-09: 5 mg via INTRAVENOUS

## 2023-12-09 MED ORDER — LIDOCAINE 2% (20 MG/ML) 5 ML SYRINGE
INTRAMUSCULAR | Status: DC | PRN
  Administered 2023-12-09: 80 mg via INTRAVENOUS

## 2023-12-09 MED ORDER — ROCURONIUM BROMIDE 10 MG/ML (PF) SYRINGE
PREFILLED_SYRINGE | INTRAVENOUS | Status: AC
  Filled 2023-12-09: qty 10

## 2023-12-09 MED ORDER — OXYCODONE HCL 5 MG PO TABS
5.0000 mg | ORAL_TABLET | Freq: Once | ORAL | Status: AC | PRN
  Administered 2023-12-09: 5 mg via ORAL

## 2023-12-09 MED ORDER — FENTANYL CITRATE (PF) 100 MCG/2ML IJ SOLN
INTRAMUSCULAR | Status: AC
  Filled 2023-12-09: qty 2

## 2023-12-09 MED ORDER — LACTATED RINGERS IV SOLN: INTRAVENOUS | Status: DC

## 2023-12-09 MED ORDER — CYCLOBENZAPRINE HCL 10 MG PO TABS
10.0000 mg | ORAL_TABLET | Freq: Three times a day (TID) | ORAL | Status: DC | PRN
Start: 2023-12-09 — End: 2023-12-10
  Administered 2023-12-09 – 2023-12-10 (×2): 10 mg via ORAL
  Filled 2023-12-09 (×2): qty 1

## 2023-12-09 MED ORDER — LACTATED RINGERS IV SOLN: INTRAVENOUS | Status: DC | PRN

## 2023-12-09 MED ORDER — PROPOFOL 500 MG/50ML IV EMUL
INTRAVENOUS | Status: DC | PRN
  Administered 2023-12-09: 75 ug/kg/min via INTRAVENOUS

## 2023-12-09 MED ORDER — OXYCODONE HCL 5 MG PO TABS
ORAL_TABLET | ORAL | Status: AC
  Filled 2023-12-09: qty 1

## 2023-12-09 MED ORDER — ACETAMINOPHEN 10 MG/ML IV SOLN: 1000.0000 mg | Freq: Once | INTRAVENOUS | Status: DC | PRN

## 2023-12-09 MED ORDER — THROMBIN 5000 UNITS EX SOLR: OROMUCOSAL | Status: DC | PRN

## 2023-12-09 MED ORDER — SODIUM CHLORIDE 0.9 % IV SOLN
INTRAVENOUS | Status: DC | PRN
  Administered 2023-12-09: .2 ug/kg/min via INTRAVENOUS
  Administered 2023-12-09: .15 ug/kg/min via INTRAVENOUS

## 2023-12-09 MED ORDER — CHLORHEXIDINE GLUCONATE 0.12 % MT SOLN
OROMUCOSAL | Status: AC
  Filled 2023-12-09: qty 15

## 2023-12-09 MED ORDER — GLYCOPYRROLATE PF 0.2 MG/ML IJ SOSY
PREFILLED_SYRINGE | INTRAMUSCULAR | Status: AC
  Filled 2023-12-09: qty 1

## 2023-12-09 MED ORDER — THROMBIN 5000 UNITS EX KIT
PACK | CUTANEOUS | Status: AC
  Filled 2023-12-09: qty 1

## 2023-12-09 MED ORDER — MIDAZOLAM HCL (PF) 2 MG/2ML IJ SOLN
INTRAMUSCULAR | Status: DC | PRN
  Administered 2023-12-09: 2 mg via INTRAVENOUS

## 2023-12-09 MED ORDER — GLYCOPYRROLATE 0.2 MG/ML IJ SOLN
INTRAMUSCULAR | Status: DC | PRN
  Administered 2023-12-09: .2 mg via INTRAVENOUS

## 2023-12-09 MED ORDER — KETOROLAC TROMETHAMINE 30 MG/ML IJ SOLN
INTRAMUSCULAR | Status: AC
  Filled 2023-12-09: qty 1

## 2023-12-09 MED ORDER — PHENYLEPHRINE HCL-NACL 20-0.9 MG/250ML-% IV SOLN
INTRAVENOUS | Status: DC | PRN
  Administered 2023-12-09: 50 ug/min via INTRAVENOUS

## 2023-12-09 MED ORDER — FENTANYL CITRATE (PF) 100 MCG/2ML IJ SOLN
25.0000 ug | INTRAMUSCULAR | Status: DC | PRN
  Administered 2023-12-09: 25 ug via INTRAVENOUS

## 2023-12-09 MED ORDER — ACETAMINOPHEN 10 MG/ML IV SOLN
INTRAVENOUS | Status: AC
Start: 2023-12-09 — End: 2023-12-09
  Filled 2023-12-09: qty 100

## 2023-12-09 MED ORDER — DEXAMETHASONE SOD PHOSPHATE PF 10 MG/ML IJ SOLN
INTRAMUSCULAR | Status: DC | PRN
Start: 2023-12-09 — End: 2023-12-09
  Administered 2023-12-09: 8 mg via INTRAVENOUS

## 2023-12-09 MED ORDER — CHLORHEXIDINE GLUCONATE 0.12 % MT SOLN
15.0000 mL | Freq: Once | OROMUCOSAL | Status: AC
  Filled 2023-12-09: qty 15

## 2023-12-09 MED ORDER — SODIUM CHLORIDE 0.9% IV SOLUTION: Freq: Once | INTRAVENOUS | Status: AC

## 2023-12-09 MED ORDER — KETAMINE HCL 50 MG/5ML IJ SOSY
PREFILLED_SYRINGE | INTRAMUSCULAR | Status: AC
  Filled 2023-12-09: qty 5

## 2023-12-09 MED ORDER — FENTANYL CITRATE (PF) 250 MCG/5ML IJ SOLN
INTRAMUSCULAR | Status: AC
  Filled 2023-12-09: qty 5

## 2023-12-09 MED ORDER — CEFAZOLIN SODIUM-DEXTROSE 2-4 GM/100ML-% IV SOLN
2.0000 g | INTRAVENOUS | Status: AC
  Administered 2023-12-09: 2 g via INTRAVENOUS
  Filled 2023-12-09: qty 100

## 2023-12-09 MED ORDER — ORAL CARE MOUTH RINSE
15.0000 mL | Freq: Once | OROMUCOSAL | Status: AC
  Administered 2023-12-09: 15 mL via OROMUCOSAL

## 2023-12-09 MED ORDER — ONDANSETRON HCL 4 MG/2ML IJ SOLN
INTRAMUSCULAR | Status: AC
  Filled 2023-12-09: qty 2

## 2023-12-09 MED ORDER — PREDNISONE 20 MG PO TABS
40.0000 mg | ORAL_TABLET | Freq: Every day | ORAL | Status: DC
  Administered 2023-12-09 – 2023-12-10 (×2): 40 mg via ORAL
  Filled 2023-12-09 (×2): qty 2

## 2023-12-09 MED ORDER — DROPERIDOL 2.5 MG/ML IJ SOLN: 0.6250 mg | Freq: Once | INTRAMUSCULAR | Status: DC | PRN

## 2023-12-09 MED ORDER — 0.9 % SODIUM CHLORIDE (POUR BTL) OPTIME
TOPICAL | Status: DC | PRN
  Administered 2023-12-09: 1000 mL

## 2023-12-09 MED ORDER — ACETAMINOPHEN 10 MG/ML IV SOLN
INTRAVENOUS | Status: DC | PRN
  Administered 2023-12-09: 1000 mg via INTRAVENOUS

## 2023-12-09 MED ORDER — LIDOCAINE-EPINEPHRINE 1 %-1:100000 IJ SOLN
INTRAMUSCULAR | Status: AC
  Filled 2023-12-09: qty 1

## 2023-12-09 MED ORDER — SUCCINYLCHOLINE CHLORIDE 200 MG/10ML IV SOSY
PREFILLED_SYRINGE | INTRAVENOUS | Status: DC | PRN
  Administered 2023-12-09: 100 mg via INTRAVENOUS

## 2023-12-09 MED ORDER — OXYCODONE HCL 5 MG/5ML PO SOLN: 5.0000 mg | Freq: Once | ORAL | Status: AC | PRN

## 2023-12-09 SURGICAL SUPPLY — 60 items
BAG COUNTER SPONGE SURGICOUNT (BAG) ×1 IMPLANT
BAND RUBBER #18 3X1/16 STRL (MISCELLANEOUS) ×2 IMPLANT
BASKET BONE COLLECTION (BASKET) ×1 IMPLANT
BIT DRILL NEURO 2X3.1 SFT TUCH (MISCELLANEOUS) IMPLANT
BLADE CLIPPER SPEC (BLADE) IMPLANT
BLADE CLIPPER SURG (BLADE) IMPLANT
BUR MATCHSTICK NEURO 3.0 LAGG (BURR) ×1 IMPLANT
CAGE CERV MOD 6X17X14 7D (Cage) IMPLANT
CANISTER SUCTION 3000ML PPV (SUCTIONS) ×1 IMPLANT
DERMABOND ADVANCED .7 DNX12 (GAUZE/BANDAGES/DRESSINGS) ×1 IMPLANT
DISC SIMPLIFY 2 HT4 (Neuro Prosthesis/Implant) IMPLANT
DRAIN 1/8 RD END PERF LFSIL ST (DRAIN) IMPLANT
DRAPE C-ARM 42X72 X-RAY (DRAPES) ×2 IMPLANT
DRAPE HALF SHEET 40X57 (DRAPES) IMPLANT
DRAPE LAPAROTOMY 100X72 PEDS (DRAPES) ×1 IMPLANT
DRAPE MICROSCOPE LEICA (MISCELLANEOUS) ×1 IMPLANT
DRAPE UTILITY 15X26 TOWEL STRL (DRAPES) ×1 IMPLANT
DRAPE UTILITY XL STRL (DRAPES) ×1 IMPLANT
DURAPREP 26ML APPLICATOR (WOUND CARE) ×2 IMPLANT
ELECT NVM5 SURFACE MEP/EMG (ELECTRODE) IMPLANT
ELECTRODE BLADE INSULATED 4IN (ELECTROSURGICAL) ×1 IMPLANT
ELECTRODE REM PT RTRN 9FT ADLT (ELECTROSURGICAL) ×1 IMPLANT
EVACUATOR SILICONE 100CC (DRAIN) IMPLANT
FEE NCS SETUP TEAR DOWN NVM5 (MISCELLANEOUS) IMPLANT
FIBER BONE ALLOSYNC EXPAND 1 (Bone Implant) IMPLANT
GAUZE 4X4 16PLY ~~LOC~~+RFID DBL (SPONGE) IMPLANT
GLOVE INDICATOR 8.5 STRL (GLOVE) ×1 IMPLANT
GLOVE SURG SYN 8.0 PF PI (GLOVE) ×1 IMPLANT
GOWN STRL REUS W/ TWL LRG LVL3 (GOWN DISPOSABLE) ×2 IMPLANT
GOWN STRL REUS W/ TWL XL LVL3 (GOWN DISPOSABLE) ×1 IMPLANT
GOWN STRL REUS W/TWL 2XL LVL3 (GOWN DISPOSABLE) IMPLANT
HEMOSTAT POWDER KIT SURGIFOAM (HEMOSTASIS) ×1 IMPLANT
KIT BASIN OR (CUSTOM PROCEDURE TRAY) ×1 IMPLANT
KIT TURNOVER KIT B (KITS) ×1 IMPLANT
MARKER SKIN DUAL TIP RULER LAB (MISCELLANEOUS) IMPLANT
MODULE EMG NDL SSEP NVM5 (NEUROSURGERY SUPPLIES) IMPLANT
NDL HYPO 22X1.5 SAFETY MO (MISCELLANEOUS) ×2 IMPLANT
NDL SPNL 22GX3.5 QUINCKE BK (NEEDLE) ×2 IMPLANT
PACK LAMINECTOMY NEURO (CUSTOM PROCEDURE TRAY) ×1 IMPLANT
PAD ARMBOARD POSITIONER FOAM (MISCELLANEOUS) ×3 IMPLANT
PIN DISTRACTION 14MM (PIN) IMPLANT
PIN DISTRACTION TAP 12 DISP (PIN) IMPLANT
PLATE ACP 1.6 18 (Plate) IMPLANT
RESTRAINT LIMB HOLDER UNIV (RESTRAINTS) IMPLANT
ROPE TRACTION 120 (SOFTGOODS) IMPLANT
SCREW ACP 3.5X17 S/D VARIA (Screw) IMPLANT
SCREW ACP VA ST 4.0 X 17 (Screw) IMPLANT
SOLN 0.9% NACL POUR BTL 1000ML (IV SOLUTION) ×1 IMPLANT
SOLN STERILE WATER BTL 1000 ML (IV SOLUTION) ×1 IMPLANT
SPIKE FLUID TRANSFER (MISCELLANEOUS) ×1 IMPLANT
SPONGE INTESTINAL PEANUT (DISPOSABLE) ×1 IMPLANT
SPONGE SURGIFOAM ABS GEL SZ50 (HEMOSTASIS) IMPLANT
SUT MNCRL AB 4-0 PS2 18 (SUTURE) ×1 IMPLANT
SUT SILK 2 0 TIES 10X30 (SUTURE) IMPLANT
SUT VIC AB 3-0 SH 8-18 (SUTURE) ×1 IMPLANT
SYR 3ML LL SCALE MARK (SYRINGE) IMPLANT
SYR 5ML LL (SYRINGE) IMPLANT
TAPE CLOTH 3X10 TAN LF (GAUZE/BANDAGES/DRESSINGS) ×1 IMPLANT
TOWEL GREEN STERILE (TOWEL DISPOSABLE) ×1 IMPLANT
TOWEL GREEN STERILE FF (TOWEL DISPOSABLE) ×1 IMPLANT

## 2023-12-09 NOTE — Progress Notes (Signed)
 Mobility Specialist: Progress Note   12/09/23 0851  Therapy Vitals  Temp 98 F (36.7 C)  Temp Source Oral  Pulse Rate 67  BP 121/74  Patient Position (if appropriate) Lying  Oxygen Therapy  SpO2 98 %  O2 Device Room Air  Mobility  Activity Ambulated with assistance  Level of Assistance Standby assist, set-up cues, supervision of patient - no hands on  Assistive Device None  Distance Ambulated (ft) 200 ft  Activity Response Tolerated well  Mobility Referral Yes  Mobility visit 1 Mobility  Mobility Specialist Start Time (ACUTE ONLY) [id number]  Mobility Specialist Stop Time (ACUTE ONLY) 0935  Mobility Specialist Time Calculation (min) (ACUTE ONLY) 8 min    Pt received in bed, agreeable to mobility session. SV throughout. Mild unsteadiness but no overt LOB. C/o gout pain in the L elbow (redness and swelling noted) in addition to neck and R foot pain. Returned to room. Left in bed with all needs met, call bell in reach.   [name] Mobility Specialist Please contact via SecureChat or Rehab office at [phone]

## 2023-12-09 NOTE — Transfer of Care (Signed)
 Immediate Anesthesia Transfer of Care Note  Patient: Brandon Miller  Procedure(s) Performed: ANTERIOR CERVICAL DECOMPRESSION/DISCECTOMY FUSION 1 LEVEL/HARDWARE REMOVAL CERVICAL ANTERIOR DISC ARTHROPLASTY  Patient Location: PACU  Anesthesia Type:General  Level of Consciousness: awake, alert , and oriented  Airway & Oxygen Therapy: Patient Spontanous Breathing and Patient connected to nasal cannula oxygen  Post-op Assessment: Report given to RN, Post -op Vital signs reviewed and stable, and Patient moving all extremities X 4  Post vital signs: Reviewed and stable  Last Vitals:  Vitals Value Taken Time  BP 121/73 12/09/23 15:55  Temp    Pulse 89 12/09/23 15:57  Resp 14 12/09/23 15:57  SpO2 93 % 12/09/23 15:57  Vitals shown include unfiled device data.  Last Pain:  Vitals:   12/09/23 1218  TempSrc:   PainSc: 5       Patients Stated Pain Goal: 2 (12/09/23 1218)  Complications: No notable events documented.

## 2023-12-09 NOTE — Progress Notes (Signed)
 Assessment 58 y/o M w/ hx smoking, alcohol abuse who presents with acute on chronic cervical myelopathy, C3-4 degenerative stenosis with severe central stenosis, C5-6 acute disc herniation, with severe central stenosis. Underwent C3-4 ACDF C5-6 CDA on 11/17  LOS: 3 days    Plan: Postop films AAT DAT Ok for DVT ppx on 11/19. Pt appeared to have worsening thrombocytopenia in association with use of SQH - defer to primary team   Subjective: Pt awoke without issue  Objective: Vital signs in last 24 hours: Temp:  [97.8 F (36.6 C)-99.1 F (37.3 C)] 98.2 F (36.8 C) (11/17 1133) Pulse Rate:  [58-71] 64 (11/17 1133) Resp:  [17-18] 17 (11/17 1133) BP: (104-128)/(60-102) 128/72 (11/17 1133) SpO2:  [95 %-100 %] 98 % (11/17 1133) Weight:  [104.3 kg] 104.3 kg (11/17 1133)  Intake/Output from previous day: 11/16 0701 - 11/17 0700 In: -  Out: 600 [Urine:600] Intake/Output this shift: Total I/O In: 1395.5 [I.V.:1029.5; Blood:266; IV Piggyback:100] Out: 25 [Blood:25]  Exam: RUE: 4/5 deltoid, 4/5 bicep, 4/5 wrist extension, 4/5 tricep, 4/5 grip LUE: 5/5 deltoid, 4/5 bicep, 5/5 wrist extension, 4/5 tricep, 5/5 grip RLE: 5/5 IP, 5/5 quad, 5/5 ham, 5/5 DF, 5/5 EHL, 5/5 PF LLE: 5/5 IP, 5/5 quad, 5/5 ham, 5/5 DF, 5/5 EHL, 5/5 PF Subjective numbness b/l hands Incision c/d/I, flat  Lab Results: Recent Labs    12/07/23 0526 12/09/23 0434  WBC 6.4 9.2  HGB 14.6 14.8  HCT 41.3 41.9  PLT 98* 90*   BMET Recent Labs    12/07/23 0526 12/09/23 0434  NA 137 133*  K 3.9 3.7  CL 102 98  CO2 25 26  GLUCOSE 97 95  BUN 19 13  CREATININE 0.92 1.06  CALCIUM  8.4* 8.7*    Studies/Results: DG C-Arm 1-60 Min-No Report Result Date: 12/09/2023 Fluoroscopy was utilized by the requesting physician.  No radiographic interpretation.   DG C-Arm 1-60 Min-No Report Result Date: 12/09/2023 Fluoroscopy was utilized by the requesting physician.  No radiographic interpretation.   DG C-Arm 1-60  Min-No Report Result Date: 12/09/2023 Fluoroscopy was utilized by the requesting physician.  No radiographic interpretation.   DG Cervical Spine With Flex & Extend Result Date: 12/08/2023 EXAM: FLEXION AND EXTENSION (2) VIEW(S) XRAY OF THE CERVICAL SPINE 12/08/2023 04:21:00 PM COMPARISON: CT 11/21/2023. CLINICAL HISTORY: 105500 Myelopathy (HCC) 105500 Myelopathy (HCC) FINDINGS: BONES: No acute fracture. No aggressive appearing osseous lesion. Alignment is normal. No dynamic instability on lateral flexion extension. Adjacent discogenic sclerosis in the vertebral bodies at C3-C4. DISCS AND DEGENERATIVE CHANGES: Moderate narrowing C3-C4 interspace. Mild narrowing C5-C6 with early endplate spurring. SOFT TISSUES: No prevertebral soft tissue swelling. The visualized lungs appear clear. Multiple missing teeth with dental implants. IMPRESSION: 1. Moderate narrowing of the C3-4 interspace with adjacent discogenic sclerosis in the vertebral bodies. 2. Mild narrowing of the C5-6 interspace with early endplate spurring. 3. No dynamic instability on lateral flexion-extension. Electronically signed by: Katheleen Faes MD 12/08/2023 06:06 PM EST RP Workstation: [id number]      Dorn JONELLE Glade 12/09/2023, 4:07 PM

## 2023-12-09 NOTE — Consult Note (Signed)
 Neurosurgery Consult Note  Assessment:  58 y/o M w/ hx smoking, alcohol abuse who presents with acute on chronic cervical myelopathy, C3-4 degenerative stenosis with severe central stenosis, C5-6 acute disc herniation, with severe central stenosis  Plan:  OR today for C3-4 ACDF C5-6 CDA 2 units of Plt ordered for transfusion NPO    CC: right arm weak  HPI:     Patient is a 58 y.o. male w/ hx alcohol use, DM2 A1c 5.2, smoking who had pre-existing several month history of bilateral hand numbness and weakness. Two weeks ago, he felt acute onset shooting pain into his neck and bilateral arms exacerbated by neck movement. He also has acute onset significant weakness of his right proximal arm and right hand. He was seen in the ED last week with plans for follow-up today. However, he represented to the ED a couple days ago because he began experiencing BLE weakness and gait instability  I discussed a C3-4 ACDF C5-6 CDA with him. I believe C3-4 needs a fusion because of severe degeneration at that level. I believe C5-6 would be appropriate for a CDA as the DDD is not severe. Both levels have severe spinal cord compression and need to be treated I/s/o myelopathy. Also, there is an intervening C4-5 level which is asymptomatic. Performing a CDA at C5-6 would be beneficial in preventing accelerated C4-5 degeneration which would otherwise be between 2 fusion masses. I explained the risk of bleeding especially with low platelets, dysphagia, dysphonia, infection, hardware failure, spinal cord injury, nerve injury, CSF leak, mortality, cardiorespiratory risk. He verbalized understanding   Patient Active Problem List   Diagnosis Date Noted   Cervical spinal cord compression (HCC) 12/07/2023   Neck pain 12/06/2023   Cognitive complaints 10/11/2020   Abnormal thyroid  screen (blood) 05/23/2020   Thrombocytopenia 05/23/2020   History of seizures 05/18/2020   Alcohol-induced mood disorder with depressive  symptoms (HCC) 05/18/2020   Substance induced mood disorder (HCC) 05/17/2020   Subdural hematoma (HCC) 02/03/2020   Arthritis 01/09/2020   Gout 01/09/2020   Type II diabetes mellitus, uncontrolled 01/09/2020   Falls 08/10/2019   Numbness and tingling of upper and lower extremities of both sides 08/10/2019   Balance problem 08/10/2019   Medication side effect 08/10/2019   Alcohol withdrawal (HCC) 08/10/2019   GERD (gastroesophageal reflux disease) 02/06/2019   Attention deficit hyperactivity disorder (ADHD) 11/17/2018   Tobacco dependence 11/17/2018   Alcohol dependence (HCC) 11/17/2018   History of diet-controlled diabetes 11/17/2018   Elevated LFTs 11/05/2018   Acute gastroenteritis 10/31/2018   Sleep apnea    Generalized abdominal pain    Non-intractable vomiting    Diarrhea    ETOH abuse    Ileus, unspecified (HCC) 10/29/2018   Severe recurrent major depression without psychotic features (HCC) 08/15/2018   Alcohol withdrawal seizure with perceptual disturbance (HCC)    Alcohol abuse with unspecified alcohol-induced disorder 02/05/2018   Transaminasemia 02/05/2018   Anxiety and depression    Alcohol withdrawal seizure without complication (HCC) 02/04/2018   DJD (degenerative joint disease) of knee 08/25/2014   Diabetes mellitus, type 2 (HCC) 10/31/2011   Hyperglycemia 10/31/2011   PNA (pneumonia) 10/30/2011   LEG PAIN, RIGHT 01/20/2010   Past Medical History:  Diagnosis Date   Allergy    Anemia    Anxiety    Arthritis    RA   Complication of anesthesia    hard to wake up-was told he may have sleep apnea-never tested   Depression  Diabetes mellitus without complication (HCC)    with weight no more problems   Seizure (HCC)    had one seizure of unknown etiology a few tears ago, none since and on no meds for them   Sleep apnea    Snores     Past Surgical History:  Procedure Laterality Date   BIOPSY  02/12/2019   Procedure: BIOPSY;  Surgeon: Shaaron Lamar HERO, MD;   [facility];  Service: Endoscopy;;  stomach   COLONOSCOPY WITH PROPOFOL  N/A 02/12/2019   Procedure: COLONOSCOPY WITH PROPOFOL ;  Surgeon: Shaaron Lamar HERO, MD;  [facility];  Service: Endoscopy;  Laterality: N/A;  10:30am   ELBOW SURGERY Right 2010   Dr. Beverley   ESOPHAGOGASTRODUODENOSCOPY (EGD) WITH PROPOFOL  N/A 02/12/2019   Procedure: ESOPHAGOGASTRODUODENOSCOPY (EGD) WITH PROPOFOL ;  Surgeon: Shaaron Lamar HERO, MD;  [facility];  Service: Endoscopy;  Laterality: N/A;   HERNIA REPAIR     Right inguinal   KNEE ARTHROSCOPY WITH MEDIAL MENISECTOMY Right 06/11/2013   Procedure: RIGHT KNEE ARTHROSCOPY WITH MEDIAL MENISECTOMY AND CHONDROPLASTY;  Surgeon: Toribio JULIANNA Beverley, MD;  Location: [facility];  Service: Orthopedics;  Laterality: Right;  Chondroplasty, loose bodies, medial menisectomy, lysis of adhesions.   KNEE SURGERY Right 2011   POLYPECTOMY  02/12/2019   Procedure: POLYPECTOMY;  Surgeon: Shaaron Lamar HERO, MD;  [facility];  Service: Endoscopy;;   right ankle     SHOULDER SURGERY Right 2010   Dr. Beverley   TOTAL KNEE ARTHROPLASTY Right 08/25/2014   Procedure: TOTAL KNEE ARTHROPLASTY;  Surgeon: Toribio JULIANNA Beverley, MD;  Location: [facility] OR;  Service: Orthopedics;  Laterality: Right;    Medications Prior to Admission  Medication Sig Dispense Refill Last Dose/Taking   acetaminophen  (TYLENOL ) 500 MG tablet Take 1,000 mg by mouth every 6 (six) hours as needed for mild pain (pain score 1-3).   Past Month   amphetamine -dextroamphetamine  (ADDERALL) 20 MG tablet Take 20 mg by mouth 2 (two) times daily.   12/06/2023 Evening   baclofen (LIORESAL) 10 MG tablet Take 10 mg by mouth 3 (three) times daily as needed (pain).   12/06/2023 Morning   buPROPion  (WELLBUTRIN  XL) 150 MG 24 hr tablet Take 150 mg by mouth every morning.   12/06/2023 Morning   colchicine  0.6 MG tablet Take 1 tablet (0.6 mg total) by mouth 2 (two) times daily. 14 tablet 0 Past Week    diazepam (VALIUM) 5 MG tablet Take 0.5 tablets (2.5 mg total) by mouth every 12 (twelve) hours as needed for anxiety. 7 tablet 0 Unknown   gabapentin  (NEURONTIN ) 600 MG tablet Take 600 mg by mouth 3 (three) times daily.   12/03/2023   ibuprofen  (ADVIL ) 200 MG tablet Take 400 mg by mouth every 6 (six) hours as needed for mild pain (pain score 1-3).   Past Month   mirtazapine  (REMERON ) 30 MG tablet Take 1 tablet (30 mg total) by mouth at bedtime. (Patient taking differently: Take 30 mg by mouth every other day.) 30 tablet 0 12/05/2023   naproxen (NAPROSYN) 500 MG tablet Take 1 tablet (500 mg total) by mouth 2 (two) times daily. 30 tablet 0 Past Week   [EXPIRED] oxyCODONE  (ROXICODONE ) 5 MG immediate release tablet Take 1 tablet (5 mg total) by mouth every 6 (six) hours as needed for up to 3 days for severe pain (pain score 7-10). 14 tablet 0 12/06/2023 Morning   methocarbamol  (ROBAXIN ) 500 MG tablet Take 1 tablet (500 mg  total) by mouth 2 (two) times daily. 20 tablet 0 Unknown   predniSONE  (STERAPRED UNI-PAK 21 TAB) 10 MG (21) TBPK tablet Take 10 mg by mouth daily. Take 6 tablets by mouth daily for 2 days, then 5 tablets daily for 2 days, then 4 tablets daily for 2 days, then 3 tablets daily for 2 days, then 2 tablets daily for 2 days, then 1 tablet daily for 2 days.   Unknown   Allergies  Allergen Reactions   Tizanidine Hcl Anaphylaxis and Swelling   Allopurinol  Other (See Comments)    Elevated liver enzymes   Indomethacin Nausea And Vomiting, Nausea Only and Swelling   Daypro [Oxaprozin] Other (See Comments)    Tears his stomach up   Diclofenac Sodium Other (See Comments)    Dizzy    Prednisone  Other (See Comments)    Gets very mean    Social History   Tobacco Use   Smoking status: Every Day    Current packs/day: 1.50    Average packs/day: 1.5 packs/day for 25.0 years (37.5 ttl pk-yrs)    Types: Cigarettes   Smokeless tobacco: Never  Substance Use Topics   Alcohol use: Yes     Comment: beer daily    Family History  Problem Relation Age of Onset   Hepatitis Mother    Cirrhosis Mother        Did not drink   Pneumonia Father    Kidney failure Father    Gastric cancer Brother        ? Gastric CA spread to esophagus   Colon cancer Neg Hx      Review of Systems Pertinent items are noted in HPI.  Objective:   Patient Vitals for the past 8 hrs:  BP Temp Pulse Resp SpO2  12/09/23 0443 104/60 99.1 F (37.3 C) 63 17 95 %  12/09/23 0132 106/70 98.3 F (36.8 C) 61 17 97 %   I/O last 3 completed shifts: In: 350 [P.O.:350] Out: 2300 [Urine:2300] No intake/output data recorded.    Exam: RUE: 4/5 deltoid, 4/5 bicep, 4/5 wrist extension, 4/5 tricep, 4/5 grip LUE: 5/5 deltoid, 4/5 bicep, 5/5 wrist extension, 4/5 tricep, 5/5 grip RLE: 5/5 IP, 5/5 quad, 5/5 ham, 5/5 DF, 5/5 EHL, 5/5 PF LLE: 5/5 IP, 5/5 quad, 5/5 ham, 5/5 DF, 5/5 EHL, 5/5 PF Subjective numbness b/l hands      Data ReviewCBC:  Lab Results  Component Value Date   WBC 9.2 12/09/2023   RBC 4.61 12/09/2023   BMP:  Lab Results  Component Value Date   GLUCOSE 95 12/09/2023   CO2 26 12/09/2023   BUN 13 12/09/2023   CREATININE 1.06 12/09/2023   CREATININE 1.04 02/03/2019   CALCIUM  8.7 (L) 12/09/2023

## 2023-12-09 NOTE — Progress Notes (Signed)
 PROGRESS NOTE    Brandon Miller  FMW:[id number] DOB: October 13, 1965 DOA: 12/06/2023 PCP: [facility]  Chief Complaint  Patient presents with   Neck Pain    Brief Narrative:   58 y.o. male with medical history significant of anxiety, depression, tobacco abuse who presents to the emergency department due to worsening neck pain, right arm weakness, and leg weakness. The patient sustained Shuntell Foody mechanical fall about 3 weeks prior to this admission.  He initially complained of shoulder pain, but eventually developed some neck pain with radiation to the arms.  He was seen in the ED and treated with Valium, Toradol  and lidocaine  patch.  He was discharged with Lidoderm , Valium and naproxen.  Patient has since followed up with orthopedic Provider where he received injections to right shoulder and neck without any improvement.  He came to the emergency department on 12/04/2023.  At that time, MRI of cervical spine shows C3-C4 severe DDD and central stenosis due to left paracentric disc osteophyte and C5-6 severe stenosis.  Neurosurgery (Dr. Darnella) consulted and recommended Medrol  Dosepak and pain medications and for outpatient follow-up on Monday 11/17.  He presented to the ER today due to worsening pain with weakness in her arms and legs and some trouble walking, but denies urinary/bowel incontinence.  Patient denies any new falls.    ED course In the emergency department, he was afebrile hemodynamically stable.  WBC 8.1, hemoglobin 16.0, was 159.  Sodium 137, potassium 3.9, bicarbonate 25, serum creatinine 0.92.  AST 97, ALT 94, alk phosphatase 92, total bilirubin 1.5.  Dilaudid  was given, IV hydration was provided. Neurosurgery (NP Meyran) was consulted who recommended patient be admitted to [facility] and that neurosurgery team will consult on him on arrival.  Assessment & Plan:   Principal Problem:   Neck pain Active Problems:   Cervical spinal cord compression  (HCC)  Cervical myelopathy/cervical cord compression -severe spinal canal stenosis and spinal cord compression at C5-6 due to bulding disc osteophyte complex and caudal central disc extrusion with mildly increased T2 signal in the cord.  Severe L sided spinal canal stenosis and severe L neural foraminal stenosis at C3-4 due to Caelum Federici left posterolateral disc osteophyte complex resulting in L spinal cord compression and likely left 4 nerve compression.  Moderate R sided spinal canal and neural foraminal stenosis at this level.  (See MRI report) -Pain management, bowel regimen -neurosurgery c/s, appreciate recs - to the OR today   Left Elbow Swelling Based on his report, suspicious for gout flare Uric acid level 6.9 Will trial prednisone   Depression with anxiety Continue Wellbutrin , Valium, Remeron    Tobacco abuse Patient was counseled on tobacco abuse cessation Continue nicotine  patch   Alcohol dependence - Patient drinks 4-5 beers daily - Alcohol withdrawal protocol    DVT prophylaxis: heparin Code Status: full Family Communication: none Disposition:   Status is: Inpatient Remains inpatient appropriate because: need for continued nsgy consult   Consultants:  neurosurgery  Procedures:  none  Antimicrobials:  Anti-infectives (From admission, onward)    Start     Dose/Rate Route Frequency Ordered Stop   12/09/23 1034  ceFAZolin  (ANCEF ) IVPB 2g/100 mL premix        2 g 200 mL/hr over 30 Minutes Intravenous 30 min pre-op 12/09/23 1034 12/09/23 1334       Subjective: Continued L elbow pain   Objective: Vitals:   12/09/23 0851 12/09/23 1030 12/09/23 1040 12/09/23 1133  BP: 121/74 116/70 (!) 114/102 128/72  Pulse: 67 (!) 58 (!) 59 64  Resp:  17 18 17   Temp: 98 F (36.7 C) 98.7 F (37.1 C) 97.8 F (36.6 C) 98.2 F (36.8 C)  TempSrc: Oral Oral Oral Oral  SpO2: 98% 98% 97% 98%  Weight:    104.3 kg  Height:    6' 3 (1.905 m)    Intake/Output Summary (Last 24  hours) at 12/09/2023 1535 Last data filed at 12/09/2023 1525 Gross per 24 hour  Intake 1395.5 ml  Output --  Net 1395.5 ml   Filed Weights   12/06/23 1859 12/09/23 1133  Weight: 104.3 kg 104.3 kg    Examination:  General: No acute distress. Cardiovascular: RRR Lungs: unlabored Neurological: Alert and oriented 3. Moves all extremities 4. Cranial nerves II through XII grossly intact. Extremities: L elbow swelling and TTP  Data Reviewed: I have personally reviewed following labs and imaging studies  CBC: Recent Labs  Lab 12/04/23 0743 12/06/23 2052 12/07/23 0526 12/09/23 0434  WBC  --  8.1 6.4 9.2  NEUTROABS  --  5.7  --  6.6  HGB 17.0 16.0 14.6 14.8  HCT 50.0 44.4 41.3 41.9  MCV  --  90.4 92.4 90.9  PLT  --  111* 98* 90*    Basic Metabolic Panel: Recent Labs  Lab 12/04/23 0743 12/06/23 2052 12/07/23 0526 12/09/23 0434  NA 139 135 137 133*  K 4.0 3.8 3.9 3.7  CL 108 99 102 98  CO2  --  24 25 26   GLUCOSE 123* 102* 97 95  BUN 25* 19 19 13   CREATININE 1.30* 0.90 0.92 1.06  CALCIUM   --  8.8* 8.4* 8.7*  MG  --   --  2.6* 2.5*  PHOS  --   --  3.5 3.3    GFR: Estimated Creatinine Clearance: 100.5 mL/min (by C-G formula based on SCr of 1.06 mg/dL).  Liver Function Tests: Recent Labs  Lab 12/07/23 0526 12/09/23 0434  AST 97* 73*  ALT 94* 72*  ALKPHOS 92 60  BILITOT 1.5* 2.3*  PROT 6.2* 6.7  ALBUMIN 4.0 3.2*    CBG: Recent Labs  Lab 12/09/23 1141  GLUCAP 92     Recent Results (from the past 240 hours)  Surgical pcr screen     Status: None   Collection Time: 12/09/23 12:11 PM   Specimen: Nasal Mucosa; Nasal Swab  Result Value Ref Range Status   MRSA, PCR NEGATIVE NEGATIVE Final   Staphylococcus aureus NEGATIVE NEGATIVE Final    Comment: (NOTE) The Xpert SA Assay (FDA approved for NASAL specimens in patients 37 years of age and older), is one component of [facility] comprehensive surveillance program. It is not intended to diagnose infection nor  to guide or monitor treatment. Performed at [facility], [address] [place], [place] [address]          Radiology Studies: DG C-Arm 1-60 Min-No Report Result Date: 12/09/2023 Fluoroscopy was utilized by the requesting physician.  No radiographic interpretation.   DG C-Arm 1-60 Min-No Report Result Date: 12/09/2023 Fluoroscopy was utilized by the requesting physician.  No radiographic interpretation.   DG C-Arm 1-60 Min-No Report Result Date: 12/09/2023 Fluoroscopy was utilized by the requesting physician.  No radiographic interpretation.   DG Cervical Spine With Flex & Extend Result Date: 12/08/2023 EXAM: FLEXION AND EXTENSION (2) VIEW(S) XRAY OF THE CERVICAL SPINE 12/08/2023 04:21:00 PM COMPARISON: CT 11/21/2023. CLINICAL HISTORY: 105500 Myelopathy (HCC) 105500 Myelopathy (HCC) FINDINGS: BONES: No acute fracture. No aggressive  appearing osseous lesion. Alignment is normal. No dynamic instability on lateral flexion extension. Adjacent discogenic sclerosis in the vertebral bodies at C3-C4. DISCS AND DEGENERATIVE CHANGES: Moderate narrowing C3-C4 interspace. Mild narrowing C5-C6 with early endplate spurring. SOFT TISSUES: No prevertebral soft tissue swelling. The visualized lungs appear clear. Multiple missing teeth with dental implants. IMPRESSION: 1. Moderate narrowing of the C3-4 interspace with adjacent discogenic sclerosis in the vertebral bodies. 2. Mild narrowing of the C5-6 interspace with early endplate spurring. 3. No dynamic instability on lateral flexion-extension. Electronically signed by: Dayne Hassell MD 12/08/2023 06:06 PM EST RP Workstation: [id number]        Scheduled Meds:  [MAR Hold] buPROPion   150 mg Oral q morning   chlorhexidine        [MAR Hold] colchicine   0.6 mg Oral Daily   [MAR Hold] feeding supplement  237 mL Oral BID BM   [MAR Hold] folic acid   1 mg Oral Daily   [MAR Hold] methocarbamol   500 mg Oral BID   [MAR Hold] mirtazapine   45 mg  Oral QHS   [MAR Hold] multivitamin with minerals  1 tablet Oral Daily   [MAR Hold] nicotine   7 mg Transdermal Daily   remifentanil (ULTIVA) 2 mg in 100 mL normal saline (20 mcg/mL) Optime  0.15 mcg/kg/min Intravenous To OR   [MAR Hold] thiamine   100 mg Oral Daily   Or   [MAR Hold] thiamine   100 mg Intravenous Daily   Continuous Infusions:  lactated ringers  10 mL/hr at 12/09/23 1519     LOS: 3 days    Time spent: over 30 min    Meliton Monte, MD [facility]   To contact the attending provider between 7A-7P or the covering provider during after hours 7P-7A, please log into the web site [email or web address] and access using universal [place] password for that web site. If you do not have the password, please call the hospital operator.  12/09/2023, 3:35 PM

## 2023-12-09 NOTE — Anesthesia Postprocedure Evaluation (Signed)
 Anesthesia Post Note  Patient: Seena Dante Dustman  Procedure(s) Performed: ANTERIOR CERVICAL DECOMPRESSION/DISCECTOMY FUSION 1 LEVEL/HARDWARE REMOVAL CERVICAL ANTERIOR DISC ARTHROPLASTY     Patient location during evaluation: PACU Anesthesia Type: General Level of consciousness: awake and alert Pain management: pain level controlled Vital Signs Assessment: post-procedure vital signs reviewed and stable Respiratory status: spontaneous breathing, nonlabored ventilation, respiratory function stable and patient connected to nasal cannula oxygen Cardiovascular status: blood pressure returned to baseline and stable Postop Assessment: no apparent nausea or vomiting Anesthetic complications: no   No notable events documented.  Last Vitals:  Vitals:   12/09/23 1555 12/09/23 1615  BP:  120/70  Pulse:  90  Resp:  18  Temp: 36.7 C   SpO2:  95%    Last Pain:  Vitals:   12/09/23 1732  TempSrc:   PainSc: 6                  Thom JONELLE Peoples

## 2023-12-09 NOTE — TOC CM/SW Note (Signed)
 TOC consult received for hx of substance abuse. SW to f/u with patient.   Merilee Batty, MSN, RN Case Management [phone]

## 2023-12-09 NOTE — Op Note (Signed)
 PREOP DIAGNOSIS: acute on chronic cervical myelopathy. C3-4 degenerative stenosis. C5-6 acute disc herniation  POSTOP DIAGNOSIS: same   PROCEDURE: C3-C4 anterior cervical discectomy and fusion C5-C6 cervical disc arthroplasty  SURGEON: Dr. Dorn Glade, MD  ASSISTANT: None  ANESTHESIA: General Endotracheal  EBL: 100 cc  IMPLANTS: NuVasive modulus and ACP plate NuVasive Simplify  SPECIMENS: None  DRAINS: None  COMPLICATIONS: None immediate  NEUROMONITORING: Stable throughout the case  CONDITION: Hemodynamically stable to PACU  HISTORY: 58 year old male with history of alcoholism who presented with acute on chronic cervical myelopathy.  He had pre-existing bilateral hand numbness and weakness.  2 weeks ago he developed acute neck pain, right arm radiculopathy, and progressively worsening gait and balance.  Imaging studies showed severe degenerative stenosis at C3-4 plus acute disc herniation at C5-6.  I believe that both levels needed a decompression.  The C3-4 level was too degenerated to properly except a disc arthroplasty.  Conversely the C5-6 level had minimal degeneration and was a good candidate for disc arthroplasty.  Additionally the middle C4-5 level was asymptomatic.  Thus I offered a combined C3-4 ACDF and C5-6 CDA. Risks, benefits, alternatives, and expected convalescence were discussed with the patient.  Risks discussed included but were not limited to bleeding, pain, infection, dysphagia, dysphonia, pseudoarthrosis, hardware failure, adjacent segment disease, CSF leak, neurologic deficits, weakness, numbness, paralysis, coma, and death. After all questions were answered, informed consent was obtained.  PROCEDURE IN DETAIL: The patient was brought to the operating room and transferred to the operative table. After induction of general anesthesia, the patient was positioned on the operative table in the supine position with all pressure points meticulously padded. The  skin of the neck was then prepped and draped in the usual sterile fashion. Preoperative antibiotics were administered. Lateral fluoroscopy was used to identify the appropriate level.  After timeout was conducted, the skin was infiltrated with local anesthetic. Skin incision was then made sharply and Bovie electrocautery was used to dissect the subcutaneous tissue until the platysma was identified. The platysma was then divided and undermined. The sternocleidomastoid muscle was then identified and, utilizing natural fascial planes in the neck, the prevertebral fascia was identified and the carotid sheath was retracted laterally and the trachea and esophagus retracted medially. Again using fluoroscopy, the C5-6 disc space was identified. Bovie electrocautery was used to dissect in the subperiosteal plane and elevate the bilateral longus coli muscles. Self-retaining retractors were then placed. Caspar distraction pins were placed in the adjacent bodies to allow for gentle distraction.  At this point, the microscope was draped and brought into the field.  The C5-6 disc space was incised sharply and combination of rongeur's and curettes were used to initially complete the discectomy.  The high-speed drill then was used to complete the discectomy along the posterior aspect of the disc base.  Using a nerve hook, the PLL was elevated, and Kerrison rongeurs were used to remove the posterior longitudinal ligament and the ventral thecal sac was identified.  A large disc fragment was removed in the right paracentral epidural region.  Using a combination of curettes and rongeurs, complete decompression of the thecal sac and exiting nerve roots at this level was completed and verified with easy passage of the micro nerve hook centrally and in the bilateral foramina. The wound was copiously irrigated to remove any bone fragments.  Bleeding bone was covered with bone wax.  Having completed our decompression, attention was  turned to placement of the intervertebral device.  Trial spacers  and fluoroscopy were used to select a Globus Simplify size 4mm graft.  We then used the cutting instrument to create rostral and caudal troughs for the device under fluoroscopy.  We then inserted the graft under live fluoroscopy.  AP and lateral fluoroscopy were used to confirm proper placement.  Attention was then turned to the C3-4 level. Fluoroscopy was used to confirm the appropriate C3-4 level. Caspar distraction pin was placed in the adjacent body to allow for gentle distraction of the disc space.  Discectomy was completed initially with curettes and rongeurs, and completed with the drill.  Local autograft was harvested throughout this process for later arthrodesis. The PLL was again identified, elevated and incised. Using Kerrison rongeurs, decompression of the spinal cord and exiting roots was completed and confirmed with a dissector. Trial spacers were used to select a Nuvasive Modulus 6mm graft. This graft was then filled with morcellized allograft plus locally harvested autograft, and inserted under live fluoroscopy.  After placement of the C3-4 intervertebral device, the caspar pins were removed.  An anterior cervical Nuvasive ACP 18mm plate was placed across the C3-4 interspace for anterior fixation.  Using a high-speed drill, the cortex of the cervical vertebral bodies was punctured, and screws inserted to secure the plate. Final fluoroscopic images in AP and lateral projections were taken to confirm good hardware placement.  At this point, after all counts were verified to be correct, meticulous hemostasis was secured using a combination of bipolar electrocautery and passive hemostatics. The platysma muscle was then closed using interrupted 3-0 Vicryl sutures, and the skin was closed with a 4-0 monocryl in subcutical fashion. Sterile dressings were then applied and the drapes removed.  The patient tolerated the procedure well and  was extubated in the room and taken to the postanesthesia care unit in stable condition.  All counts were correct at the end of the procedure.

## 2023-12-09 NOTE — Plan of Care (Signed)

## 2023-12-09 NOTE — Anesthesia Preprocedure Evaluation (Addendum)
 Anesthesia Evaluation  Patient identified by MRN, date of birth, ID band Patient awake    Reviewed: Allergy & Precautions, H&P , NPO status , Patient's Chart, lab work & pertinent test results  History of Anesthesia Complications Negative for: history of anesthetic complications  Airway Mallampati: II  TM Distance: >3 FB Neck ROM: Full    Dental  (+) Edentulous Upper, Edentulous Lower   Pulmonary neg pulmonary ROS, sleep apnea , Current Smoker and Patient abstained from smoking.   Pulmonary exam normal breath sounds clear to auscultation       Cardiovascular (-) angina (-) Past MI Normal cardiovascular exam Rhythm:Regular Rate:Normal     Neuro/Psych Seizures -, Well Controlled,  PSYCHIATRIC DISORDERS Anxiety Depression    1. Severe spinal canal stenosis and spinal cord compression at C5-6 due to a bulging disc osteophyte complex and caudal central disc extrusion   Hx of seizure x1 following alcohol withdrawal   negative psych ROS   GI/Hepatic ,GERD  ,,(+)     substance abuse  alcohol use  Endo/Other  diabetes, Type 2    Renal/GU negative Renal ROS  negative genitourinary   Musculoskeletal negative musculoskeletal ROS (+) Arthritis ,    Abdominal   Peds negative pediatric ROS (+)  Hematology  (+) Blood dyscrasia, anemia Thrombocytopenic  2 units plt being transfused perioperatively   Anesthesia Other Findings   Reproductive/Obstetrics negative OB ROS                              Anesthesia Physical Anesthesia Plan  ASA: 3  Anesthesia Plan: General   Post-op Pain Management: Ofirmev  IV (intra-op)*   Induction: Intravenous  PONV Risk Score and Plan: 1  Airway Management Planned: Oral ETT and Video Laryngoscope Planned  Additional Equipment: None  Intra-op Plan:   Post-operative Plan: Extubation in OR  Informed Consent: I have reviewed the patients History and Physical,  chart, labs and discussed the procedure including the risks, benefits and alternatives for the proposed anesthesia with the patient or authorized representative who has indicated his/her understanding and acceptance.     Dental advisory given  Plan Discussed with: CRNA  Anesthesia Plan Comments: (58 y/o M w/ hx smoking, alcohol abuse who presents with acute on chronic cervical myelopathy, C3-4 degenerative stenosis with severe central stenosis, C5-6 acute disc herniation, with severe central stenosis)         Anesthesia Quick Evaluation

## 2023-12-09 NOTE — Anesthesia Procedure Notes (Signed)
 Procedure Name: Intubation Date/Time: 12/09/2023 1:01 PM  Performed by: Boyce Shilling, CRNAPre-anesthesia Checklist: Patient identified, Emergency Drugs available, Suction available, Timeout performed and Patient being monitored Patient Re-evaluated:Patient Re-evaluated prior to induction Oxygen Delivery Method: Circle system utilized Preoxygenation: Pre-oxygenation with 100% oxygen Induction Type: IV induction Ventilation: Mask ventilation without difficulty and Oral airway inserted - appropriate to patient size Laryngoscope Size: Glidescope and 4 Grade View: Grade I Tube type: Oral Tube size: 7.5 mm Number of attempts: 1 Airway Equipment and Method: Stylet Placement Confirmation: ETT inserted through vocal cords under direct vision, positive ETCO2, CO2 detector and breath sounds checked- equal and bilateral Secured at: 23 cm Tube secured with: Tape Dental Injury: Teeth and Oropharynx as per pre-operative assessment  Comments: Glidescope intubation due to cervical stenosis

## 2023-12-09 NOTE — Plan of Care (Signed)
   Problem: Education: Goal: Knowledge of General Education information will improve Description: Including pain rating scale, medication(s)/side effects and non-pharmacologic comfort measures Outcome: Progressing   Problem: Health Behavior/Discharge Planning: Goal: Ability to manage health-related needs will improve Outcome: Progressing   Problem: Activity: Goal: Risk for activity intolerance will decrease Outcome: Progressing

## 2023-12-10 ENCOUNTER — Other Ambulatory Visit (HOSPITAL_COMMUNITY): Payer: Self-pay

## 2023-12-10 LAB — CBC WITH DIFFERENTIAL/PLATELET
Abs Immature Granulocytes: 0.07 K/uL (ref 0.00–0.07)
Basophils Absolute: 0 K/uL (ref 0.0–0.1)
Basophils Relative: 0 %
Eosinophils Absolute: 0 K/uL (ref 0.0–0.5)
Eosinophils Relative: 0 %
HCT: 37.6 % — ABNORMAL LOW (ref 39.0–52.0)
Hemoglobin: 13.3 g/dL (ref 13.0–17.0)
Immature Granulocytes: 1 %
Lymphocytes Relative: 7 %
Lymphs Abs: 0.6 K/uL — ABNORMAL LOW (ref 0.7–4.0)
MCH: 32.7 pg (ref 26.0–34.0)
MCHC: 35.4 g/dL (ref 30.0–36.0)
MCV: 92.4 fL (ref 80.0–100.0)
Monocytes Absolute: 0.9 K/uL (ref 0.1–1.0)
Monocytes Relative: 10 %
Neutro Abs: 7.2 K/uL (ref 1.7–7.7)
Neutrophils Relative %: 82 %
Platelets: 132 K/uL — ABNORMAL LOW (ref 150–400)
RBC: 4.07 MIL/uL — ABNORMAL LOW (ref 4.22–5.81)
RDW: 13 % (ref 11.5–15.5)
WBC: 8.8 K/uL (ref 4.0–10.5)
nRBC: 0 % (ref 0.0–0.2)

## 2023-12-10 LAB — BPAM PLATELET PHERESIS
Blood Product Expiration Date: 202511202359
Blood Product Expiration Date: 202511202359
ISSUE DATE / TIME: 202511171031
ISSUE DATE / TIME: 202511171237
Unit Type and Rh: 600
Unit Type and Rh: 6200

## 2023-12-10 LAB — COMPREHENSIVE METABOLIC PANEL WITH GFR
ALT: 66 U/L — ABNORMAL HIGH (ref 0–44)
AST: 73 U/L — ABNORMAL HIGH (ref 15–41)
Albumin: 2.9 g/dL — ABNORMAL LOW (ref 3.5–5.0)
Alkaline Phosphatase: 53 U/L (ref 38–126)
Anion gap: 10 (ref 5–15)
BUN: 15 mg/dL (ref 6–20)
CO2: 24 mmol/L (ref 22–32)
Calcium: 8.3 mg/dL — ABNORMAL LOW (ref 8.9–10.3)
Chloride: 100 mmol/L (ref 98–111)
Creatinine, Ser: 0.97 mg/dL (ref 0.61–1.24)
GFR, Estimated: >60 mL/min (ref >60)
Glucose, Bld: 121 mg/dL — ABNORMAL HIGH (ref 70–99)
Potassium: 4.1 mmol/L (ref 3.5–5.1)
Sodium: 134 mmol/L — ABNORMAL LOW (ref 135–145)
Total Bilirubin: 1.3 mg/dL — ABNORMAL HIGH (ref 0.0–1.2)
Total Protein: 6.6 g/dL (ref 6.5–8.1)

## 2023-12-10 LAB — MAGNESIUM: Magnesium: 2.7 mg/dL — ABNORMAL HIGH (ref 1.7–2.4)

## 2023-12-10 LAB — PREPARE PLATELET PHERESIS
Unit division: 0
Unit division: 0

## 2023-12-10 LAB — PHOSPHORUS: Phosphorus: 2.9 mg/dL (ref 2.5–4.6)

## 2023-12-10 MED ORDER — PREDNISONE 20 MG PO TABS
ORAL_TABLET | ORAL | 0 refills | Status: AC
  Filled 2023-12-10: qty 6, 6d supply, fill #0

## 2023-12-10 MED ORDER — PANTOPRAZOLE SODIUM 40 MG PO TBEC: 40.0000 mg | DELAYED_RELEASE_TABLET | Freq: Every day | ORAL | 0 refills | Status: DC

## 2023-12-10 MED ORDER — OXYCODONE HCL 5 MG PO TABS
5.0000 mg | ORAL_TABLET | Freq: Four times a day (QID) | ORAL | 0 refills | Status: AC | PRN
  Filled 2023-12-10: qty 12, 3d supply, fill #0

## 2023-12-10 MED ORDER — PREDNISONE 20 MG PO TABS: ORAL_TABLET | ORAL | 0 refills | Status: DC

## 2023-12-10 MED ORDER — PANTOPRAZOLE SODIUM 40 MG PO TBEC
40.0000 mg | DELAYED_RELEASE_TABLET | Freq: Every day | ORAL | 0 refills | Status: AC
  Filled 2023-12-10 (×2): qty 30, 30d supply, fill #0

## 2023-12-10 MED ORDER — PREDNISONE 20 MG PO TABS
ORAL_TABLET | ORAL | 0 refills | Status: DC
  Filled 2023-12-10: qty 13, 11d supply, fill #0

## 2023-12-10 MED FILL — Thrombin For Soln 5000 Unit: CUTANEOUS | Qty: 5000 | Status: AC

## 2023-12-10 NOTE — Plan of Care (Signed)
 Patient calm and cooperative A&O X4, ambulatory. Surgical incision on neck open to air. PIV maintained. Patient left with call bell in reach and bed in lowest position.  Problem: Education: Goal: Knowledge of General Education information will improve Description: Including pain rating scale, medication(s)/side effects and non-pharmacologic comfort measures Outcome: Progressing   Problem: Clinical Measurements: Goal: Ability to maintain clinical measurements within normal limits will improve Outcome: Progressing   Problem: Activity: Goal: Risk for activity intolerance will decrease Outcome: Progressing   Problem: Nutrition: Goal: Adequate nutrition will be maintained Outcome: Progressing   Problem: Coping: Goal: Level of anxiety will decrease Outcome: Progressing   Problem: Pain Managment: Goal: General experience of comfort will improve and/or be controlled Outcome: Progressing   Problem: Safety: Goal: Ability to remain free from injury will improve Outcome: Progressing

## 2023-12-10 NOTE — Discharge Summary (Addendum)
 Physician Discharge Summary  Brandon Miller FMW:[id number] DOB: [birth year] DOA: 12/06/2023  PCP: [facility]  Admit date: 12/06/2023 Discharge date: 12/10/2023  Time spent: 40 minutes  Recommendations for Outpatient Follow-up:  Follow outpatient CBC/CMP  Follow neurosurgery outpatient post op Follow thrombocytopenia Follow mildly elevated LFT's Follow gout flare, d/c'd on steroid taper Encourage etoh cessation  Discharge Diagnoses:  Principal Problem:   Neck pain Active Problems:   Cervical spinal cord compression [facility])   Discharge Condition: stable  Diet recommendation: heart healthy  Filed Weights   12/06/23 1859 12/09/23 1133  Weight: 104.3 kg 104.3 kg    History of present illness:   58 y.o. male with medical history significant of anxiety, depression, tobacco abuse who presents to the emergency department due to worsening neck pain, right arm weakness, and leg weakness.  The patient sustained Edson Deridder mechanical fall about 3 weeks prior to this admission.  He initially complained of shoulder pain, but eventually developed some neck pain with radiation to the arms.  He was seen in the ED and treated with Valium, Toradol  and lidocaine  patch.  He was discharged with Lidoderm , Valium and naproxen.  Patient has since followed up with orthopedic Provider where he received injections to right shoulder and neck without any improvement.   He came to the emergency department on 12/04/2023.  At that time, MRI of cervical spine shows C3-C4 severe DDD and central stenosis due to left paracentric disc osteophyte and C5-6 severe stenosis.  Neurosurgery (Dr. Darnella) consulted and recommended Medrol  Dosepak and pain medications and for outpatient follow-up on Monday 11/17.    He presented again to the ER with pain and weakness in his arms and legs and some trouble walking, but denies urinary/bowel incontinence.   Now sp C3-4 ACDF and C5-6 cervical disc  arthroplasty.    Stable for discharge based on neurosurgery recs    Hospital Course:  Assessment and Plan:  Cervical myelopathy/cervical cord compression -severe spinal canal stenosis and spinal cord compression at C5-6 due to bulding disc osteophyte complex and caudal central disc extrusion with mildly increased T2 signal in the cord.  Severe L sided spinal canal stenosis and severe L neural foraminal stenosis at C3-4 due to Elleana Stillson left posterolateral disc osteophyte complex resulting in L spinal cord compression and likely left 4 nerve compression.  Moderate R sided spinal canal and neural foraminal stenosis at this level.  (See MRI report) -now s/p C3-4 anterior cervical discectomy and fusion and C5-6 cervical disc arthroplasty -discussed with Dr. Darnella, stable for discharge from nsgy standpoint today -Pain management   Left Elbow Swelling  Gout  Based on his report, suspicious for gout flare Uric acid level 6.9 Significant improvement with prednisone , will taper over next several days (shorter side with recent surgery) Uses colchicine  typically daily, continue    Thrombocytopenia Presented with platelets 111, nadired to 90 Unclear cause - has had thrombocytopenia in the past Oluwanifemi Susman few years ago - maybe chronic issue - ? Related to etoh use.  HIT seems unlikely given time course, nadir, clinical course (low 4T score).  Follow outpatient.  Depression with anxiety Continue Wellbutrin , Valium, Remeron    Tobacco abuse Patient was counseled on tobacco abuse cessation Continue nicotine  patch   Alcohol dependence Encourage cessation     Procedures: PROCEDURE: C3-C4 anterior cervical discectomy and fusion C5-C6 cervical disc arthroplasty   Consultations: neurosurgery  Discharge Exam: Vitals:   12/10/23 0747 12/10/23 0900  BP: (!) 98/59 110/63  Pulse: ROLLEN)  49 (!) 51  Resp: 18 18  Temp: 98 F (36.7 C) 97.9 F (36.6 C)  SpO2: 100% 100%   No new complaints Family member at  bedside He's comfortable discharging home  General: No acute distress. Cardiovascular: RRR Lungs: unlabored Neurological: Alert and oriented 3. Moves all extremities 4. Cranial nerves II through XII grossly intact. Extremities: L elbow redness and swelling significantly improved Discharge Instructions   Discharge Instructions     Ambulatory referral to Physical Therapy   Complete by: As directed    Call MD for:  difficulty breathing, headache or visual disturbances   Complete by: As directed    Call MD for:  extreme fatigue   Complete by: As directed    Call MD for:  hives   Complete by: As directed    Call MD for:  persistant dizziness or light-headedness   Complete by: As directed    Call MD for:  persistant nausea and vomiting   Complete by: As directed    Call MD for:  redness, tenderness, or signs of infection (pain, swelling, redness, odor or green/yellow discharge around incision site)   Complete by: As directed    Call MD for:  severe uncontrolled pain   Complete by: As directed    Call MD for:  temperature >100.4   Complete by: As directed    Diet - low sodium heart healthy   Complete by: As directed    Discharge instructions   Complete by: As directed    You were seen for cervical spine issues.  You've now had surgery with neurosurgery.  You should follow up with neurosurgery as an outpatient.  We'll arrange outpatient therapy.  You had Yida Hyams gout flare.  We'll place you on Haylie Mccutcheon steroid taper.    Return for new, recurrent, or worsening symptoms.  Please ask your PCP to request records from this hospitalization so they know what was done and what the next steps will be.   Discharge wound care:   Complete by: As directed    Per neurosurgery   Increase activity slowly   Complete by: As directed       Allergies as of 12/10/2023       Reactions   Tizanidine Hcl Anaphylaxis, Swelling   Allopurinol  Other (See Comments)   Elevated liver enzymes   Indomethacin  Nausea And Vomiting, Nausea Only, Swelling   Daypro [oxaprozin] Other (See Comments)   Tears his stomach up   Diclofenac Sodium Other (See Comments)   Dizzy    Prednisone  Other (See Comments)   Gets very mean        Medication List     STOP taking these medications    ibuprofen  200 MG tablet Commonly known as: ADVIL    methocarbamol  500 MG tablet Commonly known as: ROBAXIN    naproxen 500 MG tablet Commonly known as: NAPROSYN   predniSONE  10 MG (21) Tbpk tablet Commonly known as: STERAPRED UNI-PAK 21 TAB Replaced by: predniSONE  20 MG tablet       TAKE these medications    acetaminophen  500 MG tablet Commonly known as: TYLENOL  Take 1,000 mg by mouth every 6 (six) hours as needed for mild pain (pain score 1-3).   amphetamine -dextroamphetamine  20 MG tablet Commonly known as: ADDERALL Take 20 mg by mouth 2 (two) times daily.   baclofen 10 MG tablet Commonly known as: LIORESAL Take 10 mg by mouth 3 (three) times daily as needed (pain).   buPROPion  150 MG 24 hr tablet Commonly known  as: WELLBUTRIN  XL Take 150 mg by mouth every morning.   colchicine  0.6 MG tablet Take 1 tablet (0.6 mg total) by mouth 2 (two) times daily.   diazepam 5 MG tablet Commonly known as: VALIUM Take 0.5 tablets (2.5 mg total) by mouth every 12 (twelve) hours as needed for anxiety.   gabapentin  600 MG tablet Commonly known as: NEURONTIN  Take 600 mg by mouth 3 (three) times daily.   mirtazapine  30 MG tablet Commonly known as: REMERON  Take 1 tablet (30 mg total) by mouth at bedtime. What changed: when to take this   oxyCODONE  5 MG immediate release tablet Commonly known as: Roxicodone  Take 1 tablet (5 mg total) by mouth every 6 (six) hours as needed for up to 3 days for severe pain (pain score 7-10).   pantoprazole  40 MG tablet Commonly known as: Protonix  Take 1 tablet (40 mg total) by mouth daily.   predniSONE  20 MG tablet Commonly known as: DELTASONE  Take 2 tablets (40 mg  total) by mouth daily with breakfast for 2 days, THEN 1.5 tablets (30 mg total) daily with breakfast for 3 days, THEN 1 tablet (20 mg total) daily with breakfast for 3 days, THEN 0.5 tablets (10 mg total) daily with breakfast for 3 days. Start taking on: December 10, 2023 Replaces: predniSONE  10 MG (21) Tbpk tablet               Discharge Care Instructions  (From admission, onward)           Start     Ordered   12/10/23 0000  Discharge wound care:       Comments: Per neurosurgery   12/10/23 1246           Allergies  Allergen Reactions   Tizanidine Hcl Anaphylaxis and Swelling   Allopurinol  Other (See Comments)    Elevated liver enzymes   Indomethacin Nausea And Vomiting, Nausea Only and Swelling   Daypro [Oxaprozin] Other (See Comments)    Tears his stomach up   Diclofenac Sodium Other (See Comments)    Dizzy    Prednisone  Other (See Comments)    Gets very mean      The results of significant diagnostics from this hospitalization (including imaging, microbiology, ancillary and laboratory) are listed below for reference.    Significant Diagnostic Studies: DG Cervical Spine 2 or 3 views Result Date: 12/10/2023 CLINICAL DATA:  Postoperative evaluation. EXAM: CERVICAL SPINE - 2-3 VIEW COMPARISON:  12/08/2023. FINDINGS: Status post ACDF at C3-C4 and cervical disc arthroplasty at C5-C6. Normal alignment. Expected postoperative prevertebral soft tissue swelling. No acute abnormality. IMPRESSION: 1. Status post ACDF at C3-C4 and cervical disc arthroplasty at C5-C6. Electronically Signed   By: Harrietta Sherry M.D.   On: 12/10/2023 09:44   DG Cervical Spine 2 or 3 views Result Date: 12/10/2023 CLINICAL DATA:  [id number] Surgery, elective [id number] EXAM: CERVICAL SPINE - 2-3 VIEW COMPARISON:  12/08/2023. FINDINGS: Multiple intraoperative fluoroscopic spot images are provided. Interval ACDF at C3-C4 and cervical disc arthroplasty at C5-C6. Total fluoroscopy time: 1 minute 5  seconds Total dose: Radiation Exposure Index (as provided by the fluoroscopic device): 7.38 mGy air Kerma Please see intraoperative findings for further detail. IMPRESSION: Intraoperative fluoroscopy, as above. Interval ACDF at C3-C4 and cervical disc arthroplasty at C5-C6. Electronically Signed   By: Harrietta Sherry M.D.   On: 12/10/2023 09:43   DG C-Arm 1-60 Min-No Report Result Date: 12/09/2023 Fluoroscopy was utilized by the requesting physician.  No radiographic interpretation.  DG C-Arm 1-60 Min-No Report Result Date: 12/09/2023 Fluoroscopy was utilized by the requesting physician.  No radiographic interpretation.   DG C-Arm 1-60 Min-No Report Result Date: 12/09/2023 Fluoroscopy was utilized by the requesting physician.  No radiographic interpretation.   DG Cervical Spine With Flex & Extend Result Date: 12/08/2023 EXAM: FLEXION AND EXTENSION (2) VIEW(S) XRAY OF THE CERVICAL SPINE 12/08/2023 04:21:00 PM COMPARISON: CT 11/21/2023. CLINICAL HISTORY: 105500 Myelopathy (HCC) 105500 Myelopathy (HCC) FINDINGS: BONES: No acute fracture. No aggressive appearing osseous lesion. Alignment is normal. No dynamic instability on lateral flexion extension. Adjacent discogenic sclerosis in the vertebral bodies at C3-C4. DISCS AND DEGENERATIVE CHANGES: Moderate narrowing C3-C4 interspace. Mild narrowing C5-C6 with early endplate spurring. SOFT TISSUES: No prevertebral soft tissue swelling. The visualized lungs appear clear. Multiple missing teeth with dental implants. IMPRESSION: 1. Moderate narrowing of the C3-4 interspace with adjacent discogenic sclerosis in the vertebral bodies. 2. Mild narrowing of the C5-6 interspace with early endplate spurring. 3. No dynamic instability on lateral flexion-extension. Electronically signed by: Katheleen Faes MD 12/08/2023 06:06 PM EST RP Workstation: [id number]   MR Cervical Spine Wo Contrast Result Date: 12/04/2023 EXAM: MRI CERVICAL SPINE WITHOUT CONTRAST 12/04/2023  12:09:02 PM TECHNIQUE: Multiplanar multisequence MRI of the cervical spine was performed. COMPARISON: CT of the cervical spine dated 11/21/2023. CLINICAL HISTORY: Myelopathy, acute, cervical spine. FINDINGS: BONES AND ALIGNMENT: Straightening of the normal cervical lordosis. Normal vertebral body heights. Bone marrow signal is unremarkable. SPINAL CORD: Normal spinal cord size. At C5-C6, there is compression of the spinal cord which demonstrates mildly increased T2 signal. SOFT TISSUES: No paraspinal mass. C2-C3: No significant disc herniation. No spinal canal stenosis or neural foraminal narrowing. C3-C4: There is Mouna Yager left posterolateral disc osteophyte complex present, which is causing severe left-sided spinal canal stenosis and severe left neural foraminal stenosis. It is compressing the spinal cord on the left and is also likely compressing the left C4 nerve in the neural foramen. There is also moderate right-sided spinal canal and neural foraminal stenosis at this level. C4-C5: There is mild left facet hypertrophy contributing to mild left neural foraminal stenosis. The central spinal canal and right neural foramina are patent. C5-C6: There is Wilder Amodei bulging disc osteophyte complex and Delaynee Alred caudal central disc extrusion. There is severe spinal canal stenosis and compression of the spinal cord which demonstrates mildly increased T2 signal. There is mild-to-moderate bilateral neuroforaminal stenosis. C6-C7: No significant disc herniation. No spinal canal stenosis or neural foraminal narrowing. C7-T1: The spinal canal and neural foramina are patent. IMPRESSION: 1. Severe spinal canal stenosis and spinal cord compression at C5-6 due to Maybel Dambrosio bulging disc osteophyte complex and caudal central disc extrusion, with associated mildly increased T2 signal in the cord. Mild-to-moderate bilateral neuroforaminal stenosis at this level. 2. Severe left-sided spinal canal stenosis and severe left neural foraminal stenosis at C3-4 due to Alaisa Moffitt  left posterolateral disc osteophyte complex, resulting in left spinal cord compression and likely left C4 nerve compression. Moderate right-sided spinal canal and neural foraminal stenosis at this level. 3. Mild left neural foraminal stenosis at C4-5 due to mild left facet hypertrophy. Central canal and right neural foramina are patent. Electronically signed by: Evalene Coho MD 12/04/2023 12:54 PM EST RP Workstation: [id number]   CT ANGIO HEAD NECK W WO CM Result Date: 12/04/2023 EXAM: CTA HEAD AND NECK WITHOUT AND WITH 12/04/2023 08:52:17 AM TECHNIQUE: CTA of the head and neck was performed without and with the administration of 75 mL of iohexol  (OMNIPAQUE ) 350 MG/ML injection. Multiplanar  2D and/or 3D reformatted images are provided for review. Automated exposure control, iterative reconstruction, and/or weight based adjustment of the mA/kV was utilized to reduce the radiation dose to as low as reasonably achievable. Stenosis of the internal carotid arteries measured using NASCET criteria. COMPARISON: CT of the head dated 01/21/2020. CLINICAL HISTORY: severe right sided neck/head pain, ?traumatic. FINDINGS: CTA NECK: AORTIC ARCH AND ARCH VESSELS: No dissection or arterial injury. No significant stenosis of the brachiocephalic or subclavian arteries. CERVICAL CAROTID ARTERIES: Mild calcific plaque within the proximal right internal carotid artery, with 0% luminal stenosis. Mild calcific plaque present within the posterior wall of the origin left internal carotid artery, also with no associated luminal stenosis. No dissection, arterial injury, or hemodynamically significant stenosis by NASCET criteria. CERVICAL VERTEBRAL ARTERIES: The left vertebral artery is dominant and the right is relatively hypoplastic. Both vertebral arteries are uniform in caliber throughout their respective courses. No dissection, arterial injury, or significant stenosis. LUNGS AND MEDIASTINUM: Unremarkable. SOFT TISSUES: No acute  abnormality. BONES: No acute abnormality. CTA HEAD: ANTERIOR CIRCULATION: No significant stenosis of the internal carotid arteries. No significant stenosis of the anterior cerebral arteries. No significant stenosis of the middle cerebral arteries. No aneurysm. POSTERIOR CIRCULATION: No significant stenosis of the posterior cerebral arteries. No significant stenosis of the basilar artery. No significant stenosis of the vertebral arteries. No aneurysm. OTHER: No dural venous sinus thrombosis on this non-dedicated study. IMPRESSION: 1. No large vessel occlusion, hemodynamically significant stenosis, or aneurysm in the head or neck 2. Mild calcific plaque within the proximal right internal carotid artery and at the posterior wall of the left internal carotid artery origin, both with 0% luminal stenosis 3. Dominant left vertebral artery with relatively hypoplastic right vertebral artery Electronically signed by: Evalene Coho MD 12/04/2023 09:05 AM EST RP Workstation: [id number]   CT Chest Wo Contrast Result Date: 11/21/2023 CLINICAL DATA:  Blunt trauma to the chest. EXAM: CT CHEST WITHOUT CONTRAST TECHNIQUE: Multidetector CT imaging of the chest was performed following the standard protocol without IV contrast. RADIATION DOSE REDUCTION: This exam was performed according to the departmental dose-optimization program which includes automated exposure control, adjustment of the mA and/or kV according to patient size and/or use of iterative reconstruction technique. COMPARISON:  Chest CT dated 11/14/2022. FINDINGS: Evaluation of this exam is limited in the absence of intravenous contrast. Cardiovascular: There is no cardiomegaly or pericardial effusion. There is coronary vascular calcification. The thoracic aorta and central pulmonary arteries are grossly unremarkable on this noncontrast CT. Mediastinum/Nodes: No hilar or mediastinal adenopathy. The esophagus is grossly unremarkable. No mediastinal fluid collection.  Lungs/Pleura: No focal consolidation, pleural effusion or pneumothorax. The central airways are patent. Upper Abdomen: No acute abnormality. Musculoskeletal: No acute osseous pathology. IMPRESSION: 1. No acute intrathoracic pathology. 2. Coronary vascular calcification. Electronically Signed   By: Vanetta Chou M.D.   On: 11/21/2023 19:32   CT Cervical Spine Wo Contrast Result Date: 11/21/2023 EXAM: CT CERVICAL SPINE WITHOUT CONTRAST 11/21/2023 07:23:49 PM TECHNIQUE: CT of the cervical spine was performed without the administration of intravenous contrast. Multiplanar reformatted images are provided for review. Automated exposure control, iterative reconstruction, and/or weight based adjustment of the mA/kV was utilized to reduce the radiation dose to as low as reasonably achievable. COMPARISON: None available. CLINICAL HISTORY: Neck trauma, midline tenderness (Age 70-64y) FINDINGS: CERVICAL SPINE: BONES AND ALIGNMENT: No acute fracture or traumatic malalignment. DEGENERATIVE CHANGES: Moderate degenerative changes at the C3-C4 level. No associated severe osseous neural foraminal or central canal stenosis. SOFT  TISSUES: No prevertebral soft tissue swelling. IMPRESSION: 1. No acute abnormality of the cervical spine. Electronically signed by: Kate Plummer MD 11/21/2023 07:28 PM EDT RP Workstation: [id number]   DG Shoulder Right Result Date: 11/21/2023 CLINICAL DATA:  Right shoulder pain.  Fall 6 days ago. EXAM: RIGHT SHOULDER - 2+ VIEW COMPARISON:  Humerus CT 03/04/2023 FINDINGS: There is no evidence of fracture or dislocation. Mild glenohumeral and acromioclavicular degenerative change. Soft tissues are unremarkable. IMPRESSION: No fracture or dislocation of the right shoulder. Mild degenerative change. Electronically Signed   By: Andrea Gasman M.D.   On: 11/21/2023 18:28    Microbiology: Recent Results (from the past 240 hours)  Surgical pcr screen     Status: None   Collection Time: 12/09/23  12:11 PM   Specimen: Nasal Mucosa; Nasal Swab  Result Value Ref Range Status   MRSA, PCR NEGATIVE NEGATIVE Final   Staphylococcus aureus NEGATIVE NEGATIVE Final    Comment: (NOTE) The Xpert SA Assay (FDA approved for NASAL specimens in patients 61 years of age and older), is one component of Madeline Bebout comprehensive surveillance program. It is not intended to diagnose infection nor to guide or monitor treatment. Performed at [facility], [address] [place], [place] [address]      Labs: Basic Metabolic Panel: Recent Labs  Lab 12/04/23 0743 12/06/23 2052 12/07/23 0526 12/09/23 0434 12/10/23 0418  NA 139 135 137 133* 134*  K 4.0 3.8 3.9 3.7 4.1  CL 108 99 102 98 100  CO2  --  24 25 26 24   GLUCOSE 123* 102* 97 95 121*  BUN 25* 19 19 13 15   CREATININE 1.30* 0.90 0.92 1.06 0.97  CALCIUM   --  8.8* 8.4* 8.7* 8.3*  MG  --   --  2.6* 2.5* 2.7*  PHOS  --   --  3.5 3.3 2.9   Liver Function Tests: Recent Labs  Lab 12/07/23 0526 12/09/23 0434 12/10/23 0418  AST 97* 73* 73*  ALT 94* 72* 66*  ALKPHOS 92 60 53  BILITOT 1.5* 2.3* 1.3*  PROT 6.2* 6.7 6.6  ALBUMIN 4.0 3.2* 2.9*   No results for input(s): LIPASE, AMYLASE in the last 168 hours. No results for input(s): AMMONIA in the last 168 hours. CBC: Recent Labs  Lab 12/04/23 0743 12/06/23 2052 12/07/23 0526 12/09/23 0434 12/10/23 0418  WBC  --  8.1 6.4 9.2 8.8  NEUTROABS  --  5.7  --  6.6 7.2  HGB 17.0 16.0 14.6 14.8 13.3  HCT 50.0 44.4 41.3 41.9 37.6*  MCV  --  90.4 92.4 90.9 92.4  PLT  --  111* 98* 90* 132*   Cardiac Enzymes: No results for input(s): CKTOTAL, CKMB, CKMBINDEX, TROPONINI in the last 168 hours. BNP: BNP (last 3 results) No results for input(s): BNP in the last 8760 hours.  ProBNP (last 3 results) No results for input(s): PROBNP in the last 8760 hours.  CBG: Recent Labs  Lab 12/09/23 1141 12/09/23 1656  GLUCAP 92 122*       Signed:  Meliton Monte MD.  [facility] 12/10/2023, 12:53 PM

## 2023-12-10 NOTE — Discharge Instructions (Addendum)
 Wound Care No dressing is required over your incision. Keep clean and dry. You can shower 24 hours after surgery. Let water run over incision. Do not scrub. Pat dry Do not put any creams, lotions, or ointments on incision. Do not submerge your incision for the first 6 weeks (pool, ocean, etc)  Activity Walk each and every day, increasing distance each day. No lifting greater than 10 lbs.  Avoid excessive back/neck motion.  Diet Resume your normal diet.  Call Your Doctor If Any of These Occur Redness, drainage, or swelling at the wound.  Temperature greater than 101 degrees. Severe pain not relieved by pain medication. Incision starts to come apart.  Please refrain from smoking as it will increase your risk of postoperative complications  Please avoid NSAID's including ibuprofen , naproxen  Follow Up Appt Call [phone] today for appointment in 6 weeks if you don't already have one or for any problems.  If you have any hardware placed in your spine, you will need an x-ray before your appointment.

## 2023-12-10 NOTE — Progress Notes (Signed)
 Assessment 58 y/o M w/ hx smoking, alcohol abuse who presents with acute on chronic cervical myelopathy, C3-4 degenerative stenosis with severe central stenosis, C5-6 acute disc herniation, with severe central stenosis. Underwent C3-4 ACDF C5-6 CDA on 11/17  LOS: 4 days    Plan: AAT DAT Ok for DVT ppx on 11/19. Pt appeared to have worsening thrombocytopenia in association with use of SQH - defer to primary team Ok for discharge from nsgy standpoint   Subjective: Pt states his BUE/BLE sensation feels improved. No longer complaining of the right radiculopathy  Objective: Vital signs in last 24 hours: Temp:  [97.6 F (36.4 C)-98.7 F (37.1 C)] 97.9 F (36.6 C) (11/18 0900) Pulse Rate:  [49-90] 51 (11/18 0900) Resp:  [16-20] 18 (11/18 0900) BP: (98-130)/(59-102) 110/63 (11/18 0900) SpO2:  [91 %-100 %] 100 % (11/18 0900) Weight:  [104.3 kg] 104.3 kg (11/17 1133)  Intake/Output from previous day: 11/17 0701 - 11/18 0700 In: 2713 [I.V.:1029.5; Blood:1583.5; IV Piggyback:100] Out: 725 [Urine:700; Blood:25] Intake/Output this shift: No intake/output data recorded.  Exam: RUE: 4/5 deltoid, 4/5 bicep, 4/5 wrist extension, 4/5 tricep, 4/5 grip LUE: 5/5 deltoid, 4/5 bicep, 5/5 wrist extension, 4/5 tricep, 5/5 grip RLE: 5/5 IP, 5/5 quad, 5/5 ham, 5/5 DF, 5/5 EHL, 5/5 PF LLE: 5/5 IP, 5/5 quad, 5/5 ham, 5/5 DF, 5/5 EHL, 5/5 PF Subjective numbness b/l hands Incision c/d/I, flat  Lab Results: Recent Labs    12/09/23 0434 12/10/23 0418  WBC 9.2 8.8  HGB 14.8 13.3  HCT 41.9 37.6*  PLT 90* 132*   BMET Recent Labs    12/09/23 0434 12/10/23 0418  NA 133* 134*  K 3.7 4.1  CL 98 100  CO2 26 24  GLUCOSE 95 121*  BUN 13 15  CREATININE 1.06 0.97  CALCIUM  8.7* 8.3*    Studies/Results: DG Cervical Spine 2 or 3 views Result Date: 12/10/2023 CLINICAL DATA:  Postoperative evaluation. EXAM: CERVICAL SPINE - 2-3 VIEW COMPARISON:  12/08/2023. FINDINGS: Status post ACDF at C3-C4 and  cervical disc arthroplasty at C5-C6. Normal alignment. Expected postoperative prevertebral soft tissue swelling. No acute abnormality. IMPRESSION: 1. Status post ACDF at C3-C4 and cervical disc arthroplasty at C5-C6. Electronically Signed   By: Harrietta Sherry M.D.   On: 12/10/2023 09:44   DG Cervical Spine 2 or 3 views Result Date: 12/10/2023 CLINICAL DATA:  [id number] Surgery, elective [id number] EXAM: CERVICAL SPINE - 2-3 VIEW COMPARISON:  12/08/2023. FINDINGS: Multiple intraoperative fluoroscopic spot images are provided. Interval ACDF at C3-C4 and cervical disc arthroplasty at C5-C6. Total fluoroscopy time: 1 minute 5 seconds Total dose: Radiation Exposure Index (as provided by the fluoroscopic device): 7.38 mGy air Kerma Please see intraoperative findings for further detail. IMPRESSION: Intraoperative fluoroscopy, as above. Interval ACDF at C3-C4 and cervical disc arthroplasty at C5-C6. Electronically Signed   By: Harrietta Sherry M.D.   On: 12/10/2023 09:43   DG C-Arm 1-60 Min-No Report Result Date: 12/09/2023 Fluoroscopy was utilized by the requesting physician.  No radiographic interpretation.   DG C-Arm 1-60 Min-No Report Result Date: 12/09/2023 Fluoroscopy was utilized by the requesting physician.  No radiographic interpretation.   DG C-Arm 1-60 Min-No Report Result Date: 12/09/2023 Fluoroscopy was utilized by the requesting physician.  No radiographic interpretation.   DG Cervical Spine With Flex & Extend Result Date: 12/08/2023 EXAM: FLEXION AND EXTENSION (2) VIEW(S) XRAY OF THE CERVICAL SPINE 12/08/2023 04:21:00 PM COMPARISON: CT 11/21/2023. CLINICAL HISTORY: 105500 Myelopathy (HCC) 105500 Myelopathy (HCC) FINDINGS: BONES: No acute fracture. No  aggressive appearing osseous lesion. Alignment is normal. No dynamic instability on lateral flexion extension. Adjacent discogenic sclerosis in the vertebral bodies at C3-C4. DISCS AND DEGENERATIVE CHANGES: Moderate narrowing C3-C4 interspace. Mild  narrowing C5-C6 with early endplate spurring. SOFT TISSUES: No prevertebral soft tissue swelling. The visualized lungs appear clear. Multiple missing teeth with dental implants. IMPRESSION: 1. Moderate narrowing of the C3-4 interspace with adjacent discogenic sclerosis in the vertebral bodies. 2. Mild narrowing of the C5-6 interspace with early endplate spurring. 3. No dynamic instability on lateral flexion-extension. Electronically signed by: Katheleen Faes MD 12/08/2023 06:06 PM EST RP Workstation: [id number]      Dorn JONELLE Glade 12/10/2023, 10:11 AM

## 2023-12-10 NOTE — Plan of Care (Signed)

## 2023-12-10 NOTE — Progress Notes (Signed)
 Transition of Care [facility]) - Inpatient Brief Assessment   Patient Details  Name: Brandon Miller MRN: [id number] Date of Birth: Jan 03, 1966  Transition of Care [facility]) CM/SW Contact:    Rosaline JONELLE Joe, RN Phone Number: 12/10/2023, 10:58 AM   Clinical Narrative: CM met with the patient at the bedside to discuss IP Care management needs.  Patient lives at home with his sister and plans to return home when stable.  Patient is S/P neurosurgery for C 3-4 ACDF, and C 5-6.  Patient is pending PT/OT evaluation.  Patient plans to return home when stable.    Patient is current smoker and ETOH use.  Resources provided in the AVS.  Patient was counseled on both.   Transition of Care Asessment: Insurance and Status: (P) Insurance coverage has been reviewed Patient has primary care physician: (P) Yes Home environment has been reviewed: (P) from home with sister Prior level of function:: (P) self Prior/Current Home Services: (P) No current home services Social Drivers of Health Review: (P) SDOH reviewed needs interventions Readmission risk has been reviewed: (P) Yes Transition of care needs: (P) transition of care needs identified, TOC will continue to follow

## 2023-12-10 NOTE — Evaluation (Signed)
 Physical Therapy Re-Evaluation Patient Details Name: Brandon Miller MRN: [id number] DOB: 10-25-1965 Today's Date: 12/10/2023  History of Present Illness  Pt is a 58 y.o. M who presents 12/06/2023 with worsening neck pain. Pt endorses sustaining a fall about 3 weeks ago in the bathroom and denies hitting his head. MRI of cervical spine shows C3-C4 severe DDD and central stenosis due to left paracentric disc osteophyte and C5-6 severe stenosis.  Underwent CC3-4 ACDF and C5-6 Disc arthroplasty on 12/09/23.  Significant PMH: anxiety, depression, tobacco/ETOH abuse.  Clinical Impression  Patient post cervical surgery as noted above able to mobilize with supervision to CGA in hallway.  Still mild imbalance though improving throughout session.  Patient denies increased symptoms in hands and arms with ambulation though still with L elbow gout pain.  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  PT will continue to follow while in the acute setting.       If plan is discharge home, recommend the following: A little help with walking and/or transfers;Help with stairs or ramp for entrance   Can travel by private vehicle        Equipment Recommendations None recommended by PT  Recommendations for Other Services       Functional Status Assessment Patient has had a recent decline in their functional status and demonstrates the ability to make significant improvements in function in a reasonable and predictable amount of time.     Precautions / Restrictions Precautions Precautions: Fall;Cervical Precaution Booklet Issued: Yes (comment) Recall of Precautions/Restrictions: Intact      Mobility  Bed Mobility Overal bed mobility: Needs Assistance Bed Mobility: Rolling, Sidelying to Sit, Sit to Sidelying Rolling: Supervision Sidelying to sit: Supervision      Sit to sidelying: Supervision General bed mobility comments: cues for technique post cervical surgery    Transfers Overall transfer level: Needs assistance Equipment used: None Transfers: Sit to/from Stand Sit to Stand: Contact guard assist           General transfer comment: initial support for safety    Ambulation/Gait Ambulation/Gait assistance: Supervision Gait Distance (Feet): 200 Feet Assistive device: None Gait Pattern/deviations: Step-through pattern, Decreased stride length, Wide base of support       General Gait Details: mild instability, though improved during ambulation in hallway, appropriate arm swing and denies increased symptoms in arms or hands  Stairs            Wheelchair Mobility     Tilt Bed    Modified Rankin (Stroke Patients Only)       Balance Overall balance assessment: Needs assistance   Sitting balance-Leahy Scale: Good     Standing balance support: No upper extremity supported Standing balance-Leahy Scale: Good                               Pertinent Vitals/Pain Pain Assessment Pain Score: 8  Pain Location: L elbow and neck Pain Descriptors / Indicators: Aching, Discomfort Pain Intervention(s): Monitored during session, Repositioned (reports had meds earlier today)    Home Living Family/patient expects to be discharged to:: Private residence Living Arrangements: Other relatives;Children (sister) Available Help at Discharge: Family;Available PRN/intermittently Type of Home: House Home Access: Level entry       Home Layout: One level Home Equipment: Agricultural Consultant (2 wheels);Cane - single point;Crutches  Prior Function Prior Level of Function : Independent/Modified Independent               ADLs Comments: Independent, 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)     Extremity/Trunk Assessment   Upper Extremity Assessment Upper Extremity  Assessment: Overall WFL for tasks assessed RUE Deficits / Details: AROM WFL, strength at least 3+/5 LUE Deficits / Details: mild L elbow limitation due to gout, shoulder WFL, did not test shoulder strength post cervical surgery    Lower Extremity Assessment Lower Extremity Assessment: Overall WFL for tasks assessed    Cervical / Trunk Assessment Cervical / Trunk Assessment: Neck Surgery  Communication   Communication Communication: No apparent difficulties    Cognition Arousal: Alert Behavior During Therapy: Flat affect   PT - Cognitive impairments: No apparent impairments                         Following commands: Intact       Cueing Cueing Techniques: Verbal cues     General Comments General comments (skin integrity, edema, etc.): issued handout and reviewed cervical precautions post surgery; no brace noted in room nor order in chart    Exercises     Assessment/Plan    PT Assessment Patient needs continued PT services  PT Problem List Decreased balance;Decreased mobility;Decreased activity tolerance       PT Treatment Interventions Gait training;Stair training;Functional mobility training;Therapeutic activities;Therapeutic exercise;Balance training;Neuromuscular re-education;Patient/family education    PT Goals (Current goals can be found in the Care Plan section)  Acute Rehab PT Goals Patient Stated Goal: To go home PT Goal Formulation: With patient Time For Goal Achievement: 12/24/23 Potential to Achieve Goals: Good    Frequency Min 2X/week     Co-evaluation               AM-PAC PT 6 Clicks Mobility  Outcome Measure Help needed turning from your back to your side while in a flat bed without using bedrails?: None Help needed moving from lying on your back to sitting on the side of a flat bed without using bedrails?: A Little Help needed moving to and from a bed to a chair (including a wheelchair)?: A Little Help needed standing up  from a chair using your arms (e.g., wheelchair or bedside chair)?: None Help needed to walk in hospital room?: A Little Help needed climbing 3-5 steps with a railing? : A Little 6 Click Score: 20    End of Session Equipment Utilized During Treatment: Gait belt Activity Tolerance: Patient tolerated treatment well Patient left: in bed;with call bell/phone within reach   PT Visit Diagnosis: Other abnormalities of gait and mobility (R26.89);Unsteadiness on feet (R26.81);History of falling (Z91.81)    Time: 8889-8871 PT Time Calculation (min) (ACUTE ONLY): 18 min   Charges:   PT Evaluation $PT Re-evaluation: 1 Re-eval   PT General Charges $$ ACUTE PT VISIT: 1 Visit         Micheline Portal, PT Acute Rehabilitation Services Office:[phone] 12/10/2023   Montie Portal 12/10/2023, 12:48 PM

## 2023-12-12 MED FILL — Hydromorphone HCl Preservative Free (PF) Inj 2 MG/ML: INTRAMUSCULAR | Qty: 1 | Status: AC

## 2023-12-13 ENCOUNTER — Encounter (HOSPITAL_COMMUNITY): Payer: Self-pay

## 2023-12-13 LAB — TYPE AND SCREEN
ABO/RH(D): B POS
Antibody Screen: NEGATIVE
Unit division: 0
Unit division: 0

## 2023-12-13 LAB — BPAM RBC
Blood Product Expiration Date: 202512052359
Blood Product Expiration Date: 202512052359
Unit Type and Rh: 7300
Unit Type and Rh: 7300

## 2023-12-25 ENCOUNTER — Encounter (INDEPENDENT_AMBULATORY_CARE_PROVIDER_SITE_OTHER): Payer: Self-pay | Admitting: *Deleted

## 2024-01-24 ENCOUNTER — Ambulatory Visit (HOSPITAL_COMMUNITY): Attending: Family Medicine
